# Patient Record
Sex: Female | Born: 1950
Health system: Southern US, Community
[De-identification: ages and names within clinical notes are randomized; demographics above are authoritative.]

## PROBLEM LIST (undated history)

## (undated) DIAGNOSIS — M79672 Pain in left foot: Secondary | ICD-10-CM

## (undated) DIAGNOSIS — F419 Anxiety disorder, unspecified: Secondary | ICD-10-CM

## (undated) DIAGNOSIS — M542 Cervicalgia: Secondary | ICD-10-CM

## (undated) DIAGNOSIS — E785 Hyperlipidemia, unspecified: Secondary | ICD-10-CM

## (undated) DIAGNOSIS — R112 Nausea with vomiting, unspecified: Secondary | ICD-10-CM

## (undated) DIAGNOSIS — Z9889 Other specified postprocedural states: Secondary | ICD-10-CM

## (undated) DIAGNOSIS — Z923 Personal history of irradiation: Secondary | ICD-10-CM

## (undated) DIAGNOSIS — G5602 Carpal tunnel syndrome, left upper limb: Secondary | ICD-10-CM

## (undated) DIAGNOSIS — M1990 Unspecified osteoarthritis, unspecified site: Secondary | ICD-10-CM

## (undated) DIAGNOSIS — M79671 Pain in right foot: Secondary | ICD-10-CM

## (undated) DIAGNOSIS — G8929 Other chronic pain: Secondary | ICD-10-CM

## (undated) DIAGNOSIS — M19011 Primary osteoarthritis, right shoulder: Secondary | ICD-10-CM

## (undated) DIAGNOSIS — M25519 Pain in unspecified shoulder: Secondary | ICD-10-CM

## (undated) DIAGNOSIS — K219 Gastro-esophageal reflux disease without esophagitis: Secondary | ICD-10-CM

## (undated) DIAGNOSIS — J309 Allergic rhinitis, unspecified: Secondary | ICD-10-CM

## (undated) DIAGNOSIS — E119 Type 2 diabetes mellitus without complications: Secondary | ICD-10-CM

## (undated) DIAGNOSIS — C50919 Malignant neoplasm of unspecified site of unspecified female breast: Secondary | ICD-10-CM

## (undated) DIAGNOSIS — R911 Solitary pulmonary nodule: Secondary | ICD-10-CM

## (undated) DIAGNOSIS — F329 Major depressive disorder, single episode, unspecified: Secondary | ICD-10-CM

## (undated) DIAGNOSIS — F319 Bipolar disorder, unspecified: Secondary | ICD-10-CM

## (undated) DIAGNOSIS — R51 Headache: Secondary | ICD-10-CM

## (undated) DIAGNOSIS — F32A Depression, unspecified: Secondary | ICD-10-CM

## (undated) DIAGNOSIS — I10 Essential (primary) hypertension: Secondary | ICD-10-CM

## (undated) DIAGNOSIS — C3490 Malignant neoplasm of unspecified part of unspecified bronchus or lung: Secondary | ICD-10-CM

## (undated) HISTORY — DX: Hyperlipidemia, unspecified: E78.5

## (undated) HISTORY — PX: TONSILLECTOMY: SUR1361

## (undated) HISTORY — DX: Solitary pulmonary nodule: R91.1

## (undated) HISTORY — DX: Malignant neoplasm of unspecified site of unspecified female breast: C50.919

## (undated) HISTORY — PX: EYE SURGERY: SHX253

## (undated) HISTORY — PX: TUBAL LIGATION: SHX77

## (undated) HISTORY — DX: Depression, unspecified: F32.A

## (undated) HISTORY — DX: Gastro-esophageal reflux disease without esophagitis: K21.9

## (undated) HISTORY — DX: Type 2 diabetes mellitus without complications: E11.9

## (undated) HISTORY — PX: OTHER SURGICAL HISTORY: SHX169

## (undated) HISTORY — DX: Allergic rhinitis, unspecified: J30.9

## (undated) HISTORY — DX: Bipolar disorder, unspecified: F31.9

## (undated) HISTORY — DX: Major depressive disorder, single episode, unspecified: F32.9

## (undated) HISTORY — DX: Anxiety disorder, unspecified: F41.9

---

## 2002-08-14 DIAGNOSIS — C50919 Malignant neoplasm of unspecified site of unspecified female breast: Secondary | ICD-10-CM

## 2002-08-14 DIAGNOSIS — Z923 Personal history of irradiation: Secondary | ICD-10-CM

## 2002-08-14 HISTORY — DX: Personal history of irradiation: Z92.3

## 2002-08-14 HISTORY — DX: Malignant neoplasm of unspecified site of unspecified female breast: C50.919

## 2003-08-15 HISTORY — PX: BREAST SURGERY: SHX581

## 2005-03-16 ENCOUNTER — Emergency Department (HOSPITAL_COMMUNITY): Admission: EM | Admit: 2005-03-16 | Discharge: 2005-03-16 | Payer: Self-pay | Admitting: Emergency Medicine

## 2005-06-07 ENCOUNTER — Ambulatory Visit: Payer: Self-pay | Admitting: *Deleted

## 2006-05-14 ENCOUNTER — Ambulatory Visit: Payer: Self-pay | Admitting: Gastroenterology

## 2006-05-29 ENCOUNTER — Ambulatory Visit: Payer: Self-pay | Admitting: Gastroenterology

## 2006-05-29 ENCOUNTER — Ambulatory Visit (HOSPITAL_COMMUNITY): Admission: RE | Admit: 2006-05-29 | Discharge: 2006-05-29 | Payer: Self-pay | Admitting: Gastroenterology

## 2006-09-07 ENCOUNTER — Emergency Department (HOSPITAL_COMMUNITY): Admission: EM | Admit: 2006-09-07 | Discharge: 2006-09-07 | Payer: Self-pay | Admitting: Emergency Medicine

## 2006-09-13 ENCOUNTER — Inpatient Hospital Stay (HOSPITAL_COMMUNITY): Admission: AD | Admit: 2006-09-13 | Discharge: 2006-09-17 | Payer: Self-pay | Admitting: Family Medicine

## 2006-09-20 ENCOUNTER — Ambulatory Visit: Payer: Self-pay | Admitting: Gastroenterology

## 2006-09-21 ENCOUNTER — Ambulatory Visit: Payer: Self-pay | Admitting: Cardiovascular Disease

## 2006-09-28 ENCOUNTER — Encounter (INDEPENDENT_AMBULATORY_CARE_PROVIDER_SITE_OTHER): Payer: Self-pay | Admitting: *Deleted

## 2006-09-28 ENCOUNTER — Ambulatory Visit (HOSPITAL_COMMUNITY): Admission: RE | Admit: 2006-09-28 | Discharge: 2006-09-28 | Payer: Self-pay | Admitting: Gastroenterology

## 2006-09-28 ENCOUNTER — Ambulatory Visit: Payer: Self-pay | Admitting: Gastroenterology

## 2006-10-18 ENCOUNTER — Ambulatory Visit: Payer: Self-pay | Admitting: Gastroenterology

## 2007-03-14 ENCOUNTER — Encounter (HOSPITAL_COMMUNITY): Admission: RE | Admit: 2007-03-14 | Discharge: 2007-04-13 | Payer: Self-pay | Admitting: Oncology

## 2007-03-14 ENCOUNTER — Ambulatory Visit (HOSPITAL_COMMUNITY): Payer: Self-pay | Admitting: Oncology

## 2007-05-02 ENCOUNTER — Ambulatory Visit (HOSPITAL_COMMUNITY): Admission: RE | Admit: 2007-05-02 | Discharge: 2007-05-02 | Payer: Self-pay | Admitting: Orthopaedic Surgery

## 2007-05-29 ENCOUNTER — Ambulatory Visit: Payer: Self-pay | Admitting: Gastroenterology

## 2007-08-15 DIAGNOSIS — R911 Solitary pulmonary nodule: Secondary | ICD-10-CM

## 2007-08-15 HISTORY — DX: Solitary pulmonary nodule: R91.1

## 2007-09-21 ENCOUNTER — Emergency Department (HOSPITAL_COMMUNITY): Admission: EM | Admit: 2007-09-21 | Discharge: 2007-09-21 | Payer: Self-pay | Admitting: Emergency Medicine

## 2007-09-25 ENCOUNTER — Ambulatory Visit (HOSPITAL_COMMUNITY): Admission: RE | Admit: 2007-09-25 | Discharge: 2007-09-25 | Payer: Self-pay | Admitting: Internal Medicine

## 2007-12-03 ENCOUNTER — Ambulatory Visit (HOSPITAL_COMMUNITY): Admission: RE | Admit: 2007-12-03 | Discharge: 2007-12-03 | Payer: Self-pay | Admitting: Family Medicine

## 2007-12-04 ENCOUNTER — Ambulatory Visit: Payer: Self-pay | Admitting: Cardiovascular Disease

## 2007-12-04 ENCOUNTER — Ambulatory Visit (HOSPITAL_COMMUNITY): Admission: RE | Admit: 2007-12-04 | Discharge: 2007-12-04 | Payer: Self-pay | Admitting: Family Medicine

## 2007-12-12 ENCOUNTER — Encounter (HOSPITAL_COMMUNITY): Admission: RE | Admit: 2007-12-12 | Discharge: 2008-01-11 | Payer: Self-pay | Admitting: Cardiovascular Disease

## 2007-12-12 ENCOUNTER — Ambulatory Visit: Payer: Self-pay | Admitting: Cardiology

## 2008-01-09 ENCOUNTER — Ambulatory Visit: Payer: Self-pay | Admitting: Gastroenterology

## 2008-06-18 ENCOUNTER — Ambulatory Visit: Payer: Self-pay | Admitting: Gastroenterology

## 2008-06-23 ENCOUNTER — Ambulatory Visit (HOSPITAL_COMMUNITY): Admission: RE | Admit: 2008-06-23 | Discharge: 2008-06-23 | Payer: Self-pay | Admitting: Family Medicine

## 2008-07-02 ENCOUNTER — Ambulatory Visit (HOSPITAL_COMMUNITY): Admission: RE | Admit: 2008-07-02 | Discharge: 2008-07-02 | Payer: Self-pay | Admitting: Family Medicine

## 2008-07-14 ENCOUNTER — Ambulatory Visit (HOSPITAL_COMMUNITY): Admission: RE | Admit: 2008-07-14 | Discharge: 2008-07-14 | Payer: Self-pay | Admitting: Family Medicine

## 2008-07-24 ENCOUNTER — Ambulatory Visit (HOSPITAL_COMMUNITY): Admission: RE | Admit: 2008-07-24 | Discharge: 2008-07-24 | Payer: Self-pay | Admitting: Family Medicine

## 2008-07-29 ENCOUNTER — Ambulatory Visit: Payer: Self-pay | Admitting: Thoracic Surgery

## 2008-09-30 ENCOUNTER — Emergency Department (HOSPITAL_COMMUNITY): Admission: EM | Admit: 2008-09-30 | Discharge: 2008-09-30 | Payer: Self-pay | Admitting: Emergency Medicine

## 2008-10-28 ENCOUNTER — Ambulatory Visit (HOSPITAL_COMMUNITY): Admission: RE | Admit: 2008-10-28 | Discharge: 2008-10-28 | Payer: Self-pay | Admitting: Thoracic Surgery

## 2008-10-28 ENCOUNTER — Ambulatory Visit: Payer: Self-pay | Admitting: Thoracic Surgery

## 2008-12-15 DIAGNOSIS — K219 Gastro-esophageal reflux disease without esophagitis: Secondary | ICD-10-CM

## 2008-12-15 DIAGNOSIS — C50919 Malignant neoplasm of unspecified site of unspecified female breast: Secondary | ICD-10-CM | POA: Insufficient documentation

## 2008-12-15 DIAGNOSIS — K2990 Gastroduodenitis, unspecified, without bleeding: Secondary | ICD-10-CM

## 2008-12-15 DIAGNOSIS — D259 Leiomyoma of uterus, unspecified: Secondary | ICD-10-CM | POA: Insufficient documentation

## 2008-12-15 DIAGNOSIS — E785 Hyperlipidemia, unspecified: Secondary | ICD-10-CM

## 2008-12-15 DIAGNOSIS — F419 Anxiety disorder, unspecified: Secondary | ICD-10-CM

## 2008-12-15 DIAGNOSIS — R143 Flatulence: Secondary | ICD-10-CM

## 2008-12-15 DIAGNOSIS — E7800 Pure hypercholesterolemia, unspecified: Secondary | ICD-10-CM

## 2008-12-15 DIAGNOSIS — R109 Unspecified abdominal pain: Secondary | ICD-10-CM | POA: Insufficient documentation

## 2008-12-15 DIAGNOSIS — R141 Gas pain: Secondary | ICD-10-CM | POA: Insufficient documentation

## 2008-12-15 DIAGNOSIS — R142 Eructation: Secondary | ICD-10-CM

## 2008-12-15 DIAGNOSIS — K2970 Gastritis, unspecified, without bleeding: Secondary | ICD-10-CM | POA: Insufficient documentation

## 2008-12-15 DIAGNOSIS — K5900 Constipation, unspecified: Secondary | ICD-10-CM | POA: Insufficient documentation

## 2008-12-15 DIAGNOSIS — Z8719 Personal history of other diseases of the digestive system: Secondary | ICD-10-CM | POA: Insufficient documentation

## 2008-12-15 DIAGNOSIS — F329 Major depressive disorder, single episode, unspecified: Secondary | ICD-10-CM | POA: Insufficient documentation

## 2008-12-16 ENCOUNTER — Ambulatory Visit: Payer: Self-pay | Admitting: Gastroenterology

## 2009-02-09 ENCOUNTER — Ambulatory Visit: Payer: Self-pay | Admitting: Internal Medicine

## 2009-03-10 ENCOUNTER — Ambulatory Visit: Payer: Self-pay | Admitting: Gastroenterology

## 2009-03-11 DIAGNOSIS — K649 Unspecified hemorrhoids: Secondary | ICD-10-CM | POA: Insufficient documentation

## 2009-08-16 ENCOUNTER — Encounter (INDEPENDENT_AMBULATORY_CARE_PROVIDER_SITE_OTHER): Payer: Self-pay | Admitting: *Deleted

## 2009-09-07 ENCOUNTER — Ambulatory Visit: Payer: Self-pay | Admitting: Gastroenterology

## 2009-09-08 ENCOUNTER — Encounter (INDEPENDENT_AMBULATORY_CARE_PROVIDER_SITE_OTHER): Payer: Self-pay | Admitting: *Deleted

## 2009-09-20 ENCOUNTER — Encounter (INDEPENDENT_AMBULATORY_CARE_PROVIDER_SITE_OTHER): Payer: Self-pay | Admitting: *Deleted

## 2009-09-27 ENCOUNTER — Telehealth (INDEPENDENT_AMBULATORY_CARE_PROVIDER_SITE_OTHER): Payer: Self-pay | Admitting: *Deleted

## 2009-10-15 ENCOUNTER — Encounter: Payer: Self-pay | Admitting: Gastroenterology

## 2009-10-26 ENCOUNTER — Ambulatory Visit: Payer: Self-pay | Admitting: Gastroenterology

## 2009-10-26 ENCOUNTER — Ambulatory Visit (HOSPITAL_COMMUNITY): Admission: RE | Admit: 2009-10-26 | Discharge: 2009-10-26 | Payer: Self-pay | Admitting: Gastroenterology

## 2009-11-17 ENCOUNTER — Ambulatory Visit: Payer: Self-pay | Admitting: Gastroenterology

## 2010-01-05 ENCOUNTER — Ambulatory Visit (HOSPITAL_COMMUNITY): Admission: RE | Admit: 2010-01-05 | Discharge: 2010-01-05 | Payer: Self-pay | Admitting: Family Medicine

## 2010-01-12 ENCOUNTER — Ambulatory Visit (HOSPITAL_COMMUNITY): Admission: RE | Admit: 2010-01-12 | Discharge: 2010-01-12 | Payer: Self-pay | Admitting: Family Medicine

## 2010-04-07 ENCOUNTER — Emergency Department (HOSPITAL_COMMUNITY): Admission: EM | Admit: 2010-04-07 | Discharge: 2010-04-07 | Payer: Self-pay | Admitting: Emergency Medicine

## 2010-04-27 ENCOUNTER — Ambulatory Visit: Payer: Self-pay | Admitting: Gastroenterology

## 2010-04-27 DIAGNOSIS — K625 Hemorrhage of anus and rectum: Secondary | ICD-10-CM

## 2010-09-04 ENCOUNTER — Encounter: Payer: Self-pay | Admitting: Family Medicine

## 2010-09-04 ENCOUNTER — Encounter: Payer: Self-pay | Admitting: Thoracic Surgery

## 2010-09-04 ENCOUNTER — Encounter (HOSPITAL_COMMUNITY): Payer: Self-pay | Admitting: Oncology

## 2010-09-13 NOTE — Letter (Signed)
Summary: Appointment Reminder  [facility]  [address]   [place], [place] [address]   Phone: [phone]  Fax: [phone]       August 16, 2009   Deborah Mullins [address] [place], [place]  [address] September 15, 1950    Dear [name],  We have been unable to reach you by phone to schedule a follow up   appointment that was recommended for you by Dr. Darrick Penna. It is very   important that we reach you to schedule an appointment. We hope that you  allow [place] to participate in your health care needs. Please contact us at  [phone] at your earliest convenience to schedule your appointment.  Sincerely,    [facility] R. Roetta Sessions, M.D.    Kassie Mends, M.D. Lorenza Burton, FNP-BC    Tana Coast, PA-C Phone: [phone]    Fax: [phone]

## 2010-09-13 NOTE — Assessment & Plan Note (Signed)
Summary: RECTAL BLEEDING   Visit Type:  Follow-up Visit Primary Care Deborah Mullins:  Deborah Mullins, M.D.   Chief Complaint:  hemorrhoids/BRBPR.  History of Present Illness: Has bleeding 1-2x and it was a little. No pain in belly. Gained weight. Having fatigue and a little depression. Significant other passed away in APR 01/09/2010. L knee bothers her. No hard stools. Lifting heavy items out in the yard. JUST RECYCLED 300 LBS AND GOT $30.  Current Medications (verified): 1)  Dulcolax Stool Softener 100 Mg Caps (Docusate Sodium) .... Take 1 Capsule By Mouth At Bedtime 2)  Zantac 150 Mg Tabs (Ranitidine Hcl) .... Take 1 Tablet By Mouth Once A Day 3)  B 6 .... Take 1 Tablet By Mouth Once A Day 4)  Paroxetine Hcl 30 Mg Tabs (Paroxetine Hcl) .... Take 2 Daily 5)  Simvastatin 40 Mg Tabs (Simvastatin) .... Take 1 Tablet By Mouth Once A Day 6)  Hydrochlorothiazide 25 Mg Tabs (Hydrochlorothiazide) .... Once Daily 7)  Vitamin C Cr 500 Mg Cr-Caps (Ascorbic Acid) .... Once Daily 8)  Calcium Plus Vitamin D 600-100 Mg-Unit Caps (Calcium Carbonate-Vitamin D) .... Once Daily 9)  Vicodin 5-500 Mg Tabs (Hydrocodone-Acetaminophen) .... As Needed 10)  Melatonin 3 Mg Tabs (Melatonin) .... At Bedtime As Needed 11)  Dexilant 60 Mg Cpdr (Dexlansoprazole) .... Take 1 Tablet By Mouth Once A Day  Allergies (verified): 1)  ! Pcn  Past History:  Past Medical History: Constipation **TCS 01/09/06 Gastritis **EGD 01/10/07 External hemorrhoids Internal Hemorrhoids **Endoscopic hemorrhoidal banding-MAR 2011 RLL mass: BENIGN    - CT Chest 10/29/07, 07/14/08, 12/04/07 becoming much more dense serially    - neg PET 12/09 GERD Fibroids Depression  Vital Signs:  Patient profile:   60 year old female Height:      64 inches Weight:      148.50 pounds BMI:     25.58 Temp:     98.0 degrees F oral Pulse rate:   80 / minute BP sitting:   120 / 82  (left arm) Cuff size:   regular  Vitals Entered By: Cloria Spring LPN (April 27, 2010 4:09 PM)  Physical Exam  General:  Well developed, well nourished, no acute distress. Head:  Normocephalic and atraumatic. Eyes:  PERRL, no icterus. Lungs:  Clear throughout to auscultation. Heart:  Regular rate and rhythm; no murmurs. Abdomen:  Soft, nontender and nondistended. Normal bowel sounds.  Impression & Recommendations:  Problem # 1:  RECTAL BLEEDING (ICD-569.3) Assessment Deteriorated  Having breakthrough bleeding from hemorrhoids due to heavylifting and also c/o fatigue. Anusol HC 1 pr two times a day for 10 days. Avoid constipation and heavylifting. CBC. OPV in APR or MAY 2011-01-10. If has significant bleeding, would proceed with surgey referral.  CC: PCP  Orders: T-CBC w/Diff ([id number]) Prescriptions: ANUSOL-HC 25 MG SUPP (HYDROCORTISONE ACETATE) 1 pr two times a day for 10 days  #20 x 1   Entered and Authorized by:   West Bali MD   Signed by:   West Bali MD on 04/27/2010   Method used:   Electronically to        [facility] [phone]* (retail)       [address]       [place], [place]  [address]       Ph: [phone] or [phone]       Fax: [phone]   RxID:   [id number]   Appended Document: RECTAL BLEEDING F/U  OPV APRIL 2012 IS IN THE COMPUTER  Appended Document: Orders Update    Clinical Lists Changes  Orders: Added new Service order of Est. Patient Level IV ([address]) - Signed

## 2010-09-13 NOTE — Letter (Signed)
Summary: [facility]  [facility]  [address]   [place], [place] [address]   Phone: [phone]  Fax: [phone]     September 20, 2009   [facility] [address] [place], [place]  [address] 05-Jul-1951  Dear Deborah Mullins,  During your last appointment, your doctor requested you have A Flex Sigmoidoscopy.  Our records indicate you have not had this done.  Remember it is very important to follow your doctor's instructions.  Please have this done as soon as possible.  If you have questions regarding this appointment, please call our office and we can reschedule this for you.  It is important that patients and their doctor work together in the management and treatment of their health care.  If you have already had your test done, please disregard this letter.  Thank you,    Ave Filter  [facility] Ph: [phone]   Fax: [phone]

## 2010-09-13 NOTE — Letter (Signed)
Summary: Scheduled Appointment  [facility]  [address]   [place], [place] [address]   Phone: [phone]  Fax: [phone]    September 08, 2009   Dear: [name]            DOB: 09/10/1950    I have been instructed to schedule you an appointment in our office.  Your appointment is as follows:   Date: FEB 22,2011     Time: 230 PM     Please be here 15 minutes early.   Provider: DR FIELDS    Please contact the office if you need to reschedule this appointment for a more convenient time.   Thank you,    [facility] Ph: [phone]   Fax: [phone]

## 2010-09-13 NOTE — Letter (Signed)
Summary: FLEX SIG ORDER  FLEX SIG ORDER   Imported By: Ave Filter 10/15/2009 12:08:34  _____________________________________________________________________  External Attachment:    Type:   Image     Comment:   External Document

## 2010-09-13 NOTE — Assessment & Plan Note (Signed)
Summary: hemorrhoids/rectal bleeding   Visit Type:  Follow-up Visit Primary Care Provider:  Almedia Balls, NP-C  Chief Complaint:  follow up.  History of Present Illness: Husband is real sick, bad liver and kidneys. Cared [facility] at [facility] Didn't have surgery because they determined she didn't have cancer. No straining with BMs. Bleeding and swollen hemorrhoids and pushes back in but still painful. Seeing BRBPR 1-2x/week. Pain inrectum: 1-2x/week. Dealing with 'rhoids for over 1 year and feels not responding to suppositories.  Rhoids don't fall out and stay out all the time. No ASA, BC, or Goodys, Ibuprofen, Motrin, or Aleve. VICODIN binds her up.  Current Medications (verified): 1)  Dulcolax Stool Softener 100 Mg Caps (Docusate Sodium) .... Take 1 Capsule By Mouth At Bedtime 2)  Zantac 150 Mg Tabs (Ranitidine Hcl) .... Take 1 Tablet By Mouth Once A Day 3)  B 6 .... Take 1 Tablet By Mouth Once A Day 4)  Multivitamins  Tabs (Multiple Vitamin) .... Take 1 Tablet By Mouth Once A Day 5)  Paroxetine Hcl 30 Mg Tabs (Paroxetine Hcl) .... Take 2 Daily 6)  Simvastatin 40 Mg Tabs (Simvastatin) .... Take 1 Tablet By Mouth Once A Day 7)  Kapidex 60 Mg Cpdr (Dexlansoprazole) .... Once Daily 8)  Hydrochlorothiazide 25 Mg Tabs (Hydrochlorothiazide) .... Once Daily 9)  Vitamin C Cr 500 Mg Cr-Caps (Ascorbic Acid) .... Once Daily 10)  Calcium Plus Vitamin D 600-100 Mg-Unit Caps (Calcium Carbonate-Vitamin D) .... Once Daily 11)  Cvs Gas Relief 125 Mg Caps (Simethicone) .... As Needed 12)  Vicodin 5-500 Mg Tabs (Hydrocodone-Acetaminophen) .... As Needed 13)  Melatonin 3 Mg Tabs (Melatonin) .... At Bedtime  Allergies: 1)  ! Pcn  Past History:  Past Medical History: RLL mass: BENIGN    - CT Chest 10/29/07, 07/14/08, 12/04/07 becoming much more dense serially    - neg PET 12/09 GERD Fibroids Constipation Depression Gastritis-EGD 2008 TCS 2007  Vital Signs:  Patient profile:   60 year old  female Height:      64 inches Weight:      158 pounds BMI:     27.22 Temp:     97.5 degrees F oral Pulse rate:   80 / minute BP sitting:   124 / 88  (left arm) Cuff size:   regular  Vitals Entered By: Hendricks Limes LPN (September 07, 2009 10:57 AM)  Physical Exam  General:  Well developed, well nourished, no acute distress. Head:  Normocephalic and atraumatic. Lungs:  Clear throughout to auscultation. Heart:  Regular rate and rhythm; no murmurs Abdomen:  Soft, nontender and nondistended. Normal bowel sounds. Rectal:  Grade I-II hemorrhoids. Multiple external hemorrhoids, no erythema, or edema.  Impression & Recommendations:  Problem # 1:  HEMORRHOIDS (ICD-455.6) Assessment Deteriorated Continued blleding in spite of medical theray. Pt has Grade I-II internal hemorrhoids. Plan FSig/hemorrhoidal banding next week. Explained procedure to pt and need for lighter sedation than a TCS because a pain assessment needs to be done prior to placing each band. Pt also aware it may take more than one session. OPV in  1 month.  CC: PCP  Other Orders: Est. Patient Level IV (53664)  Appended Document: hemorrhoids/rectal bleeding Pt is ready to schedule her procedure and states no change in her medicine or medical history since her office visit 09/07/09.  Appended Document: hemorrhoids/rectal bleeding Need s procedure on 3/18.

## 2010-09-13 NOTE — Assessment & Plan Note (Signed)
Summary: HEMORRHOIDAL BANDING   Visit Type:  Follow-up Visit Primary Care Provider:  Logan Bores, NP-c  Chief Complaint:  follow up from procedure.  History of Present Illness: Right after procedure: a little bleeding after band fell off. No bleeding since. No rectal pain, abd pain. Appetite: good.  BMs: good, 1-2x every AM. Husband in hospital throwing up blood at [facility]. Preceded by epistaxis.   Current Medications (verified): 1)  Dulcolax Stool Softener 100 Mg Caps (Docusate Sodium) .... Take 1 Capsule By Mouth At Bedtime 2)  Zantac 150 Mg Tabs (Ranitidine Hcl) .... Take 1 Tablet By Mouth Once A Day 3)  B 6 .... Take 1 Tablet By Mouth Once A Day 4)  Multivitamins  Tabs (Multiple Vitamin) .... Take 1 Tablet By Mouth Once A Day 5)  Paroxetine Hcl 30 Mg Tabs (Paroxetine Hcl) .... Take 2 Daily 6)  Simvastatin 40 Mg Tabs (Simvastatin) .... Take 1 Tablet By Mouth Once A Day 7)  Kapidex 60 Mg Cpdr (Dexlansoprazole) .... Once Daily 8)  Hydrochlorothiazide 25 Mg Tabs (Hydrochlorothiazide) .... Once Daily 9)  Vitamin C Cr 500 Mg Cr-Caps (Ascorbic Acid) .... Once Daily 10)  Calcium Plus Vitamin D 600-100 Mg-Unit Caps (Calcium Carbonate-Vitamin D) .... Once Daily 11)  Cvs Gas Relief 125 Mg Caps (Simethicone) .... As Needed 12)  Vicodin 5-500 Mg Tabs (Hydrocodone-Acetaminophen) .... As Needed 13)  Melatonin 3 Mg Tabs (Melatonin) .... At Bedtime  Allergies (verified): 1)  ! Pcn  Past History:  Past Medical History: Constipation External hemorrhoids Internal Hemorrhoids **Endoscopic hemorrhoidal banding-MAR 2011 RLL mass: BENIGN    - CT Chest 10/29/07, 07/14/08, 12/04/07 becoming much more dense serially    - neg PET 12/09 GERD Fibroids Depression Gastritis-EGD 2008 TCS 2007  Social History: Former smoker.  Quit in 1980.  Smoked 1 ppd x 15 years. Smokes marijuana daily No ETOH Widowed  Children Retired  Vital Signs:  Patient profile:   60 year old female Height:      64  inches Weight:      156 pounds BMI:     26.87 Temp:     98.2 degrees F oral Pulse rate:   64 / minute BP sitting:   138 / 92  (left arm) Cuff size:   regular  Vitals Entered By: Hendricks Limes LPN (November 18, 1322 8:28 AM)  Physical Exam  General:  Well developed, well nourished, no acute distress. Head:  Normocephalic and atraumatic. Mouth:  No deformity or lesions. Lungs:  Clear throughout to auscultation. Heart:  Regular rate and rhythm; no murmurs. Abdomen:  Soft, nontender and nondistended. Normal bowel sounds.  Impression & Recommendations:  Problem # 1:  HEMORRHOIDS (ICD-455.6) Assessment Improved  Will monitor for recurrent bleeding. If persistent rectal pain will need surgery referral for hemorrhoidectomy. OPV in 6 mos.  CC: PCP  Orders: Est. Patient Level II (40102)

## 2010-09-13 NOTE — Progress Notes (Signed)
  Phone Note Call from Patient   Summary of Call: Pt would like for you to call her back in the morning around 10am. [phone] or [phone]...she is wanting to schedule her procedure on the 18th this month. Initial call taken by: Diana Eves,  September 27, 2009 3:49 PM     Appended Document:  I called pt, no answer, [facility]

## 2010-10-04 ENCOUNTER — Encounter (INDEPENDENT_AMBULATORY_CARE_PROVIDER_SITE_OTHER): Payer: Self-pay | Admitting: *Deleted

## 2010-10-11 NOTE — Letter (Signed)
Summary: Recall Office Visit  [facility]  [address]   [place], [place] [address]   Phone: [phone]  Fax: [phone]      October 04, 2010   [facility] [address] [place], [place]  [address] 09/25/1950   Dear Deborah Mullins,   According to our records, it is time for you to schedule a follow-up office visit with [place].   At your convenience, please call [phone] to schedule an office visit. If you have any questions, concerns, or feel that this letter is in error, we would appreciate your call.   Sincerely,    Diana Eves  [facility] Ph: [phone]   Fax: [phone]

## 2010-10-26 ENCOUNTER — Emergency Department (HOSPITAL_COMMUNITY)
Admission: EM | Admit: 2010-10-26 | Discharge: 2010-10-27 | Disposition: A | Discharge status: Home / Self Care | Payer: No Typology Code available for payment source | Attending: Emergency Medicine | Admitting: Emergency Medicine

## 2010-10-26 DIAGNOSIS — K5289 Other specified noninfective gastroenteritis and colitis: Secondary | ICD-10-CM | POA: Insufficient documentation

## 2010-10-26 DIAGNOSIS — R112 Nausea with vomiting, unspecified: Secondary | ICD-10-CM | POA: Insufficient documentation

## 2010-10-27 LAB — BASIC METABOLIC PANEL
Calcium: 9.8 mg/dL (ref 8.4–10.5)
GFR calc Af Amer: >60 mL/min (ref >60)
GFR calc non Af Amer: 59 mL/min — ABNORMAL LOW (ref >60)
Glucose, Bld: 122 mg/dL — ABNORMAL HIGH (ref 70–99)
Sodium: 139 mEq/L (ref 135–145)

## 2010-10-27 LAB — URINALYSIS, ROUTINE W REFLEX MICROSCOPIC
Leukocytes, UA: NEGATIVE
Nitrite: NEGATIVE
Specific Gravity, Urine: >1.03 — ABNORMAL HIGH (ref 1.005–1.030)
Urobilinogen, UA: 0.2 mg/dL (ref 0.0–1.0)

## 2010-10-27 LAB — CBC
HCT: 42 % (ref 36.0–46.0)
Platelets: 274 10*3/uL (ref 150–400)
RDW: 13.5 % (ref 11.5–15.5)
WBC: 7.3 10*3/uL (ref 4.0–10.5)

## 2010-10-27 LAB — DIFFERENTIAL
Basophils Absolute: 0 10*3/uL (ref 0.0–0.1)
Eosinophils Relative: 0 % (ref 0–5)
Lymphocytes Relative: 13 % (ref 12–46)

## 2010-10-27 LAB — URINE MICROSCOPIC-ADD ON

## 2010-11-08 ENCOUNTER — Telehealth: Payer: Self-pay | Admitting: Gastroenterology

## 2010-11-08 DIAGNOSIS — K625 Hemorrhage of anus and rectum: Secondary | ICD-10-CM

## 2010-11-08 NOTE — Telephone Encounter (Signed)
Pt called wanting to schedule a TCS ASAP- She is having significant rectal bleeding and thinks it is coming from her hemorrhoids. Her sister will be in town until next WED so she would like to have this done on Monday or Tuesday. Is it ok to schedule her without having her come in for an office visit? Please advise

## 2010-11-15 NOTE — Telephone Encounter (Signed)
Called pt's  Home #: not a working number. Called cell phone: no VM set up yet. Pt called back. Pt  Having rectal bleeding for 1-3 mos. Nothing tried. Has been gardening. Sitting digging, planting flowers-lilies, and weeding. Pt needs General Surgery referral (Jenkins/Ziegler)-Dx; hemorrhoids, ASAP. Pt needs CBC. BMs: soft. At first straining to pass stool. Pt will restart stool softener. Pt already has Anusol HC 1 pr 1-2 times for 7 days. Don't sit on toilet for prolonged. If has urge to have a BM, don't wait. OPV in 3 mos, RE: rectal bleeding.

## 2010-11-15 NOTE — Telephone Encounter (Signed)
Pt has an appt with Dr Leticia Penna 11/17/10@1 :45pm..the patient is aware of appt.

## 2010-11-15 NOTE — Telephone Encounter (Signed)
Order faxed to lab.

## 2010-11-16 NOTE — Telephone Encounter (Signed)
Reminder in epic for fu in 3 months with [facility]

## 2010-12-06 ENCOUNTER — Emergency Department (HOSPITAL_COMMUNITY)
Admission: EM | Admit: 2010-12-06 | Discharge: 2010-12-07 | Disposition: A | Discharge status: Home / Self Care | Payer: No Typology Code available for payment source | Attending: Emergency Medicine | Admitting: Emergency Medicine

## 2010-12-06 DIAGNOSIS — R51 Headache: Secondary | ICD-10-CM | POA: Insufficient documentation

## 2010-12-06 DIAGNOSIS — Z853 Personal history of malignant neoplasm of breast: Secondary | ICD-10-CM | POA: Insufficient documentation

## 2010-12-06 DIAGNOSIS — E7800 Pure hypercholesterolemia, unspecified: Secondary | ICD-10-CM | POA: Insufficient documentation

## 2010-12-06 DIAGNOSIS — R070 Pain in throat: Secondary | ICD-10-CM | POA: Insufficient documentation

## 2010-12-06 DIAGNOSIS — I10 Essential (primary) hypertension: Secondary | ICD-10-CM | POA: Insufficient documentation

## 2010-12-06 DIAGNOSIS — J069 Acute upper respiratory infection, unspecified: Secondary | ICD-10-CM | POA: Insufficient documentation

## 2010-12-06 DIAGNOSIS — R05 Cough: Secondary | ICD-10-CM | POA: Insufficient documentation

## 2010-12-06 DIAGNOSIS — R059 Cough, unspecified: Secondary | ICD-10-CM | POA: Insufficient documentation

## 2010-12-06 DIAGNOSIS — J45909 Unspecified asthma, uncomplicated: Secondary | ICD-10-CM | POA: Insufficient documentation

## 2010-12-06 DIAGNOSIS — K219 Gastro-esophageal reflux disease without esophagitis: Secondary | ICD-10-CM | POA: Insufficient documentation

## 2010-12-06 DIAGNOSIS — Z79899 Other long term (current) drug therapy: Secondary | ICD-10-CM | POA: Insufficient documentation

## 2010-12-06 DIAGNOSIS — J3489 Other specified disorders of nose and nasal sinuses: Secondary | ICD-10-CM | POA: Insufficient documentation

## 2010-12-27 NOTE — Assessment & Plan Note (Signed)
NAME:  Deborah Mullins              CHART#:  [id number]   DATE:  06/18/2008                       DOB:  [birth year]   REFERRING PHYSICIAN:  Margarita Mail, PA-C.   PROBLEM LIST:  1. EGD February 2008-Gastritis  2. Gastroesophageal reflux disease.  3. Breast cancer treated with mastectomy, 33 treatments XRT in 2004.  4. Uterine fibroids.  5. Depression.  6. Constipation.  7. TCS 2007-normal   SUBJECTIVE:  The patient is a 60 year old female who has had problems  with constipation and bright red blood per rectum twice.  Her  hemorrhoids were very painful and engorged.  The symptoms were about a  month ago.  She used Anusol-HC and they got better.  She has been trying  to eat more fresh vegetables and fruit because her husband has been ill  and as a result, she has had more bloating.  She does use 1 Dulcolax at  nighttime to help with bowel movements.  She takes MiraLax when I  remember.  She did have a CT scan back in April, which showed some  vague densities in the right lower lobe and some low-density lesion of  the liver, spleen, and adnexal cyst.  She is scheduled to have a CT scan  in November 2009 and ultrasound in December 2009.   MEDICATIONS:  1. Prilosec 20 mg daily.  2. Stool softener.  3. Dulcolax 1 nightly.  4. Lunesta as needed.  5. Zantac 150 mg.  6. Enalapril.  7. B6.  8. Multivitamin.  9. Paroxetine.  10.Simvastatin.   OBJECTIVE/PHYSICAL EXAM:  VITAL SIGNS:  Weight 152 pounds (up 16 pounds  since May 2009), height 5 feet 3 inches, temperature 97.9, blood  pressure 118/86, and pulse 60.GENERAL:  She is in no apparent distress.  Alert and oriented x4.LUNGS:  Clear to auscultation  bilaterally.CARDIOVASCULAR:  Regular rhythm.  No murmur.  Normal S1 and  S2.ABDOMEN:  Bowel sounds are present, soft, nontender, nondistended.   ASSESSMENT:  The patient is a 60 year old female who has had a  hemorrhoidal flare, it is now resolved.  Thank you for allowing me to  see  the patient in consultation.  My recommendations follow.   RECOMMENDATIONS:  1. She is given Anusol-HC suppositories to use every 12 hours for 10      days if she has a flare.  She has no refills.  If she continues to      require Anusol-HC more than 3-4 times a year, then would recommend      hemorrhoidectomy.  2. Follow up appointment in 6 months.       Kassie Mends, M.D.  Electronically Signed     SM/MEDQ  D:  06/18/2008  T:  06/18/2008  Job:  [id number]   cc:   Patrica Duel, M.D.

## 2010-12-27 NOTE — Assessment & Plan Note (Signed)
[facility]                       [facility] OFFICE NOTE   Scherrie Gerlach                    MRN:          [id number]  DATE:12/04/2007                            DOB:          1950/10/30    HISTORY OF PRESENT ILLNESS:  [name] is seen today for yearly follow-  up and also referred back from Jimmy Footman and Dr. Sherwood Gambler.  The patient  has been having atypical chest pain.   She seems a bit frustrated.  She has been ill since pretty much  September 2008.  Since I last saw her, she has had continue shoulder  pain.  She had carpal tunnel surgery on the right arm by Dr. Hilda Lias  with minimal improvement.  She has had subsequent gastritis and Dr.  Cira Servant has done colonoscopy and upper EGD.  She is on Zantac.  She has  had a URI since February.  She has been on antibiotics, Mucinex and  other therapies and still feels that she has a cough and congestion.  There is no marked sputum production.  She apparently has had a chest x-  ray which did not show much.  She used to have a cortisol level and  chest CT.   She denies smoking, but has smoked pot in the past.   She has had some atypical chest pain, it is likely related to her cough  and URA that she has had for over a month.  It is sharp pain.  It is  under left breast.  She has had a previous mastectomy.  It is not  necessarily exertional.  There is no pleuritic component.  There is no  associated diaphoresis, syncope or palpitations.  She has had a  nonischemic Myoview in 2005 which was done for atypical chest pain.   Coronary risk factors include hypertension, recently treated and  hyperlipidemia also recently treated.   MEDICATIONS:  1. Vytorin 10/40.  2. Zantac 75 a day.  3. MiraLax.  4. Vasotec 10 a day.  5. Paxil 30 a day.  6. Vitamin B6.  7. Calcium.   PHYSICAL EXAMINATION:  GENERAL:  Exam is remarkable for bronchitic  appearing middle-aged white female with color care.  VITAL SIGNS:  Weight is 132, blood pressure is 120/80 pulse 76 and  regular, respiratory rate 16, afebrile.  HEENT:  Unremarkable.  NECK:  Carotids are without bruit.  No lymphadenopathy, thyromegaly, JVP  elevation.  LUNGS:  Clear with good diaphragmatic motion.  No wheezing.  HEART:  S1-S2 normal heart sounds.  PMI normal, status post mastectomy.  ABDOMEN:  Benign bowel sounds positive.  No AAA.  No bruit.  No  hepatosplenomegaly or hepatojugular reflux.  EXTREMITIES:  Distal pulses are intact.  No edema.  NEUROLOGICAL:  Nonfocal.  SKIN:  Warm and dry.  No muscle weakness.   STUDIES:  EKG is essentially normal.   IMPRESSION:  1. Atypical chest pain.  Follow-up stress Myoview next week.  The      patient has a bunch of other appointments this week that she needs      to keep.  2. Hypercholesterolemia.  Continue Vytorin 10/40, follow-up lipid and      liver profile in 6 months.  3. Hypertension, recently treated with Vasotec, continue low-salt      diet, blood pressure in good range.  4. History of gastritis.  Continue Zantac.  Follow-up with Dr. Cira Servant.  5. Patient's chest pain is atypical.  She needs to follow up with Dr.      Sherwood Gambler and Jimmy Footman regarding this prolonged QRI.  I did mention      to her that I thought that she had a lot of smoke on her clothes.      She said that it is smoking in the household and that she is      currently not smoking, but at some point it may be worthwhile to      check a nicotine level.     Noralyn Pick. Eden Emms, MD, [facility]  Electronically Signed    PCN/MedQ  DD: 12/04/2007  DT: 12/04/2007  Job #: [id number]

## 2010-12-27 NOTE — H&P (Signed)
NAME:  Deborah Mullins             ACCOUNT NO.:  [id number]   MEDICAL RECORD NO.:  [id number]          PATIENT TYPE:  AMB   LOCATION:  DAY                           FACILITY:  [facility]   PHYSICIAN:  J. Darreld Mclean, M.D. DATE OF BIRTH:  [birth year]   DATE OF ADMISSION:  DATE OF DISCHARGE:  LH                              HISTORY & PHYSICAL   CHIEF COMPLAINT:  Carpal tunnel.   Patient is a 60 year old female with pain and tenderness in both hands,  right greater than left.  She has had pain and tenderness in both hands  for several years.  This got progressively worse.  She saw Dr. Regino Schultze  recently and he had Dr. Benson Setting see the patient.  EMGs were done showing  bilateral carpal tunnel syndrome, moderate to severe on the right and  mild on the left.  She has not improved at all with conservative  treatment over time including anti-inflammatories, rest and splints.  She now desires carpal tunnel release.  I have explained to her the  risks and imponderables of the procedure. She appears to understand and  agrees to procedure as outlined.   PAST MEDICAL HISTORY:  Positive for  1. Hypertension.  2. Breast cancer.  3. Ulcer disease.   ALLERGIES:  She is allergic to PENICILLIN,   MEDICATIONS:  She takes  1. Vytorin 10/40 daily.  2. Clonidine 0.2 nightly.  3. Ambien CR 12.5 nightly.  4. Ranitidine 150 mg daily.  5. Loratadine nightly.  6. Simethicone.  7. ProAir.   She does not smoke and does not drink.   PAST SURGICAL HISTORY:  1. Status post C-section.  2. Tubal ligation.  3. Breast cancer surgery in 2004 with no residual.  4. Colonoscopy.   [place]  She is a widow.   FAMILY HISTORY:  Heart disease runs in the family.   PHYSICAL EXAMINATION:  VITAL SIGNS:  BP is 110/74, pulse 72, respiration  16, afebrile.  Height 5 feet 5 inches. 140 pounds.  GENERAL APPEARANCE:  Patient is alert, cooperative, and oriented.  HEENT:  Negative.  NECK:  Supple.  LUNGS:  Clear to P&A.  HEART:  Regular rhythm without murmur heard.  ABDOMEN:  Soft, nontender without masses.  EXTREMITIES:  Positive Phalen's, positive Tinel's on the right.  Positive Phalen's on the left and weakly positive Tinel's on the left  and positive.  She decreased sensation in medial and lateral  distribution of both hands, right greater than left.  CNS: Otherwise intact.  SKIN:  Intact.   IMPRESSION:  Bilateral carpal tunnel syndrome, right greater than left.   PLAN:  Open release of the right carpal tunnel volar carpal ligament.  Discussed with patient the planned procedure.  Labs are pending.                                            ______________________________  J. Darreld Mclean, M.D.     JWK/MEDQ  D:  05/01/2007  T:  05/01/2007  Job:  [id number]

## 2010-12-27 NOTE — Assessment & Plan Note (Signed)
NAMEMarland Mullins  [name]              CHART#:  [id number]   DATE:  01/09/2008                       DOB:  [birth year]   REFERRING PHYSICIAN:  Kirk Ruths, M.D.   PROBLEM LIST:  1. Gastritis on biopsy and EGD of February 2008.  2. Gastroesophageal reflux disease.  3. Breast cancer treated with mastectomy and 33 treatments of      radiation in 2004.  4. Uterine fibroid.  5. Depression.  6. Constipation.   SUBJECTIVE:  [name] is a 60 year old female who presents as a  return patient visit.  She was last seen in the clinic in October of  2008.  She was complaining of constipation and having painful  hemorrhoids.  In February of 2008 she began to have persistent left  breast and chest pain.  She was seen by cardiology and evaluated and  says that her stress test was fine.  She also saw Madelin Rear. Sherwood Gambler,  M.D. and Jimmy Footman, who ordered a CT scan and MRI and she had two cysts  on her spleen and her liver.  She said they had planned to repeat an  imaging study in 6 months.  She described a discomfort in her chest that  sounded like there was a gas bubble underneath her left breast.  At the  time she was taking 75 mg of Ranitidine daily.  She denies any nausea  and vomiting or problems swallowing.  Sometimes she does have shortness  of breath on exertion.  Her stress test did show that she is physically  deconditioned and has decreased exercise tolerance.  She is no longer  having pain in her chest after adding Ranitidine 150 mg once a day.   MEDICATIONS:  1. Vytorin.  2. Prilosec as needed.  3. MiraLax once everyday or every other day.  4. Stool softener at bedtime according to how hard her stools are.  5. Lunesta.  6. Zantac 150 mg daily.  7. Enalapril.  8. B6.  9. Multivitamin.  10.Paroxetine 30 mg daily.   OBJECTIVE:  VITAL SIGNS:  Weight 136 pounds (down 7 pounds since October  of 2008), height 5 feet 3 inches, BMI 24.1 (healthy), temperature 97.7,  blood  pressure 124/84, pulse 76.  GENERAL:  She is in no apparent  distress.  Alert and oriented x4.  LUNGS:  Clear to auscultation  bilaterally. CARDIOVASCULAR:  Regular rhythm with no murmur. ABDOMEN:  Bowel sounds are present, soft, nontender, and nondistended.  EXTREMITIES:  No cyanosis or edema.  NEUROLOGY:  She has no focal  neurologic deficits.   ASSESSMENT:  [name] is a 60 year old female who had intermittent  left chest discomfort in February which was likely secondary to  uncontrolled gastroesophageal reflux disease.  Thank you for allowing me  to see [name] in consultation.  My recommendations follow.   RECOMMENDATIONS:  1. I recommend that she switch from Ranitidine to omeprazole once      daily 30 minutes before breakfast to have more adequate control of      her GERD symptoms.  2. She has a follow-up appointment to see me in 6 months.       Kassie Mends, M.D.  Electronically Signed     SM/MEDQ  D:  01/09/2008  T:  01/09/2008  Job:  [id number]   cc:  Kirk Ruths, M.D.

## 2010-12-27 NOTE — Assessment & Plan Note (Signed)
NAMEMarland Mullins  [name]              CHART#:  [id number]   DATE:  05/29/2007                       DOB:  02/19/1951   REFERRING PHYSICIAN:  Karleen Hampshire, M.D.   PROBLEM LIST:  1. Chronic gastritis with abnormal CT scan in January 2008. EGD in      February 2008 showed gastritis with biopsies.  2. History of breast cancer treated with mastectomy and 33 treatments      of radiation in 2004.  3. Hyperlipidemia.  4. Gastroesophageal reflux disease.  5. Uterine fibroid.  6. Depression.  7. Chronic constipation.   SUBJECTIVE:  The patient is a 60 year old female who presents for her  return patient visit.  She recently had carpal tunnel surgery and was on  Codeine.  The Codeine bound her up.  She is now on Darvocet.  She  had a stool softener and MiraLax and now the constipation has been  relieved.  She is back to taking ibuprofen.  The constipation made her  hemorrhoids flare.  They are painful, but not necessarily bleeding.   MEDICATIONS:  1. Vytorin daily.  2. Darvocet as needed.  3. Mylanta as needed.  4. MiraLax as needed.  5. Ambien as needed.  6. Acid reducer 1-2 at bedtime.  7. Stool softener.  8. Clonidine 0.2 mg   OBJECTIVE:  Weight 143 pounds (up 5 pounds since March 2008), height 5  feet 3 inches.  Temperature 98.2, blood pressure 98/60, and pulse 80.  GENERAL:  She is in no apparent distress.  Alert and oriented x4.  LUNGS:  Clear to auscultation bilaterally.  CARDIOVASCULAR EXAM:  Regular rhythm.  No murmurs, normal S1, S2.  ABDOMEN:  Bowel sounds are present.  Soft, nontender, nondistended.  No  rebound or guarding.  EXTREMITIES:  She has a splint on her right wrist, hand and forearm.  NEURO:  She has no focal neurologic deficits.   ASSESSMENT:  The patient is a 60 year old female who has a hemorrhoidal  flare secondary to constipation induced by narcotics.  She has a history  of NSAID gastritis and is currently using antiinflammatory drugs.  She  also has  gastroesophageal reflux disease, which is controlled.  Thank  you for allowing me to see this patient in consultation.  My  recommendations follow.   RECOMMENDATIONS:  1. She was given her discharge instructions in writing.  She was asked      to avoid straining.  She was asked to add MiraLax twice daily.  She      was asked to drink at least 6-8 cups of water or juice a day.  2. She was asked to use Anusol-HC twice a day for 14 days.  She should      also add Colace 2-3 times a day to soften her stools.  3. She has a return patient visit to see me in 6 months.       Kassie Mends, M.D.  Electronically Signed     SM/MEDQ  D:  05/29/2007  T:  05/30/2007  Job:  [id number]   cc:   Tilda Burrow, M.D.  Kirk Ruths, M.D.

## 2010-12-27 NOTE — Op Note (Signed)
NAME:  Deborah Mullins             ACCOUNT NO.:  [id number]   MEDICAL RECORD NO.:  [id number]          PATIENT TYPE:  AMB   LOCATION:  DAY                           FACILITY:  [facility]   PHYSICIAN:  J. Darreld Mclean, M.D. DATE OF BIRTH:  [birth year]   DATE OF PROCEDURE:  05/02/2007  DATE OF DISCHARGE:                               OPERATIVE REPORT   PREOPERATIVE DIAGNOSIS:  Carpal tunnel syndrome, right.   POSTOPERATIVE DIAGNOSIS:  Carpal tunnel syndrome, right.   PROCEDURE:  Open release of right volar carpal ligament, saline  neurolysis epineurotomy right median nerve.   ANESTHESIA:  Bier block.   SURGEON:  J. Darreld Mclean, M.D.   TOURNIQUET TIME:  Please refer anesthesia record.   Volar plaster splint applied.  No drains.   INDICATIONS:  The patient 60 year old female with pain and tenderness in  both hands right greater than left.  EMG showed carpal tunnel syndrome,  worse on the right than the left.  Has tried conservative treatment  without success.  She is progressively getting worse.  Surgery has been  advised.  The risks and imponderables discussed with the patient.  She  appears to understand.   DESCRIPTION OF PROCEDURE:  The patient was seen in the holding area,  right hand identified as correct surgical site.  I placed a mark on the  right wrist.  She placed a mark on the right wrist.  She was brought to  the operating room, given Bier block anesthesia while supine.  Tourniquet was inflated in the usual fashion.  Line for incision was  made.  She was prepped and draped in the usual fashion.  Had a time-out  identifying [name] as the patient, right wrist as correct surgical  site.  Careful dissection, the median nerve identified proximally.  Vessel loop placed around the nerve.  A grooved direct was placed within  the carpal tunnel space.  Volar carpal ligament was incised.  Retinaculum cut proximally.  Specimen was volar carpal in the sent to  pathology.   Saline neurolysis epineurotomy carried out.  Wound  inspected, nerve looked good.  The wound reapproximated using 3-0 nylon  interrupted vertical mattress manner.  Sterile dressing applied.  Sheet  cotton applied.  Sheet cotton cut dorsally, volar  plaster splint applied.  Ace bandage applied loosely and the patient  tolerated the procedure well.  Will be given prescription for Vicodin ES  for pain.  Will see in the office approximately 10 days to 2 weeks.  Any  difficulties she is to contact me through the office or hospital beeper  system.           ______________________________  [name]. Darreld Mclean, M.D.     JWK/MEDQ  D:  05/02/2007  T:  05/02/2007  Job:  [id number]   cc:   Kirk Ruths, M.D.  Fax: [phone]

## 2010-12-27 NOTE — Letter (Signed)
October 28, 2008   Kirk Ruths, MD  [address]  [place], [place] [address]   Re:  Deborah Mullins             DOB:  November 16, 1950   Dear Dr. Regino Schultze:   I saw the patient back today.  Her blood pressure was 130/87, pulse 84,  respirations 18, and sats were 96%.  Lungs were clear to auscultation  and percussion.  I was concerned about her right lower lobe ground-glass  nodule when the CT scan done today showed that it is still about 15 mm  in size, but this is slightly increased with the previous findings and I  still feel that she needs to have a right lower lobe superior  segmentectomy.  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  I think she will finally decide to let us do  this.  She will call back when she wants to schedule this as she has got  some other medical problems with her husband.  I appreciate the  opportunity of seeing the patient.   Sincerely,   Ines Bloomer, M.D.  Electronically Signed   DPB/MEDQ  D:  10/28/2008  T:  10/29/2008  Job:  [id number]

## 2010-12-27 NOTE — Letter (Signed)
July 29, 2008   Deborah Ruths, MD  [address]  [place], [place] [address]   Re:  [name]             DOB:  [birth year]   Dear Dr. Regino Schultze:   I saw the patient today.  I appreciate the referral.  This 60 year old  patient is a nonsmoker.  Apparently in April, she was found to have a  right lower lobe lesion.  A followup CT scan was done in July, which  also showed a right lower lobe lesion that was of the same size,  probably a little more dense in size.  She underwent a PET scan, which  showed no uptake, but this is worrisome for a ground-glass opacity,  which would go along with bronchoalveolar cancer.  She has a history of  having breast cancer in the past and has known to have some splenic  lesions and the left adnexa lesion, which a biopsy was done of the left  adnexal mass.  A PET scan was ordered and showed no uptake in either  lesion.  The 3.5 cm adnexal cyst was unchanged.  Her pulmonary function  test showed an FVC of 3.10 and FEV-1 of 2.53.  She has no hemoptysis,  fever, chills, or excessive sputum.   MEDICATIONS:  Vytorin 10/50 one a day, simvastatin 40 mg a day, Paxil 50  mg a day, Vasotec 10 mg a day, Lunesta 3 mg a day, a stool softener,  MiraLax, omeprazole 20 mg a day, and iron.   PAST MEDICAL HISTORY:  She has hypercholesterolemia and hypertension.   FAMILY HISTORY:  Her mother had a benign lesion removed from a lung.   SOCIAL HISTORY:  She is a widow.  She has 1 child.  She quit smoking 30  years ago.  Does not drink alcohol on a regular basis.   REVIEW OF SYSTEMS:  Vital Signs:  She has had some weight gain.  She is  150 pounds.  She is 5 feet 4 inches.  General:  She has some chest pain,  shortness of breath.  No angina or atrial fibrillation.  Pulmonary:  Negative.  GI:  She has had some previous melena, reflux, abdominal  pain, and constipation.  GU:  She has had no kidney disease, but has  frequent urination.  Vascular:  No  claudication, DVT, or TIAs.  Neurological:  She has had headaches.  No seizures or blackouts.  Musculoskeletal:  She has arthritis, joint pain, and muscular pain.  Psychiatric:  No depression or nervous.  Eye/ENT:  No change in eyesight  or hearing.  Hematological:  No problems with bleeding or clotting  disorders.   PHYSICAL EXAMINATION:  GENERAL:  She is a well-developed Caucasian  female, in no acute distress.  VITAL SIGNS:  Her blood pressure is 127/83, pulse 71, respirations 18,  and sats were 98%.  CHEST:  Clear to auscultation and percussion.  HEART:  Regular sinus rhythm.  No murmurs.  ABDOMEN:  Soft.  No hepatosplenomegaly.  EXTREMITIES:  Pulses are 2+.  There is no clubbing or edema.  NEUROLOGIC:  She is oriented x3.  Sensory and motor intact.  Cranial  nerves are intact.   This is a very difficult situation and that the lesion does look like a  bronchoalveolar cancer, but giving no uptake and the stability over the  6 months. We did point toward that this being a possible inflammatory  process, however bronchoalveolar cancer is  notorious revealing very slow  growing and given the ground-glass appearance and the possible  increasing soft tissue component.  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  I have recommended that she have a resection  of this, but she is somewhat hesitant, since her mother had a benign  lesion removed.  I thought the other options are needle biopsy or follow  up.  Right now, she shows the followup and we will get another CT scan  in 3 months.  I did tell her that if she changes her mind, I will see  her again.  I think she would benefit from a right VATS and right lower  lobe superior segmentectomy.  I appreciate the opportunity of seeing the  patient.   Sincerely.   Ines Bloomer, M.D.  Electronically Signed   DPB/MEDQ  D:  07/29/2008  T:  07/29/2008  Job:  [id number]

## 2010-12-30 NOTE — H&P (Signed)
NAMEGenevieve Mullins             ACCOUNT NO.:  [id number]   MEDICAL RECORD NO.:  [id number]          PATIENT TYPE:  INP   LOCATION:  A304                          FACILITY:  [facility]   PHYSICIAN:  Kirk Ruths, M.D.DATE OF BIRTH:  02/23/1951   DATE OF ADMISSION:  09/13/2006  DATE OF DISCHARGE:                              HISTORY & PHYSICAL   CHIEF COMPLAINT:  I am not getting better.   HISTORY OF PRESENT ILLNESS:  This 60 year old female was seen by her  primary care physician on September 11, 2006, with the complaint of a red,  hot, swollen right knee.  She had been seen in the emergency room a  couple of days prior and had been given Vicodin, which was not helping  with the redness and swelling in her knee.  She has no history of gout,  and the right knee had small central lesion approximately 1 mm and  scabbed, not open or with any exudate.  The anterior knee was  erythematous and tender.  She had full range of motion.  She was  prescribed Bactrim double strength 1 twice a day for 10 days and sent  home.  Two days later, on September 13, 2006, the patient had called the  office stating that she was not getting any better, and she was advised  to come in to be seen.  At that time, it was noted that the redness and  swelling had worsened, and the small lesion had opened.  At that time,  it was decided that she needed to be admitted for evaluation due to her  nonresponsiveness to the Bactrim.  She was afebrile at the time and  denied any other complaint.   PAST MEDICAL HISTORY:  Significant for breast cancer and hyperlipidemia.   SURGICAL HISTORY:  Lumpectomy in 2004 as well as radiation.  She has had  a C-section, tonsillectomy, and an abortion.   MEDICINES:  1. Vytorin 10/40 mg.  2. Omeprazole 20 mg.   ALLERGIES:  SHE IS ALLERGIC TO PENICILLIN.   SOCIAL HISTORY:  Married female, retired.  Denies alcohol or tobacco.  Occasional marijuana use.   REVIEW OF SYSTEMS:  Denies  fever, denies rash.  No joint pains or  myalgias, no dyspnea, no chest pain.   PHYSICAL EXAMINATION:  VITAL SIGNS:  Blood pressure 120/80.  Respirations 16.  Temperature on September 11, 2006, was 98.7.  Weight of  141.  GENERAL:  Not in acute distress.  She is alert and oriented.  HEENT AND NECK:  Normocephalic.  Neck supple.  CHEST:  Clear to auscultation bilaterally.  HEART:  Regular rate and rhythm.  No murmurs, rubs, or gallops.  ABDOMEN:  Soft, nontender.  EXTREMITIES:  Trace pedal edema noted on the right extremity, distal to  the proximal tibial tuberosity.  Bilateral knees:  Full range of motion,  no joint effusion.  SKIN:  Right knee with significant erythema on the prepatellar region  with an open lesion approximately 2 mm in circumference with bloody  serosanguineous exudate, very tender with mild prepatellar edema.  No  popliteal fossa tenderness or  joint tenderness laterally or medially.   ASSESSMENT:  1. Right knee pain.  2. Cellulitis.   PLAN:  Admit for IV therapy due to failure to respond to Bactrim.  Consult ortho.     ______________________________  Carmelina Dane, [facility]      Kirk Ruths, M.D.  Electronically Signed    SE/MEDQ  D:  09/13/2006  T:  09/14/2006  Job:  [id number]

## 2010-12-30 NOTE — Discharge Summary (Signed)
NAMEGenevieve Mullins             ACCOUNT NO.:  [id number]   MEDICAL RECORD NO.:  [id number]          PATIENT TYPE:  INP   LOCATION:  A304                          FACILITY:  [facility]   PHYSICIAN:  Kirk Ruths, M.D.DATE OF BIRTH:  Mar 17, 1951   DATE OF ADMISSION:  09/13/2006  DATE OF DISCHARGE:  02/04/2008LH                               DISCHARGE SUMMARY   DISCHARGE DIAGNOSIS:  Prepatellar cellulitis, right knee, secondary to  methicillin-resistant staphylococcus aureus.   HOSPITAL COURSE:  This female had been seen in the office 2 days before  admission with cellulitis of her right knee, was treated empirically  with Bactrim and cultures were obtained. Returned 2 days later with  worsening of her cellulitis and hint of effusion. She was admitted for  IV antibiotics and further evaluation. She was seen by Dr. Hilda Lias, and  soft tissue was aspirated of pustulous material. She was treated with  Cleocin IV with daily improvement in her cellulitis. Cultures were slow  to mature, finally growing methicillin-resistant staph which was  sensitive to levofloxacin, Septra, tetracycline. The day before  discharge, the patient reported she had diarrhea. At this time, her  Cleocin was discontinued. Clostridium difficile was ordered, and it was  not back at the time of discharge. Her antibiotics were changed to  doxycycline. She continued to improve. Diarrhea has improved. She will  be discharged home on doxycycline for 12 more days and be seen in the  office in 2 days; at that time, we will hopefully have the clostridium  difficile results back. She is stable at the time of discharge.      Kirk Ruths, M.D.  Electronically Signed     WMM/MEDQ  D:  09/17/2006  T:  09/17/2006  Job:  [id number]

## 2010-12-30 NOTE — Consult Note (Signed)
NAMEGenevieve Norlander             ACCOUNT NO.:  [id number]   MEDICAL RECORD NO.:  [id number]          PATIENT TYPE:  AMB   LOCATION:  DAY                           FACILITY:  [facility]   PHYSICIAN:  Kassie Mends, M.D.      DATE OF BIRTH:  1950/10/23   DATE OF CONSULTATION:  09/20/2006  DATE OF DISCHARGE:                                 CONSULTATION   PROBLEM LIST:  1. Thickened gastric wall seen on CT scan January, 2008, in the antrum      and pyloric area.  2. History of breast cancer, treated with mastectomy and 33 treatments      of radiation in 2004.  3. Hyperlipidemia.  4. Gastroesophageal reflux disease.  5. Uterine fibroid.  6. Depression.  7. Chronic constipation.   OBJECTIVE:  [name] is a 60 year old female who is seen in follow up  from the emergency department.  She presented there on January 25th with  abdominal pain.  She was also having diarrhea at the time.  She was  using ibuprofen and Aleve for headaches and various pains.  She was  instructed by the emergency department to stop using the ibuprofen and  Aleve, because of a diagnosis of gastritis which was made on CT scan.  She has a significant history of breast cancer and radiation therapy.  She denies any use of aspirin, BCs or Goody Powders.  She stopped taking  the ibuprofen and the Aleve and her symptoms have improved.  She was  also given hydrocodone and acetaminophen which caused her to be  constipated.  She was recently discharged from the hospital and is being  treated for MRSA infection of the knee.  She states that Zantac is  currently making her gastroesophageal reflux disease symptoms better  controlled than the Prilosec.  She denies any difficulty swallowing.   MEDICATIONS:  Vytorin, Mylanta as needed, MiraLAX; frequent doses of  Mylanta give her diarrhea.   MEDICATIONS:  1. Vytorin.  2. MiraLAX.  3. Doxycycline 100 mg b.i.d.  4. Zantac.   OBJECTIVE:  Weight 138 pounds, height 5 feet 3  inches, temperature 97.9,  blood pressure 124/82, pulse 72.  LUNGS:  Clear to auscultation bilaterally.CARDIOVASCULAR:  Regular  rhythm, no murmur, normal S1 and S2.  ABDOMEN:  Bowel sounds present, nondistended, mild tenderness to  palpation in the abdomen without rebound or guarding.   ASSESSMENT:  [name] is a 60 year old female with history of  abdominal pain and a thickened antrum and pylorus seen in CT scan.  The  most likely etiology for her findings on CT scan is nonsteroidal anti-  inflammatory drug gastritis.  However, she does have a history of breast  cancer in 2004 with radiation therapy.  The findings on CT scan need to  be further evaluated endoscopically.   Thank you for allowing me to see [name] in consultation.  My  recommendations follow.   RECOMMENDATIONS:  Her constipation is being successfully managed with  MiraLAX.  Her gastroesophageal reflux disease is currently being  successfully managed with Zantac.  She requested something for sleep.  Thank you for allowing me to see [name] in consultation, my  recommendations follow.   RECOMMENDATIONS:  1. She may use Zantac for now for her gastroesophageal reflux disease.      I explained that most patient's with Zantac develop a tolerance      over a 30-day period of time.  She should switch to Prilosec once      her Zantac supply is depleted.  2. I will schedule an upper endoscopy as soon as possible to evaluate      her antrum and her pylorus.  3. I gave her a prescription for Ambien CR 6.25 mg p.o. q.h.s. with      refills.  4. She has a return patient visit to see me in 6 weeks.      Kassie Mends, M.D.  Electronically Signed     SM/MEDQ  D:  09/21/2006  T:  09/21/2006  Job:  [id number]   cc:   Kirk Ruths, M.D.  Fax: [phone]

## 2010-12-30 NOTE — Op Note (Signed)
NAMEGenevieve Mullins             ACCOUNT NO.:  [id number]   MEDICAL RECORD NO.:  [id number]          PATIENT TYPE:  AMB   LOCATION:  DAY                           FACILITY:  [facility]   PHYSICIAN:  Kassie Mends, M.D.      DATE OF BIRTH:  Dec 28, 1950   DATE OF PROCEDURE:  05/29/2006  DATE OF DISCHARGE:                                 OPERATIVE REPORT   PROCEDURE:  Colonoscopy.   INDICATION FOR EXAMINATION:  [name] is a 60 year old female who  presents for average-risk colon cancer screening.  She has a personal  history of breast cancer.   FINDINGS:  1. Normal colon.  No evidence of polyps, masses, diverticula, inflammatory      change or vascular ectasia seen.  2. Normal retroflexed view of the rectum.   RECOMMENDATIONS:  1. Screening colonoscopy in 10 years.  2. Follow up with primary physician.   MEDICATIONS:  1. Demerol 100 mg IV.  2. Versed 5 mg IV.   PROCEDURE TECHNIQUE:  Physical exam was performed, and informed consent was  obtained from the patient after explaining the benefits, risks and  alternatives to the procedure.  The patient was connected to the monitor and  placed in left lateral position.  Continuous oxygen was provided by nasal  cannula, and IV medicine administered through an indwelling cannula.  After  administration of sedation and rectal exam, the patient's rectum was  intubated.  The scope was passed under direct visualization to the cecum.  The scope was subsequently removed slowly by carefully examining color,  texture, anatomy and integrity of the mucosa on the way out.  The patient  was recovered in the endoscopy suite and discharged to home in satisfactory  condition.      Kassie Mends, M.D.  Electronically Signed     SM/MEDQ  D:  05/29/2006  T:  05/30/2006  Job:  [id number]

## 2010-12-30 NOTE — H&P (Signed)
NAME:  Deborah Mullins             ACCOUNT NO.:  [id number]   MEDICAL RECORD NO.:  [id number]         PATIENT TYPE:  AMB   LOCATION:                                FACILITY:  [facility]   PHYSICIAN:  Kassie Mends, M.D.      DATE OF BIRTH:  1950-09-11   DATE OF ADMISSION:  DATE OF DISCHARGE:  LH                                HISTORY & PHYSICAL   CHIEF COMPLAINT:  Chronic constipation, has never had colonoscopy.   HISTORY OF PRESENT ILLNESS:  Deborah Mullins is a 60 year old Caucasian female  who says over the last several months she developed chronic constipation.  She has gone up to 4 days without a bowel movement.  She is having severe  post prandial low abdominal cramp-like pain.  She has occasional hard  stools.  She has noticed external hemorrhoids.  She denies any rectal  bleeding or melena.  She states her abdominal pain is better post  defecation.  She has tried increasing the fiber in her diet.  She complains  of gas and bloating.  She also has begun to have some heartburn indigestion  maybe once or twice a week for the last couple of months as well.   PAST MEDICAL HISTORY:  1. Uterine fibroids.  2. Breast cancer in 2004, status post radiation treatment and left      mastectomy.  3. Hypercholesterolemia.  4. Depression.  5. Tubal ligation.  6. C-section.  7. Tonsillectomy.   CURRENT MEDICATIONS:  Vytorin 10/40 mg daily.   ALLERGIES:  PENICILLIN.   FAMILY HISTORY:  No known family history of colorectal carcinoma, liver, or  chronic GI problems.  Mother deceased at age 95 secondary to CHF.  She is  unsure of her father's history.  She has a sister with a history of cervical  cancer.  Otherwise five healthy siblings.   SOCIAL HISTORY:  Baxter Kail is a widow.  She has one grown healthy son.  She is  retired.  She has a remote history of tobacco use, occasional marijuana use,  denies any alcohol use.   REVIEW OF SYSTEMS:  CONSTITUTIONAL:  Weight is steadily increasing.  She has  gained about 15 pounds in the last 2 years since she moved from [name].  Denies any fever or chills.  CARDIOPULMONARY:  Denies any chest pain,  palpitations  PULMONARY:  Denies shortness of breath, dyspnea, cough,  hemoptysis.  GI:  See HPI.  GYN:  Last menstrual period April 28, 2006.  She has been having heavy irregular menses.  She has a history of uterine  fibroids.  She was seeing Dr. Lisette Grinder until his practice was closed.   PHYSICAL EXAMINATION:  VITAL SIGNS:  Weight 140 pounds, height 63 inches,  temp 98.6, blood pressure 120/70, pulse 74.  GENERAL:  Deborah Mullins is 60 year old Caucasian female who is alert,  oriented, pleasant, cooperative in no acute distress.  HEENT:  Sclera clear, nonicteric.  Conjunctivae pink.  Oropharynx is pink  and moist without any lesions.  NECK:  Supple.  No masses or thyromegaly.  CHEST:  Heart regular rate and rhythm.  Normal S1 S2 without murmurs,  clicks, rubs or gallops.  LUNGS:  Clear to auscultation bilaterally.  ABDOMEN:  Positive bowel sounds x4.  No bruits auscultated.  Soft,  nontender, nondistended.  __________  .  No tenderness or guarding.  EXTREMITIES:  Without clubbing or edema bilaterally.  SKIN:  Pink, warm, and dry.  She does have various maculopapular lesions to  her upper extremities with some surrounding erythema without any exudate.   IMPRESSION:  1. Deborah Mullins is a 60 year old Caucasian female with chronic constipation      over the last several months.  She has gone up to 4 days without a      bowel movement.  She is having some low abdominal pain as well as some      hemorrhoids with this.  She has never had a colonoscopy.  She is going      to proceed with colonoscopy at this time.  2. She has a personal history of breast carcinoma which is in remission.  3. She has intermittent gastroesophageal reflux disease symptoms and we      will give a trial of omeprazole.   PLAN:  1. Refer to gynecologist regarding her  irregular menses.  2. MiraLax 17 grams daily as needed for constipation.  3. Omeprazole 20 mg daily for GERD, #90, with one refill.  4. We will schedule colonoscopy with Dr. Cira Servant in the near future.  I have      discussed the procedure, including risks and benefits, to include but      not limited to bleeding, infection, perforation, drug reaction.  She      agrees with the plan.  Consent will be obtained.      Nicholas Lose, N.P.      Kassie Mends, M.D.  Electronically Signed    KC/MEDQ  D:  05/14/2006  T:  05/15/2006  Job:  [id number]

## 2010-12-30 NOTE — H&P (Signed)
NAMEGenevieve Mullins             ACCOUNT NO.:  [id number]   MEDICAL RECORD NO.:  [id number]          PATIENT TYPE:  INP   LOCATION:  A304                          FACILITY:  [facility]   PHYSICIAN:  Kirk Ruths, M.D.DATE OF BIRTH:  11-27-1950   DATE OF ADMISSION:  DATE OF DISCHARGE:                              HISTORY & PHYSICAL   CHIEF COMPLAINT:  This female presented to her primary care physician on  September 11, 2006 with the complaint of a painful right knee that was red  and swollen.  The onset was approximately four to five days prior.  She  was treated for cellulitis with Bactrim and given a 10 day course one  pill p.o. b.i.d.  She had Vicodin to treat the pain and she was sent  home.  Two days later on January 31, the patient called the office and  reported that the swelling and pain had not gotten any better and she  was advised to come into the office to be seen.   PAST MEDICAL HISTORY:  The patient had a history of breast cancer and  had a lumpectomy in 2004 and radiation.  She had one C-section, an  abortion and a tonsillectomy.  She is treated for hyperlipidemia and  GERD.  She had total colonoscopy in 2007 which was negative.   CURRENT MEDICATIONS:  1. Vytorin 10/40 mg 1 p.o. q hour of sleep.  2. Omeprazole 20 mg 1 p.o. daily.   ALLERGIES:  PENICILLIN.   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  She  denies alcohol or tobacco, occasional marijuana.   REVIEW OF SYSTEMS:  Denies fever.  No joint pains or myalgias.  No  dyspnea.  No chest pain.  No rashes other than the redness on her knee.   PHYSICAL EXAMINATION:  VITAL SIGNS:  Blood pressure 120/80, respirations  16, temperature 98.7 on September 11, 2006.  GENERAL APPEARANCE:  The patient is in no acute distress.  She is alert  and oriented.  HEENT:  Normocephalic.  NECK:  Supple.  CHEST:  Clear to auscultation bilaterally.  HEART:  Regular rate and rhythm.  No murmurs, rubs or  gallops.  ABDOMEN:  Soft and nontender.  EXTREMITIES:  Full range of motion bilaterally.  Tender right knee with  erythema and one open lesion approximately 2 mm wide with bloody  serosanguineous exudate.  No knee effusion.  No popliteal tenderness.  Normal proximal and distal joint.  Trace pitting edema in the right  extremity to the tibial tuberosity.   ASSESSMENT:  Right knee pain with cellulitis and questionable  Methicillin resistant staphylococcus aureus, not responding to Bactrim.   PLAN:  Admit for IV antibiotics and orthopedic consult.     ______________________________  Jimmy Footman, PA-C      Kirk Ruths, M.D.  Electronically Signed    SE/MEDQ  D:  09/13/2006  T:  09/14/2006  Job:  [id number]

## 2010-12-30 NOTE — Assessment & Plan Note (Signed)
[facility]                       [place] CARDIOLOGY OFFICE NOTE   Scherrie Gerlach                    MRN:          [id number]  DATE:09/21/2006                            DOB:          November 18, 1950    [name] is seen today as a new patient. She has been seen by Dr.  Dorethea Clan. She has had atypical chest pain.   She has had a few medical problems recently. She was just hospitalized  for a question of MRSA. She had an infection in her right knee and was  initially treated with Bactrim as an outpatient and did not improve. She  was hospitalized by Dr. Sherwood Gambler. She was sent home on doxicycline.   Coronary risk factors include hyperlipidemia and family history.   She does not smoke cigarettes but smokes marijuana.   She continues to have some atypical chest pain, it is not exertional, it  can be on either side of her chest, it can occur at rest. These are  unchanged from previous and do not further workup. She also has a  history of a question of gastritis, she is on Zantac and her stomach has  been improved. She is on Vytorin for her hyperlipidemia.   REVIEW OF SYSTEMS:  Otherwise remarkable for a sore right knee due to  her infection.   MEDICATIONS:  1. Zantac 75 a day.  2. Doxicycline 100 b.i.d.  3. Vytorin 10/40.   PHYSICAL EXAMINATION:  VITAL SIGNS:  Blood pressure is 120/70, pulse 70  and regular.  HEENT:  Normal. Carotids normal without bruits.  LUNGS:  Clear.  HEART:  S1, S2, normal heart sounds.  ABDOMEN:  Benign.  EXTREMITIES:  Lower extremities intact pulses, no edema. The right knee  is improved. There is still mild erythema over the knee cap but no  fluctuance and no heat.   IMPRESSION:  Stable atypical chest pain in the past. No need for further  workup at this time. Continue Vytorin for hyperlipidemia. She can have  her lipid and liver profile followed up by Dr. Sherwood Gambler in [facility]  in 6 months.   She will  continue her doxicycline for her MRSA or knee infection. She  will be seen back in our clinic in a year unless she has a new clinical  symptom.     Noralyn Pick. Eden Emms, MD, [facility]  Electronically Signed    PCN/MedQ  DD: 09/21/2006  DT: 09/21/2006  Job #: [id number]

## 2010-12-30 NOTE — Op Note (Signed)
NAMEGenevieve Mullins             ACCOUNT NO.:  [id number]   MEDICAL RECORD NO.:  [id number]          PATIENT TYPE:  AMB   LOCATION:  DAY                           FACILITY:  [facility]   PHYSICIAN:  Kassie Mends, M.D.      DATE OF BIRTH:  1951-07-03   DATE OF PROCEDURE:  09/28/2006  DATE OF DISCHARGE:                               OPERATIVE REPORT   PROCEDURE:  Esophagogastroduodenoscopy with cold forceps biopsy.   INDICATION FOR EXAM:  Deborah Mullins is a 60 year old female who had a  thickened gastric wall seen on CT scan in January2008 in the antrum  and the pyloric area.  She has a history of breast cancer treated with a  mastectomy and radiation in 2004.   FINDINGS:  Antral erosions, biopsied via cold forceps to evaluate for H.  pylori infection.  Otherwise normal esophagus without evidence of  Barrett's.  Normal duodenal bulb and second portion of the duodenum.   RECOMMENDATIONS:  1. I will call Deborah Mullins with the results of her biopsy.  2. She may resume her previous diet.  Avoid gastric irritants.  She is      given a handout on gastritis as well as gastric irritants.  3. She should avoid aspirin and anti-inflammatory drugs for 30 days.      If she is going to use ibuprofen or other anti-inflammatory drugs,      she should use a proton pump inhibitor.  4. She already has a follow-up appointment to see me in 6 weeks.   MEDICATIONS:  1. Demerol 100 mg IV.  2. Versed 5 mg IV.   PROCEDURE TECHNIQUE:  Physical exam was performed and informed consent  was obtained from the patient after explaining the benefits, risks and  alternatives to the procedure.  The patient was connected to the monitor  and placed in the left lateral position.  Continuous oxygen was provided  by nasal cannula and IV medicine administered through an indwelling  cannula.  After administration of sedation, the patient's esophagus was  intubated with a diagnostic gastroscope and the scope was advanced under  direct visualization to the second portion of the  duodenum.  The scope was subsequently removed slowly by carefully  examining the color, texture, anatomy and integrity of the mucosa on the  way out.  The patient was recovered in the endoscopy suite and  discharged home in satisfactory condition.      Kassie Mends, M.D.  Electronically Signed     SM/MEDQ  D:  09/29/2006  T:  09/29/2006  Job:  [id number]   cc:   Patrica Duel, M.D.  Fax: [phone]

## 2010-12-30 NOTE — Consult Note (Signed)
NAMEGenevieve Mullins             ACCOUNT NO.:  [id number]   MEDICAL RECORD NO.:  [id number]          PATIENT TYPE:  INP   LOCATION:  A304                          FACILITY:  [facility]   PHYSICIAN:  J. Darreld Mclean, M.D. DATE OF BIRTH:  Jun 03, 1951   DATE OF CONSULTATION:  DATE OF DISCHARGE:                                 CONSULTATION   REQUESTING PHYSICIAN:  Kirk Ruths, M.D.   The patient is a 60 year old female who has had a week of tenderness on  her right knee.  It has gotten progressively worse.  She put a needle in  the knee area and tried to drain some purulent material.  She got some,  but it got worse, redness got worse.  She was admitted here to the  hospital last night with worsening of cellulitis to the right knee,  prepatellar area.  She had been on Bactrim p.o. for a couple of days.  A  culture was obtained from the knee previously.  She said the redness is  slightly less but it is very painful, very tender.  It is in the  prepatellar area.  She has got a small effusion of the prepatellar area.  The knee itself has no effusion.  X-rays are negative except for the  prepatellar swelling.  Range of motion is limited secondary to pain.  The patient is extremely sensitive to the pain.  The knee is stable.  She has no other puncture wounds.  No other areas of inflammation.  No  joint involvement.  The patient is afebrile.   Appropriate prep and drape, aspirated the area of approximately 1-to-2-  cc of a purulent material.  It was darkish in color.  I sent it for  culture and sensitivity.   IMPRESSION:  Prepatellar bursitis/cellulitis of the right knee.   Continue IV antibiotics.  She is on Cleocin right now.  I have ordered a  warm K-Pad.  Will follow.  Depending on the results of the culture,  antibiotics may need to be changed.  We will follow.  Thank you.           ______________________________  [name]. Darreld Mclean, M.D.     JWK/MEDQ  D:  09/14/2006  T:   09/14/2006  Job:  [id number]

## 2011-03-15 ENCOUNTER — Encounter: Payer: Self-pay | Admitting: Gastroenterology

## 2011-05-05 LAB — I-STAT 8, (EC8 V) (CONVERTED LAB)
Acid-Base Excess: 4 — ABNORMAL HIGH
Bicarbonate: 25.8 — ABNORMAL HIGH
Hemoglobin: 15.6 — ABNORMAL HIGH
Potassium: 4.1
Sodium: 139
TCO2: 27
pH, Ven: 7.523 — ABNORMAL HIGH

## 2011-05-05 LAB — URINALYSIS, ROUTINE W REFLEX MICROSCOPIC
Glucose, UA: NEGATIVE
Ketones, ur: >80 — AB
Protein, ur: NEGATIVE

## 2011-05-05 LAB — URINE MICROSCOPIC-ADD ON

## 2011-05-05 LAB — POCT I-STAT CREATININE
Creatinine, Ser: 0.8
Operator id: 205141

## 2011-05-25 LAB — URINALYSIS, ROUTINE W REFLEX MICROSCOPIC
Bilirubin Urine: NEGATIVE
Glucose, UA: NEGATIVE
Hgb urine dipstick: NEGATIVE
Ketones, ur: NEGATIVE
Protein, ur: NEGATIVE
Urobilinogen, UA: 0.2

## 2011-05-25 LAB — DIFFERENTIAL
Basophils Relative: 0
Eosinophils Absolute: 0.1
Eosinophils Relative: 2
Lymphs Abs: 2.3
Monocytes Absolute: 0.7
Monocytes Relative: 9
Neutrophils Relative %: 60

## 2011-05-25 LAB — CBC
HCT: 37.3
MCHC: 33.5
MCV: 92.1
RBC: 4.05
WBC: 7.7

## 2011-05-25 LAB — BASIC METABOLIC PANEL
CO2: 28
Chloride: 105
Creatinine, Ser: 0.69
GFR calc Af Amer: >60
Potassium: 4.3

## 2011-06-07 ENCOUNTER — Other Ambulatory Visit: Payer: Self-pay | Admitting: Gastroenterology

## 2011-06-07 DIAGNOSIS — Z139 Encounter for screening, unspecified: Secondary | ICD-10-CM

## 2011-06-13 ENCOUNTER — Ambulatory Visit (HOSPITAL_COMMUNITY)
Admission: RE | Admit: 2011-06-13 | Discharge: 2011-06-13 | Disposition: A | Discharge status: Home / Self Care | Payer: No Typology Code available for payment source | Source: Ambulatory Visit | Attending: Gastroenterology | Admitting: Gastroenterology

## 2011-06-13 DIAGNOSIS — Z139 Encounter for screening, unspecified: Secondary | ICD-10-CM

## 2011-06-13 DIAGNOSIS — Z1231 Encounter for screening mammogram for malignant neoplasm of breast: Secondary | ICD-10-CM | POA: Insufficient documentation

## 2011-06-22 ENCOUNTER — Other Ambulatory Visit: Payer: Self-pay | Admitting: Gastroenterology

## 2011-06-22 DIAGNOSIS — R928 Other abnormal and inconclusive findings on diagnostic imaging of breast: Secondary | ICD-10-CM

## 2011-06-28 ENCOUNTER — Other Ambulatory Visit (HOSPITAL_COMMUNITY): Payer: Self-pay | Admitting: Family Medicine

## 2011-06-28 ENCOUNTER — Ambulatory Visit (HOSPITAL_COMMUNITY)
Admission: RE | Admit: 2011-06-28 | Discharge: 2011-06-28 | Disposition: A | Discharge status: Home / Self Care | Payer: No Typology Code available for payment source | Source: Ambulatory Visit | Attending: Gastroenterology | Admitting: Gastroenterology

## 2011-06-28 DIAGNOSIS — R928 Other abnormal and inconclusive findings on diagnostic imaging of breast: Secondary | ICD-10-CM

## 2011-06-28 DIAGNOSIS — Z139 Encounter for screening, unspecified: Secondary | ICD-10-CM

## 2011-07-04 ENCOUNTER — Other Ambulatory Visit: Payer: Self-pay | Admitting: Obstetrics and Gynecology

## 2011-07-04 DIAGNOSIS — Z139 Encounter for screening, unspecified: Secondary | ICD-10-CM

## 2012-03-21 ENCOUNTER — Institutional Professional Consult (permissible substitution): Payer: No Typology Code available for payment source | Admitting: Emergency Medicine

## 2012-03-26 ENCOUNTER — Encounter (HOSPITAL_COMMUNITY): Payer: Self-pay

## 2012-03-26 ENCOUNTER — Emergency Department (HOSPITAL_COMMUNITY)
Admission: EM | Admit: 2012-03-26 | Discharge: 2012-03-26 | Disposition: A | Discharge status: Home / Self Care | Payer: 59 | Attending: Emergency Medicine | Admitting: Emergency Medicine

## 2012-03-26 ENCOUNTER — Emergency Department (HOSPITAL_COMMUNITY): Payer: 59

## 2012-03-26 DIAGNOSIS — I10 Essential (primary) hypertension: Secondary | ICD-10-CM | POA: Insufficient documentation

## 2012-03-26 DIAGNOSIS — Z853 Personal history of malignant neoplasm of breast: Secondary | ICD-10-CM | POA: Insufficient documentation

## 2012-03-26 DIAGNOSIS — IMO0002 Reserved for concepts with insufficient information to code with codable children: Secondary | ICD-10-CM | POA: Insufficient documentation

## 2012-03-26 DIAGNOSIS — S9030XA Contusion of unspecified foot, initial encounter: Secondary | ICD-10-CM | POA: Insufficient documentation

## 2012-03-26 DIAGNOSIS — Y9289 Other specified places as the place of occurrence of the external cause: Secondary | ICD-10-CM | POA: Insufficient documentation

## 2012-03-26 HISTORY — DX: Essential (primary) hypertension: I10

## 2012-03-26 MED ORDER — IBUPROFEN 600 MG PO TABS: 600.0000 mg | ORAL_TABLET | Freq: Four times a day (QID) | ORAL | Status: AC | PRN

## 2012-03-26 MED ORDER — HYDROCODONE-ACETAMINOPHEN 5-325 MG PO TABS
1.0000 | ORAL_TABLET | Freq: Once | ORAL | Status: AC
  Administered 2012-03-26: 1 via ORAL
  Filled 2012-03-26: qty 1

## 2012-03-26 NOTE — ED Notes (Signed)
Pt ready for discharge, asked RN to take her to her car. Pt asked if she was driving, she replied yes but said to "tell them someone else is driving me." EDPa informed of pt's intent.

## 2012-03-26 NOTE — ED Notes (Signed)
Pt states a door hit her L lat foot this am. No pain at the time. Pt states her foot hurts on the ant surface at ankle and on the medial side just distal to the ankle.

## 2012-03-26 NOTE — ED Notes (Signed)
Pedal pulse palpable, left ankle swollen, can wiggle toes.

## 2012-03-26 NOTE — ED Notes (Signed)
Pt reports accidentally hit left foot on bathroom door.  C/O pain to left foot and ankle radiating up her leg.  Ankle swollen.

## 2012-03-30 NOTE — ED Provider Notes (Signed)
History     CSN: [id number]  Arrival date & time 03/26/12  1645   First MD Initiated Contact with Patient 03/26/12 1727      Chief Complaint  Patient presents with  . Foot Pain    (Consider location/radiation/quality/duration/timing/severity/associated sxs/prior treatment) HPI Comments: Deborah Mullins presents with left foot pain which radiates into her ankle and lower leg after hitting her foot against the latched bathroom door this morning.  She has used hydrocodone and elevation of her injured foot without relief of her symptoms.  Her pain is constant,  But worse with weight bearing, radiates into left lower leg.  The history is provided by the patient.    Past Medical History  Diagnosis Date  . Hypertension   . Cancer     breast     Past Surgical History  Procedure Date  . Breast surgery   . Tubal ligation   . Tonsillectomy   . Cesarean section     No family history on file.  History  Substance Use Topics  . Smoking status: Never Smoker   . Smokeless tobacco: Not on file  . Alcohol Use: No    OB History    Grav Para Term Preterm Abortions TAB SAB Ect Mult Living                  Review of Systems  Musculoskeletal: Positive for joint swelling, arthralgias and gait problem.  Skin: Negative for wound.  Neurological: Negative for weakness and numbness.    Allergies  Penicillins  Home Medications   Current Outpatient Rx  Name Route Sig Dispense Refill  . IBUPROFEN 600 MG PO TABS Oral Take 1 tablet (600 mg total) by mouth every 6 (six) hours as needed for pain. 20 tablet 0    BP 149/106  Pulse 96  Temp 97.4 F (36.3 C) (Oral)  Resp 20  Ht 5\' 4"  (1.626 m)  Wt 144 lb (65.318 kg)  BMI 24.72 kg/m2  SpO2 99%  Physical Exam  Constitutional: She appears well-developed and well-nourished.  HENT:  Head: Atraumatic.  Neck: Normal range of motion.  Cardiovascular:       Pulses equal bilaterally  Musculoskeletal:       Left ankle: She exhibits  swelling. She exhibits no ecchymosis, no deformity and normal pulse. tenderness. Medial malleolus and AITFL tenderness found. No proximal fibula tenderness found. Achilles tendon normal.  Neurological: She is alert. She has normal strength. She displays normal reflexes. No sensory deficit.       Equal strength  Skin: Skin is warm and dry.  Psychiatric: She has a normal mood and affect.    ED Course  Procedures (including critical care time)  Labs Reviewed - No data to display No results found.   1. Contusion of left ankle or foot    Discussed ace wrap vs aso - pt states has aso at home,  Will apply her own.  She also has hydrocodone at home from previous back pain injury.   MDM  Encouraged RICE.  xrays reviewed and discussed with pt.  Referral to pcp for a recheck in 1 week if sx persist.  Ibuprofen prescribed.  Encouraged she can continue taking her home hydrocodone prn.          Burgess Amor, PA 03/30/12 1726

## 2012-03-30 NOTE — ED Provider Notes (Signed)
Medical screening examination/treatment/procedure(s) were performed by non-physician practitioner and as supervising physician I was immediately available for consultation/collaboration.   Laray Anger, DO 03/30/12 1851

## 2012-04-19 ENCOUNTER — Encounter: Payer: Self-pay | Admitting: Emergency Medicine

## 2012-04-22 ENCOUNTER — Ambulatory Visit (INDEPENDENT_AMBULATORY_CARE_PROVIDER_SITE_OTHER): Payer: Managed Care, Other (non HMO) | Admitting: Emergency Medicine

## 2012-04-22 ENCOUNTER — Encounter: Payer: Self-pay | Admitting: Emergency Medicine

## 2012-04-22 VITALS — BP 138/70 | HR 101 | Temp 97.8°F | Ht 63.0 in | Wt 149.2 lb

## 2012-04-22 DIAGNOSIS — J984 Other disorders of lung: Secondary | ICD-10-CM

## 2012-04-22 NOTE — Progress Notes (Signed)
Subjective:    Patient ID: Deborah Mullins, female    DOB: [birth year], 61 y.o.   MRN: [id number]  HPI 61 yo woman, former smoker (10 pk-yrs) , hx of breast CA s/p surgery 2005 + XRT, HTN, allergic rhinitis. She is referred by Dr Wilford Grist to discuss her hx of pulmonary nodule. This was first discovered by CT scan in . Last CT/PET was done 01/2010 >> shows 0.8 x 1.5cm RLL perivascular nodule with ground glass and more solid components.  She has been seen by both Dr Sherene Sires and Dr Edwyna Shell regarding the lesion, with concern that it needed to be resected due to suspicion for BAC. She had 2nd opinion in [name], PET done 01/2010 was not hypermetabolic and so surgery was cancelled.  She now needs to be reevaluated for whether she needs further imaging.    Review of Systems  Constitutional: Positive for unexpected weight change. Negative for fever, chills, diaphoresis, activity change, appetite change and fatigue.  HENT: Positive for ear pain and sneezing. Negative for hearing loss, nosebleeds, congestion, sore throat, facial swelling, rhinorrhea, mouth sores, trouble swallowing, neck pain, neck stiffness, dental problem, voice change, postnasal drip, sinus pressure, tinnitus and ear discharge.   Eyes: Negative for photophobia, discharge, itching and visual disturbance.  Respiratory: Positive for shortness of breath. Negative for apnea, cough, choking, chest tightness, wheezing and stridor.   Cardiovascular: Positive for chest pain. Negative for palpitations and leg swelling.  Gastrointestinal: Negative for nausea, vomiting, abdominal pain, constipation, blood in stool and abdominal distention.  Genitourinary: Negative for dysuria, urgency, frequency, hematuria, flank pain, decreased urine volume and difficulty urinating.  Musculoskeletal: Positive for arthralgias. Negative for myalgias, back pain, joint swelling and gait problem.  Skin: Negative for color change, pallor and rash.  Neurological: Positive for  headaches. Negative for dizziness, tremors, seizures, syncope, speech difficulty, weakness, light-headedness and numbness.  Hematological: Negative for adenopathy. Does not bruise/bleed easily.  Psychiatric/Behavioral: Negative for confusion, disturbed wake/sleep cycle and agitation. The patient is nervous/anxious.    Past Medical History  Diagnosis Date  . Hypertension   . Cancer     breast   . Hyperlipidemia   . GERD (gastroesophageal reflux disease)   . Anxiety   . Allergic rhinitis   . Lung nodule 2009  . Asthma      Family History  Problem Relation Age of Onset  . Heart disease Mother     CHF  . Cancer Sister      History   Social History  . Marital Status: Widowed    Spouse Name: N/A    Number of Children: 1  . Years of Education: N/A   Occupational History  . retired     Science writer   Social History Main Topics  . Smoking status: Former Smoker -- 1.0 packs/day for 10 years    Types: Cigarettes    Quit date: 08/14/1974  . Smokeless tobacco: Never Used  . Alcohol Use: No  . Drug Use: No  . Sexually Active: Not on file   Other Topics Concern  . Not on file   Social History Narrative  . No narrative on file     Allergies  Allergen Reactions  . Penicillins     REACTION: Unknown reaction  . Potassium-Containing Compounds      Outpatient Prescriptions Prior to Visit  Medication Sig Dispense Refill  . dexlansoprazole (DEXILANT) 60 MG capsule Take 60 mg by mouth daily.      . hydrochlorothiazide (HYDRODIURIL) 25  MG tablet Take 25 mg by mouth daily.      . simvastatin (ZOCOR) 80 MG tablet Take 80 mg by mouth at bedtime.      . fexofenadine (ALLEGRA) 180 MG tablet Take 180 mg by mouth daily.      Marland Kitchen PARoxetine (PAXIL) 30 MG tablet Take 30 mg by mouth every morning. 2 tablets each morning      . ALPRAZolam (XANAX) 0.5 MG tablet Take 0.5 mg by mouth daily as needed.      . clindamycin (CLEOCIN) 300 MG capsule Take 300 mg by mouth 4 (four) times  daily.      Marland Kitchen HYDROcodone-acetaminophen (VICODIN) 5-500 MG per tablet Take 1 tablet by mouth every 4 (four) hours as needed.           Objective:   Physical Exam Filed Vitals:   04/22/12 1422  BP: 138/70  Pulse: 101  Temp: 97.8 F (36.6 C)   Gen: Pleasant, well-nourished, in no distress,  normal affect  ENT: No lesions,  mouth clear,  oropharynx clear, no postnasal drip  Neck: No JVD, no TMG, no carotid bruits  Lungs: No use of accessory muscles, no dullness to percussion, clear without rales or rhonchi  Cardiovascular: RRR, heart sounds normal, no murmur or gallops, no peripheral edema  Musculoskeletal: No deformities, no cyanosis or clubbing  Neuro: alert, non focal  Skin: Warm, no lesions or rashes      Assessment & Plan:  PULMONARY NODULE RLL perivascular nodule with GG component. I reviewed her CT scans and PET scans from 2009 to 2011, including the PET from [name]. Even though it had been stable in size and was PET negative in 2011, I would still worry about a possible BAC. She needs a repeat scan now - if stable in appearance then we can sort out whether she needs any surveillance. If it has changed in size or density, then I think we should refer to thoracic surgery. She was referred in similar fashion before, was then seen in CT. They decided to defer sgy at that time.

## 2012-04-22 NOTE — Patient Instructions (Addendum)
We will set up a CT scan of the chest to compare with your prior studies.  Follow with Dr Delton Coombes next available to review your scan

## 2012-04-22 NOTE — Assessment & Plan Note (Addendum)
RLL perivascular nodule with GG component. I reviewed her CT scans and PET scans from 2009 to 2011, including the PET from [name]. Even though it had been stable in size and was PET negative in 2011, I would still worry about a possible BAC. She needs a repeat scan now - if stable in appearance then we can sort out whether she needs any surveillance. If it has changed in size or density, then I think we should refer to thoracic surgery. She was referred in similar fashion before, was then seen in CT. They decided to defer sgy at that time.

## 2012-04-25 ENCOUNTER — Ambulatory Visit
Admission: RE | Admit: 2012-04-25 | Discharge: 2012-04-25 | Disposition: A | Discharge status: Home / Self Care | Payer: Managed Care, Other (non HMO) | Source: Ambulatory Visit | Attending: Emergency Medicine | Admitting: Emergency Medicine

## 2012-04-25 ENCOUNTER — Ambulatory Visit (HOSPITAL_COMMUNITY): Payer: 59

## 2012-04-25 DIAGNOSIS — J984 Other disorders of lung: Secondary | ICD-10-CM

## 2012-04-25 MED ORDER — IOHEXOL 300 MG/ML  SOLN
75.0000 mL | Freq: Once | INTRAMUSCULAR | Status: AC | PRN
  Administered 2012-04-25: 75 mL via INTRAVENOUS

## 2012-04-26 ENCOUNTER — Other Ambulatory Visit (HOSPITAL_COMMUNITY): Payer: Self-pay

## 2012-05-02 ENCOUNTER — Telehealth: Payer: Self-pay | Admitting: Emergency Medicine

## 2012-05-02 DIAGNOSIS — R911 Solitary pulmonary nodule: Secondary | ICD-10-CM

## 2012-05-02 NOTE — Telephone Encounter (Signed)
I spoke with pt and she is requesting her CT results. She is aware RB is out of the office today. She would like a return call tomorrow when he returns. Please advise RB thanks

## 2012-05-03 NOTE — Telephone Encounter (Signed)
Discussed results with the patient. The nodule is growing slowly, looks more solid to me. I believe she needs to be seen by thoracic surgery for probable resection. She has many concerns about her current social situation, her ability to have a procedure. She is willing to be referred to [facility], will arrange for this today

## 2012-05-06 ENCOUNTER — Telehealth: Payer: Self-pay | Admitting: Emergency Medicine

## 2012-05-06 DIAGNOSIS — R911 Solitary pulmonary nodule: Secondary | ICD-10-CM

## 2012-05-06 NOTE — Telephone Encounter (Signed)
Spoke with RB and he states okay to go ahead and order PET Order was sent to [facility]

## 2012-05-07 ENCOUNTER — Telehealth: Payer: Self-pay | Admitting: Emergency Medicine

## 2012-05-07 NOTE — Telephone Encounter (Signed)
Pt returned my call and is aware of her appointment with Dr. Dorris Fetch on Tues 05/14/12 at 1:15. Deborah Mullins

## 2012-05-07 NOTE — Telephone Encounter (Signed)
Patient returning call about referral to [facility], unsure if there is any more information patient may need.  Will forward to [facility] to follow up on, thank you.

## 2012-05-07 NOTE — Telephone Encounter (Signed)
Returned patient's phone call at the below number. No answer. Left message on answering machine for pt to call me back at my direct phone number, [phone]. Rhonda J Cobb

## 2012-05-14 ENCOUNTER — Encounter: Payer: Self-pay | Admitting: Thoracic Surgery (Cardiothoracic Vascular Surgery)

## 2012-05-14 ENCOUNTER — Other Ambulatory Visit: Payer: Self-pay

## 2012-05-14 ENCOUNTER — Other Ambulatory Visit: Payer: Self-pay | Admitting: Thoracic Surgery (Cardiothoracic Vascular Surgery)

## 2012-05-14 ENCOUNTER — Institutional Professional Consult (permissible substitution) (INDEPENDENT_AMBULATORY_CARE_PROVIDER_SITE_OTHER): Payer: Managed Care, Other (non HMO) | Admitting: Thoracic Surgery (Cardiothoracic Vascular Surgery)

## 2012-05-14 VITALS — BP 132/88 | HR 90 | Resp 18 | Ht 63.0 in | Wt 140.0 lb

## 2012-05-14 DIAGNOSIS — R911 Solitary pulmonary nodule: Secondary | ICD-10-CM

## 2012-05-14 DIAGNOSIS — D381 Neoplasm of uncertain behavior of trachea, bronchus and lung: Secondary | ICD-10-CM

## 2012-05-14 NOTE — Progress Notes (Signed)
PCP is MACKENZIE,BRIAN, MD Referring Provider is Byrum, Robert S, MD  Chief Complaint  Patient presents with  . Lung Lesion    Referral from Dr Byrum for surgical eval on Rt lower lobe nodule, Chest CT 04/25/12, PET Scan pending    HPI: 61-year-old female presents for evaluation of her right lung nodule.  Deborah Mullins is a 61-year-old woman with a remote history of light tobacco abuse, who was found and 2009 to have a right lower lobe lung nodule. This was a groundglass opacity. She had a PET scan at one point which showed no hypermetabolic uptake. She had been evaluated for surgery in 2011, but ultimately did not have it done. She recently had a repeat CT which showed an increase in size and density of the nodule. She was referred to Dr. Byrum. He recommended she undergo excisional biopsy.  Says her breathing is been stable. She does short of breath if she engages in very heavy exertion. She's not had any chest pain pressure or tightness. She has an occasional cough, usually nonproductive. She's not had any hemoptysis. She's not had any weight loss.   Past Medical History  Diagnosis Date  . Hypertension   . Cancer     breast   . Hyperlipidemia   . GERD (gastroesophageal reflux disease)   . Anxiety   . Allergic rhinitis   . Lung nodule 2009  . Asthma     Past Surgical History  Procedure Date  . Breast surgery 2005    lumpectomy  . Tubal ligation   . Tonsillectomy   . Cesarean section     Family History  Problem Relation Age of Onset  . Heart disease Mother     CHF  . Cancer Sister     Social History History  Substance Use Topics  . Smoking status: Former Smoker -- 1.0 packs/day for 10 years    Types: Cigarettes    Quit date: 08/14/1974  . Smokeless tobacco: Never Used  . Alcohol Use: No    Current Outpatient Prescriptions  Medication Sig Dispense Refill  . amitriptyline (ELAVIL) 25 MG tablet Take 25 mg by mouth at bedtime.      . dexlansoprazole (DEXILANT) 60 MG  capsule Take 60 mg by mouth daily.      . hydrochlorothiazide (HYDRODIURIL) 25 MG tablet Take 25 mg by mouth daily.      . HYDROcodone-acetaminophen (NORCO/VICODIN) 5-325 MG per tablet Take 1 tablet by mouth daily.      . simvastatin (ZOCOR) 80 MG tablet Take 80 mg by mouth at bedtime.        Allergies  Allergen Reactions  . Potassium-Containing Compounds   . Penicillins Rash    Review of Systems  Constitutional: Positive for unexpected weight change (weight gain). Negative for activity change, appetite change and fatigue.  Eyes: Positive for visual disturbance (blurred vision).  Respiratory: Positive for shortness of breath (with heavy exertion). Negative for chest tightness.   Cardiovascular: Negative for chest pain and leg swelling.  Gastrointestinal:       Constipation, blood in stools, reflux   Genitourinary: Positive for frequency.  Musculoskeletal: Positive for joint swelling and arthralgias.       Myalgias  Neurological: Positive for numbness and headaches.       Memory problems  Psychiatric/Behavioral: Positive for dysphoric mood.  All other systems reviewed and are negative.    BP 132/88  Pulse 90  Resp 18  Ht 5' 3" (1.6 m)  Wt 140 lb (  63.504 kg)  BMI 24.80 kg/m2  SpO2 96% Physical Exam  Vitals reviewed. Constitutional: She is oriented to person, place, and time. She appears well-developed and well-nourished.  HENT:  Head: Normocephalic and atraumatic.  Eyes: EOM are normal. Pupils are equal, round, and reactive to light.  Neck: Neck supple. No thyromegaly present.  Cardiovascular: Normal rate, regular rhythm and intact distal pulses.  Exam reveals no gallop and no friction rub.   No murmur heard. Pulmonary/Chest: Effort normal and breath sounds normal. She has no wheezes. She has no rales.  Abdominal: Soft. There is no tenderness.  Musculoskeletal: Normal range of motion. She exhibits no edema.  Lymphadenopathy:    She has no cervical adenopathy.    Neurological: She is alert and oriented to person, place, and time.  Skin:       Multiple healing scars  Psychiatric:       tearful     Diagnostic Tests: CT chest 04/25/2012 *RADIOLOGY REPORT*  Clinical Data: Follow up of right lower lobe lung nodule, has a  history of left breast carcinoma in 2005  CT CHEST WITH CONTRAST  Technique: Multidetector CT imaging of the chest was performed  following the standard protocol during bolus administration of  intravenous contrast.  Contrast: 75mL OMNIPAQUE IOHEXOL 300 MG/ML SOLN  Comparison: CT chest of 01/05/2010 and 10/28/2008  Findings: The ground-glass opacity within the superior segment of  the right lower lobe has minimally increased in size, now measuring  16 x 13 x 22 mm compared to 15 x 13 x 18 mm on the CT from 2010.  Also, when compared to the initial CT from 12/04/2007, there is  more soft tissue nodularity associated with this lesion. This  lesion remains worrisome for a very slow growing low grade  adenocarcinoma.  No other lung lesion is seen on lung window images. No infiltrate  is noted. There is no evidence of pleural effusion.  On soft tissue window images, the thyroid gland is unremarkable.  The thoracic aorta and pulmonary arteries opacify with no  significant abnormality noted. Coronary artery calcifications are  present within the left anterior descending artery. Surgical clips  are noted in the lateral left breast. No mediastinal or hilar  adenopathy is seen. The upper abdomen is unremarkable.  IMPRESSION:  Slow increase in size and soft tissue component of the ground-  glass opacity within the superior segment of the right lower lobe,  suspicious for slow-growing low grade adenocarcinoma. Thoracic  surgery consult is recommended.  Impression: Deborah Mullins is a 61-year-old woman with a long-standing right lower lobe nodule. A CT done recently shows that this is slightly increased in size, and has definitely  increased in density since her last study. This lesion is highly suspicious for an adenocarcinoma in situ (BAC). The only way to definitively rule that out as with an excisional biopsy. I spent a great deal of time with the patient reviewing the CT scans going back to 2009 showing her the changes. I explained to her that her bronchoscopic or CT-guided biopsy could be falsely negative, therefore a negative result would not change recommendation to proceed with surgical excision.  A recommended to her that we proceed with a right VATS and right lower lobe superior segmentectomy. Given the size and location of the lesion a wedge resection would encompass the majority of the superior segment any way. Therefore I think proceeding with a superior segmentectomy from the start would be the best way to approach this lesion. I   think it would be adequate definitive therapy given the extremely slow growth of this lesion.  I discussed in detail with her the nature of the procedure, incisions be used, the need for general anesthesia, the general conduct of the operation, expected hospital stay and overall recovery. He also discussed in general terms expected outcomes. We discussed in detail the indications, risks, benefits, and alternatives. She understands the risk include, but are not limited to, death, stroke, MI, DVT, PE, bleeding, possible need for transfusion, infection, prolonged air leak, cardiac arrhythmias, as well as the possibility of unforeseeable complications. She accepts these risks and wishes to proceed.  Plan: Right VATS, right lower lobe superior segmentectomy on Thursday, October 10.  She needs pulmonary function tests preoperatively although I don't expect to find anything that would prohibit her from having the procedure done. 

## 2012-05-21 ENCOUNTER — Encounter (HOSPITAL_COMMUNITY)
Admission: RE | Admit: 2012-05-21 | Discharge: 2012-05-21 | Disposition: A | Discharge status: Home / Self Care | Payer: 59 | Source: Ambulatory Visit | Attending: Thoracic Surgery (Cardiothoracic Vascular Surgery) | Admitting: Thoracic Surgery (Cardiothoracic Vascular Surgery)

## 2012-05-21 ENCOUNTER — Inpatient Hospital Stay (HOSPITAL_COMMUNITY)
Admission: RE | Admit: 2012-05-21 | Discharge: 2012-05-21 | Disposition: A | Discharge status: Home / Self Care | Payer: Managed Care, Other (non HMO) | Source: Ambulatory Visit

## 2012-05-21 ENCOUNTER — Encounter (HOSPITAL_COMMUNITY): Payer: Self-pay

## 2012-05-21 VITALS — BP 148/97 | HR 98 | Temp 97.8°F | Resp 18

## 2012-05-21 DIAGNOSIS — D381 Neoplasm of uncertain behavior of trachea, bronchus and lung: Secondary | ICD-10-CM

## 2012-05-21 HISTORY — DX: Headache: R51

## 2012-05-21 HISTORY — DX: Other specified postprocedural states: Z98.890

## 2012-05-21 HISTORY — DX: Nausea with vomiting, unspecified: R11.2

## 2012-05-21 HISTORY — DX: Unspecified osteoarthritis, unspecified site: M19.90

## 2012-05-21 LAB — URINALYSIS, ROUTINE W REFLEX MICROSCOPIC
Glucose, UA: NEGATIVE mg/dL
Ketones, ur: NEGATIVE mg/dL
Leukocytes, UA: NEGATIVE
pH: 5 (ref 5.0–8.0)

## 2012-05-21 LAB — COMPREHENSIVE METABOLIC PANEL
AST: 27 U/L (ref 0–37)
BUN: 18 mg/dL (ref 6–23)
CO2: 30 mEq/L (ref 19–32)
Chloride: 100 mEq/L (ref 96–112)
Creatinine, Ser: 0.8 mg/dL (ref 0.50–1.10)
GFR calc Af Amer: >90 mL/min (ref >90)
GFR calc non Af Amer: 79 mL/min — ABNORMAL LOW (ref >90)
Glucose, Bld: 89 mg/dL (ref 70–99)
Total Bilirubin: 0.6 mg/dL (ref 0.3–1.2)

## 2012-05-21 LAB — CBC
MCV: 89.7 fL (ref 78.0–100.0)
Platelets: 273 10*3/uL (ref 150–400)
RBC: 4.48 MIL/uL (ref 3.87–5.11)
RDW: 12.6 % (ref 11.5–15.5)
WBC: 6.4 10*3/uL (ref 4.0–10.5)

## 2012-05-21 LAB — BLOOD GAS, ARTERIAL
Acid-Base Excess: 2.9 mmol/L — ABNORMAL HIGH (ref 0.0–2.0)
Drawn by: 222511
pCO2 arterial: 41.4 mmHg (ref 35.0–45.0)
pH, Arterial: 7.429 (ref 7.350–7.450)
pO2, Arterial: 89.1 mmHg (ref 80.0–100.0)

## 2012-05-21 LAB — APTT: aPTT: 35 seconds (ref 24–37)

## 2012-05-21 LAB — PULMONARY FUNCTION TEST

## 2012-05-21 LAB — PROTIME-INR: INR: 1.01 (ref 0.00–1.49)

## 2012-05-21 MED ORDER — ALBUTEROL SULFATE (5 MG/ML) 0.5% IN NEBU
2.5000 mg | INHALATION_SOLUTION | Freq: Once | RESPIRATORY_TRACT | Status: AC
  Administered 2012-05-21: 2.5 mg via RESPIRATORY_TRACT

## 2012-05-21 NOTE — Pre-Procedure Instructions (Signed)
20 NALISHA FULFER  05/21/2012   Your procedure is scheduled on:  Thursday, October 10th  Report to [facility] at 0530 AM.  Call this number if you have problems the morning of surgery: [phone]   Remember:   Do not eat food or drink:After Midnight.  Take these medicines the morning of surgery with A SIP OF WATER: dexilant, vicodin if needed   Do not wear jewelry, make-up or nail polish.  Do not wear lotions, powders, or perfumes.   Do not shave 48 hours prior to surgery. Men may shave face and neck.  Do not bring valuables to the hospital.  Contacts, dentures or bridgework may not be worn into surgery.  Leave suitcase in the car. After surgery it may be brought to your room.  For patients admitted to the hospital, checkout time is 11:00 AM the day of discharge.   Patients discharged the day of surgery will not be allowed to drive home.  Special Instructions: Shower using CHG 2 nights before surgery and the night before surgery.  If you shower the day of surgery use CHG.  Use special wash - you have one bottle of CHG for all showers.  You should use approximately 1/3 of the bottle for each shower.   Please read over the following fact sheets that you were given: Pain Booklet, Coughing and Deep Breathing, Blood Transfusion Information, MRSA Information and Surgical Site Infection Prevention

## 2012-05-21 NOTE — Progress Notes (Signed)
Primary Physician - Dr[facility]  Cardiologist in [place] - has been a few years since seeing Has had a stress test before unsure of how long ago or where she had it done.

## 2012-05-22 MED ORDER — VANCOMYCIN HCL IN DEXTROSE 1-5 GM/200ML-% IV SOLN
1000.0000 mg | INTRAVENOUS | Status: AC
  Administered 2012-05-23: 1000 mg via INTRAVENOUS
  Filled 2012-05-22: qty 200

## 2012-05-22 NOTE — Consult Note (Signed)
Anesthesia chart review: Patient is a 61 year old female scheduled for right VATS, right superior segmentectomy for definitive diagnosis of a RLL nodule on 05/23/2012 by Dr. Dorris Fetch. History includes former smoker, postoperative nausea vomiting, hypertension, hyperlipidemia, anxiety, headaches, GERD, chronic DOE, arthritis, breast cancer s/p left lumpectomy '05.  PCP is Thayer Headings.  Pulmonologist is Dr. Delton Coombes.  She was seen by Cardiologist Dr. Eden Emms in 2009 for atypical chest pain and had a stress test that was negative for ischemia, EF 65%.  CXR on 05/21/12 showed the nodular lesion seen in the right lower lobe on recent CT is quite subtle on chest radiograph. Elsewhere, lungs are clear. No adenopathy. PFTs on 05/21/12 showed an FEV1 2.02 (79% predicted).  EKG on 05/21/12 showed NSR, cannot rule out anterior infarct (age undetermined).  Overall, it appears stable when compared to her EKG from 05/01/07 (see Muse).  Nuclear stress test on 12/12/07 showed: Negative stress nuclear myocardial examination revealing impaired exercise capacity, a rapid increase in heart rate at low level exercise suggesting physical deconditioning, significant stress - induced EKG abnormalities that appear to represent a false positive response, normal left ventricular size and normal left ventricular systolic function, 65%. By scintigraphic imaging, there appeared to be prominent physiologic apical thinning without evidence for ischemia  or infarction.  Labs noted.  Anticipate she can proceed as planned.  Shonna Chock, PA-C

## 2012-05-23 ENCOUNTER — Inpatient Hospital Stay (HOSPITAL_COMMUNITY): Payer: 59

## 2012-05-23 ENCOUNTER — Inpatient Hospital Stay (HOSPITAL_COMMUNITY)
Admission: RE | Admit: 2012-05-23 | Discharge: 2012-05-26 | DRG: 165 | Disposition: A | Discharge status: Home / Self Care | Payer: 59 | Source: Ambulatory Visit | Attending: Thoracic Surgery (Cardiothoracic Vascular Surgery) | Admitting: Thoracic Surgery (Cardiothoracic Vascular Surgery)

## 2012-05-23 ENCOUNTER — Ambulatory Visit (HOSPITAL_COMMUNITY): Payer: 59 | Admitting: Vascular Surgery

## 2012-05-23 ENCOUNTER — Encounter (HOSPITAL_COMMUNITY): Payer: Self-pay | Admitting: Vascular Surgery

## 2012-05-23 ENCOUNTER — Encounter (HOSPITAL_COMMUNITY): Payer: Self-pay | Admitting: *Deleted

## 2012-05-23 ENCOUNTER — Encounter (HOSPITAL_COMMUNITY)
Admission: RE | Disposition: A | Discharge status: Home / Self Care | Payer: Self-pay | Source: Ambulatory Visit | Attending: Thoracic Surgery (Cardiothoracic Vascular Surgery)

## 2012-05-23 DIAGNOSIS — E876 Hypokalemia: Secondary | ICD-10-CM | POA: Diagnosis not present

## 2012-05-23 DIAGNOSIS — Z853 Personal history of malignant neoplasm of breast: Secondary | ICD-10-CM

## 2012-05-23 DIAGNOSIS — J984 Other disorders of lung: Secondary | ICD-10-CM

## 2012-05-23 DIAGNOSIS — C3430 Malignant neoplasm of lower lobe, unspecified bronchus or lung: Principal | ICD-10-CM | POA: Diagnosis present

## 2012-05-23 DIAGNOSIS — J45909 Unspecified asthma, uncomplicated: Secondary | ICD-10-CM | POA: Diagnosis present

## 2012-05-23 DIAGNOSIS — Z01812 Encounter for preprocedural laboratory examination: Secondary | ICD-10-CM

## 2012-05-23 DIAGNOSIS — F411 Generalized anxiety disorder: Secondary | ICD-10-CM | POA: Diagnosis present

## 2012-05-23 DIAGNOSIS — C3490 Malignant neoplasm of unspecified part of unspecified bronchus or lung: Secondary | ICD-10-CM | POA: Insufficient documentation

## 2012-05-23 DIAGNOSIS — D381 Neoplasm of uncertain behavior of trachea, bronchus and lung: Secondary | ICD-10-CM

## 2012-05-23 DIAGNOSIS — J309 Allergic rhinitis, unspecified: Secondary | ICD-10-CM | POA: Diagnosis present

## 2012-05-23 DIAGNOSIS — R51 Headache: Secondary | ICD-10-CM | POA: Diagnosis present

## 2012-05-23 DIAGNOSIS — I10 Essential (primary) hypertension: Secondary | ICD-10-CM | POA: Diagnosis present

## 2012-05-23 DIAGNOSIS — Z79899 Other long term (current) drug therapy: Secondary | ICD-10-CM

## 2012-05-23 DIAGNOSIS — Z87891 Personal history of nicotine dependence: Secondary | ICD-10-CM

## 2012-05-23 DIAGNOSIS — K219 Gastro-esophageal reflux disease without esophagitis: Secondary | ICD-10-CM | POA: Diagnosis present

## 2012-05-23 DIAGNOSIS — C50919 Malignant neoplasm of unspecified site of unspecified female breast: Secondary | ICD-10-CM | POA: Diagnosis present

## 2012-05-23 DIAGNOSIS — E7800 Pure hypercholesterolemia, unspecified: Secondary | ICD-10-CM | POA: Diagnosis present

## 2012-05-23 DIAGNOSIS — K5900 Constipation, unspecified: Secondary | ICD-10-CM | POA: Diagnosis not present

## 2012-05-23 DIAGNOSIS — E785 Hyperlipidemia, unspecified: Secondary | ICD-10-CM | POA: Diagnosis present

## 2012-05-23 DIAGNOSIS — Z01810 Encounter for preprocedural cardiovascular examination: Secondary | ICD-10-CM

## 2012-05-23 HISTORY — PX: LUNG CANCER SURGERY: SHX702

## 2012-05-23 SURGERY — VIDEO ASSISTED THORACOSCOPY (VATS)/ LOBECTOMY
Anesthesia: General | Site: Chest | Laterality: Right | Wound class: Clean Contaminated

## 2012-05-23 MED ORDER — VANCOMYCIN HCL IN DEXTROSE 1-5 GM/200ML-% IV SOLN
1000.0000 mg | Freq: Two times a day (BID) | INTRAVENOUS | Status: AC
  Administered 2012-05-23: 1000 mg via INTRAVENOUS
  Filled 2012-05-23: qty 200

## 2012-05-23 MED ORDER — HYDROMORPHONE HCL PF 1 MG/ML IJ SOLN
0.2500 mg | INTRAMUSCULAR | Status: DC | PRN
  Administered 2012-05-23 (×4): 0.5 mg via INTRAVENOUS

## 2012-05-23 MED ORDER — ROCURONIUM BROMIDE 100 MG/10ML IV SOLN
INTRAVENOUS | Status: DC | PRN
  Administered 2012-05-23: 10 mg via INTRAVENOUS
  Administered 2012-05-23: 50 mg via INTRAVENOUS
  Administered 2012-05-23 (×3): 10 mg via INTRAVENOUS

## 2012-05-23 MED ORDER — NALOXONE HCL 0.4 MG/ML IJ SOLN: 0.4000 mg | INTRAMUSCULAR | Status: DC | PRN

## 2012-05-23 MED ORDER — FENTANYL 10 MCG/ML IV SOLN
INTRAVENOUS | Status: DC
  Administered 2012-05-23: 14:00:00 via INTRAVENOUS
  Administered 2012-05-23: 105 ug via INTRAVENOUS
  Administered 2012-05-23 – 2012-05-24 (×2): 75 ug via INTRAVENOUS
  Administered 2012-05-24: 15 ug via INTRAVENOUS
  Administered 2012-05-24: 01:00:00 via INTRAVENOUS
  Administered 2012-05-24: 285 ug via INTRAVENOUS
  Administered 2012-05-24: 75 ug via INTRAVENOUS
  Administered 2012-05-24: 221 ug via INTRAVENOUS
  Administered 2012-05-24: 120 ug via INTRAVENOUS
  Administered 2012-05-25: 90 ug via INTRAVENOUS
  Administered 2012-05-25: 30 ug via INTRAVENOUS
  Administered 2012-05-25: 45 ug via INTRAVENOUS
  Filled 2012-05-23 (×4): qty 50

## 2012-05-23 MED ORDER — AMITRIPTYLINE HCL 25 MG PO TABS
25.0000 mg | ORAL_TABLET | Freq: Every day | ORAL | Status: DC
  Administered 2012-05-24 – 2012-05-25 (×2): 25 mg via ORAL
  Filled 2012-05-23 (×3): qty 1

## 2012-05-23 MED ORDER — ONDANSETRON HCL 4 MG/2ML IJ SOLN
INTRAMUSCULAR | Status: DC | PRN
  Administered 2012-05-23: 4 mg via INTRAVENOUS

## 2012-05-23 MED ORDER — HYDROCHLOROTHIAZIDE 25 MG PO TABS
25.0000 mg | ORAL_TABLET | Freq: Every day | ORAL | Status: DC
Start: 2012-05-24 — End: 2012-05-26
  Administered 2012-05-24 – 2012-05-26 (×3): 25 mg via ORAL
  Filled 2012-05-23 (×3): qty 1

## 2012-05-23 MED ORDER — ARTIFICIAL TEARS OP OINT
TOPICAL_OINTMENT | OPHTHALMIC | Status: DC | PRN
  Administered 2012-05-23: 1 via OPHTHALMIC

## 2012-05-23 MED ORDER — POTASSIUM CHLORIDE 10 MEQ/50ML IV SOLN
10.0000 meq | Freq: Every day | INTRAVENOUS | Status: DC | PRN
  Filled 2012-05-23: qty 150

## 2012-05-23 MED ORDER — LIDOCAINE HCL 4 % MT SOLN
OROMUCOSAL | Status: DC | PRN
  Administered 2012-05-23: 4 mL via TOPICAL

## 2012-05-23 MED ORDER — LIDOCAINE HCL (CARDIAC) 20 MG/ML IV SOLN
INTRAVENOUS | Status: DC | PRN
  Administered 2012-05-23: 90 mg via INTRAVENOUS

## 2012-05-23 MED ORDER — FENTANYL CITRATE 0.05 MG/ML IJ SOLN
INTRAMUSCULAR | Status: DC | PRN
  Administered 2012-05-23 (×6): 50 ug via INTRAVENOUS
  Administered 2012-05-23: 100 ug via INTRAVENOUS
  Administered 2012-05-23 (×5): 50 ug via INTRAVENOUS

## 2012-05-23 MED ORDER — PROPOFOL 10 MG/ML IV BOLUS
INTRAVENOUS | Status: DC | PRN
  Administered 2012-05-23: 150 mg via INTRAVENOUS
  Administered 2012-05-23: 50 mg via INTRAVENOUS

## 2012-05-23 MED ORDER — ATORVASTATIN CALCIUM 40 MG PO TABS
40.0000 mg | ORAL_TABLET | Freq: Every day | ORAL | Status: DC
  Administered 2012-05-24 – 2012-05-25 (×2): 40 mg via ORAL
  Filled 2012-05-23 (×3): qty 1

## 2012-05-23 MED ORDER — SODIUM CHLORIDE 0.9 % IJ SOLN: 9.0000 mL | INTRAMUSCULAR | Status: DC | PRN

## 2012-05-23 MED ORDER — OXYCODONE-ACETAMINOPHEN 5-325 MG PO TABS
1.0000 | ORAL_TABLET | ORAL | Status: DC | PRN
  Administered 2012-05-26: 1 via ORAL
  Filled 2012-05-23: qty 2
  Filled 2012-05-23: qty 1
  Filled 2012-05-23: qty 2

## 2012-05-23 MED ORDER — KETOROLAC TROMETHAMINE 30 MG/ML IJ SOLN
30.0000 mg | Freq: Four times a day (QID) | INTRAMUSCULAR | Status: AC
  Administered 2012-05-23 – 2012-05-25 (×7): 30 mg via INTRAVENOUS
  Filled 2012-05-23 (×6): qty 1

## 2012-05-23 MED ORDER — HYDROMORPHONE HCL PF 1 MG/ML IJ SOLN
INTRAMUSCULAR | Status: AC
  Filled 2012-05-23: qty 1

## 2012-05-23 MED ORDER — ONDANSETRON HCL 4 MG/2ML IJ SOLN: 4.0000 mg | Freq: Four times a day (QID) | INTRAMUSCULAR | Status: DC | PRN

## 2012-05-23 MED ORDER — OXYCODONE HCL 5 MG/5ML PO SOLN: 5.0000 mg | Freq: Once | ORAL | Status: DC | PRN

## 2012-05-23 MED ORDER — BISACODYL 5 MG PO TBEC
10.0000 mg | DELAYED_RELEASE_TABLET | Freq: Every day | ORAL | Status: DC
  Administered 2012-05-24 – 2012-05-26 (×3): 10 mg via ORAL
  Filled 2012-05-23 (×3): qty 2

## 2012-05-23 MED ORDER — MIDAZOLAM HCL 5 MG/5ML IJ SOLN
INTRAMUSCULAR | Status: DC | PRN
  Administered 2012-05-23: 2 mg via INTRAVENOUS

## 2012-05-23 MED ORDER — NEOSTIGMINE METHYLSULFATE 1 MG/ML IJ SOLN
INTRAMUSCULAR | Status: DC | PRN
  Administered 2012-05-23: 4 mg via INTRAVENOUS

## 2012-05-23 MED ORDER — DEXTROSE-NACL 5-0.9 % IV SOLN
INTRAVENOUS | Status: DC
  Administered 2012-05-23 – 2012-05-25 (×2): via INTRAVENOUS

## 2012-05-23 MED ORDER — LACTATED RINGERS IV SOLN
INTRAVENOUS | Status: DC | PRN
  Administered 2012-05-23 (×3): via INTRAVENOUS

## 2012-05-23 MED ORDER — DIPHENHYDRAMINE HCL 12.5 MG/5ML PO ELIX
12.5000 mg | ORAL_SOLUTION | Freq: Four times a day (QID) | ORAL | Status: DC | PRN
  Administered 2012-05-25: 12.5 mg via ORAL
  Filled 2012-05-23: qty 5

## 2012-05-23 MED ORDER — ACETAMINOPHEN 10 MG/ML IV SOLN
1000.0000 mg | Freq: Four times a day (QID) | INTRAVENOUS | Status: AC
  Administered 2012-05-23 – 2012-05-24 (×3): 1000 mg via INTRAVENOUS
  Filled 2012-05-23 (×4): qty 100

## 2012-05-23 MED ORDER — OXYCODONE-ACETAMINOPHEN 5-325 MG PO TABS
1.0000 | ORAL_TABLET | ORAL | Status: DC | PRN
  Administered 2012-05-24 – 2012-05-25 (×2): 2 via ORAL
  Administered 2012-05-25: 1 via ORAL
  Filled 2012-05-23: qty 1

## 2012-05-23 MED ORDER — OXYCODONE HCL 5 MG PO TABS: 5.0000 mg | ORAL_TABLET | Freq: Once | ORAL | Status: DC | PRN

## 2012-05-23 MED ORDER — HEMOSTATIC AGENTS (NO CHARGE) OPTIME
TOPICAL | Status: DC | PRN
  Administered 2012-05-23: 1 via TOPICAL

## 2012-05-23 MED ORDER — DIPHENHYDRAMINE HCL 50 MG/ML IJ SOLN: 12.5000 mg | Freq: Four times a day (QID) | INTRAMUSCULAR | Status: DC | PRN

## 2012-05-23 MED ORDER — OXYCODONE HCL 5 MG PO TABS
5.0000 mg | ORAL_TABLET | ORAL | Status: AC | PRN
  Administered 2012-05-23 – 2012-05-24 (×2): 5 mg via ORAL
  Filled 2012-05-23 (×2): qty 1

## 2012-05-23 MED ORDER — TRAMADOL HCL 50 MG PO TABS
50.0000 mg | ORAL_TABLET | Freq: Four times a day (QID) | ORAL | Status: DC | PRN
  Administered 2012-05-24 – 2012-05-25 (×2): 100 mg via ORAL
  Filled 2012-05-23 (×2): qty 2

## 2012-05-23 MED ORDER — SENNOSIDES-DOCUSATE SODIUM 8.6-50 MG PO TABS
1.0000 | ORAL_TABLET | Freq: Every evening | ORAL | Status: DC | PRN
  Filled 2012-05-23 (×2): qty 1

## 2012-05-23 MED ORDER — PANTOPRAZOLE SODIUM 40 MG PO TBEC
40.0000 mg | DELAYED_RELEASE_TABLET | Freq: Every day | ORAL | Status: DC
  Administered 2012-05-24 – 2012-05-25 (×2): 40 mg via ORAL
  Filled 2012-05-23 (×2): qty 1

## 2012-05-23 MED ORDER — ALBUTEROL SULFATE (5 MG/ML) 0.5% IN NEBU: 2.5000 mg | INHALATION_SOLUTION | RESPIRATORY_TRACT | Status: DC | PRN

## 2012-05-23 MED ORDER — FENTANYL 10 MCG/ML IV SOLN: INTRAVENOUS | Status: DC

## 2012-05-23 MED ORDER — GLYCOPYRROLATE 0.2 MG/ML IJ SOLN
INTRAMUSCULAR | Status: DC | PRN
  Administered 2012-05-23: 0.4 mg via INTRAVENOUS

## 2012-05-23 MED ORDER — MEPERIDINE HCL 25 MG/ML IJ SOLN: 6.2500 mg | INTRAMUSCULAR | Status: DC | PRN

## 2012-05-23 MED ORDER — PROMETHAZINE HCL 25 MG/ML IJ SOLN: 6.2500 mg | INTRAMUSCULAR | Status: DC | PRN

## 2012-05-23 SURGICAL SUPPLY — 78 items
ADH SKN CLS APL DERMABOND .7 (GAUZE/BANDAGES/DRESSINGS) ×2
APPLIER CLIP ROT 10 11.4 M/L (STAPLE) ×2
APR CLP MED LRG 11.4X10 (STAPLE) ×1
BAG SPEC RTRVL LRG 6X4 10 (ENDOMECHANICALS) ×2
BLADE SURG 11 STRL SS (BLADE) ×2 IMPLANT
CANISTER SUCTION 2500CC (MISCELLANEOUS) ×4 IMPLANT
CATH KIT ON Q 5IN SLV (PAIN MANAGEMENT) IMPLANT
CATH MALECOT BARD  28FR (CATHETERS) ×2 IMPLANT
CATH THORACIC 28FR (CATHETERS) ×2 IMPLANT
CATH THORACIC 28FR RT ANG (CATHETERS) ×2 IMPLANT
CATH THORACIC 36FR (CATHETERS) IMPLANT
CATH THORACIC 36FR RT ANG (CATHETERS) IMPLANT
CLIP APPLIE ROT 10 11.4 M/L (STAPLE) ×1 IMPLANT
CLIP TI MEDIUM 6 (CLIP) ×2 IMPLANT
CLOTH BEACON ORANGE TIMEOUT ST (SAFETY) ×2 IMPLANT
CONN Y 3/8X3/8X3/8  BEN (MISCELLANEOUS) ×3
CONN Y 3/8X3/8X3/8 BEN (MISCELLANEOUS) ×1 IMPLANT
CONT SPEC 4OZ CLIKSEAL STRL BL (MISCELLANEOUS) ×12 IMPLANT
DERMABOND ADVANCED (GAUZE/BANDAGES/DRESSINGS) ×2
DERMABOND ADVANCED .7 DNX12 (GAUZE/BANDAGES/DRESSINGS) ×2 IMPLANT
DRAPE LAPAROSCOPIC ABDOMINAL (DRAPES) ×2 IMPLANT
DRAPE WARM FLUID 44X44 (DRAPE) ×4 IMPLANT
ELECT REM PT RETURN 9FT ADLT (ELECTROSURGICAL) ×2
ELECTRODE REM PT RTRN 9FT ADLT (ELECTROSURGICAL) ×1 IMPLANT
GLOVE BIO SURGEON STRL SZ7.5 (GLOVE) ×2 IMPLANT
GLOVE BIOGEL PI IND STRL 6 (GLOVE) ×1 IMPLANT
GLOVE BIOGEL PI INDICATOR 6 (GLOVE) ×1
GLOVE EUDERMIC 7 POWDERFREE (GLOVE) ×4 IMPLANT
GOWN PREVENTION PLUS XLARGE (GOWN DISPOSABLE) ×4 IMPLANT
GOWN STRL NON-REIN LRG LVL3 (GOWN DISPOSABLE) ×4 IMPLANT
HANDLE STAPLE ENDO GIA SHORT (STAPLE) ×1
HEMOSTAT SURGICEL 2X14 (HEMOSTASIS) ×2 IMPLANT
KIT BASIN OR (CUSTOM PROCEDURE TRAY) ×2 IMPLANT
KIT ROOM TURNOVER OR (KITS) ×2 IMPLANT
KIT SUCTION CATH 14FR (SUCTIONS) ×2 IMPLANT
NS IRRIG 1000ML POUR BTL (IV SOLUTION) ×6 IMPLANT
PACK CHEST (CUSTOM PROCEDURE TRAY) ×2 IMPLANT
PAD ARMBOARD 7.5X6 YLW CONV (MISCELLANEOUS) ×4 IMPLANT
POUCH ENDO CATCH II 15MM (MISCELLANEOUS) IMPLANT
POUCH SPECIMEN RETRIEVAL 10MM (ENDOMECHANICALS) ×2 IMPLANT
RELOAD EGIA 45 MED/THCK PURPLE (STAPLE) ×10 IMPLANT
RELOAD EGIA 45 TAN VASC (STAPLE) ×12 IMPLANT
RELOAD EGIA 60 MED/THCK PURPLE (STAPLE) ×4 IMPLANT
RELOAD EGIA TRIS TAN 45 CVD (STAPLE) ×4 IMPLANT
RELOAD STAPLE 60 MED/THCK ART (STAPLE) IMPLANT
SEALANT PROGEL (MISCELLANEOUS) IMPLANT
SEALANT SURG COSEAL 4ML (VASCULAR PRODUCTS) IMPLANT
SEALANT SURG COSEAL 8ML (VASCULAR PRODUCTS) IMPLANT
SOLUTION ANTI FOG 6CC (MISCELLANEOUS) ×4 IMPLANT
SPECIMEN JAR MEDIUM (MISCELLANEOUS) ×2 IMPLANT
SPONGE GAUZE 4X4 12PLY (GAUZE/BANDAGES/DRESSINGS) ×2 IMPLANT
SPONGE INTESTINAL PEANUT (DISPOSABLE) IMPLANT
STAPLER ENDO GIA 12 SHRT THIN (STAPLE) IMPLANT
STAPLER ENDO GIA 12MM SHORT (STAPLE) ×1 IMPLANT
SUT PROLENE 4 0 RB 1 (SUTURE)
SUT PROLENE 4-0 RB1 .5 CRCL 36 (SUTURE) IMPLANT
SUT SILK  1 MH (SUTURE) ×3
SUT SILK 1 MH (SUTURE) ×3 IMPLANT
SUT SILK 2 0SH CR/8 30 (SUTURE) ×2 IMPLANT
SUT SILK 3 0SH CR/8 30 (SUTURE) IMPLANT
SUT VIC AB 1 CTX 36 (SUTURE) ×2
SUT VIC AB 1 CTX36XBRD ANBCTR (SUTURE) IMPLANT
SUT VIC AB 2-0 CTX 36 (SUTURE) ×2 IMPLANT
SUT VIC AB 2-0 UR6 27 (SUTURE) ×1 IMPLANT
SUT VIC AB 3-0 MH 27 (SUTURE) IMPLANT
SUT VIC AB 3-0 X1 27 (SUTURE) ×4 IMPLANT
SUT VICRYL 2 TP 1 (SUTURE) IMPLANT
SWAB COLLECTION DEVICE MRSA (MISCELLANEOUS) IMPLANT
SYSTEM SAHARA CHEST DRAIN ATS (WOUND CARE) ×2 IMPLANT
TAPE CLOTH 4X10 WHT NS (GAUZE/BANDAGES/DRESSINGS) ×2 IMPLANT
TAPE CLOTH SURG 4X10 WHT LF (GAUZE/BANDAGES/DRESSINGS) ×2 IMPLANT
TIP APPLICATOR SPRAY EXTEND 16 (VASCULAR PRODUCTS) IMPLANT
TOWEL OR 17X24 6PK STRL BLUE (TOWEL DISPOSABLE) ×2 IMPLANT
TOWEL OR 17X26 10 PK STRL BLUE (TOWEL DISPOSABLE) ×2 IMPLANT
TRAP SPECIMEN MUCOUS 40CC (MISCELLANEOUS) IMPLANT
TRAY FOLEY CATH 14FRSI W/METER (CATHETERS) ×2 IMPLANT
TUBE ANAEROBIC SPECIMEN COL (MISCELLANEOUS) IMPLANT
WATER STERILE IRR 1000ML POUR (IV SOLUTION) ×4 IMPLANT

## 2012-05-23 NOTE — Transfer of Care (Signed)
Immediate Anesthesia Transfer of Care Note  Patient: Deborah Mullins  Procedure(s) Performed: Procedure(s) (LRB) with comments: VIDEO ASSISTED THORACOSCOPY (VATS)/ LOBECTOMY (Right)  Patient Location: PACU  Anesthesia Type: General  Level of Consciousness: awake, alert  and oriented  Airway & Oxygen Therapy: Patient Spontanous Breathing and Patient connected to face mask oxygen  Post-op Assessment: Report given to PACU RN, Post -op Vital signs reviewed and stable and Patient moving all extremities X 4  Post vital signs: Reviewed and stable  Complications: No apparent anesthesia complications

## 2012-05-23 NOTE — Anesthesia Postprocedure Evaluation (Signed)
  Anesthesia Post-op Note  Patient: Deborah Mullins  Procedure(s) Performed: Procedure(s) (LRB) with comments: VIDEO ASSISTED THORACOSCOPY (VATS)/ LOBECTOMY (Right)  Patient Location: PACU  Anesthesia Type: General  Level of Consciousness: awake  Airway and Oxygen Therapy: Patient Spontanous Breathing  Post-op Pain: moderate  Post-op Assessment: Post-op Vital signs reviewed  Post-op Vital Signs: stable  Complications: No apparent anesthesia complications

## 2012-05-23 NOTE — H&P (View-Only) (Signed)
PCP is Thayer Headings, MD Referring Provider is Leslye Peer, MD  Chief Complaint  Patient presents with  . Lung Lesion    Referral from Dr Delton Coombes for surgical eval on Rt lower lobe nodule, Chest CT 04/25/12, PET Scan pending    HPI: 61 year old female presents for evaluation of her right lung nodule.  [name] is a 61 year old woman with a remote history of light tobacco abuse, who was found and 2009 to have a right lower lobe lung nodule. This was a groundglass opacity. She had a PET scan at one point which showed no hypermetabolic uptake. She had been evaluated for surgery in 2011, but ultimately did not have it done. She recently had a repeat CT which showed an increase in size and density of the nodule. She was referred to Dr. Delton Coombes. He recommended she undergo excisional biopsy.  Says her breathing is been stable. She does short of breath if she engages in very heavy exertion. She's not had any chest pain pressure or tightness. She has an occasional cough, usually nonproductive. She's not had any hemoptysis. She's not had any weight loss.   Past Medical History  Diagnosis Date  . Hypertension   . Cancer     breast   . Hyperlipidemia   . GERD (gastroesophageal reflux disease)   . Anxiety   . Allergic rhinitis   . Lung nodule 2009  . Asthma     Past Surgical History  Procedure Date  . Breast surgery 2005    lumpectomy  . Tubal ligation   . Tonsillectomy   . Cesarean section     Family History  Problem Relation Age of Onset  . Heart disease Mother     CHF  . Cancer Sister     Social History History  Substance Use Topics  . Smoking status: Former Smoker -- 1.0 packs/day for 10 years    Types: Cigarettes    Quit date: 08/14/1974  . Smokeless tobacco: Never Used  . Alcohol Use: No    Current Outpatient Prescriptions  Medication Sig Dispense Refill  . amitriptyline (ELAVIL) 25 MG tablet Take 25 mg by mouth at bedtime.      Marland Kitchen dexlansoprazole (DEXILANT) 60 MG  capsule Take 60 mg by mouth daily.      . hydrochlorothiazide (HYDRODIURIL) 25 MG tablet Take 25 mg by mouth daily.      Marland Kitchen HYDROcodone-acetaminophen (NORCO/VICODIN) 5-325 MG per tablet Take 1 tablet by mouth daily.      . simvastatin (ZOCOR) 80 MG tablet Take 80 mg by mouth at bedtime.        Allergies  Allergen Reactions  . Potassium-Containing Compounds   . Penicillins Rash    Review of Systems  Constitutional: Positive for unexpected weight change (weight gain). Negative for activity change, appetite change and fatigue.  Eyes: Positive for visual disturbance (blurred vision).  Respiratory: Positive for shortness of breath (with heavy exertion). Negative for chest tightness.   Cardiovascular: Negative for chest pain and leg swelling.  Gastrointestinal:       Constipation, blood in stools, reflux   Genitourinary: Positive for frequency.  Musculoskeletal: Positive for joint swelling and arthralgias.       Myalgias  Neurological: Positive for numbness and headaches.       Memory problems  Psychiatric/Behavioral: Positive for dysphoric mood.  All other systems reviewed and are negative.    BP 132/88  Pulse 90  Resp 18  Ht 5\' 3"  (1.6 m)  Wt 140 lb (  63.504 kg)  BMI 24.80 kg/m2  SpO2 96% Physical Exam  Vitals reviewed. Constitutional: She is oriented to person, place, and time. She appears well-developed and well-nourished.  HENT:  Head: Normocephalic and atraumatic.  Eyes: EOM are normal. Pupils are equal, round, and reactive to light.  Neck: Neck supple. No thyromegaly present.  Cardiovascular: Normal rate, regular rhythm and intact distal pulses.  Exam reveals no gallop and no friction rub.   No murmur heard. Pulmonary/Chest: Effort normal and breath sounds normal. She has no wheezes. She has no rales.  Abdominal: Soft. There is no tenderness.  Musculoskeletal: Normal range of motion. She exhibits no edema.  Lymphadenopathy:    She has no cervical adenopathy.    Neurological: She is alert and oriented to person, place, and time.  Skin:       Multiple healing scars  Psychiatric:       tearful     Diagnostic Tests: CT chest 04/25/2012 *RADIOLOGY REPORT*  Clinical Data: Follow up of right lower lobe lung nodule, has a  history of left breast carcinoma in 2005  CT CHEST WITH CONTRAST  Technique: Multidetector CT imaging of the chest was performed  following the standard protocol during bolus administration of  intravenous contrast.  Contrast: 75mL OMNIPAQUE IOHEXOL 300 MG/ML SOLN  Comparison: CT chest of 01/05/2010 and 10/28/2008  Findings: The ground-glass opacity within the superior segment of  the right lower lobe has minimally increased in size, now measuring  16 x 13 x 22 mm compared to 15 x 13 x 18 mm on the CT from 2010.  Also, when compared to the initial CT from 12/04/2007, there is  more soft tissue nodularity associated with this lesion. This  lesion remains worrisome for a very slow growing low grade  adenocarcinoma.  No other lung lesion is seen on lung window images. No infiltrate  is noted. There is no evidence of pleural effusion.  On soft tissue window images, the thyroid gland is unremarkable.  The thoracic aorta and pulmonary arteries opacify with no  significant abnormality noted. Coronary artery calcifications are  present within the left anterior descending artery. Surgical clips  are noted in the lateral left breast. No mediastinal or hilar  adenopathy is seen. The upper abdomen is unremarkable.  IMPRESSION:  Slow increase in size and soft tissue component of the ground-  glass opacity within the superior segment of the right lower lobe,  suspicious for slow-growing low grade adenocarcinoma. Thoracic  surgery consult is recommended.  Impression: Deborah Mullins is a 61 year old woman with a long-standing right lower lobe nodule. A CT done recently shows that this is slightly increased in size, and has definitely  increased in density since her last study. This lesion is highly suspicious for an adenocarcinoma in situ (BAC). The only way to definitively rule that out as with an excisional biopsy. I spent a great deal of time with the patient reviewing the CT scans going back to 2009 showing her the changes. I explained to her that her bronchoscopic or CT-guided biopsy could be falsely negative, therefore a negative result would not change recommendation to proceed with surgical excision.  A recommended to her that we proceed with a right VATS and right lower lobe superior segmentectomy. Given the size and location of the lesion a wedge resection would encompass the majority of the superior segment any way. Therefore I think proceeding with a superior segmentectomy from the start would be the best way to approach this lesion. I  think it would be adequate definitive therapy given the extremely slow growth of this lesion.  I discussed in detail with her the nature of the procedure, incisions be used, the need for general anesthesia, the general conduct of the operation, expected hospital stay and overall recovery. He also discussed in general terms expected outcomes. We discussed in detail the indications, risks, benefits, and alternatives. She understands the risk include, but are not limited to, death, stroke, MI, DVT, PE, bleeding, possible need for transfusion, infection, prolonged air leak, cardiac arrhythmias, as well as the possibility of unforeseeable complications. She accepts these risks and wishes to proceed.  Plan: Right VATS, right lower lobe superior segmentectomy on Thursday, October 10.  She needs pulmonary function tests preoperatively although I don't expect to find anything that would prohibit her from having the procedure done.

## 2012-05-23 NOTE — Anesthesia Preprocedure Evaluation (Addendum)
Anesthesia Evaluation  Patient identified by MRN, date of birth, ID band Patient awake    Reviewed: Allergy & Precautions, H&P , NPO status , Patient's Chart, lab work & pertinent test results  History of Anesthesia Complications (+) PONV  Airway Mallampati: II TM Distance: >3 FB Neck ROM: Full    Dental  (+) Teeth Intact and Dental Advisory Given   Pulmonary shortness of breath,  breath sounds clear to auscultation        Cardiovascular hypertension, Rhythm:Regular Rate:Normal     Neuro/Psych  Headaches,    GI/Hepatic Neg liver ROS, GERD-  ,  Endo/Other  negative endocrine ROS  Renal/GU negative Renal ROS     Musculoskeletal   Abdominal   Peds  Hematology negative hematology ROS (+)   Anesthesia Other Findings   Reproductive/Obstetrics                          Anesthesia Physical Anesthesia Plan  ASA: III  Anesthesia Plan: General   Post-op Pain Management:    Induction: Intravenous  Airway Management Planned: Double Lumen EBT  Additional Equipment: Arterial line and CVP  Intra-op Plan:   Post-operative Plan: Extubation in OR  Informed Consent: I have reviewed the patients History and Physical, chart, labs and discussed the procedure including the risks, benefits and alternatives for the proposed anesthesia with the patient or authorized representative who has indicated his/her understanding and acceptance.     Plan Discussed with: CRNA and Surgeon  Anesthesia Plan Comments:         Anesthesia Quick Evaluation

## 2012-05-23 NOTE — Interval H&P Note (Signed)
History and Physical Interval Note:  05/23/2012 8:23 AM  Deborah Mullins  has presented today for surgery, with the diagnosis of RIGHT LOWER LOBE MASS  The various methods of treatment have been discussed with the patient and family. After consideration of risks, benefits and other options for treatment, the patient has consented to  Procedure(s) (LRB) with comments: VIDEO ASSISTED THORACOSCOPY (Right) - RIGHT VATS, RIGHT SUPER SEGMENTECTOMY as a surgical intervention .  The patient's history has been reviewed, patient examined, no change in status, stable for surgery.  I have reviewed the patient's chart and labs.  Questions were answered to the patient's satisfaction.     [name]

## 2012-05-23 NOTE — Consult Note (Signed)
Name: LUKE RIGSBEE MRN: [id number] DOB: [birth year]    LOS: 0  Referring Provider:  Dr Dorris Fetch Reason for Referral:  New dx adenoCA, s/p RLL lobectomy  PULMONARY / CRITICAL CARE MEDICINE  HPI:  61 yo woman, former smoker (10 pk-yrs) , hx of breast CA s/p surgery 2005 + XRT, HTN, allergic rhinitis. Referred to me by Dr Wilford Grist to address RLL nodule. This was first discovered by CT scan in 2009. Last CT/PET was done 01/2010 >> shows 0.8 x 1.5cm RLL perivascular nodule with ground glass and more solid components. She was been seen by both Dr Sherene Sires and Dr Edwyna Shell regarding the lesion, with concern that it needed to be resected due to suspicion for BAC. She had 2nd opinion in [name], PET done 01/2010 was not hypermetabolic and so surgery was cancelled. Repeat CT scan in [place] on 04/25/12 showed slight enlargement and more solid appearance. She was referred to Dr Dorris Fetch for possible excisional bx. Admitted now to SICU s/p RLL lobectomy, extubated, on PCA. Initial path consistent with adenoCA, probably lung primary. She is not on any BD's as an outpt.    Past Medical History  Diagnosis Date  . Hypertension   . Cancer     breast   . Hyperlipidemia   . GERD (gastroesophageal reflux disease)   . Anxiety   . Allergic rhinitis   . Lung nodule 2009  . PONV (postoperative nausea and vomiting)   . Shortness of breath     exertion  . Headache   . Arthritis    Past Surgical History  Procedure Date  . Tubal ligation   . Tonsillectomy   . Cesarean section   . Breast surgery 2005    lumpectomy - left   Prior to Admission medications   Medication Sig Start Date End Date Taking? Authorizing Provider  amitriptyline (ELAVIL) 25 MG tablet Take 25 mg by mouth at bedtime.   Yes Historical Provider, MD  dexlansoprazole (DEXILANT) 60 MG capsule Take 60 mg by mouth daily.   Yes Historical Provider, MD  hydrochlorothiazide (HYDRODIURIL) 25 MG tablet Take 25 mg by mouth daily.   Yes Historical  Provider, MD  HYDROcodone-acetaminophen (NORCO/VICODIN) 5-325 MG per tablet Take 1 tablet by mouth daily.   Yes Historical Provider, MD  simvastatin (ZOCOR) 80 MG tablet Take 80 mg by mouth at bedtime.   Yes Historical Provider, MD   Allergies Allergies  Allergen Reactions  . Bee Venom Anaphylaxis  . Penicillins Rash    Family History Family History  Problem Relation Age of Onset  . Heart disease Mother     CHF  . Cancer Sister    Social History  reports that she quit smoking about 37 years ago. Her smoking use included Cigarettes. She has a 10 pack-year smoking history. She has never used smokeless tobacco. She reports that she does not drink alcohol or use illicit drugs.  Review Of Systems:  Positive for cough, pleuritic pain w cough  Brief patient description:  61 yo woman, remote tobacco, s/p VATS RLL lobectomy for slow-growing GG nodule. Path consistent w adenoCA.   Events Since Admission: 10/10 s/p VATS RL lobectomy  Current Status Stable  Vital Signs: Temp:  [97.2 F (36.2 C)-97.6 F (36.4 C)] 97.4 F (36.3 C) (10/10 1330) Pulse Rate:  [86-110] 110  (10/10 1500) Resp:  [11-25] 11  (10/10 1500) BP: (129-176)/(81-89) 138/85 mmHg (10/10 1500) SpO2:  [99 %-100 %] 100 % (10/10 1500) Arterial Line BP: (177-193)/(85-94) 185/91 mmHg (10/10  1500) Weight:  [63.5 kg (139 lb 15.9 oz)] 63.5 kg (139 lb 15.9 oz) (10/10 1400)  Physical Examination: General:  Comfortable, sleepy Neuro:  Wakes to voice, a bit lethargic on PCA, moves all ext HEENT:  Op clear, PERRL, hoarse voice Neck:   No stridor Cardiovascular:  RRR no M, no edema Lungs:  Coarse, R CT to suction with no airleak Abdomen:  Soft, NT, hypoactive BS Musculoskeletal:  No deformities Skin:  No rash   Lab 05/21/12 1414  NA 139  K 3.5  CL 100  CO2 30  GLUCOSE 89  BUN 18  CREATININE 0.80  CALCIUM 10.2  MG --  PHOS --    Lab 05/21/12 1414  HGB 13.5  HCT 40.2  WBC 6.4  PLT 273    Lab 05/21/12 1414   WBC 6.4    Lab 05/21/12 1414  AST 27  ALT 23  ALKPHOS 67  BILITOT 0.6  PROT 7.6  ALBUMIN 4.5  INR 1.01    Lab 05/21/12 1448  PHART 7.429  PCO2ART 41.4  PO2ART 89.1  HCO3 26.9*  TCO2 28.2  O2SAT 97.2   Dg Chest Portable 1 View  05/23/2012  *RADIOLOGY REPORT*  Clinical Data: Postop VATS, right IJ line placement  PORTABLE CHEST - 1 VIEW  Comparison: 05/21/2012  Findings: Volume loss in the right lung.  Two right apical chest tubes.  No pneumothorax is seen.  Left lung is clear.  Interval placement of a right IJ venous catheter with its tip at the cavoatrial junction.  The heart is top normal in size.  IMPRESSION: Interval placement of a right IJ venous catheter with its tip at the cavoatrial junction.  Two right apical chest tubes.  No pneumothorax is seen.   Original Report Authenticated By: Charline Bills, M.D.      Active Problems:  ADENOCARCINOMA, BREAST  HYPERCHOLESTEROLEMIA  PULMONARY NODULE  GASTROESOPHAGEAL REFLUX DISEASE   ASSESSMENT AND PLAN 1. RLL nodule, presumed primary AdenoCA - await final path - if adeno, would send for EGFR mutation and Alk-1 rearrangement to guide potential future therapy (may never be needed if this was 1A disease)  2. S/p RLL lobectomy - IS, pulmonary hygiene - would probably benefit from BD's in peri-op period given remote tobacco hx. Not clear that she needs these for home. Can address as an out patient - appreciate Dr Hendrickson's care  3. Hyperlipidemia - atorvastatin   4. GERD  - pantoprazole   BEST PRACTICE / DISPOSITION Level of Care:  ICU Primary Service:  TCTS Consultants:  PCCM Code Status:  Full Diet:  NPO DVT Px:  SCD GI Px:  PPI Skin Integrity:  good Social / Family:    Leslye Peer., M.D. Pulmonary and Critical Care Medicine [facility] Pager: [phone]  05/23/2012, 3:35 PM

## 2012-05-23 NOTE — Progress Notes (Signed)
Patient ID: Deborah Mullins, female   DOB: Feb 26, 1951, 61 y.o.   MRN: [id number]                   [address]            [place] [address]          [phone]     Day of Surgery Procedure(s) (LRB): VIDEO ASSISTED THORACOSCOPY (VATS)/ LOBECTOMY (Right)  Total Length of Stay:  LOS: 0 days  BP 135/82  Pulse 116  Temp 97.3 F (36.3 C) (Oral)  Resp 14  Ht 5\' 4"  (1.626 m)  Wt 139 lb 15.9 oz (63.5 kg)  BMI 24.03 kg/m2  SpO2 100%  .Intake/Output      10/09 0701 - 10/10 0700 10/10 0701 - 10/11 0700   I.V. (mL/kg)  2326.3 (36.6)   IV Piggyback  100   Total Intake(mL/kg)  2426.3 (38.2)   Urine (mL/kg/hr)  1025 (1.4)   Blood  50   Chest Tube  250   Total Output  1325   Net  +1101.3             . dextrose 5 % and 0.9% NaCl 125 mL/hr at 05/23/12 1527     Lab Results  Component Value Date   WBC 6.4 05/21/2012   HGB 13.5 05/21/2012   HCT 40.2 05/21/2012   PLT 273 05/21/2012   GLUCOSE 89 05/21/2012   ALT 23 05/21/2012   AST 27 05/21/2012   NA 139 05/21/2012   K 3.5 05/21/2012   CL 100 05/21/2012   CREATININE 0.80 05/21/2012   BUN 18 05/21/2012   CO2 30 05/21/2012   INR 1.01 05/21/2012   Stable post op 250 from ct  no air leak Good resp effort Pain control adquate   Delight Ovens MD  Beeper [phone] Office [phone] 05/23/2012 6:41 PM

## 2012-05-23 NOTE — Brief Op Note (Addendum)
                   [address]            [place] [address]          [phone]    05/23/2012  11:30 AM  PATIENT:  Job Founds  61 y.o. female  PRE-OPERATIVE DIAGNOSIS:  RIGHT LOWER LOBE MASS  POST-OPERATIVE DIAGNOSIS:  RIGHT LOWER LOBE MASS, Adenocarcinoma  PROCEDURE:  Procedure(s): VIDEO ASSISTED THORACOSCOPY (VATS) RIGHT LOWER LOBE SUPERIOR SEGMENTECTOMY/COMPLETION LOBECTOMY LN SAMPLING  SURGEON:  Surgeon(s): Loreli Slot, MD  PHYSICIAN ASSISTANT: WAYNE GOLD PA-C  ANESTHESIA:   general  SPECIMEN:  Source of Specimen:  RLL, MULTIPLE LN'S  DISPOSITION OF SPECIMEN:  Pathology  DRAINS: 1 Chest Tube(s) in the RIGHT HEMITHORAX and (1 ) Blake drain(s) in the RIGHT HEMITHORAX   PATIENT CONDITION:  PACU - hemodynamically stable.  PRE-OPERATIVE WEIGHT: 63.5kg  FROZEN : ADENOCARCINOMA, + tumor at margin- therefore proceeded with lobectomy  COMPLICATIONS: NO KNOWN

## 2012-05-24 ENCOUNTER — Inpatient Hospital Stay (HOSPITAL_COMMUNITY): Payer: 59

## 2012-05-24 DIAGNOSIS — J984 Other disorders of lung: Secondary | ICD-10-CM

## 2012-05-24 LAB — BASIC METABOLIC PANEL
Calcium: 8.7 mg/dL (ref 8.4–10.5)
Chloride: 108 mEq/L (ref 96–112)
Creatinine, Ser: 0.71 mg/dL (ref 0.50–1.10)
GFR calc Af Amer: >90 mL/min (ref >90)
GFR calc non Af Amer: >90 mL/min (ref >90)

## 2012-05-24 LAB — CBC
HCT: 34.3 % — ABNORMAL LOW (ref 36.0–46.0)
Hemoglobin: 11.1 g/dL — ABNORMAL LOW (ref 12.0–15.0)
MCHC: 32.4 g/dL (ref 30.0–36.0)
RBC: 3.75 MIL/uL — ABNORMAL LOW (ref 3.87–5.11)

## 2012-05-24 MED ORDER — POTASSIUM CHLORIDE 10 MEQ/50ML IV SOLN: 10.0000 meq | INTRAVENOUS | Status: DC

## 2012-05-24 MED ORDER — ALBUTEROL SULFATE (5 MG/ML) 0.5% IN NEBU
2.5000 mg | INHALATION_SOLUTION | Freq: Four times a day (QID) | RESPIRATORY_TRACT | Status: DC
  Administered 2012-05-24: 2.5 mg via RESPIRATORY_TRACT
  Filled 2012-05-24 (×2): qty 0.5

## 2012-05-24 MED ORDER — ENOXAPARIN SODIUM 40 MG/0.4ML ~~LOC~~ SOLN
40.0000 mg | SUBCUTANEOUS | Status: DC
  Administered 2012-05-24 – 2012-05-26 (×3): 40 mg via SUBCUTANEOUS
  Filled 2012-05-24 (×4): qty 0.4

## 2012-05-24 MED ORDER — ALBUTEROL SULFATE (5 MG/ML) 0.5% IN NEBU
2.5000 mg | INHALATION_SOLUTION | RESPIRATORY_TRACT | Status: DC | PRN
  Administered 2012-05-24: 2.5 mg via RESPIRATORY_TRACT

## 2012-05-24 MED ORDER — POTASSIUM CHLORIDE 10 MEQ/50ML IV SOLN
10.0000 meq | INTRAVENOUS | Status: AC
  Administered 2012-05-24 (×3): 10 meq via INTRAVENOUS

## 2012-05-24 MED ORDER — POTASSIUM CHLORIDE 10 MEQ/50ML IV SOLN
10.0000 meq | INTRAVENOUS | Status: AC
  Administered 2012-05-24 (×2): 10 meq via INTRAVENOUS
  Filled 2012-05-24: qty 50

## 2012-05-24 MED ORDER — IPRATROPIUM BROMIDE 0.02 % IN SOLN
0.5000 mg | Freq: Four times a day (QID) | RESPIRATORY_TRACT | Status: DC
  Administered 2012-05-24: 0.5 mg via RESPIRATORY_TRACT
  Filled 2012-05-24 (×2): qty 2.5

## 2012-05-24 MED ORDER — IPRATROPIUM BROMIDE 0.02 % IN SOLN
0.5000 mg | Freq: Four times a day (QID) | RESPIRATORY_TRACT | Status: DC
  Administered 2012-05-24 – 2012-05-26 (×4): 0.5 mg via RESPIRATORY_TRACT
  Filled 2012-05-24 (×5): qty 2.5

## 2012-05-24 MED ORDER — ALBUTEROL SULFATE (5 MG/ML) 0.5% IN NEBU
2.5000 mg | INHALATION_SOLUTION | Freq: Four times a day (QID) | RESPIRATORY_TRACT | Status: DC
  Administered 2012-05-24 – 2012-05-26 (×4): 2.5 mg via RESPIRATORY_TRACT
  Filled 2012-05-24 (×6): qty 0.5

## 2012-05-24 MED ORDER — IPRATROPIUM BROMIDE 0.02 % IN SOLN
0.5000 mg | RESPIRATORY_TRACT | Status: DC | PRN
  Administered 2012-05-24: 0.5 mg via RESPIRATORY_TRACT
  Filled 2012-05-24: qty 2.5

## 2012-05-24 NOTE — Progress Notes (Signed)
Name: Deborah Mullins MRN: [id number] DOB: 1951/06/30    LOS: 1  Referring Provider:  Dr Dorris Fetch Reason for Referral:  New dx adenoCA, s/p RLL lobectomy Primary Pulmonary: Dr Sandrea Hughs PCP is Thayer Headings, MD   PULMONARY / CRITICAL CARE MEDICINE  HPI:  61 yo woman, former smoker (10 pk-yrs) , hx of breast CA s/p surgery 2005 + XRT, HTN, allergic rhinitis. Referred to me by Dr Wilford Grist to address RLL nodule. This was first discovered by CT scan in 2009. Last CT/PET was done 01/2010 >> shows 0.8 x 1.5cm RLL perivascular nodule with ground glass and more solid components. She was been seen by both Dr Sherene Sires and Dr Edwyna Shell regarding the lesion, with concern that it needed to be resected due to suspicion for BAC. She had 2nd opinion in [name], PET done 01/2010 was not hypermetabolic and so surgery was cancelled. Repeat CT scan in [place] on 04/25/12 showed slight enlargement and more solid appearance. She was referred to Dr Dorris Fetch for possible excisional bx. Admitted now to SICU s/p RLL lobectomy, extubated, on PCA. Initial path consistent with adenoCA, probably lung primary. She is not on any BD's as an outpt.    Events Since Admission: 10/10 s/p VATS RL lobectomy - surgical stage 1A   SUBJECTIVE/OVERNIGHT/INTERVAL HX  Sitting in chair post op. Feeling well other than pain at incisiion site and inability to complete cough. Still has foley  Vital Signs: Temp:  [97.2 F (36.2 C)-98.3 F (36.8 C)] 97.6 F (36.4 C) (10/11 0415) Pulse Rate:  [87-121] 100  (10/11 0925) Resp:  [11-29] 29  (10/11 0925) BP: (109-176)/(67-89) 123/77 mmHg (10/11 0900) SpO2:  [92 %-100 %] 92 % (10/11 0925) Arterial Line BP: (129-195)/(63-96) 135/79 mmHg (10/11 0900) Weight:  [63.5 kg (139 lb 15.9 oz)-64.864 kg (143 lb)] 64.864 kg (143 lb) (10/11 4098)  Physical Examination: General:  Sitting in chair. Looks well Neuro:  GCS 15. CAM-ICU neg for delirum. RASS +1. Speech normal.  Moves all 4s HEENT:  Op  clear, PERRL, hoarse voice Neck:   No stridor Cardiovascular:  RRR no M, no edema Lungs:  Coarse, R CT to suction with no airleak Abdomen:  Soft, NT, hypoactive BS Musculoskeletal:  No deformities Skin:  No rash GU - foley +   Lab 05/24/12 0450 05/21/12 1414  NA 142 139  K 3.1* 3.5  CL 108 100  CO2 29 30  GLUCOSE 148* 89  BUN 6 18  CREATININE 0.71 0.80  CALCIUM 8.7 10.2  MG -- --  PHOS -- --    Lab 05/24/12 0450 05/21/12 1414  HGB 11.1* 13.5  HCT 34.3* 40.2  WBC 8.5 6.4  PLT 187 273    Lab 05/24/12 0450 05/21/12 1414  WBC 8.5 6.4    Lab 05/21/12 1414  AST 27  ALT 23  ALKPHOS 67  BILITOT 0.6  PROT 7.6  ALBUMIN 4.5  INR 1.01    Lab 05/21/12 1448  PHART 7.429  PCO2ART 41.4  PO2ART 89.1  HCO3 26.9*  TCO2 28.2  O2SAT 97.2   Dg Chest Port 1 View  05/24/2012  *RADIOLOGY REPORT*  Clinical Data: Thoracotomy postoperative day #1  PORTABLE CHEST - 1 VIEW  Comparison: Prior chest x-ray yesterday, 05/23/2012  Findings: Right-sided thoracostomy tubes remain in unchanged position.  Right IJ central venous catheter is also in unchanged position.  The tip projects over the lower SVC.  No pneumothorax. Inspiratory volumes are low.  Streaky bibasilar opacities most consistent with atelectasis.  IMPRESSION:  1.  Similar appearance of the lungs with low inspiratory volumes and bibasilar atelectasis. 2.  Unchanged support apparatus.  No evidence of pneumothorax.   Original Report Authenticated By: Sterling Big, M.D.    Dg Chest Portable 1 View  05/23/2012  *RADIOLOGY REPORT*  Clinical Data: Postop VATS, right IJ line placement  PORTABLE CHEST - 1 VIEW  Comparison: 05/21/2012  Findings: Volume loss in the right lung.  Two right apical chest tubes.  No pneumothorax is seen.  Left lung is clear.  Interval placement of a right IJ venous catheter with its tip at the cavoatrial junction.  The heart is top normal in size.  IMPRESSION: Interval placement of a right IJ venous  catheter with its tip at the cavoatrial junction.  Two right apical chest tubes.  No pneumothorax is seen.   Original Report Authenticated By: Charline Bills, M.D.      Active Problems:  ADENOCARCINOMA, BREAST  HYPERCHOLESTEROLEMIA  PULMONARY NODULE  GASTROESOPHAGEAL REFLUX DISEASE   ASSESSMENT AND PLAN 1. RLL nodule, prelim AdenoCA, Surgical stage 1A - await final path - if adeno, would send for EGFR mutation and Alk-1 rearrangement to guide potential future therapy (may never be needed if this was 1A disease)  2. S/p RLL lobectomy - improved but having significant cough PLAN  - start incentive spiro  - start scheduled nebs post op to help with cough, will need to consider  change to mdi at dc depending on course - appreciate Dr Hendrickson's care  3. Hyperlipidemia - atorvastatin   4. GERD  - pantoprazole   BEST PRACTICE / DISPOSITION Level of Care:  ICU -> move to sDU  05/24/12 Primary Service:  TCTS Consultants:  PCCM Code Status:  Full Diet:  NPO DVT Px:  SCD GI Px:  PPI Skin Integrity:  good Social / Family:     PCCM will be on standby for weekend of 05/25/12 and 05/26/12. Will see again 05/27/12   Dr. Kalman Shan, M.D., [facility].C.P Pulmonary and Critical Care Medicine Staff Physician [facility] [place] [facility] Pulmonary and Critical Care Pager: [phone], If no answer or between  15:00h - 7:00h: call [phone]  05/24/2012 10:14 AM

## 2012-05-24 NOTE — Progress Notes (Signed)
Pt arrived from 2300, VSS, oriented to unit and routine, family at bedside.

## 2012-05-24 NOTE — Progress Notes (Signed)
Attempted to call report to [phone].  Dahlia Client, RN will call back.

## 2012-05-24 NOTE — Progress Notes (Signed)
Attempted to call report 2nd time .  

## 2012-05-24 NOTE — Op Note (Signed)
Deborah Mullins, Deborah Mullins               ACCOUNT NO.:  [id number]  MEDICAL RECORD NO.:  [id number]  LOCATION:  2314                         FACILITY:  [facility]  PHYSICIAN:  Salvatore Decent. Dorris Fetch, M.D.DATE OF BIRTH:  1950-10-05  DATE OF PROCEDURE:  05/23/2012 DATE OF DISCHARGE:                              OPERATIVE REPORT   PREOPERATIVE DIAGNOSIS:  Right lower lobe mass.  POSTOPERATIVE DIAGNOSIS:  Adenocarcinoma, clinical stage IA.  PROCEDURE:  Right video-assisted thoracoscopy, superior segmentectomy right lower lobe, right lower lobectomy, mediastinal lymph node sampling.  SURGEON:  Salvatore Decent. Dorris Fetch, M.D.  ASSISTANT:  Rowe Clack, P.A.-C.  ANESTHESIA:  General.  FINDINGS:  Approximately 2-cm mass in the medial aspect of the superior segment of the right lower lobe adenocarcinoma by frozen section with segmentectomy margin positive, therefore lobectomy performed to ensure complete resection. Relatively benign-appearing lymph nodes.  CLINICAL NOTE:  Deborah Mullins is a 61 year old woman with a remote history of tobacco abuse, who has been followed for right lower lobe lung nodule since 2009.  She was advised to have surgery, but refused to have it done in 2011.  She now has a repeat CT, which shows an increase in the size and density of the nodule.  She was once again advised to have surgical resection.  The indications, risks, benefits, and alternatives were discussed in detail with the patient.  She understood and accepted the risks and agreed to proceed.  We did plan to proceed with a superior segmentectomy.  We felt that this would be adequate resection if margins were clear and a lobectomy would only be necessary if we were unable to get an adequate margin.  OPERATIVE NOTE:  Deborah Mullins was brought to the preoperative holding area on May 23, 2012, there Anesthesia placed a central line and arterial blood pressure monitoring line.  She was taken to the operating  room, anesthetized, and intubated with a double-lumen endotracheal tube. Intravenous antibiotics were administered.  Foley catheter was placed. PAS hose were placed for DVT prophylaxis.  She was placed in a left lateral decubitus position and the right chest was prepped and draped in usual sterile fashion.  Single lung ventilation of the left lung was carried out and was tolerated well throughout the procedure.  An incision was made in the midaxillary line in the 7th intercostal space.  It was carried through the skin and subcutaneous tissue.  The chest was entered bluntly using a hemostat.  A port was inserted, the thoracoscope was placed in the chest.  There was no abnormality of the visceral or parietal pleura.  There was no pleural effusion.  A small port incision was made posteriorly below the tip of the scapula for instrumentation and then a small utility incision was made anterolaterally, this was 5 cm in length.  The serratus muscle fibers were separated, the intercostal muscles were divided.  No rib spreading was performed.  Initial inspection revealed that fissures were nearly complete. The mass was palpable in the medial aspect of the superior segment of the lower lobe.  The dissection was initiated in the major fissure and the lower lobe pulmonary arterial branches were dissected out.  There were level 11  lymph nodes that were dissected out, which aided exposure of the branches.  The superior segmental branch was identified, isolated, and divided with an endoscopic vascular stapler.  Next, the superior segmental bronchus was dissected out.  Again, nodes were taken as separate specimens.  Once the bronchus was encircled, the stapler was placed across the bronchus and closed.  Prior to firing the stapler, a test inflation was performed, which showed good aeration of the upper and middle lobes as well as the remainder of the lower lobe aside from the superior segment.  The  stapler then was fired dividing the superior segmental bronchus.  The segmentectomy was completed with sequential firings of an Endo-GIA stapler.  The mass was relatively close to the staple line, which was a concern, but any additional parenchymal resection would require stapling that might compromise the basilar segmental pulmonary arteries.  After completing segmentectomy, the specimen was placed into an endoscopic retrieval bag, removed and sent for frozen section.  While awaiting the frozen section, the inferior pulmonary ligament was divided and a level 9 node was sent for permanent section.  The pleura overlying the subcarinal nodes was incised and the level 7 subcarinal nodes were sent as a separate specimen.  Finally the pleural reflection at the hilum anteriorly was divided. Level 10 nodes and then multiple level 4R nodes were taken, the 4R nodes were sent as a single specimen.  By this point, the frozen section returned adenocarcinoma and there was tumor at or near the margin.  Because of this inability to effectively get a better margin without compromise of the basilar segmental arteries, the decision was made to proceed with a lobectomy.  The lower lobe artery was encircled and divided with an endoscopic vascular stapler.  Next, the inferior pulmonary vein was dissected out, encircled, and again divided with an endoscopic vascular stapler.  There was a small amount of major fissure between the middle and lower lobes, which was completed with an endoscopic stapler and finally the lower lobe bronchus was encircled and stapler was placed closed across the bronchus.  Test inflation showed good aeration of the upper and middle lobe without inflation of the lower lobe.  The stapler then was fired.  The specimen was placed into an endoscopic retrieval bag and the lobe was removed and sent for permanent pathology.  Inspection was made for hemostasis.  The chest was irrigated with  warm saline.  Test inflation showed no bronchial stump leak.  A 28-French Blake drain was placed through the original port incision.  A 2nd anterior port incision was made for placement of a 28- French straight chest tube.  The upper and middle lobes were reinflated. The chest tubes were secured with #1 silk sutures.  The posterior port incision was closed with #1 Vicryl suture and a 3-0 Vicryl subcuticular suture.  The anterior incision was closed with #1 Vicryl running suture followed by 2-0 Vicryl subcutaneous suture and 3-0 Vicryl subcuticular suture.  All sponge, needle, and instrument counts were correct at the end of the procedure.  The patient was taken from the operating room to the postanesthetic care unit in good condition.     Salvatore Decent Dorris Fetch, M.D.     SCH/MEDQ  D:  05/23/2012  T:  05/24/2012  Job:  [id number]

## 2012-05-24 NOTE — Progress Notes (Signed)
1 Day Post-Op Procedure(s) (LRB): VIDEO ASSISTED THORACOSCOPY (VATS)/ LOBECTOMY (Right) Subjective: Some incisional discomfort but overall feels well  Objective: Vital signs in last 24 hours: Temp:  [97.2 F (36.2 C)-98.3 F (36.8 C)] 97.6 F (36.4 C) (10/11 0415) Pulse Rate:  [88-121] 98  (10/11 0700) Cardiac Rhythm:  [-] Normal sinus rhythm (10/11 0600) Resp:  [11-25] 20  (10/11 0700) BP: (109-176)/(67-89) 115/80 mmHg (10/11 0700) SpO2:  [98 %-100 %] 100 % (10/11 0700) Arterial Line BP: (136-195)/(63-96) 163/85 mmHg (10/11 0700) Weight:  [139 lb 15.9 oz (63.5 kg)-143 lb (64.864 kg)] 143 lb (64.864 kg) (10/11 0634)  Hemodynamic parameters for last 24 hours:    Intake/Output from previous day: 10/10 0701 - 10/11 0700 In: 4512.4 [I.V.:4012.4; IV Piggyback:500] Out: 3220 [Urine:2660; Blood:50; Chest Tube:510] Intake/Output this shift:    General appearance: alert and no distress Neurologic: intact Heart: regular rate and rhythm Lungs: diminished breath sounds bibasilar Abdomen: normal findings: soft, non-tender no air leak  Lab Results:  Basename 05/24/12 0450 05/21/12 1414  WBC 8.5 6.4  HGB 11.1* 13.5  HCT 34.3* 40.2  PLT 187 273   BMET:  Basename 05/24/12 0450 05/21/12 1414  NA 142 139  K 3.1* 3.5  CL 108 100  CO2 29 30  GLUCOSE 148* 89  BUN 6 18  CREATININE 0.71 0.80  CALCIUM 8.7 10.2    PT/INR:  Basename 05/21/12 1414  LABPROT 13.2  INR 1.01   ABG    Component Value Date/Time   PHART 7.429 05/21/2012 1448   HCO3 26.9* 05/21/2012 1448   TCO2 28.2 05/21/2012 1448   O2SAT 97.2 05/21/2012 1448   CBG (last 3)  No results found for this basename: GLUCAP:3 in the last 72 hours  Assessment/Plan: S/P Procedure(s) (LRB): VIDEO ASSISTED THORACOSCOPY (VATS)/ LOBECTOMY (Right) Plan for transfer to step-down: see transfer orders POD # 1 Right VATS, right lower lobectomy No air leak- will d/c anterior CT, keep posterior tube until drainage down Pulmonary  hygiene Ambulate SCD + lovenox for DVT prophylaxis PCA, toradol(for 48 hours) for pain control To 3300 if bed available Hypokalemia- supplement    LOS: 1 day    HENDRICKSON,STEVEN C 05/24/2012

## 2012-05-24 NOTE — Progress Notes (Signed)
Utilization review completed.  

## 2012-05-25 ENCOUNTER — Inpatient Hospital Stay (HOSPITAL_COMMUNITY): Payer: 59

## 2012-05-25 LAB — COMPREHENSIVE METABOLIC PANEL
ALT: 16 U/L (ref 0–35)
AST: 27 U/L (ref 0–37)
CO2: 30 mEq/L (ref 19–32)
Chloride: 106 mEq/L (ref 96–112)
GFR calc non Af Amer: 71 mL/min — ABNORMAL LOW (ref >90)
Glucose, Bld: 106 mg/dL — ABNORMAL HIGH (ref 70–99)
Sodium: 144 mEq/L (ref 135–145)
Total Bilirubin: 0.3 mg/dL (ref 0.3–1.2)

## 2012-05-25 LAB — CBC
Hemoglobin: 11.2 g/dL — ABNORMAL LOW (ref 12.0–15.0)
MCV: 92.8 fL (ref 78.0–100.0)
Platelets: 196 10*3/uL (ref 150–400)
RBC: 3.76 MIL/uL — ABNORMAL LOW (ref 3.87–5.11)
WBC: 7.6 10*3/uL (ref 4.0–10.5)

## 2012-05-25 MED ORDER — DIPHENHYDRAMINE HCL 25 MG PO CAPS
25.0000 mg | ORAL_CAPSULE | Freq: Two times a day (BID) | ORAL | Status: DC | PRN
  Filled 2012-05-25: qty 1

## 2012-05-25 MED ORDER — FLEET ENEMA 7-19 GM/118ML RE ENEM: 1.0000 | ENEMA | Freq: Once | RECTAL | Status: DC

## 2012-05-25 MED ORDER — ALUM & MAG HYDROXIDE-SIMETH 200-200-20 MG/5ML PO SUSP
30.0000 mL | ORAL | Status: DC | PRN
  Administered 2012-05-25: 30 mL via ORAL
  Filled 2012-05-25: qty 30

## 2012-05-25 MED ORDER — POLYETHYLENE GLYCOL 3350 17 G PO PACK
17.0000 g | PACK | Freq: Every day | ORAL | Status: DC
  Administered 2012-05-25 – 2012-05-26 (×2): 17 g via ORAL
  Filled 2012-05-25 (×2): qty 1

## 2012-05-25 NOTE — Progress Notes (Signed)
Right sided CT d/c'd per MD order per protocol. Patient tolerated well. Will contiune to monitor.

## 2012-05-25 NOTE — Progress Notes (Addendum)
2 Days Post-Op Procedure(s) (LRB): VIDEO ASSISTED THORACOSCOPY (VATS)/ LOBECTOMY (Right) Subjective:  Ms. Kirchner complains of not getting enough to eat.  She also complains of no bowel movement and that her central ine be removed.   Objective: Vital signs in last 24 hours: Temp:  [97.8 F (36.6 C)-98.6 F (37 C)] 98.1 F (36.7 C) (10/12 0736) Pulse Rate:  [74-113] 102  (10/12 0725) Cardiac Rhythm:  [-] Normal sinus rhythm (10/11 2030) Resp:  [13-27] 20  (10/12 0725) BP: (109-141)/(64-87) 141/87 mmHg (10/12 0725) SpO2:  [59 %-100 %] 96 % (10/12 0725)  Intake/Output from previous day: 10/11 0701 - 10/12 0700 In: 529 [P.O.:60; I.V.:219; IV Piggyback:250] Out: 975 [Urine:825; Chest Tube:150] Intake/Output this shift: Total I/O In: 380 [P.O.:360; I.V.:20] Out: 90 [Chest Tube:90]  General appearance: alert, cooperative and no distress Heart: regular rate and rhythm Lungs: clear to auscultation bilaterally Abdomen: soft, non-tender; bowel sounds normal; no masses,  no organomegaly Wound: clean and dry  Lab Results:  Basename 05/25/12 0500 05/24/12 0450  WBC 7.6 8.5  HGB 11.2* 11.1*  HCT 34.9* 34.3*  PLT 196 187   BMET:  Basename 05/25/12 0500 05/24/12 0450  NA 144 142  K 3.6 3.1*  CL 106 108  CO2 30 29  GLUCOSE 106* 148*  BUN 10 6  CREATININE 0.87 0.71  CALCIUM 9.4 8.7    PT/INR: No results found for this basename: LABPROT,INR in the last 72 hours ABG    Component Value Date/Time   PHART 7.429 05/21/2012 1448   HCO3 26.9* 05/21/2012 1448   TCO2 28.2 05/21/2012 1448   O2SAT 97.2 05/21/2012 1448   CBG (last 3)  No results found for this basename: GLUCAP:3 in the last 72 hours  Assessment/Plan: S/P Procedure(s) (LRB): VIDEO ASSISTED THORACOSCOPY (VATS)/ LOBECTOMY (Right)  1. Chest tube in place- no air leak present, 150cc output yesterday will plan to d/c chest tube 2. D/C Central Line 3. LOC Constipation- will order Miralax per patient request 4. Diet-  patient on regular diet, will place order for no restrictions on amount 5. Dispo- patient doing well, will d/c chest tube repeat CXR in AM, likely d/c home tomorrow   LOS: 2 days    BARRETT, ERIN 05/25/2012   I have seen and examined the patient and agree with the assessment and plan as outlined.  Jarrette Dehner H 05/25/2012 12:34 PM

## 2012-05-25 NOTE — Progress Notes (Signed)
Patient's oxygen sats 87-89% on room air. Patient dyspneic with exertion while getting up to bedside commode. No obvious signs of acute distress noted. Oxygen placed at 2L/[place]. Sats increased 95-99%.

## 2012-05-25 NOTE — Progress Notes (Signed)
Fentanyl PCA 24 ml wasted in sink. Witnessed by Raymon Mutton RN. Renette Butters, Viona Gilmore

## 2012-05-26 ENCOUNTER — Inpatient Hospital Stay (HOSPITAL_COMMUNITY): Payer: 59

## 2012-05-26 MED ORDER — OXYCODONE-ACETAMINOPHEN 5-325 MG PO TABS: 1.0000 | ORAL_TABLET | ORAL | Status: DC | PRN

## 2012-05-26 MED ORDER — POLYETHYLENE GLYCOL 3350 17 GM/SCOOP PO POWD: 17.0000 g | Freq: Every day | ORAL | Status: DC

## 2012-05-26 MED ORDER — METOPROLOL TARTRATE 25 MG PO TABS: 12.5000 mg | ORAL_TABLET | Freq: Two times a day (BID) | ORAL | Status: DC

## 2012-05-26 NOTE — Discharge Summary (Signed)
Physician Discharge Summary  Patient ID: Deborah Mullins MRN: [id number] DOB/AGE: 04-06-1951 61 y.o.  Admit date: 05/23/2012 Discharge date: 05/26/2012  Admission Diagnoses:  Patient Active Problem List  Diagnosis  . ADENOCARCINOMA, BREAST  . FIBROIDS, UTERUS  . HYPERCHOLESTEROLEMIA  . HYPERLIPIDEMIA  . DEPRESSION  . HEMORRHOIDS  . PULMONARY NODULE  . GASTROESOPHAGEAL REFLUX DISEASE  . GASTRITIS  . CONSTIPATION  . RECTAL BLEEDING  . FLATULENCE ERUCTATION AND GAS PAIN  . ABDOMINAL PAIN  . RECTAL BLEEDING, HX OF   Discharge Diagnoses:   Patient Active Problem List  Diagnosis  . ADENOCARCINOMA, BREAST  . FIBROIDS, UTERUS  . HYPERCHOLESTEROLEMIA  . HYPERLIPIDEMIA  . DEPRESSION  . HEMORRHOIDS  . PULMONARY NODULE  . GASTROESOPHAGEAL REFLUX DISEASE  . GASTRITIS  . CONSTIPATION  . RECTAL BLEEDING  . FLATULENCE ERUCTATION AND GAS PAIN  . ABDOMINAL PAIN  . RECTAL BLEEDING, HX OF   Discharged Condition: good  History of Present Illness:   Deborah Mullins is a 61 yo female with history of tobacco abuse and breast cancer who was found to have a pulmonary nodule in her right lower lobe by CT Scan in 2009.  She underwent a PET CT scan which did not show evidence of hypermetabolic uptake at that time.  She was originally evaluated for surgery in 2011 at which time she did not want to have it done.  She has been routinely followed by Dr. Delton Coombes and her latest CT scan showed an increase in size and density of the nodule.  It was felt she should undergo excisional biopsy and she was referred to Dr. Dorris Fetch for evaluation.  She was evaluated on 05/14/2012 at which time the patient the patient states she has dyspnea with only heavy exertion.  She denied hemoptysis and weight loss but did complain of an occasional non-productive cough.  At that time Dr. Dorris Fetch felt that the patient would best be served with surgical resection.  The risks and benefits of the procedure were explained to the  patient and she was agreeable to proceed.  Surgery was scheduled for May 23, 2012.  Hospital Course:   On May 23, 2012 the patient presented to [facility].  She was taken to the operating room and underwent a Right VATS with superior segmentectomy of the right lower lobe, right lower lobectomy and mediastinal lymph node sampling.  The frozen pathology obtained confirmed the presence of Adenocarcinoma with evidence of tumor at the margins.  Therefore, lobectomy was completed.  The patient tolerated the procedure well, was extubated and taken to the PACU in stable condition.  POD #1 the patient was doing well.  There was no evidence of air leak with her chest tubes and her anterior chest tube was removed without difficulty.  The remaining tubes were placed to water seal.  She was medically stable and transferred to the step down unit.  POD #2 the patient's central line was removed without difficulty.  Her chest tubes again did now have an air leak present.  There was minimal drainage present and her remaining chest tube was removed.  POD #3 the patients chest xray shows a minimal right pneumothorax.  The patient is stable without complaints.  The patients blood pressure and heart rate are elevated.  She has a history of HTN for which she takes HCTZ for and she will be started on low dose Lopressor.  Final pathology is pending.  She will be discharged home today.  She will need to  follow up in our office in one weeks time for suture removal.  She will also need to follow up with Dr. Dorris Fetch in 2 weeks with a chest xray.  Our office will contact the patient with the necessary appointments.  She will also need to schedule a follow up appointment with Dr. Delton Coombes and her PCP.   Treatments: surgery:   Right video-assisted thoracoscopy, superior segmentectomy  right lower lobe, right lower lobectomy, mediastinal lymph node  sampling.   Disposition: 01-Home or Self Care     Medication List       As of 05/26/2012 11:01 AM    STOP taking these medications         HYDROcodone-acetaminophen 5-325 MG per tablet   Commonly known as: NORCO/VICODIN      TAKE these medications         amitriptyline 25 MG tablet   Commonly known as: ELAVIL   Take 25 mg by mouth at bedtime.      dexlansoprazole 60 MG capsule   Commonly known as: DEXILANT   Take 60 mg by mouth daily.      hydrochlorothiazide 25 MG tablet   Commonly known as: HYDRODIURIL   Take 25 mg by mouth daily.      metoprolol tartrate 25 MG tablet   Commonly known as: LOPRESSOR   Take 0.5 tablets (12.5 mg total) by mouth 2 (two) times daily.      oxyCODONE-acetaminophen 5-325 MG per tablet   Commonly known as: PERCOCET/ROXICET   Take 1-2 tablets by mouth every 4 (four) hours as needed.      polyethylene glycol powder powder   Commonly known as: GLYCOLAX/MIRALAX   Take 17 g by mouth daily.      simvastatin 80 MG tablet   Commonly known as: ZOCOR   Take 80 mg by mouth at bedtime.           Follow-up Information    Follow up with HENDRICKSON,STEVEN C, MD. In 2 weeks. (Office will contact you with appointment)    Contact information:   [address] [employer] [address] [place] [address] [phone]       Follow up with [facility]. (Please get chest xray one hour prior to your appointment with Dr. Dorris Fetch)    Contact information:   [address] [place] [address]       Follow up with Leslye Peer., MD. In 2 weeks. (Please contact office to set up appointment)    Contact information:   [address] [facility] [place] [address] [phone]       Follow up with [facility]. In 1 week. (Office will contact you to set up suture removal)       Follow up with Thayer Headings, MD. In 2 weeks. (Please make follow up appointment for HR and blood pressure check)    Contact information:   [address] [place]  [address] [phone]          Signed: Lowella Dandy 05/26/2012, 11:01 AM

## 2012-05-26 NOTE — Progress Notes (Addendum)
3 Days Post-Op Procedure(s) (LRB): VIDEO ASSISTED THORACOSCOPY (VATS)/ LOBECTOMY (Right) Subjective:  Deborah Mullins has no complaints this morning.  She states she feels good and would like to be discharged home today  Objective: Vital signs in last 24 hours: Temp:  [97.6 F (36.4 C)-98.7 F (37.1 C)] 98.1 F (36.7 C) (10/13 0358) Pulse Rate:  [94-118] 115  (10/13 0815) Cardiac Rhythm:  [-] Sinus tachycardia (10/13 0815) Resp:  [12-29] 29  (10/13 0815) BP: (108-132)/(62-90) 132/90 mmHg (10/13 0815) SpO2:  [92 %-99 %] 98 % (10/13 0915)  Intake/Output from previous day: 10/12 0701 - 10/13 0700 In: 680 [P.O.:600; I.V.:80] Out: 140 [Chest Tube:140] Intake/Output this shift: Total I/O In: 240 [P.O.:240] Out: -   General appearance: alert, cooperative and no distress Neurologic: intact Heart: regular rate and rhythm Lungs: clear to auscultation bilaterally Abdomen: soft, non-tender; bowel sounds normal; no masses,  no organomegaly Wound: clean and dry  Lab Results:  Basename 05/25/12 0500 05/24/12 0450  WBC 7.6 8.5  HGB 11.2* 11.1*  HCT 34.9* 34.3*  PLT 196 187   BMET:  Basename 05/25/12 0500 05/24/12 0450  NA 144 142  K 3.6 3.1*  CL 106 108  CO2 30 29  GLUCOSE 106* 148*  BUN 10 6  CREATININE 0.87 0.71  CALCIUM 9.4 8.7    PT/INR: No results found for this basename: LABPROT,INR in the last 72 hours ABG    Component Value Date/Time   PHART 7.429 05/21/2012 1448   HCO3 26.9* 05/21/2012 1448   TCO2 28.2 05/21/2012 1448   O2SAT 97.2 05/21/2012 1448   CBG (last 3)  No results found for this basename: GLUCAP:3 in the last 72 hours  Assessment/Plan: S/P Procedure(s) (LRB): VIDEO ASSISTED THORACOSCOPY (VATS)/ LOBECTOMY (Right)  1. CV- NSR Tachy, HTN- patient on HCTZ as outpatient will add low dose Lopressor 12.5mg  BID and have patient follow up with PCP 2. Pulm- no acute issues, continue IS at discharge, CXR shows minimal pneumothorax and no significant pleural  effusion 3. LOC Constipation- resolved 4. Dispo- patient doing well, final pathology results pending, will plan for d/c home today    LOS: 3 days    BARRETT, ERIN 05/26/2012   I have seen and examined the patient and agree with the assessment and plan as outlined.  OWEN,CLARENCE H 05/26/2012 11:30 AM

## 2012-05-26 NOTE — Progress Notes (Signed)
05/25/2012 2000, notified Dr Cornelius Moras Patients hr in 120s, no orders received, will continue to monitor. Dorie Rank

## 2012-05-26 NOTE — Progress Notes (Signed)
Discharge instructions given to pt--pt verbalized understanding of follow-up appts, activity, wound care, home meds, s/s of complications and when to call MD/EMS. Renette Butters, Viona Gilmore

## 2012-05-30 ENCOUNTER — Other Ambulatory Visit: Payer: Self-pay | Admitting: *Deleted

## 2012-05-30 DIAGNOSIS — G8918 Other acute postprocedural pain: Secondary | ICD-10-CM

## 2012-05-30 MED ORDER — HYDROCODONE-ACETAMINOPHEN 7.5-500 MG PO TABS: 1.0000 | ORAL_TABLET | Freq: Four times a day (QID) | ORAL | Status: DC | PRN

## 2012-06-06 ENCOUNTER — Other Ambulatory Visit: Payer: Self-pay | Admitting: Thoracic Surgery (Cardiothoracic Vascular Surgery)

## 2012-06-06 ENCOUNTER — Telehealth: Payer: Self-pay | Admitting: Emergency Medicine

## 2012-06-06 DIAGNOSIS — R911 Solitary pulmonary nodule: Secondary | ICD-10-CM

## 2012-06-06 NOTE — Telephone Encounter (Signed)
Spoke with pt. She is c/o increased SOB with or without exertion for the past few days. She also has noticed some wheezing and "lung pain". OV scheduled with PW for tomorrow at 3:45 pm and I advised her to seek emergent care sooner should her symptoms persist or worsen. Pt verbalized understanding.

## 2012-06-07 ENCOUNTER — Ambulatory Visit (INDEPENDENT_AMBULATORY_CARE_PROVIDER_SITE_OTHER): Payer: Managed Care, Other (non HMO) | Admitting: Critical Care Medicine

## 2012-06-07 ENCOUNTER — Ambulatory Visit (INDEPENDENT_AMBULATORY_CARE_PROVIDER_SITE_OTHER)
Admission: RE | Admit: 2012-06-07 | Discharge: 2012-06-07 | Disposition: A | Discharge status: Home / Self Care | Payer: Managed Care, Other (non HMO) | Source: Ambulatory Visit | Attending: Critical Care Medicine | Admitting: Critical Care Medicine

## 2012-06-07 ENCOUNTER — Encounter: Payer: Self-pay | Admitting: Critical Care Medicine

## 2012-06-07 VITALS — BP 122/84 | HR 111 | Temp 97.8°F | Ht 63.0 in | Wt 139.8 lb

## 2012-06-07 DIAGNOSIS — R05 Cough: Secondary | ICD-10-CM

## 2012-06-07 DIAGNOSIS — C3490 Malignant neoplasm of unspecified part of unspecified bronchus or lung: Secondary | ICD-10-CM

## 2012-06-07 DIAGNOSIS — R059 Cough, unspecified: Secondary | ICD-10-CM

## 2012-06-07 DIAGNOSIS — J939 Pneumothorax, unspecified: Secondary | ICD-10-CM

## 2012-06-07 DIAGNOSIS — J209 Acute bronchitis, unspecified: Secondary | ICD-10-CM

## 2012-06-07 DIAGNOSIS — J9383 Other pneumothorax: Secondary | ICD-10-CM

## 2012-06-07 MED ORDER — MUCINEX DM 30-600 MG PO TB12: ORAL_TABLET | ORAL | Status: DC

## 2012-06-07 MED ORDER — HYDROCODONE-HOMATROPINE 5-1.5 MG/5ML PO SYRP: 5.0000 mL | ORAL_SOLUTION | Freq: Four times a day (QID) | ORAL | Status: DC | PRN

## 2012-06-07 MED ORDER — ALBUTEROL SULFATE HFA 108 (90 BASE) MCG/ACT IN AERS: 2.0000 | INHALATION_SPRAY | Freq: Four times a day (QID) | RESPIRATORY_TRACT | Status: DC | PRN

## 2012-06-07 MED ORDER — PREDNISONE 10 MG PO TABS: ORAL_TABLET | ORAL | Status: DC

## 2012-06-07 MED ORDER — AZITHROMYCIN 250 MG PO TABS: 250.0000 mg | ORAL_TABLET | Freq: Every day | ORAL | Status: DC

## 2012-06-07 NOTE — Patient Instructions (Addendum)
Azithromycin 250mg  Take two once then one daily until gone Prednisone 10mg  Take 4 for two days three for two days two for two days one for two days Mucinex DM for cough 1-2 twice daily as needed Albuterol 1-2 puff every 4 hours as needed for wheezing/cough Return to see Dr Delton Coombes for recheck in 3 weeks

## 2012-06-07 NOTE — Progress Notes (Signed)
Subjective:    Patient ID: Deborah Mullins, female    DOB: 01-21-1951, 61 y.o.   MRN: [id number]  HPI  61 yo woman, former smoker (10 pk-yrs) , hx of breast CA s/p surgery 2005 + XRT, HTN, allergic rhinitis. She is referred by Dr Wilford Grist to discuss her hx of pulmonary nodule. This was first discovered by CT scan in . Last CT/PET was done 01/2010 >> shows 0.8 x 1.5cm RLL perivascular nodule with ground glass and more solid components.  She has been seen by both Dr Sherene Sires and Dr Edwyna Shell regarding the lesion, with concern that it needed to be resected due to suspicion for BAC. She had 2nd opinion in [name], PET done 01/2010 was not hypermetabolic and so surgery was cancelled.  She now needs to be reevaluated for whether she needs further imaging.   10/25 Pt followed for lung nodule by RB At last ov 9/13: RLL perivascular nodule with GG component. I reviewed her CT scans and PET scans from 2009 to 2011, including the PET from [name]. Even though it had been stable in size and was PET negative in 2011, I would still worry about a possible BAC. She needs a repeat scan now - if stable in appearance then we can sort out whether she needs any surveillance. If it has changed in size or density, then I think we should refer to thoracic surgery. She was referred in similar fashion before, was then seen in CT. They decided to defer sgy at that time.  Pt had surgery RLL resected.  05/23/12 Pos for CA.  Pt now has more cough , difficulty breathing, wheezing for 5 days.  IS only 1/2 up. Cough is dry.  Pt did cough up some clear phlegm  Notes more wheeze.   No inhalers.  No pndrip.   Pain is noted on Rside at incision site.   Review of Systems  Constitutional: Positive for unexpected weight change. Negative for fever, chills, diaphoresis, activity change, appetite change and fatigue.  HENT: Positive for ear pain and sneezing. Negative for hearing loss, nosebleeds, congestion, sore throat, facial swelling,  rhinorrhea, mouth sores, trouble swallowing, neck pain, neck stiffness, dental problem, voice change, postnasal drip, sinus pressure, tinnitus and ear discharge.   Eyes: Negative for photophobia, discharge, itching and visual disturbance.  Respiratory: Positive for shortness of breath. Negative for apnea, cough, choking, chest tightness, wheezing and stridor.   Cardiovascular: Positive for chest pain. Negative for palpitations and leg swelling.  Gastrointestinal: Negative for nausea, vomiting, abdominal pain, constipation, blood in stool and abdominal distention.  Genitourinary: Negative for dysuria, urgency, frequency, hematuria, flank pain, decreased urine volume and difficulty urinating.  Musculoskeletal: Positive for arthralgias. Negative for myalgias, back pain, joint swelling and gait problem.  Skin: Negative for color change, pallor and rash.  Neurological: Positive for headaches. Negative for dizziness, tremors, seizures, syncope, speech difficulty, weakness, light-headedness and numbness.  Hematological: Negative for adenopathy. Does not bruise/bleed easily.  Psychiatric/Behavioral: Negative for confusion, disturbed wake/sleep cycle and agitation. The patient is nervous/anxious.    Past Medical History  Diagnosis Date  . Hypertension   . Cancer     breast   . Hyperlipidemia   . GERD (gastroesophageal reflux disease)   . Anxiety   . Allergic rhinitis   . Lung nodule 2009  . PONV (postoperative nausea and vomiting)   . Shortness of breath     exertion  . Headache   . Arthritis      Family History  Problem Relation Age of Onset  . Heart disease Mother     CHF  . Cancer Sister      History   Social History  . Marital Status: Legally Separated    Spouse Name: N/A    Number of Children: 1  . Years of Education: N/A   Occupational History  . retired     Science writer   Social History Main Topics  . Smoking status: Former Smoker -- 1.0 packs/day for 10 years      Types: Cigarettes    Quit date: 08/14/1974  . Smokeless tobacco: Never Used  . Alcohol Use: No  . Drug Use: No  . Sexually Active: Not on file   Other Topics Concern  . Not on file   Social History Narrative  . No narrative on file     Allergies  Allergen Reactions  . Bee Venom Anaphylaxis  . Penicillins Rash     Outpatient Prescriptions Prior to Visit  Medication Sig Dispense Refill  . amitriptyline (ELAVIL) 25 MG tablet Take 25 mg by mouth at bedtime.      Marland Kitchen dexlansoprazole (DEXILANT) 60 MG capsule Take 60 mg by mouth daily.      . hydrochlorothiazide (HYDRODIURIL) 25 MG tablet Take 25 mg by mouth daily.      Marland Kitchen HYDROcodone-acetaminophen (LORTAB 7.5) 7.5-500 MG per tablet Take 1 tablet by mouth every 6 (six) hours as needed for pain (may take one tab every 4-6 hrs prn pain).  40 tablet  0  . metoprolol tartrate (LOPRESSOR) 25 MG tablet Take 0.5 tablets (12.5 mg total) by mouth 2 (two) times daily.  30 tablet  1  . simvastatin (ZOCOR) 80 MG tablet Take 80 mg by mouth at bedtime.      . polyethylene glycol powder (DULCOLAX BALANCE) powder Take 17 g by mouth daily.  255 g  0  . oxyCODONE-acetaminophen (PERCOCET/ROXICET) 5-325 MG per tablet Take 1-2 tablets by mouth every 4 (four) hours as needed.  30 tablet  0       Objective:   Physical Exam  Filed Vitals:   06/07/12 1619  BP: 122/84  Pulse: 111  Temp: 97.8 F (36.6 C)   Gen: Pleasant, well-nourished, in no distress,  normal affect  ENT: No lesions,  mouth clear,  oropharynx clear, no postnasal drip  Neck: No JVD, no TMG, no carotid bruits  Lungs: No use of accessory muscles, no dullness to percussion, expired wheezes.  Sutures in incisions on R side of chest   Cardiovascular: RRR, heart sounds normal, no murmur or gallops, no peripheral edema  Musculoskeletal: No deformities, no cyanosis or clubbing  Neuro: alert, non focal  Skin: Warm, no lesions or rashes  Dg Chest 2 View  06/07/2012  *RADIOLOGY  REPORT*  Clinical Data: Cough.  Status post VATs  CHEST - 2 VIEW  Comparison: 05/26/2012  Findings: Normal heart size.  No pleural effusion or edema.  Postop change in volume loss is noted involving the right lung.  The resolution of the previously noted right apical pneumothorax.  No airspace consolidation identified.  IMPRESSION:  1.  No evidence for pneumonia. 2.  Resolution of right apical pneumothorax.   Original Report Authenticated By: Rosealee Albee, M.D.        Assessment & Plan:  Acute bronchitis Acute tracheobronchitis with flare and bronchospasm. No pneumothorax on CXR seen or PNA Plan Azithromycin 250mg  Take two once then one daily until gone Prednisone 10mg  Take 4 for  two days three for two days two for two days one for two days Mucinex DM for cough 1-2 twice daily as needed Albuterol 1-2 puff every 4 hours as needed for wheezing/cough Return to see Dr Delton Coombes for recheck in 3 weeks   Adenocarcinoma of lung, stage 1 Adenocarcinoma of lung.  RLL   Stage I S/P VATS RLL resection 05/2012 [place], negative margins. No XRT or ChemoRx advised  Excellent postop outcomes Pt given copy of her path report and I reviewed this with pt and husband. Note CXR taken today for this visit so no CXR needed in 4 days for Dr Dorris Fetch visit

## 2012-06-08 DIAGNOSIS — J209 Acute bronchitis, unspecified: Secondary | ICD-10-CM | POA: Insufficient documentation

## 2012-06-08 NOTE — Assessment & Plan Note (Signed)
Acute tracheobronchitis with flare and bronchospasm. No pneumothorax on CXR seen or PNA Plan Azithromycin 250mg  Take two once then one daily until gone Prednisone 10mg  Take 4 for two days three for two days two for two days one for two days Mucinex DM for cough 1-2 twice daily as needed Albuterol 1-2 puff every 4 hours as needed for wheezing/cough Return to see Dr Delton Coombes for recheck in 3 weeks

## 2012-06-08 NOTE — Assessment & Plan Note (Signed)
Adenocarcinoma of lung.  RLL   Stage I S/P VATS RLL resection 05/2012 [place], negative margins. No XRT or ChemoRx advised  Excellent postop outcomes Pt given copy of her path report and I reviewed this with pt and husband. Note CXR taken today for this visit so no CXR needed in 4 days for Dr Dorris Fetch visit

## 2012-06-11 ENCOUNTER — Ambulatory Visit (INDEPENDENT_AMBULATORY_CARE_PROVIDER_SITE_OTHER): Payer: Self-pay | Admitting: Thoracic Surgery (Cardiothoracic Vascular Surgery)

## 2012-06-11 ENCOUNTER — Other Ambulatory Visit: Payer: Self-pay | Admitting: *Deleted

## 2012-06-11 VITALS — BP 140/91 | HR 102 | Resp 16 | Ht 63.0 in | Wt 136.0 lb

## 2012-06-11 DIAGNOSIS — G8918 Other acute postprocedural pain: Secondary | ICD-10-CM

## 2012-06-11 DIAGNOSIS — Z09 Encounter for follow-up examination after completed treatment for conditions other than malignant neoplasm: Secondary | ICD-10-CM

## 2012-06-11 DIAGNOSIS — C3430 Malignant neoplasm of lower lobe, unspecified bronchus or lung: Secondary | ICD-10-CM

## 2012-06-11 MED ORDER — HYDROCODONE-ACETAMINOPHEN 7.5-500 MG PO TABS: 1.0000 | ORAL_TABLET | Freq: Four times a day (QID) | ORAL | Status: DC | PRN

## 2012-06-11 NOTE — Progress Notes (Signed)
HPI:  [name] returns today for a scheduled followup visit. She had a thoracoscopic right lower lobectomy for stage IA adenocarcinoma on 05/23/2012. Postoperative course was uncomplicated. She developed a cough and saw Dr. Delford Field earlier this week. He diagnosed with bronchitis and treated her with a Z-Pak and prednisone. She has seen some improvement but still has a frequent cough congestion. She complains of incisional discomfort. This is primarily anterior to the chest tube sites. It is worse with coughing deep breathing. She's been taken hydrocodone one tablet 2-3 times a day for that. She is frustrated that she's not back to full activities.  Past Medical History  Diagnosis Date  . Hypertension   . Cancer     breast   . Hyperlipidemia   . GERD (gastroesophageal reflux disease)   . Anxiety   . Allergic rhinitis   . Lung nodule 2009  . PONV (postoperative nausea and vomiting)   . Shortness of breath     exertion  . Headache   . Arthritis    stage IA adenocarcinoma (BAC) status post right lower lobectomy   Current Outpatient Prescriptions  Medication Sig Dispense Refill  . albuterol (PROVENTIL HFA) 108 (90 BASE) MCG/ACT inhaler Inhale 2 puffs into the lungs every 6 (six) hours as needed for wheezing.  1 Inhaler  1  . amitriptyline (ELAVIL) 25 MG tablet Take 25 mg by mouth at bedtime.      Marland Kitchen azithromycin (ZITHROMAX) 250 MG tablet Take 1 tablet (250 mg total) by mouth daily.  6 tablet  0  . dexlansoprazole (DEXILANT) 60 MG capsule Take 60 mg by mouth daily.      Marland Kitchen Dextromethorphan-Guaifenesin (MUCINEX DM) 30-600 MG TB12 1-2 twice daily for cough  28 each  0  . hydrochlorothiazide (HYDRODIURIL) 25 MG tablet Take 25 mg by mouth daily.      Marland Kitchen HYDROcodone-acetaminophen (LORTAB 7.5) 7.5-500 MG per tablet Take 1 tablet by mouth every 6 (six) hours as needed for pain (may take one tab every 4-6 hrs prn pain).  40 tablet  0  . metoprolol tartrate (LOPRESSOR) 25 MG tablet Take 0.5 tablets  (12.5 mg total) by mouth 2 (two) times daily.  30 tablet  1  . polyethylene glycol powder (GLYCOLAX/MIRALAX) powder Take 17 g by mouth daily as needed.      . predniSONE (DELTASONE) 10 MG tablet Take 4 for two days three for two days two for two days one for two days  20 tablet  0  . simvastatin (ZOCOR) 80 MG tablet Take 80 mg by mouth at bedtime.      Marland Kitchen oxyCODONE-acetaminophen (PERCOCET/ROXICET) 5-325 MG per tablet         Physical Exam BP 140/91  Pulse 102  Resp 16  Ht 5\' 3"  (1.6 m)  Wt 136 lb (61.689 kg)  BMI 24.09 kg/m2  SpO34 27% 61 year old woman in no acute distress Lungs with bronchial breath sounds and faint wheezes right base Incisions are clean dry and intact  Diagnostic Tests: Chest x-ray from 06/07/2012 shows postoperative changes with improving aeration right lung  Impression: 61 year old woman now about 2-1/2 weeks out from a thoracoscopic right lower lobectomy. She has had bronchitis and is being treated appropriately for that by Dr. Delford Field. Outside of that I think she's doing extremely well. I think she may be a little unrealistic in terms of her return to full activities. At this point she really is unrestricted her activities but should avoid those activities which exacerbate her pain.  I did discuss with her that she had a potentially curative resection we do need to continue to follow her. I will see her back in 3 months (roughly 4 months from surgery) with a CT of the chest. We'll plan to see her back on an every 4 month basis for 2 years and then every 6 months out to 5 years.  Plan: Return in 3 months with CT chest

## 2012-06-20 ENCOUNTER — Other Ambulatory Visit: Payer: Self-pay | Admitting: Thoracic Surgery (Cardiothoracic Vascular Surgery)

## 2012-06-20 DIAGNOSIS — G8918 Other acute postprocedural pain: Secondary | ICD-10-CM

## 2012-07-01 ENCOUNTER — Encounter: Payer: Self-pay | Admitting: Emergency Medicine

## 2012-07-01 ENCOUNTER — Ambulatory Visit (INDEPENDENT_AMBULATORY_CARE_PROVIDER_SITE_OTHER): Payer: Managed Care, Other (non HMO) | Admitting: Emergency Medicine

## 2012-07-01 VITALS — BP 118/70 | HR 114 | Temp 97.7°F | Ht 64.0 in | Wt 138.0 lb

## 2012-07-01 DIAGNOSIS — F3289 Other specified depressive episodes: Secondary | ICD-10-CM

## 2012-07-01 DIAGNOSIS — C3490 Malignant neoplasm of unspecified part of unspecified bronchus or lung: Secondary | ICD-10-CM

## 2012-07-01 DIAGNOSIS — J4489 Other specified chronic obstructive pulmonary disease: Secondary | ICD-10-CM

## 2012-07-01 DIAGNOSIS — F329 Major depressive disorder, single episode, unspecified: Secondary | ICD-10-CM

## 2012-07-01 DIAGNOSIS — J449 Chronic obstructive pulmonary disease, unspecified: Secondary | ICD-10-CM

## 2012-07-01 NOTE — Assessment & Plan Note (Signed)
Stable post-op. No chemo or XRT needed.  - follow up CXR/CT scans with Dr Dorris Fetch.

## 2012-07-01 NOTE — Assessment & Plan Note (Signed)
Tearful today. She is clearly depressed, but states that she wants help. Has already started process to get into behavioral health, but no call from them yet with an appointment.  - will attempt to confirm the [facility] appointment

## 2012-07-01 NOTE — Patient Instructions (Addendum)
Please continue to use your albuterol as needed for shortness of breath We will try to confirm your appointment with Behavioral Health Follow with Dr Delton Coombes in 6 months or sooner if you have any problems

## 2012-07-01 NOTE — Progress Notes (Signed)
  Subjective:    Patient ID: Deborah Mullins, female    DOB: 08/13/1951, 61 y.o.   MRN: [id number] HPI 61 yo woman, former smoker (10 pk-yrs) , hx of breast CA s/p surgery 2005 + XRT, HTN, allergic rhinitis. She is referred by Dr Wilford Grist to discuss her hx of pulmonary nodule. This was first discovered by CT scan in . Last CT/PET was done 01/2010 >> shows 0.8 x 1.5cm RLL perivascular nodule with ground glass and more solid components.  She has been seen by both Dr Sherene Sires and Dr Edwyna Shell regarding the lesion, with concern that it needed to be resected due to suspicion for BAC. She had 2nd opinion in [name], PET done 01/2010 was not hypermetabolic and so surgery was cancelled.  She now needs to be reevaluated for whether she needs further imaging.   10/25 Pt followed for lung nodule by RB At last ov 9/13: RLL perivascular nodule with GG component. I reviewed her CT scans and PET scans from 2009 to 2011, including the PET from [name]. Even though it had been stable in size and was PET negative in 2011, I would still worry about a possible BAC. She needs a repeat scan now - if stable in appearance then we can sort out whether she needs any surveillance. If it has changed in size or density, then I think we should refer to thoracic surgery. She was referred in similar fashion before, was then seen in CT. They decided to defer sgy at that time.  06/07/12 OV w Dr Delford Field: Pt had surgery RLL resected.  05/23/12 Pos for CA. Pt now has more cough , difficulty breathing, wheezing for 5 days.  IS only 1/2 up. Cough is dry.  Pt did cough up some clear phlegm  Notes more wheeze.   No inhalers.  No pndrip.   Pain is noted on Rside at incision site.  ROV 07/01/12 -- Former tobacco, hx RLL GG nodule, s/p resection by Dr Dorris Fetch >> adeno CA/BAC. Seen last month by Dr Delford Field for AE-COPD w bronchitis. Returns today for f/u. She is depressed, is planning to start seeing a psychiatrist. Was started on amitriptyline  by Dr Thea Silversmith. This is her biggest problem right now. Has not started smoking again. She uses albuterol prn - almost every day.      Objective:   Physical Exam  Filed Vitals:   07/01/12 1429  BP: 118/70  Pulse: 114  Temp: 97.7 F (36.5 C)   Gen: Pleasant, well-nourished, in no distress,  normal affect  ENT: No lesions,  mouth clear,  oropharynx clear, no postnasal drip  Neck: No JVD, no TMG, no carotid bruits  Lungs: No use of accessory muscles, no dullness to percussion, expired wheezes.  Sutures in incisions on R side of chest   Cardiovascular: RRR, heart sounds normal, no murmur or gallops, no peripheral edema  Musculoskeletal: No deformities, no cyanosis or clubbing  Neuro: alert, non focal  Skin: Warm, no lesions or rashes      Assessment & Plan:  Adenocarcinoma of lung, stage 1 Stable post-op. No chemo or XRT needed.  - follow up CXR/CT scans with Dr Dorris Fetch.   DEPRESSION Tearful today. She is clearly depressed, but states that she wants help. Has already started process to get into behavioral health, but no call from them yet with an appointment.  - will attempt to confirm the [facility] appointment

## 2012-07-11 ENCOUNTER — Other Ambulatory Visit: Payer: Self-pay | Admitting: Thoracic Surgery (Cardiothoracic Vascular Surgery)

## 2012-07-12 ENCOUNTER — Other Ambulatory Visit: Payer: Self-pay | Admitting: Thoracic Surgery (Cardiothoracic Vascular Surgery)

## 2012-07-15 ENCOUNTER — Other Ambulatory Visit: Payer: Self-pay | Admitting: *Deleted

## 2012-07-15 ENCOUNTER — Other Ambulatory Visit: Payer: Self-pay | Admitting: Thoracic Surgery (Cardiothoracic Vascular Surgery)

## 2012-07-15 DIAGNOSIS — G8918 Other acute postprocedural pain: Secondary | ICD-10-CM

## 2012-07-15 MED ORDER — HYDROCODONE-ACETAMINOPHEN 7.5-500 MG PO TABS: 1.0000 | ORAL_TABLET | ORAL | Status: DC | PRN

## 2012-07-15 NOTE — Telephone Encounter (Signed)
Ordered generic Lortab 7.5/325mg  for pain.  Unable to enter in medication list d/t not being in [facility] formulary.

## 2012-07-16 ENCOUNTER — Other Ambulatory Visit: Payer: Self-pay | Admitting: Physician Assistant

## 2012-07-26 ENCOUNTER — Ambulatory Visit (HOSPITAL_COMMUNITY): Payer: Self-pay | Admitting: Psychiatry

## 2012-07-30 ENCOUNTER — Other Ambulatory Visit: Payer: Self-pay | Admitting: Thoracic Surgery (Cardiothoracic Vascular Surgery)

## 2012-07-30 ENCOUNTER — Other Ambulatory Visit: Payer: Self-pay

## 2012-08-12 ENCOUNTER — Ambulatory Visit (HOSPITAL_COMMUNITY): Payer: Self-pay | Admitting: Psychiatry

## 2012-08-15 ENCOUNTER — Other Ambulatory Visit: Payer: Self-pay | Admitting: Thoracic Surgery (Cardiothoracic Vascular Surgery)

## 2012-08-15 DIAGNOSIS — C3490 Malignant neoplasm of unspecified part of unspecified bronchus or lung: Secondary | ICD-10-CM

## 2012-08-16 ENCOUNTER — Other Ambulatory Visit: Payer: Self-pay | Admitting: Thoracic Surgery (Cardiothoracic Vascular Surgery)

## 2012-08-16 DIAGNOSIS — G8918 Other acute postprocedural pain: Secondary | ICD-10-CM

## 2012-08-16 MED ORDER — HYDROCODONE-ACETAMINOPHEN 7.5-325 MG PO TABS: 1.0000 | ORAL_TABLET | Freq: Four times a day (QID) | ORAL | Status: DC | PRN

## 2012-08-18 ENCOUNTER — Other Ambulatory Visit: Payer: Self-pay | Admitting: Thoracic Surgery (Cardiothoracic Vascular Surgery)

## 2012-08-19 ENCOUNTER — Other Ambulatory Visit: Payer: Self-pay | Admitting: *Deleted

## 2012-09-02 ENCOUNTER — Other Ambulatory Visit: Payer: Self-pay | Admitting: Thoracic Surgery (Cardiothoracic Vascular Surgery)

## 2012-09-03 ENCOUNTER — Other Ambulatory Visit: Payer: Self-pay | Admitting: Thoracic Surgery (Cardiothoracic Vascular Surgery)

## 2012-09-03 ENCOUNTER — Other Ambulatory Visit: Payer: Self-pay | Admitting: *Deleted

## 2012-09-10 ENCOUNTER — Other Ambulatory Visit: Payer: Self-pay | Admitting: Thoracic Surgery (Cardiothoracic Vascular Surgery)

## 2012-09-10 DIAGNOSIS — G8918 Other acute postprocedural pain: Secondary | ICD-10-CM

## 2012-09-16 ENCOUNTER — Ambulatory Visit (HOSPITAL_COMMUNITY): Payer: Self-pay | Admitting: Psychiatry

## 2012-09-17 ENCOUNTER — Encounter: Payer: Self-pay | Admitting: Thoracic Surgery (Cardiothoracic Vascular Surgery)

## 2012-09-17 ENCOUNTER — Ambulatory Visit (INDEPENDENT_AMBULATORY_CARE_PROVIDER_SITE_OTHER): Payer: Managed Care, Other (non HMO) | Admitting: Thoracic Surgery (Cardiothoracic Vascular Surgery)

## 2012-09-17 ENCOUNTER — Ambulatory Visit
Admission: RE | Admit: 2012-09-17 | Discharge: 2012-09-17 | Disposition: A | Discharge status: Home / Self Care | Payer: Managed Care, Other (non HMO) | Source: Ambulatory Visit | Attending: Thoracic Surgery (Cardiothoracic Vascular Surgery) | Admitting: Thoracic Surgery (Cardiothoracic Vascular Surgery)

## 2012-09-17 VITALS — BP 137/90 | HR 96 | Resp 20 | Ht 64.0 in | Wt 138.0 lb

## 2012-09-17 DIAGNOSIS — Z902 Acquired absence of lung [part of]: Secondary | ICD-10-CM

## 2012-09-17 DIAGNOSIS — C3490 Malignant neoplasm of unspecified part of unspecified bronchus or lung: Secondary | ICD-10-CM

## 2012-09-17 DIAGNOSIS — Z9889 Other specified postprocedural states: Secondary | ICD-10-CM

## 2012-09-17 DIAGNOSIS — Z09 Encounter for follow-up examination after completed treatment for conditions other than malignant neoplasm: Secondary | ICD-10-CM

## 2012-09-17 DIAGNOSIS — C3430 Malignant neoplasm of lower lobe, unspecified bronchus or lung: Secondary | ICD-10-CM

## 2012-09-17 NOTE — Progress Notes (Signed)
HPI:  Deborah Mullins. Deborah Mullins returns for a scheduled followup visit. She had a right lower lobectomy for stage IA non-small cell carcinoma on 05/23/2012. She was last seen in the office on 06/11/2012 at which time she was recovering well. At that time she was taking one hydrocodone tablet 2 or 3 times a day and is anxious to resume full length. Today she says that she's having extreme pain and is requiring 2 hydrocodone tablets 4 times daily. She feels that she can go back to work. She says that she is having difficulty sleeping and is not getting her proper rest. She complains of anxiety and depression and severe mood swings. She does have an appointment with the psychiatrist next week.  Past Medical History  Diagnosis Date  . Hypertension   . Cancer     breast   . Hyperlipidemia   . GERD (gastroesophageal reflux disease)   . Anxiety   . Allergic rhinitis   . Lung nodule 2009  . PONV (postoperative nausea and vomiting)   . Shortness of breath     exertion  . Headache   . Arthritis       Current Outpatient Prescriptions  Medication Sig Dispense Refill  . albuterol (PROVENTIL HFA) 108 (90 BASE) MCG/ACT inhaler Inhale 2 puffs into the lungs every 6 (six) hours as needed for wheezing.  1 Inhaler  1  . amitriptyline (ELAVIL) 25 MG tablet Take 25 mg by mouth at bedtime.      Marland Kitchen dexlansoprazole (DEXILANT) 60 MG capsule Take 60 mg by mouth daily.      Marland Kitchen Dextromethorphan-Guaifenesin (MUCINEX DM) 30-600 MG TB12 1-2 twice daily for cough  28 each  0  . hydrochlorothiazide (HYDRODIURIL) 25 MG tablet Take 25 mg by mouth daily.      . metoprolol tartrate (LOPRESSOR) 25 MG tablet Take 0.5 tablets (12.5 mg total) by mouth 2 (two) times daily.  30 tablet  1  . polyethylene glycol powder (GLYCOLAX/MIRALAX) powder Take 17 g by mouth daily as needed.      . simvastatin (ZOCOR) 80 MG tablet Take 80 mg by mouth at bedtime.        Physical Exam BP 137/90  Pulse 96  Resp 20  Ht 5\' 4"  (1.626 m)  Wt 138 lb  (62.596 kg)  BMI 23.69 kg/m2  SpO13 51% 62 year old woman is very anxious No cervical or supraclavicular adenopathy Incisions well healed Lungs clear with equal breath sounds bilaterally  Diagnostic Tests: CT of chest 09/17/12 *RADIOLOGY REPORT*  Clinical Data: History lung cancer. Rule out recurrence. Breast  cancer 2005. Right-sided lobectomy. Chest pain. Shortness of  breath. Weight gain. Remote smoking history.  CT CHEST WITHOUT CONTRAST  Technique: Multidetector CT imaging of the chest was performed  following the standard protocol without IV contrast.  Comparison: Plain film 06/07/2012. CT 04/25/2012.  Findings: Lungs/pleura: Surgical changes about the superior segment  right lower lobe.  Resultant volume loss at the right lung base with elevation of the  right hemidiaphragm. 1 mm subpleural right upper lobe nodules,  including images 29 and 39/series 3 are similar.  Mild radiation fibrosis within the anterior left upper lobe.  No pleural fluid.  Heart/Mediastinum: No axillary adenopathy. Normal heart size,  without pericardial effusion. Multivessel coronary artery  atherosclerosis. No mediastinal or definite hilar adenopathy,  given limitations of unenhanced CT.  Upper abdomen: Too small to characterize left hepatic lobe lesion  which is likely a cyst. Unchanged. Normal adrenal glands.  Bones/Musculoskeletal: No acute osseous  abnormality. Moderate  thoracic spondylosis.  IMPRESSION:  1. Interval surgical changes about the superior segment right  lower lobe. No evidence of recurrent/residual or metastatic  disease.  2. Age advanced coronary artery atherosclerosis. Recommend  assessment of coronary risk factors and consideration of medical  therapy.  Impression: Deborah Mullins [place] 62 year old woman who is now 4 months out from resection for stage IA non-small cell carcinoma. She has noticed recurrent disease. In our discussion she said that I told her that this cancer could never  come back. I made it clear to her that very reason for followup as because of the possibility to come back and there is no guarantee otherwise. I did explain to her that her prognosis is excellent given that this was a stage IA lesion.   She is having quite a difficult time financially and says that she cannot afford to pay for a CT scan at her next visit in 4 months. I explained the reasons the CT is superior to the plain chest x-ray, but will abide by her wishes and only do a plain chest x-ray at her next visit.  She became very upset and emotional during the visit and complained of mood swings and alternating depression and anxiety. She was evasive about answering questions about frequency and quantity of pain medications that she's taking. She was insistent that she needed to continue with hydrocodone for the pain and that Tylenol or Advil would not be adequate. I gave her a prescription for hydrocodone 5/325 one to 2 tablets 3 times daily as needed for pain, 60 tablets, no refills. Advised to try to use Tylenol and Advil for when the pain is not severe and to save the hydrocodone for when it was more severe. I stressed to her the importance of gradually wean herself off the narcotics to avoid dependence and loss of effectiveness.  I explained to her that is not uncommon to continue to have some discomfort after this type of surgery for many months.   Plan: Return in 4 months  We will do a PA and lateral chest x-ray at that time per the patient's request not to have a CT scan

## 2012-09-17 NOTE — Patient Instructions (Signed)
You were given a prescription for Hydrocodone for pain. You may take 1 to 2 pills up to 3 times per day.

## 2012-09-23 ENCOUNTER — Ambulatory Visit (INDEPENDENT_AMBULATORY_CARE_PROVIDER_SITE_OTHER): Payer: 59 | Admitting: Psychiatry

## 2012-09-23 ENCOUNTER — Encounter (HOSPITAL_COMMUNITY): Payer: Self-pay | Admitting: Psychiatry

## 2012-09-23 VITALS — Wt 141.6 lb

## 2012-09-23 DIAGNOSIS — E559 Vitamin D deficiency, unspecified: Secondary | ICD-10-CM | POA: Insufficient documentation

## 2012-09-23 DIAGNOSIS — F332 Major depressive disorder, recurrent severe without psychotic features: Secondary | ICD-10-CM

## 2012-09-23 DIAGNOSIS — F5105 Insomnia due to other mental disorder: Secondary | ICD-10-CM | POA: Insufficient documentation

## 2012-09-23 DIAGNOSIS — F329 Major depressive disorder, single episode, unspecified: Secondary | ICD-10-CM

## 2012-09-23 DIAGNOSIS — F419 Anxiety disorder, unspecified: Secondary | ICD-10-CM

## 2012-09-23 DIAGNOSIS — F411 Generalized anxiety disorder: Secondary | ICD-10-CM

## 2012-09-23 DIAGNOSIS — F418 Other specified anxiety disorders: Secondary | ICD-10-CM

## 2012-09-23 MED ORDER — CARBAMAZEPINE ER 100 MG PO TB12: 100.0000 mg | ORAL_TABLET | Freq: Two times a day (BID) | ORAL | Status: DC

## 2012-09-23 MED ORDER — PROPRANOLOL HCL 10 MG PO TABS: 10.0000 mg | ORAL_TABLET | Freq: Three times a day (TID) | ORAL | Status: DC

## 2012-09-23 MED ORDER — GABAPENTIN 100 MG PO CAPS: 100.0000 mg | ORAL_CAPSULE | Freq: Four times a day (QID) | ORAL | Status: DC

## 2012-09-23 MED ORDER — METHOCARBAMOL 500 MG PO TABS: 500.0000 mg | ORAL_TABLET | Freq: Three times a day (TID) | ORAL | Status: DC

## 2012-09-23 NOTE — Patient Instructions (Addendum)
Strongly consider attending at least 6 Alanon Meetings to help you learn about how your helping others to the exclusion of helping yourself is actually hurting yourself and is actually an addiction to fixing others and that you need to work the 12 Step to Happiness through the [employer]. Al-Anon Family Groups could be helpful with how to deal with substance abusing family and friends. Or your own issues of being in victim role.  There are only 40 Alanon Family Group meetings a week here in [place].  Online are current listing of those meetings @ [email or web address]  There are [employer].  Search on line and there you can learn the format and can access the schedule for yourself.  Their number is [phone]  The one in [place] is at the [facility] on [address]  The meeting is at 7 PM on Tuesdays.   Look in to [facility] Adult Children of Alcoholics on line, particularly the [employer] and The Recovery list  Native Wisdom for [employer] by Wyn Quaker Schafe may be an interesting resource for you.  Yoga is a very helpful exercise method.  On TV or on line [employer] is a source of high quality information about yoga and videos on yoga.  Renee Ramus is the world's number one video yoga instructor according to some experts.  There are exceptional health benefits that can be achieved through yoga.  The main principles of yoga is acceptance, no competition, no comparison, and no judgement.  It is exceptional in helping people meditate and get to a very relaxed state.   Relaxation is the ultimate solution for you.  You can seek it through tub baths, bubble baths, essential oils or incense, walking or chatting with friends, listening to soft music, watching a candle burn and just letting all thoughts go and appreciating the true essence of the Creator.   Could use "Move Free" or "Osteo bi Flex" for arthritic pain.   The important ingredients are  Chondrotin Sulfate and Glucosamine.  Tumeric is also helpful for arthritis.   Krill oil and cod liver oil may be helpful for arthritis.   [employer] is a great source for all of these.  [phone]  Ask doctor about Mobic or some other antiinflammatory to augment you pain management.  Call if problems or concerns.
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
## 2012-10-07 ENCOUNTER — Ambulatory Visit (HOSPITAL_COMMUNITY): Payer: Self-pay | Admitting: Psychiatry

## 2012-10-17 ENCOUNTER — Ambulatory Visit (HOSPITAL_COMMUNITY): Payer: Self-pay | Admitting: Psychiatry

## 2012-11-07 ENCOUNTER — Ambulatory Visit (INDEPENDENT_AMBULATORY_CARE_PROVIDER_SITE_OTHER): Payer: 59 | Admitting: Psychiatry

## 2012-11-07 ENCOUNTER — Encounter (HOSPITAL_COMMUNITY): Payer: Self-pay | Admitting: Psychiatry

## 2012-11-07 VITALS — Wt 146.2 lb

## 2012-11-07 DIAGNOSIS — F411 Generalized anxiety disorder: Secondary | ICD-10-CM

## 2012-11-07 DIAGNOSIS — G894 Chronic pain syndrome: Secondary | ICD-10-CM

## 2012-11-07 DIAGNOSIS — F5105 Insomnia due to other mental disorder: Secondary | ICD-10-CM

## 2012-11-07 DIAGNOSIS — F419 Anxiety disorder, unspecified: Secondary | ICD-10-CM

## 2012-11-07 DIAGNOSIS — M549 Dorsalgia, unspecified: Secondary | ICD-10-CM | POA: Insufficient documentation

## 2012-11-07 DIAGNOSIS — F332 Major depressive disorder, recurrent severe without psychotic features: Secondary | ICD-10-CM

## 2012-11-07 MED ORDER — METHOCARBAMOL 750 MG PO TABS: 750.0000 mg | ORAL_TABLET | Freq: Three times a day (TID) | ORAL | Status: DC

## 2012-11-07 MED ORDER — MELOXICAM 7.5 MG PO TABS: 7.5000 mg | ORAL_TABLET | Freq: Every day | ORAL | Status: DC

## 2012-11-07 MED ORDER — PROPRANOLOL HCL 10 MG PO TABS: 10.0000 mg | ORAL_TABLET | Freq: Three times a day (TID) | ORAL | Status: DC

## 2012-11-07 MED ORDER — DULOXETINE HCL 20 MG PO CPEP: 20.0000 mg | ORAL_CAPSULE | Freq: Two times a day (BID) | ORAL | Status: DC

## 2012-11-07 MED ORDER — GABAPENTIN 300 MG PO CAPS: 300.0000 mg | ORAL_CAPSULE | Freq: Four times a day (QID) | ORAL | Status: DC

## 2012-11-07 MED ORDER — CARBAMAZEPINE ER 200 MG PO TB12: 200.0000 mg | ORAL_TABLET | Freq: Two times a day (BID) | ORAL | Status: DC
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
## 2012-11-07 NOTE — Patient Instructions (Addendum)
CUT BACK/CUT OUT on sugar and carbohydrates, that means very limited fruits and starchy vegetables and very limited grains, breads  The goal is low GLYCEMIC INDEX.  CUT OUT all wheat, rye, or barley for the GLUTEN in them.  HIGH fat and LOW carbohydrate diet is the KEY.  Eat avocados, eggs, lean meat like grass fed beef and chicken  Nuts and seeds would be good foods as well.   Stevia is an excellent sweetener.  Safe for the brain.   Lowella Grip is also a good safe sweetener, not the baking blend form of Truvia  Almond butter is awesome.  Check out all this on the Internet.  Dr Heber Byron is on the Internet with some good info about this.   http://www.drperlmutter.com is where that is.  An excellent site for info on this diet is http://paloeleap.com  Lily's Chocolate makes dark chocolate that is sweetened with Stevia that is safe.  Could use "Move Free" or "Osteo bi Flex" for arthritic pain.   The important ingredients are Chondrotin Sulfate and Glucosamine.  Tumeric is also helpful for arthritis.   Krill oil and cod liver oil may be helpful for arthritis.   [employer] is a great source for all of these.  [phone]  Glori Luis is a mushroom that has the strongest antiinflammatory properties of any substance known to mankind.  Among other sources, it can be ordered from [facility].com  Call if problems or concerns.

## 2012-11-18 ENCOUNTER — Other Ambulatory Visit: Payer: Self-pay | Admitting: Thoracic Surgery (Cardiothoracic Vascular Surgery)

## 2012-12-05 ENCOUNTER — Telehealth (HOSPITAL_COMMUNITY): Payer: Self-pay | Admitting: Psychiatry

## 2012-12-05 DIAGNOSIS — F419 Anxiety disorder, unspecified: Secondary | ICD-10-CM

## 2012-12-05 DIAGNOSIS — F329 Major depressive disorder, single episode, unspecified: Secondary | ICD-10-CM

## 2012-12-05 MED ORDER — PROPRANOLOL HCL 10 MG PO TABS: 10.0000 mg | ORAL_TABLET | Freq: Three times a day (TID) | ORAL | Status: DC

## 2012-12-05 NOTE — Telephone Encounter (Signed)
90 days supply ordered as required by insurance without waiver or logic.

## 2012-12-06 ENCOUNTER — Encounter (HOSPITAL_COMMUNITY): Payer: Self-pay | Admitting: Psychiatry

## 2012-12-06 ENCOUNTER — Ambulatory Visit (INDEPENDENT_AMBULATORY_CARE_PROVIDER_SITE_OTHER): Payer: 59 | Admitting: Psychiatry

## 2012-12-06 VITALS — BP 145/90 | Ht 63.5 in | Wt 151.2 lb

## 2012-12-06 DIAGNOSIS — G8929 Other chronic pain: Secondary | ICD-10-CM

## 2012-12-06 DIAGNOSIS — F411 Generalized anxiety disorder: Secondary | ICD-10-CM

## 2012-12-06 DIAGNOSIS — F332 Major depressive disorder, recurrent severe without psychotic features: Secondary | ICD-10-CM

## 2012-12-06 DIAGNOSIS — F419 Anxiety disorder, unspecified: Secondary | ICD-10-CM

## 2012-12-06 DIAGNOSIS — F418 Other specified anxiety disorders: Secondary | ICD-10-CM

## 2012-12-06 MED ORDER — CARBAMAZEPINE ER 200 MG PO TB12: 200.0000 mg | ORAL_TABLET | Freq: Two times a day (BID) | ORAL | Status: DC

## 2012-12-06 MED ORDER — DESVENLAFAXINE SUCCINATE ER 50 MG PO TB24: 50.0000 mg | ORAL_TABLET | Freq: Every day | ORAL | Status: DC

## 2012-12-06 MED ORDER — HYDROCHLOROTHIAZIDE 25 MG PO TABS: 25.0000 mg | ORAL_TABLET | Freq: Every day | ORAL | Status: DC

## 2012-12-06 MED ORDER — AMITRIPTYLINE HCL 10 MG PO TABS: 20.0000 mg | ORAL_TABLET | Freq: Every day | ORAL | Status: DC

## 2012-12-06 MED ORDER — GABAPENTIN 300 MG PO CAPS: 300.0000 mg | ORAL_CAPSULE | Freq: Four times a day (QID) | ORAL | Status: DC

## 2012-12-06 MED ORDER — PROPRANOLOL HCL 10 MG PO TABS: 10.0000 mg | ORAL_TABLET | Freq: Three times a day (TID) | ORAL | Status: DC
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
## 2012-12-06 NOTE — Patient Instructions (Addendum)
CUT BACK/CUT OUT on sugar and carbohydrates, that means very limited fruits and starchy vegetables and very limited grains, breads  The goal is low GLYCEMIC INDEX.  CUT OUT all wheat, rye, or barley for the GLUTEN in them.  HIGH fat and LOW carbohydrate diet is the KEY.  Eat avocados, eggs, lean meat like grass fed beef and chicken  Nuts and seeds would be good foods as well.   Stevia is an excellent sweetener.  Safe for the brain.   Lowella Grip is also a good safe sweetener, not the baking blend form of Truvia  Almond butter is awesome.  Check out all this on the Internet.  Dr Heber Lumberton is on the Internet with some good info about this.   http://www.drperlmutter.com is where that is.  An excellent site for info on this diet is http://paleoleap.com  [employer]ily's [employer] makes dark chocolate that is sweetened with Stevia that is safe.  Strongly consider attending at least 6 Alanon Meetings to help you learn about how your helping others to the exclusion of helping yourself is actually hurting yourself and is actually an addiction to fixing others and that you need to work the 12 Step to Happiness through the [employer]. Al-Anon Family Groups could be helpful with how to deal with substance abusing family and friends. Or your own issues of being in victim role.  There are only 40 Alanon Family Group meetings a week here in [place].  Online are current listing of those meetings @ [place]alanon.org/html/meetings.html  There are [employer].  Search on line and there you can learn the format and can access the schedule for yourself.  Their number is [phone]  "Native Wisdom for White Minds" by Camelia Phenes is a book that could be very helpful.  Some have gotten it on [employer].com for very low cost.  Meditation means getting in a quiet place physically, mentally, and emotionally so that your spiritual being can be ope and willing to hear your creator's directions  and love.  Check out mindfulness at this web site.  VegetarianPizzas.hu  Get started with some counseling.  Take care of yourself.  No one else is standing up to do the job and only you know what you need.   GET SERIOUS about taking care of yourself.  Do the next right thing and that often means doing something to care for yourself along the lines of are you hungry, are you angry, are you lonely, are you tired, are you scared?  HAL[employer] is what that stands for.  Call if problems or concerns.

## 2012-12-16 ENCOUNTER — Other Ambulatory Visit: Payer: Self-pay | Admitting: Thoracic Surgery (Cardiothoracic Vascular Surgery)

## 2012-12-17 ENCOUNTER — Telehealth (HOSPITAL_COMMUNITY): Payer: Self-pay | Admitting: Psychiatry

## 2012-12-17 DIAGNOSIS — G894 Chronic pain syndrome: Secondary | ICD-10-CM

## 2012-12-17 MED ORDER — METHOCARBAMOL 750 MG PO TABS: 750.0000 mg | ORAL_TABLET | Freq: Three times a day (TID) | ORAL | Status: DC

## 2012-12-17 NOTE — Telephone Encounter (Signed)
renewed Robaxin via eScripts for 90 days

## 2013-01-03 ENCOUNTER — Ambulatory Visit (INDEPENDENT_AMBULATORY_CARE_PROVIDER_SITE_OTHER): Payer: 59 | Admitting: Psychiatry

## 2013-01-03 ENCOUNTER — Encounter (HOSPITAL_COMMUNITY): Payer: Self-pay | Admitting: Psychiatry

## 2013-01-03 VITALS — BP 136/86 | Ht 63.5 in | Wt 149.0 lb

## 2013-01-03 DIAGNOSIS — M549 Dorsalgia, unspecified: Secondary | ICD-10-CM

## 2013-01-03 DIAGNOSIS — F419 Anxiety disorder, unspecified: Secondary | ICD-10-CM

## 2013-01-03 DIAGNOSIS — F5105 Insomnia due to other mental disorder: Secondary | ICD-10-CM

## 2013-01-03 DIAGNOSIS — F332 Major depressive disorder, recurrent severe without psychotic features: Secondary | ICD-10-CM

## 2013-01-03 DIAGNOSIS — F411 Generalized anxiety disorder: Secondary | ICD-10-CM

## 2013-01-03 MED ORDER — PROPRANOLOL HCL 10 MG PO TABS: 10.0000 mg | ORAL_TABLET | Freq: Three times a day (TID) | ORAL | Status: DC

## 2013-01-03 MED ORDER — DESVENLAFAXINE SUCCINATE ER 100 MG PO TB24: 100.0000 mg | ORAL_TABLET | Freq: Every day | ORAL | Status: DC
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
## 2013-01-03 NOTE — Patient Instructions (Signed)
Keep searching out for a family doctor.  Set a timer for 8 or a certain number minutes and walk for that amount of time in the house or in the yard.  Mark the number of minutes on a calendar for that day.  Do that every day this week.  Then next week increase the time by 1 minutes and then mark the calendar with the number of minutes for that day.  Each week increase your exercise by one minute.  Keep a record of this so you can see the progress you are making.  Do this every day, just like eating and sleeping.  It is good for pain control, depression, and for your soul/spirit.  Bring the record in for your next visit so we can talk about your effort and how you feel with the new exercise program going and working for you.  Relaxation is the ultimate solution for you.  You can seek it through tub baths, bubble baths, essential oils or incense, walking or chatting with friends, listening to soft music, watching a candle burn and just letting all thoughts go and appreciating the true essence of the Creator.  Pets or animals may be very helpful.  You might spend some time with them and then go do more directed meditation.  "I am Wishes Fulfilled Meditation" by Marylene Buerger and Lyndal Pulley may be helpful MUSIC for getting to sleep or for meditating You can order it from on line.  You might find the Chill channel on Pandora and explore the artists that you like better.   Take care of yourself.  No one else is standing up to do the job and only you know what you need.   GET SERIOUS about taking care of yourself.  Do the next right thing and that often means doing something to care for yourself along the lines of are you hungry, are you angry, are you lonely, are you tired, are you scared?  HALTS is what that stands for.  Call if problems or concerns.

## 2013-01-28 ENCOUNTER — Ambulatory Visit: Payer: Managed Care, Other (non HMO) | Admitting: Thoracic Surgery (Cardiothoracic Vascular Surgery)

## 2013-01-31 ENCOUNTER — Encounter (HOSPITAL_COMMUNITY): Payer: Self-pay | Admitting: Psychiatry

## 2013-02-03 ENCOUNTER — Other Ambulatory Visit (HOSPITAL_COMMUNITY): Payer: Self-pay | Admitting: Psychiatry

## 2013-02-03 ENCOUNTER — Telehealth (HOSPITAL_COMMUNITY): Payer: Self-pay | Admitting: Psychiatry

## 2013-02-03 NOTE — Progress Notes (Signed)
This encounter was created in error - please disregard.

## 2013-02-04 NOTE — Telephone Encounter (Signed)
Pt calls and requests samples of Pristiq.  There are no samples, but online there is a way to order drug company supply. The home number used and on file is not a working number.

## 2013-02-05 ENCOUNTER — Encounter (HOSPITAL_COMMUNITY): Payer: Self-pay | Admitting: Psychiatry

## 2013-02-05 ENCOUNTER — Ambulatory Visit (INDEPENDENT_AMBULATORY_CARE_PROVIDER_SITE_OTHER): Payer: 59 | Admitting: Psychiatry

## 2013-02-05 VITALS — BP 126/72 | Ht 63.25 in | Wt 153.2 lb

## 2013-02-05 DIAGNOSIS — F332 Major depressive disorder, recurrent severe without psychotic features: Secondary | ICD-10-CM

## 2013-02-05 DIAGNOSIS — F411 Generalized anxiety disorder: Secondary | ICD-10-CM

## 2013-02-05 DIAGNOSIS — G8929 Other chronic pain: Secondary | ICD-10-CM

## 2013-02-05 DIAGNOSIS — F418 Other specified anxiety disorders: Secondary | ICD-10-CM

## 2013-02-05 DIAGNOSIS — M549 Dorsalgia, unspecified: Secondary | ICD-10-CM

## 2013-02-05 DIAGNOSIS — F329 Major depressive disorder, single episode, unspecified: Secondary | ICD-10-CM

## 2013-02-05 DIAGNOSIS — G894 Chronic pain syndrome: Secondary | ICD-10-CM

## 2013-02-05 MED ORDER — METHOCARBAMOL 750 MG PO TABS: 750.0000 mg | ORAL_TABLET | Freq: Three times a day (TID) | ORAL | Status: DC

## 2013-02-05 MED ORDER — DESVENLAFAXINE SUCCINATE ER 100 MG PO TB24: 100.0000 mg | ORAL_TABLET | Freq: Every day | ORAL | Status: DC

## 2013-02-05 MED ORDER — AMITRIPTYLINE HCL 10 MG PO TABS: 20.0000 mg | ORAL_TABLET | Freq: Every day | ORAL | Status: DC

## 2013-02-05 MED ORDER — POLYETHYLENE GLYCOL 3350 17 GM/SCOOP PO POWD: 17.0000 g | Freq: Every day | ORAL | Status: DC | PRN

## 2013-02-05 MED ORDER — CARBAMAZEPINE ER 200 MG PO TB12: 200.0000 mg | ORAL_TABLET | Freq: Two times a day (BID) | ORAL | Status: DC

## 2013-02-05 MED ORDER — GABAPENTIN 100 MG PO CAPS: 200.0000 mg | ORAL_CAPSULE | Freq: Four times a day (QID) | ORAL | Status: DC

## 2013-02-05 NOTE — Addendum Note (Signed)
Addended by: Mike Craze on: 02/05/2013 12:25 PM   Modules accepted: Orders

## 2013-02-05 NOTE — Patient Instructions (Signed)
Keep it up.  Find some creative outlet.  Call if problems or concerns.
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
## 2013-02-19 ENCOUNTER — Encounter (HOSPITAL_COMMUNITY): Payer: Self-pay

## 2013-02-19 ENCOUNTER — Emergency Department (HOSPITAL_COMMUNITY)
Admission: EM | Admit: 2013-02-19 | Discharge: 2013-02-19 | Disposition: A | Discharge status: Home / Self Care | Payer: Managed Care, Other (non HMO) | Attending: Emergency Medicine | Admitting: Emergency Medicine

## 2013-02-19 DIAGNOSIS — I10 Essential (primary) hypertension: Secondary | ICD-10-CM | POA: Insufficient documentation

## 2013-02-19 DIAGNOSIS — T63461A Toxic effect of venom of wasps, accidental (unintentional), initial encounter: Secondary | ICD-10-CM | POA: Insufficient documentation

## 2013-02-19 DIAGNOSIS — Z87891 Personal history of nicotine dependence: Secondary | ICD-10-CM | POA: Insufficient documentation

## 2013-02-19 DIAGNOSIS — Z853 Personal history of malignant neoplasm of breast: Secondary | ICD-10-CM | POA: Insufficient documentation

## 2013-02-19 DIAGNOSIS — Z8739 Personal history of other diseases of the musculoskeletal system and connective tissue: Secondary | ICD-10-CM | POA: Insufficient documentation

## 2013-02-19 DIAGNOSIS — Y9389 Activity, other specified: Secondary | ICD-10-CM | POA: Insufficient documentation

## 2013-02-19 DIAGNOSIS — Z880 Allergy status to penicillin: Secondary | ICD-10-CM | POA: Insufficient documentation

## 2013-02-19 DIAGNOSIS — Z8709 Personal history of other diseases of the respiratory system: Secondary | ICD-10-CM | POA: Insufficient documentation

## 2013-02-19 DIAGNOSIS — T6391XA Toxic effect of contact with unspecified venomous animal, accidental (unintentional), initial encounter: Secondary | ICD-10-CM | POA: Insufficient documentation

## 2013-02-19 DIAGNOSIS — Z79899 Other long term (current) drug therapy: Secondary | ICD-10-CM | POA: Insufficient documentation

## 2013-02-19 DIAGNOSIS — K219 Gastro-esophageal reflux disease without esophagitis: Secondary | ICD-10-CM | POA: Insufficient documentation

## 2013-02-19 DIAGNOSIS — T7840XA Allergy, unspecified, initial encounter: Secondary | ICD-10-CM

## 2013-02-19 DIAGNOSIS — F411 Generalized anxiety disorder: Secondary | ICD-10-CM | POA: Insufficient documentation

## 2013-02-19 DIAGNOSIS — Y92009 Unspecified place in unspecified non-institutional (private) residence as the place of occurrence of the external cause: Secondary | ICD-10-CM | POA: Insufficient documentation

## 2013-02-19 DIAGNOSIS — E785 Hyperlipidemia, unspecified: Secondary | ICD-10-CM | POA: Insufficient documentation

## 2013-02-19 MED ORDER — DIPHENHYDRAMINE HCL 25 MG PO CAPS
50.0000 mg | ORAL_CAPSULE | Freq: Once | ORAL | Status: AC
  Administered 2013-02-19: 50 mg via ORAL
  Filled 2013-02-19: qty 2

## 2013-02-19 MED ORDER — PREDNISONE 50 MG PO TABS
60.0000 mg | ORAL_TABLET | Freq: Once | ORAL | Status: AC
  Administered 2013-02-19: 60 mg via ORAL
  Filled 2013-02-19: qty 1

## 2013-02-19 MED ORDER — EPINEPHRINE 0.3 MG/0.3ML IJ SOAJ: 0.3000 mg | Freq: Once | INTRAMUSCULAR | Status: DC

## 2013-02-19 NOTE — ED Provider Notes (Signed)
History  This chart was scribed for Donnetta Hutching, MD by Ardelia Mems, ED Scribe. This patient was seen in room APAH8/[facility]8 and the patient's care was started at 5:35 PM.  CSN: [id number]  Arrival date & time 02/19/13  1706   Chief Complaint  Patient presents with  . Insect Bite    The history is provided by the patient. No language interpreter was used.   HPI Comments: Deborah Mullins is a 62 y.o. female with a hx of HTN who presents to the Emergency Department complaining of a bee sting on her left lateral foot that occurred about 45 minutes ago. There is a small, 2 cm area of redness at the site of the sting. Pt reports a hx of severe bee sting allergies. Pt states that her husband administered her epi-pen at home, soon after the sting, and pt denies taking any other medications. Pt states that she is feeling well, with the exception of muscle soreness in her thighs. Pt denies any throat swelling, SOB, rash, fever or any other symptoms. Pt denies alcohol use and is a former smoker.  PCP- Dr. Thayer Headings  Past Medical History  Diagnosis Date  . Hypertension   . Cancer     breast   . Hyperlipidemia   . GERD (gastroesophageal reflux disease)   . Anxiety   . Allergic rhinitis   . Lung nodule 2009  . PONV (postoperative nausea and vomiting)   . Shortness of breath     exertion  . Headache(784.0)   . Arthritis    Past Surgical History  Procedure Laterality Date  . Tubal ligation    . Tonsillectomy    . Cesarean section    . Breast surgery  2005    lumpectomy - left  . Lung cancer surgery Right 05/23/2012    reportedly clear   Family History  Problem Relation Age of Onset  . Heart disease Mother     CHF  . Physical abuse Mother   . Depression Mother   . Cancer Sister   . ADD / ADHD Sister   . Alcohol abuse Father   . Alcohol abuse Brother   . Mental illness Brother   . Anxiety disorder Neg Hx   . Bipolar disorder Neg Hx   . Dementia Neg Hx   . Drug abuse Neg Hx    . Paranoid behavior Neg Hx   . Schizophrenia Neg Hx   . Seizures Neg Hx   . Sexual abuse Neg Hx   . Cancer Sister   . Heart disease Sister   . Physical abuse Sister   . OCD Sister   . Mental illness Sister    History  Substance Use Topics  . Smoking status: Former Smoker -- 1.00 packs/day for 10 years    Types: Cigarettes    Quit date: 08/14/1974  . Smokeless tobacco: Never Used  . Alcohol Use: No   OB History   Grav Para Term Preterm Abortions TAB SAB Ect Mult Living                 Review of Systems  Constitutional: Negative for fever and chills.  HENT: Negative for congestion, sore throat, rhinorrhea and neck pain.   Respiratory: Negative for cough, shortness of breath and wheezing.   Cardiovascular: Negative for chest pain.  Gastrointestinal: Negative for nausea, vomiting, abdominal pain and diarrhea.  Genitourinary: Negative for dysuria and hematuria.  Musculoskeletal: Negative for back pain.  Skin: Negative for rash.  Neurological: Negative for headaches.  Psychiatric/Behavioral: Negative for confusion.   A complete 10 system review of systems was obtained and all systems are negative except as noted in the HPI and PMH.   Allergies  Bee venom and Penicillins  Home Medications   Current Outpatient Rx  Name  Route  Sig  Dispense  Refill  . amitriptyline (ELAVIL) 25 MG tablet   Oral   Take 25 mg by mouth at bedtime.         . carbamazepine (TEGRETOL XR) 200 MG 12 hr tablet   Oral   Take 1 tablet (200 mg total) by mouth 2 (two) times daily.   180 tablet   0     90 DAY SUPPLY REQUIRED BY INSURANCE WITHOUT WAIVER ...   . desvenlafaxine (PRISTIQ) 100 MG 24 hr tablet   Oral   Take 1 tablet (100 mg total) by mouth daily.   90 tablet   0     90 day supply required by pharmacy coverage   . dexlansoprazole (DEXILANT) 60 MG capsule   Oral   Take 60 mg by mouth daily.         Marland Kitchen EPINEPHrine (EPIPEN 2-PAK) 0.3 mg/0.3 mL SOAJ   Intramuscular   Inject  0.3 mg into the muscle once.         . gabapentin (NEURONTIN) 400 MG capsule   Oral   Take 400 mg by mouth 2 (two) times daily.         . hydrochlorothiazide (HYDRODIURIL) 25 MG tablet   Oral   Take 25 mg by mouth daily.         . methocarbamol (ROBAXIN) 750 MG tablet   Oral   Take 1 tablet (750 mg total) by mouth 3 (three) times daily.   270 tablet   0   . polyethylene glycol powder (GLYCOLAX/MIRALAX) powder   Oral   Take 17 g by mouth daily as needed (constipation).   850 g   0   . simvastatin (ZOCOR) 80 MG tablet   Oral   Take 80 mg by mouth at bedtime.          Triage Vitals: BP 125/78  Pulse 111  Temp(Src) 98 F (36.7 C) (Oral)  Resp 18  Ht 5\' 3"  (1.6 m)  Wt 150 lb (68.04 kg)  BMI 26.58 kg/m2  SpO2 98%  Physical Exam  Nursing note and vitals reviewed. Constitutional: She is oriented to person, place, and time. She appears well-developed and well-nourished.  HENT:  Head: Normocephalic and atraumatic.  Eyes: Conjunctivae and EOM are normal. Pupils are equal, round, and reactive to light.  Neck: Normal range of motion. Neck supple.  Cardiovascular: Normal rate, regular rhythm and normal heart sounds.   Pulmonary/Chest: Effort normal and breath sounds normal.  Abdominal: Soft. Bowel sounds are normal.  Musculoskeletal: Normal range of motion.  Neurological: She is alert and oriented to person, place, and time.  Skin: Skin is warm and dry.  Small, 2 cm area of swelling to left lateral foot.  Psychiatric: She has a normal mood and affect.    ED Course  Procedures (including critical care time)  DIAGNOSTIC STUDIES: Oxygen Saturation is 98% on RA, normal by my interpretation.    COORDINATION OF CARE: 5:40 PM- Pt advised of plan for treatment, including prednisone and benadryl and pt agrees.  Medications  predniSONE (DELTASONE) tablet 60 mg (60 mg Oral Given 02/19/13 1828)  diphenhydrAMINE (BENADRYL) capsule 50 mg (50 mg Oral Given  02/19/13 1828)     Labs Reviewed - No data to display  No results found.  1. Allergic reaction, initial encounter     MDM  Status post bee sting on left foot.   Patient used EpiPen immediately. She is feeling better now. No dyspnea, trouble swallowing, pruritus.   Patient was observed for approximately one hour and discharged.        I personally performed the services described in this documentation, which was scribed in my presence. The recorded information has been reviewed and is accurate.    Donnetta Hutching, MD 02/22/13 [phone]

## 2013-02-19 NOTE — ED Notes (Signed)
Pt reports was stung by a bee on left foot.  Reports used a epi pen at home.  Pt says feels ok.  Denies any throat swelling or difficulty breathing.

## 2013-02-26 ENCOUNTER — Ambulatory Visit (INDEPENDENT_AMBULATORY_CARE_PROVIDER_SITE_OTHER): Payer: 59 | Admitting: Psychiatry

## 2013-02-26 ENCOUNTER — Encounter (HOSPITAL_COMMUNITY): Payer: Self-pay | Admitting: Psychiatry

## 2013-02-26 DIAGNOSIS — F329 Major depressive disorder, single episode, unspecified: Secondary | ICD-10-CM

## 2013-03-05 NOTE — Patient Instructions (Signed)
Discussed orally 
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
## 2013-03-06 ENCOUNTER — Ambulatory Visit (INDEPENDENT_AMBULATORY_CARE_PROVIDER_SITE_OTHER): Payer: 59 | Admitting: Psychiatry

## 2013-03-06 DIAGNOSIS — F329 Major depressive disorder, single episode, unspecified: Secondary | ICD-10-CM
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
## 2013-03-11 NOTE — Patient Instructions (Signed)
Discussed orally 

## 2013-03-13 ENCOUNTER — Ambulatory Visit (HOSPITAL_COMMUNITY): Payer: Self-pay | Admitting: Psychiatry

## 2013-03-20 ENCOUNTER — Ambulatory Visit (HOSPITAL_COMMUNITY): Payer: Self-pay | Admitting: Psychiatry

## 2013-04-23 ENCOUNTER — Encounter: Payer: Self-pay | Admitting: Physician Assistant

## 2013-04-23 ENCOUNTER — Ambulatory Visit (INDEPENDENT_AMBULATORY_CARE_PROVIDER_SITE_OTHER): Payer: Managed Care, Other (non HMO) | Admitting: Physician Assistant

## 2013-04-23 VITALS — BP 124/80 | HR 60 | Temp 97.9°F | Resp 20 | Ht 63.0 in | Wt 152.0 lb

## 2013-04-23 DIAGNOSIS — F341 Dysthymic disorder: Secondary | ICD-10-CM

## 2013-04-23 DIAGNOSIS — E785 Hyperlipidemia, unspecified: Secondary | ICD-10-CM

## 2013-04-23 DIAGNOSIS — I10 Essential (primary) hypertension: Secondary | ICD-10-CM

## 2013-04-23 DIAGNOSIS — C3492 Malignant neoplasm of unspecified part of left bronchus or lung: Secondary | ICD-10-CM

## 2013-04-23 DIAGNOSIS — C3490 Malignant neoplasm of unspecified part of unspecified bronchus or lung: Secondary | ICD-10-CM

## 2013-04-23 DIAGNOSIS — F329 Major depressive disorder, single episode, unspecified: Secondary | ICD-10-CM | POA: Insufficient documentation

## 2013-04-23 DIAGNOSIS — K219 Gastro-esophageal reflux disease without esophagitis: Secondary | ICD-10-CM

## 2013-04-23 DIAGNOSIS — F319 Bipolar disorder, unspecified: Secondary | ICD-10-CM | POA: Insufficient documentation

## 2013-04-23 DIAGNOSIS — F419 Anxiety disorder, unspecified: Secondary | ICD-10-CM | POA: Insufficient documentation

## 2013-04-23 DIAGNOSIS — C50919 Malignant neoplasm of unspecified site of unspecified female breast: Secondary | ICD-10-CM

## 2013-04-23 MED ORDER — HYDROCHLOROTHIAZIDE 25 MG PO TABS: 25.0000 mg | ORAL_TABLET | Freq: Every day | ORAL | Status: DC

## 2013-04-23 NOTE — Progress Notes (Signed)
Patient ID: Deborah Mullins MRN: [id number], DOB: Jan 27, 1951, 62 y.o. Date of Encounter: @DATE @  Chief Complaint:  Chief Complaint  Patient presents with  . new pt est care    med refills    HPI: 62 y.o. year old white female  presents as a new patient to our office to establish care here.  She reports that her prior PCP was Dr. Ronne Binning at [facility]. She says she last saw him in September. She says at that time Dr. Ronne Binning had her followup with pulmonologist Dr. Solon Augusta. She subsequently under went right lower lobectomy for stage IA non-small cell carcinoma on 05/23/2012 by Dr. Dorris Fetch. I reviewed his last office visit note which is dated 09/17/12 .  She had a followup CT scan of the chest 09/17/12. There was no evidence of recurrent/residual or metastatic disease.  At that visit she reported to him that she was having a difficult time financially and says she cannot afford to pay for future CT scan at her next visit in 4 months. He explained to her the reasons for the CT is superior to plain chest x-ray. Any and he did state that he would abide by her wishes and only do a plain chest x-ray at her followup next visit.  He noted that she became very upset and emotional during the visit and complained of mood swings and alternating depression and anxiety. As well she is very evasive about answering questions about the frequency and quantity of pain medications that she was taking. At that visit she was insistent that she needed to continue hydrocodone for the pain and the Tylenol and Advil were not adequate. He gave her a prescription for hydrocodone 5/325 #60 with 0 refills. He stressed the importance of gradually weaning off of the narcotics to avoid dependence and loss of effectiveness.  She was to follow up with him in 4 months and he would do a PA lateral chest x-ray at that time given her request not to have a CT scan for financial purposes.  It is noted that today at the end of  today's visit I was getting right to exit the random she mentioned some pain in her arms and states that she needs some pain medicine. She later got distracted with needing a refill on her hydrochlorothiazide and the subject the pain medication was dropped.  Other than that one mention of some pain she had no other active complaints today. He spent the time reviewing the above history and her records.  She is taking her blood pressure medication without adverse effects. No lightheadedness.  She is taking her cholesterol medicine as directed. No myalgias or other adverse effects.  She reports that the only physicians that she sees on a routine basis at this time are: Dr. Andrey Spearman the [facility] at [facility] in [place]    Past Medical History  Diagnosis Date  . Hypertension   . Cancer     breast   . Hyperlipidemia   . GERD (gastroesophageal reflux disease)   . Allergic rhinitis   . Lung nodule 2009  . PONV (postoperative nausea and vomiting)   . Shortness of breath     exertion  . Headache(784.0)   . Arthritis   . Anxiety   . Bipolar 1 disorder   . Depression      Home Meds: See attached medication section for current medication list. Any medications entered into computer today will not appear on this note's list.  The medications listed below were entered prior to today. Current Outpatient Prescriptions on File Prior to Visit  Medication Sig Dispense Refill  . amitriptyline (ELAVIL) 25 MG tablet Take 25 mg by mouth at bedtime.      . carbamazepine (TEGRETOL XR) 200 MG 12 hr tablet Take 1 tablet (200 mg total) by mouth 2 (two) times daily.  180 tablet  0  . dexlansoprazole (DEXILANT) 60 MG capsule Take 60 mg by mouth daily.      Marland Kitchen EPINEPHrine (EPI-PEN) 0.3 mg/0.3 mL SOAJ Inject 0.3 mLs (0.3 mg total) into the muscle once.  1 Device  3  . gabapentin (NEURONTIN) 400 MG capsule Take 400 mg by mouth 2 (two) times daily.      . methocarbamol (ROBAXIN)  750 MG tablet Take 1 tablet (750 mg total) by mouth 3 (three) times daily.  270 tablet  0  . polyethylene glycol powder (GLYCOLAX/MIRALAX) powder Take 17 g by mouth daily as needed (constipation).  850 g  0  . simvastatin (ZOCOR) 80 MG tablet Take 80 mg by mouth at bedtime.       No current facility-administered medications on file prior to visit.    Allergies:  Allergies  Allergen Reactions  . Bee Venom Anaphylaxis  . Penicillins Rash    History   Social History  . Marital Status: Legally Separated    Spouse Name: N/A    Number of Children: 1  . Years of Education: N/A   Occupational History  . retired     Science writer   Social History Main Topics  . Smoking status: Former Smoker -- 1.00 packs/day for 10 years    Types: Cigarettes    Quit date: 08/14/1974  . Smokeless tobacco: Never Used  . Alcohol Use: No  . Drug Use: No  . Sexual Activity: No   Other Topics Concern  . Not on file   Social History Narrative   Married.    Both pt and her husband have bipolar. She says he "Gets moody and leaves for a while" then comes back.    "Not very supportive."    Family History  Problem Relation Age of Onset  . Heart disease Mother     CHF  . Physical abuse Mother   . Depression Mother   . Cancer Sister   . ADD / ADHD Sister   . Alcohol abuse Father   . Alcohol abuse Brother   . Mental illness Brother   . Anxiety disorder Neg Hx   . Bipolar disorder Neg Hx   . Dementia Neg Hx   . Drug abuse Neg Hx   . Paranoid behavior Neg Hx   . Schizophrenia Neg Hx   . Seizures Neg Hx   . Sexual abuse Neg Hx   . Cancer Sister   . Heart disease Sister   . Physical abuse Sister   . OCD Sister   . Mental illness Sister      Review of Systems:  See HPI for pertinent ROS. All other ROS negative.    Physical Exam: Blood pressure 124/80, pulse 60, temperature 97.9 F (36.6 C), temperature source Oral, resp. rate 20, height 5\' 3"  (1.6 m), weight 152 lb (68.947 kg).,  Body mass index is 26.93 kg/(m^2). General: Well-nourished well-developed white female . Appears in no acute distress. Neck: Supple. No thyromegaly. No lymphadenopathy. No carotid bruits. Lungs: Clear bilaterally to auscultation without wheezes, rales, or rhonchi. Breathing is unlabored. Heart: RRR with S1 S2.  No murmurs, rubs, or gallops. Abdomen: Soft, non-tender, non-distended with normoactive bowel sounds. No hepatomegaly. No rebound/guarding. No obvious abdominal masses. Musculoskeletal:  Strength and tone normal for age. Extremities/Skin: Warm and dry.  No edema.  Neuro: Alert and oriented X 3. Moves all extremities spontaneously. Gait is normal. CNII-XII grossly in tact. Psych:  Responds to questions appropriately with a normal affect--except when I was documenting social history and asked if she was married or lived alone, she became tearful talking about her husband (see social history )     ASSESSMENT AND PLAN:  62 y.o. year old female with  1. HYPERLIPIDEMIA Need to check an FLP/LFT. However she is not fasting at this time. As well she is concerned of what lab her insurance covers. Therefore I wrote out in order for her labs on a piece of paper that she can take to any lab. Results will be faxed to [place] and I will then followup with her   2. HTN (hypertension) At goal. Continue current medication. Check labs as above - hydrochlorothiazide (HYDRODIURIL) 25 MG tablet; Take 1 tablet (25 mg total) by mouth daily.  Dispense: 90 tablet; Refill: 0  3. GASTROESOPHAGEAL REFLUX DISEASE Total current medication  4.H/O ADENOCARCINOMA, BREAST   5. H/O Adenocarcinoma of lung, stage 1, left Follow up with Dr. Dorris Fetch as above  6. Anxiety and depression Follow up with psychiatry. Continue current psych  medicines in the interim  7. Narcotic seeking behavior: See history of present illness. We'll avoid giving her narcotics in the future.   NO NARCOTICS NO NARCOTIC S  We'll discuss  preventative care at her next followup office visit.   [address] [place], [place], [facility] 04/23/2013 3:54 PM

## 2013-05-01 ENCOUNTER — Other Ambulatory Visit: Payer: Self-pay | Admitting: Physician Assistant

## 2013-05-01 ENCOUNTER — Other Ambulatory Visit: Payer: 59

## 2013-05-01 DIAGNOSIS — E785 Hyperlipidemia, unspecified: Secondary | ICD-10-CM

## 2013-05-01 DIAGNOSIS — I10 Essential (primary) hypertension: Secondary | ICD-10-CM

## 2013-05-01 LAB — LIPID PANEL
HDL: 58 mg/dL (ref >39)
LDL Cholesterol: 143 mg/dL — ABNORMAL HIGH (ref 0–99)
Total CHOL/HDL Ratio: 4.1 Ratio
VLDL: 34 mg/dL (ref 0–40)

## 2013-05-01 LAB — COMPREHENSIVE METABOLIC PANEL
ALT: 17 U/L (ref 0–35)
Alkaline Phosphatase: 69 U/L (ref 39–117)
Sodium: 138 mEq/L (ref 135–145)
Total Bilirubin: 0.4 mg/dL (ref 0.3–1.2)
Total Protein: 7 g/dL (ref 6.0–8.3)

## 2013-05-08 ENCOUNTER — Telehealth: Payer: Self-pay | Admitting: Family Medicine

## 2013-05-08 DIAGNOSIS — E785 Hyperlipidemia, unspecified: Secondary | ICD-10-CM

## 2013-05-08 MED ORDER — SIMVASTATIN 80 MG PO TABS: 80.0000 mg | ORAL_TABLET | Freq: Every day | ORAL | Status: DC

## 2013-05-08 NOTE — Telephone Encounter (Signed)
Message copied by Donne Anon on Thu May 08, 2013 11:04 AM ------      Message from: Allayne Butcher      Created: Fri May 02, 2013  7:44 AM       Cont. Simvastatin 80mg .       Make sure she is taking EVERY day.      Recheck fasting lab and OV-come fasting--in 6 months. ------

## 2013-05-08 NOTE — Telephone Encounter (Signed)
Patient aware of lab results and refill Simvastatin to pharmacy.  Pt told next OV in 6 mths.

## 2013-05-16 ENCOUNTER — Ambulatory Visit (HOSPITAL_COMMUNITY): Payer: Self-pay | Admitting: Psychiatry

## 2013-05-26 ENCOUNTER — Emergency Department (HOSPITAL_COMMUNITY)
Admission: EM | Admit: 2013-05-26 | Discharge: 2013-05-26 | Disposition: A | Discharge status: Home / Self Care | Payer: Managed Care, Other (non HMO) | Attending: Emergency Medicine | Admitting: Emergency Medicine

## 2013-05-26 ENCOUNTER — Encounter (HOSPITAL_COMMUNITY): Payer: Self-pay | Admitting: Emergency Medicine

## 2013-05-26 DIAGNOSIS — I10 Essential (primary) hypertension: Secondary | ICD-10-CM | POA: Insufficient documentation

## 2013-05-26 DIAGNOSIS — Y929 Unspecified place or not applicable: Secondary | ICD-10-CM | POA: Insufficient documentation

## 2013-05-26 DIAGNOSIS — F3289 Other specified depressive episodes: Secondary | ICD-10-CM | POA: Insufficient documentation

## 2013-05-26 DIAGNOSIS — T6391XA Toxic effect of contact with unspecified venomous animal, accidental (unintentional), initial encounter: Secondary | ICD-10-CM | POA: Insufficient documentation

## 2013-05-26 DIAGNOSIS — Z87891 Personal history of nicotine dependence: Secondary | ICD-10-CM | POA: Insufficient documentation

## 2013-05-26 DIAGNOSIS — F411 Generalized anxiety disorder: Secondary | ICD-10-CM | POA: Insufficient documentation

## 2013-05-26 DIAGNOSIS — Z8739 Personal history of other diseases of the musculoskeletal system and connective tissue: Secondary | ICD-10-CM | POA: Insufficient documentation

## 2013-05-26 DIAGNOSIS — K219 Gastro-esophageal reflux disease without esophagitis: Secondary | ICD-10-CM | POA: Insufficient documentation

## 2013-05-26 DIAGNOSIS — Z880 Allergy status to penicillin: Secondary | ICD-10-CM | POA: Insufficient documentation

## 2013-05-26 DIAGNOSIS — Y939 Activity, unspecified: Secondary | ICD-10-CM | POA: Insufficient documentation

## 2013-05-26 DIAGNOSIS — Z79899 Other long term (current) drug therapy: Secondary | ICD-10-CM | POA: Insufficient documentation

## 2013-05-26 DIAGNOSIS — T63461A Toxic effect of venom of wasps, accidental (unintentional), initial encounter: Secondary | ICD-10-CM | POA: Insufficient documentation

## 2013-05-26 DIAGNOSIS — F319 Bipolar disorder, unspecified: Secondary | ICD-10-CM | POA: Insufficient documentation

## 2013-05-26 DIAGNOSIS — Z853 Personal history of malignant neoplasm of breast: Secondary | ICD-10-CM | POA: Insufficient documentation

## 2013-05-26 DIAGNOSIS — E785 Hyperlipidemia, unspecified: Secondary | ICD-10-CM | POA: Insufficient documentation

## 2013-05-26 DIAGNOSIS — F329 Major depressive disorder, single episode, unspecified: Secondary | ICD-10-CM | POA: Insufficient documentation

## 2013-05-26 MED ORDER — EPINEPHRINE 0.3 MG/0.3ML IJ SOAJ: 0.3000 mg | Freq: Once | INTRAMUSCULAR | Status: DC

## 2013-05-26 MED ORDER — PREDNISONE 10 MG PO TABS: ORAL_TABLET | ORAL | Status: DC

## 2013-05-26 MED ORDER — FAMOTIDINE 20 MG PO TABS
ORAL_TABLET | ORAL | Status: AC
  Administered 2013-05-26: 20:00:00
  Filled 2013-05-26: qty 1

## 2013-05-26 MED ORDER — METHYLPREDNISOLONE SODIUM SUCC 125 MG IJ SOLR
INTRAMUSCULAR | Status: AC
  Administered 2013-05-26: 20:00:00 via INTRAMUSCULAR
  Filled 2013-05-26: qty 2

## 2013-05-26 NOTE — ED Notes (Signed)
Pt reports bee sting to her right hand ring finger. Has taken 2 benadryl.  Non resp distress.

## 2013-05-26 NOTE — ED Provider Notes (Signed)
CSN: [id number]     Arrival date & time 05/26/13  1814 History   First MD Initiated Contact with Patient 05/26/13 2018     This chart was scribed for non-physician practitioner, Ivery Quale PA-C, working with Joya Gaskins, MD by Arlan Organ, ED Scribe. This patient was seen in room [facility] and the patient's care was started at 7:21 PM.  Chief Complaint  Patient presents with  . Insect Bite   The history is provided by the patient. No language interpreter was used.   HPI Comments: Deborah Mullins is a 62 y.o. female who presents to the Emergency Department complaining of a bee sting on her right 4th finger that occurred today around 5 PM. Pt states she is currently allergic to bees. At the time of the sting, she took 2 benadryl prior to arrival.  She states she feels the stinger is still inside of her skin. She says the last time she was stung was on 02/19/2013, and she was treated here at [facility]. She was given an epi pen, however, pt did not have the money for her refill. Pt denies numbness and weakness.  Past Medical History  Diagnosis Date  . Hypertension   . Cancer     breast   . Hyperlipidemia   . GERD (gastroesophageal reflux disease)   . Allergic rhinitis   . Lung nodule 2009  . PONV (postoperative nausea and vomiting)   . Shortness of breath     exertion  . Headache(784.0)   . Arthritis   . Anxiety   . Bipolar 1 disorder   . Depression    Past Surgical History  Procedure Laterality Date  . Tubal ligation    . Tonsillectomy    . Cesarean section    . Breast surgery  2005    lumpectomy - left  . Lung cancer surgery Right 05/23/2012    reportedly clear   Family History  Problem Relation Age of Onset  . Heart disease Mother     CHF  . Physical abuse Mother   . Depression Mother   . Cancer Sister   . ADD / ADHD Sister   . Alcohol abuse Father   . Alcohol abuse Brother   . Mental illness Brother   . Anxiety disorder Neg Hx   . Bipolar disorder Neg Hx   .  Dementia Neg Hx   . Drug abuse Neg Hx   . Paranoid behavior Neg Hx   . Schizophrenia Neg Hx   . Seizures Neg Hx   . Sexual abuse Neg Hx   . Cancer Sister   . Heart disease Sister   . Physical abuse Sister   . OCD Sister   . Mental illness Sister    History  Substance Use Topics  . Smoking status: Former Smoker -- 1.00 packs/day for 10 years    Types: Cigarettes    Quit date: 08/14/1974  . Smokeless tobacco: Never Used  . Alcohol Use: No   OB History   Grav Para Term Preterm Abortions TAB SAB Ect Mult Living                 Review of Systems  Skin: Positive for wound (bee sting).  Neurological: Negative for weakness and numbness.    Allergies  Bee venom and Penicillins  Home Medications   Current Outpatient Rx  Name  Route  Sig  Dispense  Refill  . amitriptyline (ELAVIL) 25 MG tablet   Oral  Take 25 mg by mouth at bedtime.         . carbamazepine (TEGRETOL XR) 200 MG 12 hr tablet   Oral   Take 1 tablet (200 mg total) by mouth 2 (two) times daily.   180 tablet   0     90 DAY SUPPLY REQUIRED BY INSURANCE WITHOUT WAIVER ...   . dexlansoprazole (DEXILANT) 60 MG capsule   Oral   Take 60 mg by mouth daily.         Marland Kitchen EPINEPHrine (EPI-PEN) 0.3 mg/0.3 mL SOAJ   Intramuscular   Inject 0.3 mLs (0.3 mg total) into the muscle once.   1 Device   3   . gabapentin (NEURONTIN) 400 MG capsule   Oral   Take 400 mg by mouth 2 (two) times daily.         . hydrochlorothiazide (HYDRODIURIL) 25 MG tablet   Oral   Take 1 tablet (25 mg total) by mouth daily.   90 tablet   0   . methocarbamol (ROBAXIN) 750 MG tablet   Oral   Take 1 tablet (750 mg total) by mouth 3 (three) times daily.   270 tablet   0   . polyethylene glycol powder (GLYCOLAX/MIRALAX) powder   Oral   Take 17 g by mouth daily as needed (constipation).   850 g   0   . simvastatin (ZOCOR) 80 MG tablet   Oral   Take 1 tablet (80 mg total) by mouth at bedtime.   90 tablet   1    BP 141/90   Pulse 99  Temp(Src) 97.8 F (36.6 C) (Oral)  Resp 18  Ht 5\' 3"  (1.6 m)  Wt 150 lb (68.04 kg)  BMI 26.58 kg/m2  SpO2 99%  Physical Exam  Constitutional: She is oriented to person, place, and time. She appears well-developed and well-nourished. No distress.  No swelling of lips, tongue, or soft palate   HENT:  Head: Normocephalic.  Eyes: Conjunctivae are normal. Pupils are equal, round, and reactive to light. No scleral icterus.  Neck: Normal range of motion. Neck supple. No thyromegaly present.  No strider over trachea  Cardiovascular: Normal rate and regular rhythm.  Exam reveals no gallop and no friction rub.   No murmur heard. Capillary refill less than 2 seconds  Pulmonary/Chest: Effort normal and breath sounds normal. No respiratory distress. She has no wheezes. She has no rales.  Airway patent  Lungs clear  Abdominal: Soft. Bowel sounds are normal. She exhibits no distension. There is no tenderness. There is no rebound.  Musculoskeletal: Normal range of motion.  Tendon function good   Neurological: She is alert and oriented to person, place, and time.  Skin: Skin is warm and dry. No rash noted.  Redness, swelling, tenderness over base of 4th finger palmer surface  Psychiatric: She has a normal mood and affect. Her behavior is normal.    ED Course  Procedures (including critical care time)  DIAGNOSTIC STUDIES: Oxygen Saturation is 99% on RA, normal by my interpretation.    COORDINATION OF CARE: 7:21 PM-Discussed treatment plan with pt at bedside and pt agreed to plan.  8:25 PM- Repeat evaluation.   Speaking in complete sentences  Playing a game on her cell phone  Lungs clear  Trachea midline  Redness of ring finger improved  No distress     Labs Review Labs Reviewed - No data to display Imaging Review No results found.  EKG Interpretation   None  MDM  No diagnosis found. **I have reviewed nursing notes, vital signs, and all appropriate lab and  imaging results for this patient.* Pt sustained an insect sting to the right ring finger. She was treated with benadryl prior to admission. She was treated with solumedrol and pepcid in ED. Pt was observed in ED with no deterioration of condition. She discharge improved with Stable pulse ox, speaking in complete sentences. Rx for epi-pen, and prednisone taper given to the patient. She will continue allegra and benadryl. She will return immediately if any changes or problem.. **I have reviewed nursing notes, vital signs, and all appropriate lab and imaging results for this patient.   Kathie Dike, PA-C 05/27/13 1215

## 2013-05-26 NOTE — ED Notes (Signed)
Wasp sting to rt ring finger  , redness present.  Minimal swelling.  Ice pack applied .  Pt took benadryl pta

## 2013-05-29 NOTE — ED Provider Notes (Signed)
Medical screening examination/treatment/procedure(s) were performed by non-physician practitioner and as supervising physician I was immediately available for consultation/collaboration.   Joya Gaskins, MD 05/29/13 [phone]

## 2013-05-30 ENCOUNTER — Encounter (HOSPITAL_COMMUNITY): Payer: Self-pay | Admitting: Psychiatry

## 2013-05-30 NOTE — Progress Notes (Signed)
Outpatient Therapist Discharge Summary  Deborah Mullins    January 21, 1951   Admission Date: 02/26/2013   Discharge Date:  05/30/2013  Reason for Discharge: Patient decided to discontinue pursuing treatment at this time Diagnosis:  Axis I:  Major Depressive Disorder   [name],MSW, LCSW

## 2013-06-03 ENCOUNTER — Encounter (HOSPITAL_COMMUNITY): Payer: Self-pay | Admitting: Psychiatry

## 2013-06-17 ENCOUNTER — Encounter (HOSPITAL_COMMUNITY): Payer: Self-pay | Admitting: Psychiatry

## 2013-06-17 ENCOUNTER — Ambulatory Visit (INDEPENDENT_AMBULATORY_CARE_PROVIDER_SITE_OTHER): Payer: 59 | Admitting: Psychiatry

## 2013-06-17 VITALS — BP 130/85 | Ht 63.0 in | Wt 159.0 lb

## 2013-06-17 DIAGNOSIS — F411 Generalized anxiety disorder: Secondary | ICD-10-CM

## 2013-06-17 DIAGNOSIS — F329 Major depressive disorder, single episode, unspecified: Secondary | ICD-10-CM

## 2013-06-17 DIAGNOSIS — F418 Other specified anxiety disorders: Secondary | ICD-10-CM

## 2013-06-17 DIAGNOSIS — F332 Major depressive disorder, recurrent severe without psychotic features: Secondary | ICD-10-CM

## 2013-06-17 MED ORDER — AMITRIPTYLINE HCL 25 MG PO TABS: 25.0000 mg | ORAL_TABLET | Freq: Every day | ORAL | Status: DC

## 2013-06-17 MED ORDER — VENLAFAXINE HCL ER 75 MG PO CP24: 75.0000 mg | ORAL_CAPSULE | Freq: Every day | ORAL | Status: DC

## 2013-06-17 MED ORDER — CARBAMAZEPINE ER 200 MG PO TB12: 200.0000 mg | ORAL_TABLET | Freq: Two times a day (BID) | ORAL | Status: DC
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
## 2013-06-19 ENCOUNTER — Other Ambulatory Visit: Payer: Self-pay

## 2013-06-20 ENCOUNTER — Telehealth (HOSPITAL_COMMUNITY): Payer: Self-pay | Admitting: *Deleted

## 2013-07-18 ENCOUNTER — Ambulatory Visit (INDEPENDENT_AMBULATORY_CARE_PROVIDER_SITE_OTHER): Payer: 59 | Admitting: Psychiatry

## 2013-07-18 ENCOUNTER — Other Ambulatory Visit: Payer: Self-pay | Admitting: Physician Assistant

## 2013-07-18 ENCOUNTER — Encounter (HOSPITAL_COMMUNITY): Payer: Self-pay | Admitting: Psychiatry

## 2013-07-18 VITALS — BP 140/80 | Ht 63.0 in | Wt 158.0 lb

## 2013-07-18 DIAGNOSIS — F411 Generalized anxiety disorder: Secondary | ICD-10-CM

## 2013-07-18 DIAGNOSIS — F418 Other specified anxiety disorders: Secondary | ICD-10-CM

## 2013-07-18 DIAGNOSIS — F329 Major depressive disorder, single episode, unspecified: Secondary | ICD-10-CM

## 2013-07-18 DIAGNOSIS — N6320 Unspecified lump in the left breast, unspecified quadrant: Secondary | ICD-10-CM

## 2013-07-18 DIAGNOSIS — F332 Major depressive disorder, recurrent severe without psychotic features: Secondary | ICD-10-CM

## 2013-07-18 MED ORDER — AMITRIPTYLINE HCL 25 MG PO TABS: 25.0000 mg | ORAL_TABLET | Freq: Every day | ORAL | Status: DC

## 2013-07-18 MED ORDER — VENLAFAXINE HCL ER 75 MG PO CP24: 75.0000 mg | ORAL_CAPSULE | Freq: Every day | ORAL | Status: DC

## 2013-07-18 MED ORDER — CARBAMAZEPINE ER 200 MG PO TB12: 200.0000 mg | ORAL_TABLET | Freq: Two times a day (BID) | ORAL | Status: DC
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
## 2013-08-20 ENCOUNTER — Other Ambulatory Visit: Payer: Self-pay | Admitting: Physician Assistant

## 2013-08-20 ENCOUNTER — Ambulatory Visit (HOSPITAL_COMMUNITY)
Admission: RE | Admit: 2013-08-20 | Discharge: 2013-08-20 | Disposition: A | Discharge status: Home / Self Care | Payer: Managed Care, Other (non HMO) | Source: Ambulatory Visit | Attending: Physician Assistant | Admitting: Physician Assistant

## 2013-08-20 DIAGNOSIS — N6320 Unspecified lump in the left breast, unspecified quadrant: Secondary | ICD-10-CM

## 2013-08-20 DIAGNOSIS — Z853 Personal history of malignant neoplasm of breast: Secondary | ICD-10-CM | POA: Insufficient documentation

## 2013-08-20 DIAGNOSIS — N63 Unspecified lump in unspecified breast: Secondary | ICD-10-CM | POA: Insufficient documentation

## 2013-09-07 ENCOUNTER — Encounter (HOSPITAL_COMMUNITY): Payer: Self-pay | Admitting: Emergency Medicine

## 2013-09-07 ENCOUNTER — Emergency Department (HOSPITAL_COMMUNITY)
Admission: EM | Admit: 2013-09-07 | Discharge: 2013-09-07 | Disposition: A | Discharge status: Home / Self Care | Payer: Managed Care, Other (non HMO) | Attending: Emergency Medicine | Admitting: Emergency Medicine

## 2013-09-07 DIAGNOSIS — Z9109 Other allergy status, other than to drugs and biological substances: Secondary | ICD-10-CM | POA: Insufficient documentation

## 2013-09-07 DIAGNOSIS — Z853 Personal history of malignant neoplasm of breast: Secondary | ICD-10-CM | POA: Insufficient documentation

## 2013-09-07 DIAGNOSIS — Z8739 Personal history of other diseases of the musculoskeletal system and connective tissue: Secondary | ICD-10-CM | POA: Insufficient documentation

## 2013-09-07 DIAGNOSIS — J029 Acute pharyngitis, unspecified: Secondary | ICD-10-CM | POA: Insufficient documentation

## 2013-09-07 DIAGNOSIS — F411 Generalized anxiety disorder: Secondary | ICD-10-CM | POA: Insufficient documentation

## 2013-09-07 DIAGNOSIS — E785 Hyperlipidemia, unspecified: Secondary | ICD-10-CM | POA: Insufficient documentation

## 2013-09-07 DIAGNOSIS — Z87891 Personal history of nicotine dependence: Secondary | ICD-10-CM | POA: Insufficient documentation

## 2013-09-07 DIAGNOSIS — Z880 Allergy status to penicillin: Secondary | ICD-10-CM | POA: Insufficient documentation

## 2013-09-07 DIAGNOSIS — Z79899 Other long term (current) drug therapy: Secondary | ICD-10-CM | POA: Insufficient documentation

## 2013-09-07 DIAGNOSIS — R059 Cough, unspecified: Secondary | ICD-10-CM | POA: Insufficient documentation

## 2013-09-07 DIAGNOSIS — Z9181 History of falling: Secondary | ICD-10-CM | POA: Insufficient documentation

## 2013-09-07 DIAGNOSIS — I10 Essential (primary) hypertension: Secondary | ICD-10-CM | POA: Insufficient documentation

## 2013-09-07 DIAGNOSIS — K219 Gastro-esophageal reflux disease without esophagitis: Secondary | ICD-10-CM | POA: Insufficient documentation

## 2013-09-07 DIAGNOSIS — F319 Bipolar disorder, unspecified: Secondary | ICD-10-CM | POA: Insufficient documentation

## 2013-09-07 DIAGNOSIS — R05 Cough: Secondary | ICD-10-CM | POA: Insufficient documentation

## 2013-09-07 LAB — RAPID STREP SCREEN (MED CTR MEBANE ONLY): STREPTOCOCCUS, GROUP A SCREEN (DIRECT): NEGATIVE

## 2013-09-07 NOTE — ED Notes (Signed)
Pt reports a sore throat that started 4 days ago. Pt states has felt hot but denies fever.

## 2013-09-07 NOTE — Discharge Instructions (Signed)
Pharyngitis °Pharyngitis is redness, pain, and swelling (inflammation) of your pharynx.  °CAUSES  °Pharyngitis is usually caused by infection. Most of the time, these infections are from viruses (viral) and are part of a cold. However, sometimes pharyngitis is caused by bacteria (bacterial). Pharyngitis can also be caused by allergies. Viral pharyngitis may be spread from person to person by coughing, sneezing, and personal items or utensils (cups, forks, spoons, toothbrushes). Bacterial pharyngitis may be spread from person to person by more intimate contact, such as kissing.  °SIGNS AND SYMPTOMS  °Symptoms of pharyngitis include:   °· Sore throat.   °· Tiredness (fatigue).   °· Low-grade fever.   °· Headache. °· Joint pain and muscle aches. °· Skin rashes. °· Swollen lymph nodes. °· Plaque-like film on throat or tonsils (often seen with bacterial pharyngitis). °DIAGNOSIS  °Your health care provider will ask you questions about your illness and your symptoms. Your medical history, along with a physical exam, is often all that is needed to diagnose pharyngitis. Sometimes, a rapid strep test is done. Other lab tests may also be done, depending on the suspected cause.  °TREATMENT  °Viral pharyngitis will usually get better in 3 4 days without the use of medicine. Bacterial pharyngitis is treated with medicines that kill germs (antibiotics).  °HOME CARE INSTRUCTIONS  °· Drink enough water and fluids to keep your urine clear or pale yellow.   °· Only take over-the-counter or prescription medicines as directed by your health care provider:   °· If you are prescribed antibiotics, make sure you finish them even if you start to feel better.   °· Do not take aspirin.   °· Get lots of rest.   °· Gargle with 8 oz of salt water (½ tsp of salt per 1 qt of water) as often as every 1 2 hours to soothe your throat.   °· Throat lozenges (if you are not at risk for choking) or sprays may be used to soothe your throat. °SEEK MEDICAL  CARE IF:  °· You have large, tender lumps in your neck. °· You have a rash. °· You cough up green, yellow-brown, or bloody spit. °SEEK IMMEDIATE MEDICAL CARE IF:  °· Your neck becomes stiff. °· You drool or are unable to swallow liquids. °· You vomit or are unable to keep medicines or liquids down. °· You have severe pain that does not go away with the use of recommended medicines. °· You have trouble breathing (not caused by a stuffy nose). °MAKE SURE YOU:  °· Understand these instructions. °· Will watch your condition. °· Will get help right away if you are not doing well or get worse. °Document Released: 07/31/2005 Document Revised: 05/21/2013 Document Reviewed: 04/07/2013 °[employer]® Patient Information ©2014 [employer]. ° °

## 2013-09-07 NOTE — ED Provider Notes (Signed)
CSN: [id number]     Arrival date & time 09/07/13  5176 History  This chart was scribed for Ezequiel Essex, MD by Roxan Diesel, ED scribe.  This patient was seen in room APA07/APA07 and the patient's care was started at 7:15 AM.   Chief Complaint  Patient presents with  . Sore Throat    The history is provided by the patient. No language interpreter was used.    HPI Comments: Deborah Mullins is a 63 y.o. female who presents to the Emergency Department complaining of a sore throat that has been ongoing for the past 4 days.  Pt reports pain with swallowing and states it feels as if her lymph nodes are swollen.  She also reports associated ear fullness, dry cough, and subjective fever.  She has not checked her temperature.  She denies chest pain, SOB, abdominal pain, nausea or vomiting.  Pt notes that she had a fall last week and hit her head but denies LOC.  She developed a headache after the fall which has since resolved but she still feels a bump on her head.   Past Medical History  Diagnosis Date  . Hypertension   . Cancer     breast   . Hyperlipidemia   . GERD (gastroesophageal reflux disease)   . Allergic rhinitis   . Lung nodule 2009  . PONV (postoperative nausea and vomiting)   . Shortness of breath     exertion  . Headache(784.0)   . Arthritis   . Anxiety   . Bipolar 1 disorder   . Depression     Past Surgical History  Procedure Laterality Date  . Tubal ligation    . Tonsillectomy    . Cesarean section    . Breast surgery  2005    lumpectomy - left  . Lung cancer surgery Right 05/23/2012    reportedly clear    Family History  Problem Relation Age of Onset  . Heart disease Mother     CHF  . Physical abuse Mother   . Depression Mother   . Cancer Sister   . ADD / ADHD Sister   . Alcohol abuse Father   . Alcohol abuse Brother   . Mental illness Brother   . Anxiety disorder Neg Hx   . Bipolar disorder Neg Hx   . Dementia Neg Hx   . Drug abuse Neg Hx    . Paranoid behavior Neg Hx   . Schizophrenia Neg Hx   . Seizures Neg Hx   . Sexual abuse Neg Hx   . Cancer Sister   . Heart disease Sister   . Physical abuse Sister   . OCD Sister   . Mental illness Sister     History  Substance Use Topics  . Smoking status: Former Smoker -- 1.00 packs/day for 10 years    Types: Cigarettes    Quit date: 08/14/1974  . Smokeless tobacco: Never Used  . Alcohol Use: No    OB History   Grav Para Term Preterm Abortions TAB SAB Ect Mult Living                  Review of Systems A complete 10 system review of systems was obtained and all systems are negative except as noted in the HPI and PMH.    Allergies  Bee venom and Penicillins  Home Medications   Current Outpatient Rx  Name  Route  Sig  Dispense  Refill  . amitriptyline (ELAVIL) 25  MG tablet   Oral   Take 1 tablet (25 mg total) by mouth at bedtime.   90 tablet   2   . dexlansoprazole (DEXILANT) 60 MG capsule   Oral   Take 60 mg by mouth daily.         Marland Kitchen EPINEPHrine (EPI-PEN) 0.3 mg/0.3 mL SOAJ   Intramuscular   Inject 0.3 mLs (0.3 mg total) into the muscle once.   1 Device   3   . hydrochlorothiazide (HYDRODIURIL) 25 MG tablet   Oral   Take 1 tablet (25 mg total) by mouth daily.   90 tablet   0   . polyethylene glycol powder (GLYCOLAX/MIRALAX) powder   Oral   Take 17 g by mouth daily as needed (constipation).   850 g   0   . simvastatin (ZOCOR) 80 MG tablet   Oral   Take 1 tablet (80 mg total) by mouth at bedtime.   90 tablet   1   . venlafaxine XR (EFFEXOR XR) 75 MG 24 hr capsule   Oral   Take 1 capsule (75 mg total) by mouth daily.   90 capsule   2   . carbamazepine (TEGRETOL XR) 200 MG 12 hr tablet   Oral   Take 1 tablet (200 mg total) by mouth 2 (two) times daily.   180 tablet   2   . methocarbamol (ROBAXIN) 750 MG tablet   Oral   Take 1 tablet (750 mg total) by mouth 3 (three) times daily.   270 tablet   0    BP 146/97  Pulse 109   Temp(Src) 97.7 F (36.5 C) (Oral)  Resp 20  Ht 5\' 3"  (1.6 m)  Wt 150 lb (68.04 kg)  BMI 26.58 kg/m2  SpO2 100%  Physical Exam  Nursing note and vitals reviewed. Constitutional: She is oriented to person, place, and time. She appears well-developed and well-nourished. No distress.  HENT:  Head: Normocephalic and atraumatic.  Mouth/Throat: Posterior oropharyngeal erythema present.  Erythema in oropharynx, no asymmetry  Eyes: EOM are normal.  Neck: Normal range of motion. Neck supple. No tracheal deviation present.  Anterior lymphadenopathy No meningismus  Cardiovascular: Normal rate and regular rhythm.   No murmur heard. Pulmonary/Chest: Effort normal. No respiratory distress.  Abdominal: Soft. There is no tenderness. There is no rebound and no guarding.  Musculoskeletal: Normal range of motion. She exhibits no edema and no tenderness.  Neurological: She is alert and oriented to person, place, and time. No cranial nerve deficit. She exhibits normal muscle tone. Coordination normal.  Skin: Skin is warm and dry.  Psychiatric: She has a normal mood and affect. Her behavior is normal.    ED Course  Procedures (including critical care time)  DIAGNOSTIC STUDIES: Oxygen Saturation is 100% on room air, normal by my interpretation.    COORDINATION OF CARE: 7:21 AM-Discussed treatment plan which includes strep screen with pt at bedside and pt agreed to plan.     Labs Review Labs Reviewed  RAPID STREP SCREEN  CULTURE, GROUP A STREP   Imaging Review No results found.  EKG Interpretation   None       MDM   1. Pharyngitis    4 day history of sore throat and pain with swallowing. No fever, chest pain, SOB.   Erythema on exam, no asymmetry.  Rapid strep is negative. Patient is tolerating by mouth in the ED. No evidence of peritonsillar abscess.  Supportive care for likely viral pharyngitis. Return  precautions discussed.  BP 121/75  Pulse 81  Temp(Src) 97.9 F (36.6  C) (Oral)  Resp 22  Ht 5\' 3"  (1.6 m)  Wt 150 lb (68.04 kg)  BMI 26.58 kg/m2  SpO2 100%   I personally performed the services described in this documentation, which was scribed in my presence. The recorded information has been reviewed and is accurate.   Ezequiel Essex, MD 09/07/13 [phone]

## 2013-09-09 LAB — CULTURE, GROUP A STREP

## 2013-09-10 ENCOUNTER — Encounter: Payer: Self-pay | Admitting: Physician Assistant

## 2013-09-10 ENCOUNTER — Ambulatory Visit (INDEPENDENT_AMBULATORY_CARE_PROVIDER_SITE_OTHER): Payer: 59 | Admitting: Physician Assistant

## 2013-09-10 ENCOUNTER — Other Ambulatory Visit: Payer: Self-pay | Admitting: Family Medicine

## 2013-09-10 VITALS — BP 124/90 | HR 100 | Temp 97.4°F | Resp 18 | Ht 64.0 in | Wt 154.0 lb

## 2013-09-10 DIAGNOSIS — A499 Bacterial infection, unspecified: Secondary | ICD-10-CM

## 2013-09-10 DIAGNOSIS — E785 Hyperlipidemia, unspecified: Secondary | ICD-10-CM

## 2013-09-10 DIAGNOSIS — B9689 Other specified bacterial agents as the cause of diseases classified elsewhere: Principal | ICD-10-CM

## 2013-09-10 DIAGNOSIS — J988 Other specified respiratory disorders: Secondary | ICD-10-CM

## 2013-09-10 DIAGNOSIS — I10 Essential (primary) hypertension: Secondary | ICD-10-CM

## 2013-09-10 MED ORDER — AZITHROMYCIN 250 MG PO TABS: ORAL_TABLET | ORAL | Status: DC

## 2013-09-10 MED ORDER — HYDROCOD POLST-CHLORPHEN POLST 10-8 MG/5ML PO LQCR: 5.0000 mL | Freq: Two times a day (BID) | ORAL | Status: DC

## 2013-09-10 MED ORDER — HYDROCHLOROTHIAZIDE 25 MG PO TABS: 25.0000 mg | ORAL_TABLET | Freq: Every day | ORAL | Status: DC

## 2013-09-10 MED ORDER — SIMVASTATIN 80 MG PO TABS: 80.0000 mg | ORAL_TABLET | Freq: Every day | ORAL | Status: DC

## 2013-09-10 NOTE — Progress Notes (Signed)
Patient ID: Deborah Mullins MRN: [id number], DOB: [birth year], 63 y.o. Date of Encounter: 09/10/2013, 9:29 AM    Chief Complaint:  Chief Complaint  Patient presents with  . ED follow up     HPI: 63 y.o. year old female went to [facility] ER 09/07/13. She had complaints of sore throat that had been ongoing for the past 4 days. Rapid strep test was negative. They felt that her pharyngitis was viral. Patient states "they didn't give me any medicine."  Today patient reports that she still has a sore throat, which has now been present for 7 days. Has some cough but is unable to get out any phlegm at all. She feels  this is just secondary to drainage in her throat and does not think she has chest congestion. She is getting no sleep secondary to the cough and needs some cough suppressant. Says that her lymph nodes in her neck up and swollen and sore. Yesterday had lots of pressure behind her ears and felt like her head was pounding.     Home Meds: See attached medication section for any medications that were entered at today's visit. The computer does not put those onto this list.The following list is a list of meds entered prior to today's visit.   Current Outpatient Prescriptions on File Prior to Visit  Medication Sig Dispense Refill  . amitriptyline (ELAVIL) 25 MG tablet Take 1 tablet (25 mg total) by mouth at bedtime.  90 tablet  2  . dexlansoprazole (DEXILANT) 60 MG capsule Take 60 mg by mouth daily.      . hydrochlorothiazide (HYDRODIURIL) 25 MG tablet Take 1 tablet (25 mg total) by mouth daily.  90 tablet  0  . methocarbamol (ROBAXIN) 750 MG tablet Take 1 tablet (750 mg total) by mouth 3 (three) times daily.  270 tablet  0  . polyethylene glycol powder (GLYCOLAX/MIRALAX) powder Take 17 g by mouth daily as needed (constipation).  850 g  0  . simvastatin (ZOCOR) 80 MG tablet Take 1 tablet (80 mg total) by mouth at bedtime.  90 tablet  1  . venlafaxine XR (EFFEXOR XR) 75 MG 24 hr capsule  Take 1 capsule (75 mg total) by mouth daily.  90 capsule  2  . carbamazepine (TEGRETOL XR) 200 MG 12 hr tablet Take 1 tablet (200 mg total) by mouth 2 (two) times daily.  180 tablet  2  . EPINEPHrine (EPI-PEN) 0.3 mg/0.3 mL SOAJ Inject 0.3 mLs (0.3 mg total) into the muscle once.  1 Device  3   No current facility-administered medications on file prior to visit.    Allergies:  Allergies  Allergen Reactions  . Bee Venom Anaphylaxis  . Penicillins Rash      Review of Systems: See HPI for pertinent ROS. All other ROS negative.    Physical Exam: Blood pressure 124/90, pulse 100, temperature 97.4 F (36.3 C), temperature source Oral, resp. rate 18, height 5\' 4"  (1.626 m), weight 154 lb (69.854 kg)., Body mass index is 26.42 kg/(m^2). General: WNWD WF. Appears in no acute distress. HEENT: Normocephalic, atraumatic, eyes without discharge, sclera non-icteric, nares are without discharge. Bilateral auditory canals clear, TM's are without perforation, pearly grey and translucent with reflective cone of light bilaterally. Right TM does have an area of bright red inflammation the remainder of the TM is clear. Oral cavity moist, posterior pharynx without exudate,, peritonsillar abscess. There is minimal erythema of the pharynx. No tenderness with percussion of sinuses. Neck:  Supple. No thyromegaly. Tonsillar lymph nodes tender but not enlarged. Lungs: Clear bilaterally to auscultation without wheezes, rales, or rhonchi. Breathing is unlabored. Heart: Regular rhythm. No murmurs, rubs, or gallops. Msk:  Strength and tone normal for age. Extremities/Skin: Warm and dry.  Neuro: Alert and oriented X 3. Moves all extremities spontaneously. Gait is normal. CNII-XII grossly in tact. Psych:  Responds to questions appropriately with a normal affect.     ASSESSMENT AND PLAN:  63 y.o. year old female with  1. Bacterial respiratory infection Pcn Allergy. - azithromycin (ZITHROMAX) 250 MG tablet; Day1:  Take 2 daily. Days 2-5: take 1 daily.  Dispense: 6 tablet; Refill: 0 - chlorpheniramine-HYDROcodone (TUSSIONEX) 10-8 MG/5ML LQCR; Take 5 mLs by mouth 2 (two) times daily.  Dispense: 115 mL; Refill: 0 If symptoms do not resolve within one week after completion of antibiotics and followup   Signed, [address] [place], [place], [facility] 09/10/2013 9:29 AM

## 2013-10-17 ENCOUNTER — Ambulatory Visit (HOSPITAL_COMMUNITY): Payer: Self-pay | Admitting: Psychiatry

## 2013-10-21 ENCOUNTER — Encounter: Payer: Self-pay | Admitting: Family Medicine

## 2013-11-12 ENCOUNTER — Encounter (HOSPITAL_COMMUNITY): Payer: Self-pay | Admitting: Psychiatry

## 2013-11-12 ENCOUNTER — Ambulatory Visit (INDEPENDENT_AMBULATORY_CARE_PROVIDER_SITE_OTHER): Payer: 59 | Admitting: Psychiatry

## 2013-11-12 VITALS — BP 120/90 | Ht 64.0 in | Wt 158.0 lb

## 2013-11-12 DIAGNOSIS — F332 Major depressive disorder, recurrent severe without psychotic features: Secondary | ICD-10-CM

## 2013-11-12 DIAGNOSIS — F329 Major depressive disorder, single episode, unspecified: Secondary | ICD-10-CM

## 2013-11-12 DIAGNOSIS — F32A Depression, unspecified: Secondary | ICD-10-CM

## 2013-11-12 DIAGNOSIS — F419 Anxiety disorder, unspecified: Principal | ICD-10-CM

## 2013-11-12 DIAGNOSIS — F411 Generalized anxiety disorder: Secondary | ICD-10-CM

## 2013-11-12 MED ORDER — AMITRIPTYLINE HCL 25 MG PO TABS: 25.0000 mg | ORAL_TABLET | Freq: Every day | ORAL | Status: DC

## 2013-11-12 MED ORDER — VENLAFAXINE HCL ER 75 MG PO CP24: 75.0000 mg | ORAL_CAPSULE | Freq: Every day | ORAL | Status: DC
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
## 2013-12-25 ENCOUNTER — Telehealth: Payer: Self-pay | Admitting: Physician Assistant

## 2013-12-25 MED ORDER — OMEPRAZOLE 20 MG PO CPDR: 20.0000 mg | DELAYED_RELEASE_CAPSULE | Freq: Every day | ORAL | Status: DC

## 2013-12-25 NOTE — Telephone Encounter (Signed)
Called patient left message, OK for med change per provider.  Rx to pharmacy

## 2013-12-25 NOTE — Telephone Encounter (Signed)
[phone] Pt is wanting to discuss changing her medication dexlansoprazole (DEXILANT) 60 MG capsule

## 2013-12-25 NOTE — Telephone Encounter (Signed)
Okay.Tell  her that we will send prescription for the omeprazole. Tell her to  followup if symptoms not controlled with this. Send prescription for omeprazole 20 mg 1 by mouth daily #30 with 5 refills.

## 2013-12-25 NOTE — Telephone Encounter (Signed)
Says Dexilant is not working.  Wants to change to Omeprazole, said borrowed one from husband and it worked better.

## 2014-01-26 ENCOUNTER — Encounter: Payer: Self-pay | Admitting: Family Medicine

## 2014-01-26 ENCOUNTER — Telehealth: Payer: Self-pay | Admitting: Physician Assistant

## 2014-01-26 DIAGNOSIS — I10 Essential (primary) hypertension: Secondary | ICD-10-CM

## 2014-01-26 MED ORDER — OMEPRAZOLE 20 MG PO CPDR: 20.0000 mg | DELAYED_RELEASE_CAPSULE | Freq: Every day | ORAL | Status: DC

## 2014-01-26 MED ORDER — HYDROCHLOROTHIAZIDE 25 MG PO TABS: 25.0000 mg | ORAL_TABLET | Freq: Every day | ORAL | Status: DC

## 2014-01-26 NOTE — Telephone Encounter (Signed)
Tried to call pt back and was disconnected.  Then could not reach.  I sent her letter, she is due for 6 month follow up.  Needs to be seen if wants medication adjustment

## 2014-01-26 NOTE — Telephone Encounter (Signed)
Patient is calling to increase dosage of her omeprazole to 40 mg if possible, and also hydrochlorothiazide she needs refills on but mg is fine.   [phone]

## 2014-02-10 ENCOUNTER — Encounter: Payer: Self-pay | Admitting: Family Medicine

## 2014-02-10 ENCOUNTER — Ambulatory Visit (INDEPENDENT_AMBULATORY_CARE_PROVIDER_SITE_OTHER): Payer: 59 | Admitting: Family Medicine

## 2014-02-10 VITALS — BP 130/80 | HR 84 | Temp 97.3°F | Resp 18 | Ht 63.0 in | Wt 155.0 lb

## 2014-02-10 DIAGNOSIS — K219 Gastro-esophageal reflux disease without esophagitis: Secondary | ICD-10-CM

## 2014-02-10 DIAGNOSIS — Z85118 Personal history of other malignant neoplasm of bronchus and lung: Secondary | ICD-10-CM

## 2014-02-10 MED ORDER — PANTOPRAZOLE SODIUM 40 MG PO TBEC: 40.0000 mg | DELAYED_RELEASE_TABLET | Freq: Every day | ORAL | Status: DC

## 2014-02-10 NOTE — Progress Notes (Signed)
Subjective:    Patient ID: Deborah Mullins, female    DOB: 1950-11-17, 63 y.o.   MRN: [id number]  HPI Patient is a 63 year old white female who is here today to followup some of her medical problems. She did with chronic acid reflux for months. 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 However due to insurance she had to switch to omeprazole 20 mg by mouth daily.  The omeprazole does not work at all. She's tried elevating the head of her bed without benefit. She denies being on or hematochezia. She denies any weight loss. She denies any fevers or chills. Has reflux has been worse after she eats spicy foods. She denies any signs or symptoms of blockage or stricture.  She also has a history of lung cancer. She underwent a right lower lobe resection for adenocarcinoma of the lung stage I approximately 2 or 3 years ago. She has not had a chest x-ray in in 12 months. She denies any chest pain shortness of breath or dyspnea on exertion. She denies any hemoptysis. Past Medical History  Diagnosis Date  . Hypertension   . Cancer     breast   . Hyperlipidemia   . GERD (gastroesophageal reflux disease)   . Allergic rhinitis   . Lung nodule 2009  . PONV (postoperative nausea and vomiting)   . Shortness of breath     exertion  . Headache(784.0)   . Arthritis   . Anxiety   . Bipolar 1 disorder   . Depression    Current Outpatient Prescriptions on File Prior to Visit  Medication Sig Dispense Refill  . amitriptyline (ELAVIL) 25 MG tablet Take 1 tablet (25 mg total) by mouth at bedtime.  90 tablet  2  . EPINEPHrine (EPI-PEN) 0.3 mg/0.3 mL SOAJ Inject 0.3 mLs (0.3 mg total) into the muscle once.  1 Device  3  . hydrochlorothiazide (HYDRODIURIL) 25 MG tablet Take 1 tablet (25 mg total) by mouth daily.  30 tablet  0  . omeprazole (PRILOSEC) 20 MG capsule Take 1 capsule (20 mg total) by mouth daily.  30 capsule  0  . polyethylene glycol powder (GLYCOLAX/MIRALAX) powder Take 17 g by mouth  daily as needed (constipation).  850 g  0  . simvastatin (ZOCOR) 80 MG tablet Take 1 tablet (80 mg total) by mouth at bedtime.  90 tablet  1  . venlafaxine XR (EFFEXOR XR) 75 MG 24 hr capsule Take 1 capsule (75 mg total) by mouth daily.  90 capsule  2   No current facility-administered medications on file prior to visit.   Allergies  Allergen Reactions  . Bee Venom Anaphylaxis  . Penicillins Rash   History   Social History  . Marital Status: Married    Spouse Name: N/A    Number of Children: 1  . Years of Education: N/A   Occupational History  . retired     Location manager   Social History Main Topics  . Smoking status: Former Smoker -- 1.00 packs/day for 10 years    Types: Cigarettes    Quit date: 08/14/1974  . Smokeless tobacco: Never Used  . Alcohol Use: No  . Drug Use: No  . Sexual Activity: No   Other Topics Concern  . Not on file   Social History Narrative   Married.    Both pt and her husband have bipolar. She says he "Gets moody and leaves for a while" then comes back.    "  Not very supportive."      Review of Systems  All other systems reviewed and are negative.      Objective:   Physical Exam  Vitals reviewed. Constitutional: She is oriented to person, place, and time. She appears well-developed and well-nourished.  Neck: Neck supple. No JVD present. No thyromegaly present.  Cardiovascular: Normal rate, regular rhythm and normal heart sounds.   No murmur heard. Pulmonary/Chest: Effort normal and breath sounds normal. No respiratory distress. She has no wheezes. She has no rales. She exhibits no tenderness.  Abdominal: Soft. Bowel sounds are normal. She exhibits no distension and no mass. There is no tenderness. There is no rebound and no guarding.  Musculoskeletal: She exhibits no edema.  Lymphadenopathy:    She has no cervical adenopathy.  Neurological: She is alert and oriented to person, place, and time. She has normal reflexes. She  displays normal reflexes. No cranial nerve deficit. She exhibits normal muscle tone. Coordination normal.          Assessment & Plan:  1. Gastroesophageal reflux disease without esophagitis Discontinue omeprazole and switch the patient to protonix 40 mg poqday.  Also recommended elevating the head of her bed at night. I recommended avoiding caffeine and alcohol. I recommended avoiding spicy foods. Recommended weight loss. - pantoprazole (PROTONIX) 40 MG tablet; Take 1 tablet (40 mg total) by mouth daily.  Dispense: 90 tablet; Refill: 3  2. History of lung cancer Order surveillance chest x-ray. Pulmonary exam is normal. Her physical exam is normal.  She is asymptomatic. - DG Chest 2 View; Future

## 2014-02-11 ENCOUNTER — Encounter (HOSPITAL_COMMUNITY): Payer: Self-pay | Admitting: Psychiatry

## 2014-02-11 ENCOUNTER — Ambulatory Visit (HOSPITAL_COMMUNITY)
Admission: RE | Admit: 2014-02-11 | Discharge: 2014-02-11 | Disposition: A | Discharge status: Home / Self Care | Payer: 59 | Source: Ambulatory Visit | Attending: Family Medicine | Admitting: Family Medicine

## 2014-02-11 ENCOUNTER — Ambulatory Visit (INDEPENDENT_AMBULATORY_CARE_PROVIDER_SITE_OTHER): Payer: 59 | Admitting: Psychiatry

## 2014-02-11 VITALS — BP 130/84 | Ht 63.0 in | Wt 158.0 lb

## 2014-02-11 DIAGNOSIS — F329 Major depressive disorder, single episode, unspecified: Secondary | ICD-10-CM

## 2014-02-11 DIAGNOSIS — Z85118 Personal history of other malignant neoplasm of bronchus and lung: Secondary | ICD-10-CM

## 2014-02-11 DIAGNOSIS — Z9010 Acquired absence of unspecified breast and nipple: Secondary | ICD-10-CM | POA: Insufficient documentation

## 2014-02-11 DIAGNOSIS — F411 Generalized anxiety disorder: Secondary | ICD-10-CM

## 2014-02-11 DIAGNOSIS — F32A Depression, unspecified: Secondary | ICD-10-CM

## 2014-02-11 DIAGNOSIS — F419 Anxiety disorder, unspecified: Principal | ICD-10-CM

## 2014-02-11 DIAGNOSIS — Z853 Personal history of malignant neoplasm of breast: Secondary | ICD-10-CM | POA: Insufficient documentation

## 2014-02-11 DIAGNOSIS — F332 Major depressive disorder, recurrent severe without psychotic features: Secondary | ICD-10-CM

## 2014-02-11 MED ORDER — VENLAFAXINE HCL ER 75 MG PO CP24: 75.0000 mg | ORAL_CAPSULE | Freq: Every day | ORAL | Status: DC

## 2014-02-11 MED ORDER — AMITRIPTYLINE HCL 25 MG PO TABS: 25.0000 mg | ORAL_TABLET | Freq: Every day | ORAL | Status: DC
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
## 2014-02-25 ENCOUNTER — Other Ambulatory Visit: Payer: Self-pay | Admitting: *Deleted

## 2014-03-21 ENCOUNTER — Other Ambulatory Visit: Payer: Self-pay | Admitting: Physician Assistant

## 2014-04-18 ENCOUNTER — Emergency Department (HOSPITAL_COMMUNITY): Payer: 59

## 2014-04-18 ENCOUNTER — Emergency Department (HOSPITAL_COMMUNITY)
Admission: EM | Admit: 2014-04-18 | Discharge: 2014-04-18 | Disposition: A | Discharge status: Home / Self Care | Payer: 59 | Attending: Emergency Medicine | Admitting: Emergency Medicine

## 2014-04-18 ENCOUNTER — Encounter (HOSPITAL_COMMUNITY): Payer: Self-pay | Admitting: Emergency Medicine

## 2014-04-18 DIAGNOSIS — K219 Gastro-esophageal reflux disease without esophagitis: Secondary | ICD-10-CM | POA: Diagnosis not present

## 2014-04-18 DIAGNOSIS — Z8709 Personal history of other diseases of the respiratory system: Secondary | ICD-10-CM | POA: Diagnosis not present

## 2014-04-18 DIAGNOSIS — Z8739 Personal history of other diseases of the musculoskeletal system and connective tissue: Secondary | ICD-10-CM | POA: Insufficient documentation

## 2014-04-18 DIAGNOSIS — S99929A Unspecified injury of unspecified foot, initial encounter: Secondary | ICD-10-CM

## 2014-04-18 DIAGNOSIS — Z853 Personal history of malignant neoplasm of breast: Secondary | ICD-10-CM | POA: Diagnosis not present

## 2014-04-18 DIAGNOSIS — I10 Essential (primary) hypertension: Secondary | ICD-10-CM | POA: Insufficient documentation

## 2014-04-18 DIAGNOSIS — Y929 Unspecified place or not applicable: Secondary | ICD-10-CM | POA: Insufficient documentation

## 2014-04-18 DIAGNOSIS — F319 Bipolar disorder, unspecified: Secondary | ICD-10-CM | POA: Diagnosis not present

## 2014-04-18 DIAGNOSIS — Z880 Allergy status to penicillin: Secondary | ICD-10-CM | POA: Diagnosis not present

## 2014-04-18 DIAGNOSIS — Y939 Activity, unspecified: Secondary | ICD-10-CM | POA: Insufficient documentation

## 2014-04-18 DIAGNOSIS — R296 Repeated falls: Secondary | ICD-10-CM | POA: Diagnosis not present

## 2014-04-18 DIAGNOSIS — S8990XA Unspecified injury of unspecified lower leg, initial encounter: Secondary | ICD-10-CM | POA: Insufficient documentation

## 2014-04-18 DIAGNOSIS — F411 Generalized anxiety disorder: Secondary | ICD-10-CM | POA: Insufficient documentation

## 2014-04-18 DIAGNOSIS — S92502A Displaced unspecified fracture of left lesser toe(s), initial encounter for closed fracture: Secondary | ICD-10-CM

## 2014-04-18 DIAGNOSIS — S99919A Unspecified injury of unspecified ankle, initial encounter: Secondary | ICD-10-CM | POA: Diagnosis present

## 2014-04-18 DIAGNOSIS — E785 Hyperlipidemia, unspecified: Secondary | ICD-10-CM | POA: Insufficient documentation

## 2014-04-18 DIAGNOSIS — S92919A Unspecified fracture of unspecified toe(s), initial encounter for closed fracture: Secondary | ICD-10-CM | POA: Insufficient documentation

## 2014-04-18 MED ORDER — OXYCODONE-ACETAMINOPHEN 5-325 MG PO TABS
1.0000 | ORAL_TABLET | Freq: Once | ORAL | Status: AC
  Administered 2014-04-18: 1 via ORAL
  Filled 2014-04-18: qty 1

## 2014-04-18 MED ORDER — HYDROCODONE-ACETAMINOPHEN 5-325 MG PO TABS: ORAL_TABLET | ORAL | Status: DC

## 2014-04-18 NOTE — ED Provider Notes (Signed)
Medical screening examination/treatment/procedure(s) were performed by non-physician practitioner and as supervising physician I was immediately available for consultation/collaboration.    [name], MD 04/18/14 [phone]

## 2014-04-18 NOTE — ED Provider Notes (Signed)
CSN: [id number]     Arrival date & time 04/18/14  0913 History   First MD Initiated Contact with Patient 04/18/14 0920     Chief Complaint  Patient presents with  . Foot Injury   Deborah Mullins is a 63 y.o. female who presents to the Emergency Department complaining of left foot pain after a mechanical fall yesterday.    (Consider location/radiation/quality/duration/timing/severity/associated sxs/prior Treatment) Patient is a 63 y.o. female presenting with foot injury.  Foot Injury Location:  Foot and ankle Time since incident:  12 hours Injury: yes   Mechanism of injury: fall   Fall:    Fall occurred:  Tripped   Impact surface:  Hard floor   Point of impact:  Feet   Entrapped after fall: no   Ankle location:  L ankle Foot location:  L foot (lateral foot and base of fourth and fifth toes) Pain details:    Quality:  Aching and throbbing   Radiates to:  Does not radiate   Severity:  Moderate   Onset quality:  Sudden   Timing:  Constant   Progression:  Unchanged Chronicity:  New Dislocation: no   Foreign body present:  No foreign bodies Prior injury to area:  No Relieved by:  Nothing Worsened by:  Bearing weight Ineffective treatments:  Ice and elevation Associated symptoms: decreased ROM and swelling   Associated symptoms: no back pain, no fever, no neck pain, no numbness, no stiffness and no tingling     Past Medical History  Diagnosis Date  . Hypertension   . Cancer     breast   . Hyperlipidemia   . GERD (gastroesophageal reflux disease)   . Allergic rhinitis   . Lung nodule 2009  . PONV (postoperative nausea and vomiting)   . Shortness of breath     exertion  . Headache(784.0)   . Arthritis   . Anxiety   . Bipolar 1 disorder   . Depression    Past Surgical History  Procedure Laterality Date  . Tubal ligation    . Tonsillectomy    . Cesarean section    . Breast surgery  2005    lumpectomy - left  . Lung cancer surgery Right 05/23/2012    reportedly  clear   Family History  Problem Relation Age of Onset  . Heart disease Mother     CHF  . Physical abuse Mother   . Depression Mother   . Cancer Sister   . ADD / ADHD Sister   . Alcohol abuse Father   . Alcohol abuse Brother   . Mental illness Brother   . Anxiety disorder Neg Hx   . Bipolar disorder Neg Hx   . Dementia Neg Hx   . Drug abuse Neg Hx   . Paranoid behavior Neg Hx   . Schizophrenia Neg Hx   . Seizures Neg Hx   . Sexual abuse Neg Hx   . Cancer Sister   . Heart disease Sister   . Physical abuse Sister   . OCD Sister   . Mental illness Sister    History  Substance Use Topics  . Smoking status: Former Smoker -- 1.00 packs/day for 10 years    Types: Cigarettes    Quit date: 08/14/1974  . Smokeless tobacco: Never Used  . Alcohol Use: No   OB History   Grav Para Term Preterm Abortions TAB SAB Ect Mult Living  Review of Systems  Constitutional: Negative for fever and chills.  Genitourinary: Negative for dysuria and difficulty urinating.  Musculoskeletal: Positive for arthralgias and joint swelling. Negative for back pain, neck pain and stiffness.       Left foot pain and swelling  Skin: Negative for color change and wound.  All other systems reviewed and are negative.     Allergies  Bee venom and Penicillins  Home Medications   Prior to Admission medications   Medication Sig Start Date End Date Taking? Authorizing Provider  amitriptyline (ELAVIL) 25 MG tablet Take 1 tablet (25 mg total) by mouth at bedtime. 02/11/14   Levonne Spiller, MD  EPINEPHrine (EPI-PEN) 0.3 mg/0.3 mL SOAJ Inject 0.3 mLs (0.3 mg total) into the muscle once. 02/19/13   Nat Christen, MD  hydrochlorothiazide (HYDRODIURIL) 25 MG tablet Take 1 tablet (25 mg total) by mouth daily. 01/26/14   Lonie Peak Dixon, PA-C  pantoprazole (PROTONIX) 40 MG tablet Take 1 tablet (40 mg total) by mouth daily. 02/10/14   Susy Frizzle, MD  polyethylene glycol powder (GLYCOLAX/MIRALAX) powder Take  17 g by mouth daily as needed (constipation). 02/05/13   Darrol Jump, MD  simvastatin (ZOCOR) 80 MG tablet TAKE 1 TABLET (80 MG TOTAL) BY MOUTH AT BEDTIME. 03/21/14   Orlena Sheldon, PA-C  venlafaxine XR (EFFEXOR XR) 75 MG 24 hr capsule Take 1 capsule (75 mg total) by mouth daily. 02/11/14 02/11/15  Levonne Spiller, MD   There were no vitals taken for this visit. Physical Exam  Nursing note and vitals reviewed. Constitutional: She is oriented to person, place, and time. She appears well-developed and well-nourished. No distress.  HENT:  Head: Normocephalic and atraumatic.  Cardiovascular: Normal rate, regular rhythm, normal heart sounds and intact distal pulses.   Pulmonary/Chest: Effort normal and breath sounds normal.  Musculoskeletal: She exhibits edema and tenderness.       Left foot: She exhibits tenderness, bony tenderness and swelling. She exhibits no crepitus, no deformity and no laceration.       Feet:  ttp of the proximal fourth and fifth toes.  Mild STS and ecchymosis present.  DP pulse is brisk,distal sensation intact.  No bony deformity.  No proximal tenderness or edema.  Compartments of the LLE are soft  Neurological: She is alert and oriented to person, place, and time. She exhibits normal muscle tone. Coordination normal.  Skin: Skin is warm and dry.    ED Course  Procedures (including critical care time) Labs Review Labs Reviewed - No data to display  Imaging Review Dg Ankle Complete Left  04/18/2014   CLINICAL DATA:  Fall, ankle pain  EXAM: LEFT ANKLE COMPLETE - 3+ VIEW  COMPARISON:  Please see report of foot radiographs dictated same date. No prior ankle radiographs.  FINDINGS: There is no evidence of fracture, dislocation, or joint effusion. There is no evidence of arthropathy or other focal bone abnormality. Soft tissues are unremarkable. Mortise view is suboptimally positioned. Minimal plantar calcaneal spurring.  IMPRESSION: Negative.   Electronically Signed   By: Conchita Paris M.D.   On: 04/18/2014 10:12   Dg Foot Complete Left  04/18/2014   CLINICAL DATA:  Fall, foot trauma  EXAM: LEFT FOOT - COMPLETE 3+ VIEW  COMPARISON:  03/26/2012  FINDINGS: There is an oblique fracture with intra-articular extension at the base of the left fifth toe proximal phalanx. No radiopaque foreign body. No soft tissue abnormality. Minimal plantar calcaneal spurring.  IMPRESSION: Oblique fracture at the  base of the proximal phalanx of the left fifth toe.   Electronically Signed   By: Conchita Paris M.D.   On: 04/18/2014 10:11     EKG Interpretation None      MDM   Final diagnoses:  None    Toes buddy taped and post op shoe applied, pain improved.  Remains NV intact.  Pt has own cane.  She agrees to elevate, ice and orthopedic follow-up.  She appears stable for d/c     Hutch Rhett L. Alonnah Lampkins, PA-C 04/18/14 1037

## 2014-04-18 NOTE — Discharge Instructions (Signed)
Buddy Taping of Toes We have taped your toes together to keep them from moving. This is called "buddy taping" since we used a part of your own body to keep the injured part still. We placed soft padding between your toes to keep them from rubbing against each other. Buddy taping will help with healing and to reduce pain. Keep your toes buddy taped together for as long as directed by your caregiver. HOME CARE INSTRUCTIONS   Raise your injured area above the level of your heart while sitting or lying down. Prop it up with pillows.  An ice pack used every twenty minutes, while awake, for the first one to two days may be helpful. Put ice in a plastic bag and put a towel between the bag and your skin.  Watch for signs that the taping is too tight. These signs may be:  Numbness of your taped toes.  Coolness of your taped toes.  Color change in the area beyond the tape.  Increased pain.  If you have any of these signs, loosen or rewrap the tape. If you need to loosen or rewrap the buddy tape, make sure you use the padding again. SEEK IMMEDIATE MEDICAL CARE IF:   You have worse pain, swelling, inflammation (soreness), drainage or bleeding after you rewrap the tape.  Any new problems occur. MAKE SURE YOU:   Understand these instructions.  Will watch your condition.  Will get help right away if you are not doing well or get worse. Document Released: 05/04/2004 Document Revised: 10/23/2011 Document Reviewed: 07/28/2008 [facility] Patient Information 2015 [place], [place]. This information is not intended to replace advice given to you by your health care provider. Make sure you discuss any questions you have with your health care provider.  Toe Fracture A toe fracture is a break in the bone of a toe. It may take 6 to 8 weeks for the toe injury to heal. HOME CARE  "Buddy taping" is taping the broken toe to the toe next to it. Leave the toes taped together for at least 1 week or as told by your  doctor. Change the tape after bathing. Always use a small piece of gauze or cotton between the toes when taping them together.  Put ice on the injured area.  Put ice in a plastic bag.  Place a towel between your skin and the bag.  Leave the ice on for 15-20 minutes, 03-04 times a day.  After the first 2 days, put heat on the injured area. Use heat for the next 2 to 3 days. Put a heating pad on the foot or soak the foot in warm water as told by your doctor.  Keep the foot raised (elevated) above the level of your heart.  Wear sturdy, supportive shoes. The shoes should not pinch the toes or fit tightly against the toes.  Use a cast shoe (if prescribed) if the foot is very puffy (swollen).  Use crutches if you have pain or it hurts too much to walk.  Only take medicine as told by your doctor.  Follow up with your doctor as told. GET HELP RIGHT AWAY IF:   There is pain or puffiness that is not helped by medicine.  The pain does not get better after 1 week.  The toe is cold when the others are warm.  The toe loses feeling (numb) or turns white.  The toe becomes hot and red (inflamed). MAKE SURE YOU:   Understand these instructions.  Will  watch this condition.  Will get help right away if you are not doing well or get worse. Document Released: 01/17/2008 Document Revised: 10/23/2011 Document Reviewed: 12/24/2009 [facility] Patient Information 2015 [place], [place]. This information is not intended to replace advice given to you by your health care provider. Make sure you discuss any questions you have with your health care provider.

## 2014-04-18 NOTE — ED Notes (Signed)
Fell yesterday.  C/o pain to left foot

## 2014-04-24 ENCOUNTER — Other Ambulatory Visit: Payer: Self-pay | Admitting: Physician Assistant

## 2014-04-24 NOTE — Telephone Encounter (Signed)
Refill appropriate and filled per protocol. 

## 2014-05-13 ENCOUNTER — Telehealth: Payer: Self-pay | Admitting: Family Medicine

## 2014-05-13 ENCOUNTER — Encounter: Payer: Self-pay | Admitting: Family Medicine

## 2014-05-13 MED ORDER — SIMVASTATIN 80 MG PO TABS: 80.0000 mg | ORAL_TABLET | Freq: Every day | ORAL | Status: DC

## 2014-05-13 NOTE — Telephone Encounter (Signed)
Medication refill for one time only.  Patient needs to be seen.  Letter sent for patient to call and schedule 

## 2014-05-14 ENCOUNTER — Encounter (HOSPITAL_COMMUNITY): Payer: Self-pay | Admitting: Psychiatry

## 2014-05-14 ENCOUNTER — Ambulatory Visit (HOSPITAL_COMMUNITY): Payer: Self-pay | Admitting: Psychiatry

## 2014-05-15 ENCOUNTER — Telehealth: Payer: Self-pay | Admitting: Physician Assistant

## 2014-05-15 NOTE — Telephone Encounter (Signed)
Patient is calling to ask why we denied her rx that was faxed yesterday for 90 days? Please call her back at [phone]

## 2014-05-18 NOTE — Telephone Encounter (Signed)
NTBS  Letter was sent to her.  Hasn't hasn't routine visit with lab work in over one year.

## 2014-05-20 ENCOUNTER — Encounter (HOSPITAL_COMMUNITY): Payer: Self-pay | Admitting: Psychiatry

## 2014-05-20 ENCOUNTER — Ambulatory Visit (INDEPENDENT_AMBULATORY_CARE_PROVIDER_SITE_OTHER): Payer: 59 | Admitting: Psychiatry

## 2014-05-20 VITALS — BP 136/91 | HR 88 | Ht 64.0 in | Wt 158.6 lb

## 2014-05-20 DIAGNOSIS — F419 Anxiety disorder, unspecified: Principal | ICD-10-CM

## 2014-05-20 DIAGNOSIS — F411 Generalized anxiety disorder: Secondary | ICD-10-CM

## 2014-05-20 DIAGNOSIS — F329 Major depressive disorder, single episode, unspecified: Secondary | ICD-10-CM

## 2014-05-20 DIAGNOSIS — F332 Major depressive disorder, recurrent severe without psychotic features: Secondary | ICD-10-CM

## 2014-05-20 MED ORDER — AMITRIPTYLINE HCL 25 MG PO TABS: 25.0000 mg | ORAL_TABLET | Freq: Every day | ORAL | Status: DC

## 2014-05-20 MED ORDER — FLUOXETINE HCL 20 MG PO CAPS: 20.0000 mg | ORAL_CAPSULE | Freq: Every day | ORAL | Status: DC
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
## 2014-06-10 ENCOUNTER — Other Ambulatory Visit: Payer: Self-pay | Admitting: Physician Assistant

## 2014-06-10 NOTE — Telephone Encounter (Signed)
Refill denied.   Requires office visit before any further refills can be given.   Multiple letters sent with no appointment made for labs.

## 2014-06-11 ENCOUNTER — Other Ambulatory Visit: Payer: 59

## 2014-06-11 DIAGNOSIS — Z0000 Encounter for general adult medical examination without abnormal findings: Secondary | ICD-10-CM

## 2014-06-11 DIAGNOSIS — E559 Vitamin D deficiency, unspecified: Secondary | ICD-10-CM

## 2014-06-11 DIAGNOSIS — E7800 Pure hypercholesterolemia, unspecified: Secondary | ICD-10-CM

## 2014-06-11 LAB — COMPLETE METABOLIC PANEL WITH GFR
ALT: 25 U/L (ref 0–35)
AST: 24 U/L (ref 0–37)
Albumin: 4.5 g/dL (ref 3.5–5.2)
Alkaline Phosphatase: 65 U/L (ref 39–117)
BUN: 15 mg/dL (ref 6–23)
CHLORIDE: 100 meq/L (ref 96–112)
CO2: 29 meq/L (ref 19–32)
Calcium: 9.6 mg/dL (ref 8.4–10.5)
Creat: 0.99 mg/dL (ref 0.50–1.10)
GFR, Est African American: 71 mL/min
GFR, Est Non African American: 61 mL/min
GLUCOSE: 103 mg/dL — AB (ref 70–99)
POTASSIUM: 4 meq/L (ref 3.5–5.3)
SODIUM: 140 meq/L (ref 135–145)
Total Bilirubin: 0.5 mg/dL (ref 0.2–1.2)
Total Protein: 7.1 g/dL (ref 6.0–8.3)

## 2014-06-11 LAB — LIPID PANEL
Cholesterol: 233 mg/dL — ABNORMAL HIGH (ref 0–200)
HDL: 52 mg/dL (ref >39)
LDL Cholesterol: 129 mg/dL — ABNORMAL HIGH (ref 0–99)
Total CHOL/HDL Ratio: 4.5 Ratio
Triglycerides: 260 mg/dL — ABNORMAL HIGH (ref <150)
VLDL: 52 mg/dL — ABNORMAL HIGH (ref 0–40)

## 2014-06-11 LAB — CBC WITH DIFFERENTIAL/PLATELET
Basophils Absolute: 0 10*3/uL (ref 0.0–0.1)
Basophils Relative: 0 % (ref 0–1)
Eosinophils Absolute: 0.1 10*3/uL (ref 0.0–0.7)
Eosinophils Relative: 1 % (ref 0–5)
HEMATOCRIT: 39.8 % (ref 36.0–46.0)
HEMOGLOBIN: 13.6 g/dL (ref 12.0–15.0)
LYMPHS ABS: 2.4 10*3/uL (ref 0.7–4.0)
Lymphocytes Relative: 42 % (ref 12–46)
MCH: 30.4 pg (ref 26.0–34.0)
MCHC: 34.2 g/dL (ref 30.0–36.0)
MCV: 88.8 fL (ref 78.0–100.0)
MONO ABS: 0.6 10*3/uL (ref 0.1–1.0)
Monocytes Relative: 10 % (ref 3–12)
Neutro Abs: 2.6 10*3/uL (ref 1.7–7.7)
Neutrophils Relative %: 47 % (ref 43–77)
PLATELETS: 271 10*3/uL (ref 150–400)
RBC: 4.48 MIL/uL (ref 3.87–5.11)
RDW: 13.3 % (ref 11.5–15.5)
WBC: 5.6 10*3/uL (ref 4.0–10.5)

## 2014-06-12 LAB — VITAMIN D 25 HYDROXY (VIT D DEFICIENCY, FRACTURES): VIT D 25 HYDROXY: 39 ng/mL (ref 30–89)

## 2014-06-16 ENCOUNTER — Encounter: Payer: Self-pay | Admitting: Family Medicine

## 2014-06-16 ENCOUNTER — Ambulatory Visit (INDEPENDENT_AMBULATORY_CARE_PROVIDER_SITE_OTHER): Payer: 59 | Admitting: Family Medicine

## 2014-06-16 VITALS — BP 120/84 | HR 72 | Temp 98.1°F | Resp 18 | Ht 63.0 in | Wt 161.0 lb

## 2014-06-16 DIAGNOSIS — E785 Hyperlipidemia, unspecified: Secondary | ICD-10-CM

## 2014-06-16 DIAGNOSIS — M791 Myalgia, unspecified site: Secondary | ICD-10-CM

## 2014-06-16 DIAGNOSIS — Z23 Encounter for immunization: Secondary | ICD-10-CM

## 2014-06-16 DIAGNOSIS — G2581 Restless legs syndrome: Secondary | ICD-10-CM

## 2014-06-16 DIAGNOSIS — I10 Essential (primary) hypertension: Secondary | ICD-10-CM

## 2014-06-16 DIAGNOSIS — R0609 Other forms of dyspnea: Secondary | ICD-10-CM

## 2014-06-16 MED ORDER — ALBUTEROL SULFATE HFA 108 (90 BASE) MCG/ACT IN AERS: 2.0000 | INHALATION_SPRAY | Freq: Four times a day (QID) | RESPIRATORY_TRACT | Status: DC | PRN

## 2014-06-16 NOTE — Progress Notes (Signed)
Subjective:    Patient ID: Deborah Mullins, female    DOB: Nov 03, 1950, 63 y.o.   MRN: [id number]  HPI Patient is a 63 year old white female who is currently taking simvastatin 80 mg by mouth daily. Her most recent lab work as listed below and is significant for an elevated  LDL cholesterol as well as elevated triglycerides. She is also complaining of diffuse myalgias. She is also having symptoms of restless leg syndrome. At night when she tries to lie down, she describes an uncomfortable feeling in her legs that improves with movement. Her legs also twitching and jumping throughout the evening. The symptoms have been going on for months. She is also having stress incontinence when she laughs. She also reports increasing dyspnea on exertion. She denies any chest pain. She does have a history of lung cancer status post resection. She also has a history of 25-pack-year smoking. She also has a history of asthma. She is not currently on any type of inhaler. Past Medical History  Diagnosis Date  . Hypertension   . Cancer     breast   . Hyperlipidemia   . GERD (gastroesophageal reflux disease)   . Allergic rhinitis   . Lung nodule 2009  . PONV (postoperative nausea and vomiting)   . Shortness of breath     exertion  . Headache(784.0)   . Arthritis   . Anxiety   . Bipolar 1 disorder   . Depression    Past Surgical History  Procedure Laterality Date  . Tubal ligation    . Tonsillectomy    . Cesarean section    . Breast surgery  2005    lumpectomy - left  . Lung cancer surgery Right 05/23/2012    reportedly clear   Current Outpatient Prescriptions on File Prior to Visit  Medication Sig Dispense Refill  . amitriptyline (ELAVIL) 25 MG tablet Take 1 tablet (25 mg total) by mouth at bedtime. 90 tablet 2  . FLUoxetine (PROZAC) 20 MG capsule Take 1 capsule (20 mg total) by mouth daily. 90 capsule 2  . hydrochlorothiazide (HYDRODIURIL) 25 MG tablet TAKE 1 TABLET (25 MG TOTAL) BY MOUTH DAILY. 90  tablet 1  . pantoprazole (PROTONIX) 40 MG tablet Take 1 tablet (40 mg total) by mouth daily. 90 tablet 3  . polyethylene glycol powder (GLYCOLAX/MIRALAX) powder Take 17 g by mouth daily as needed (constipation). 850 g 0  . simvastatin (ZOCOR) 80 MG tablet Take 1 tablet (80 mg total) by mouth at bedtime. 30 tablet 0   No current facility-administered medications on file prior to visit.   Allergies  Allergen Reactions  . Bee Venom Anaphylaxis  . Penicillins Rash   History   Social History  . Marital Status: Married    Spouse Name: N/A    Number of Children: 1  . Years of Education: N/A   Occupational History  . retired     Location manager   Social History Main Topics  . Smoking status: Former Smoker -- 1.00 packs/day for 10 years    Types: Cigarettes    Quit date: 08/14/1974  . Smokeless tobacco: Never Used  . Alcohol Use: No  . Drug Use: No  . Sexual Activity: No   Other Topics Concern  . Not on file   Social History Narrative   Married.    Both pt and her husband have bipolar. She says he "Gets moody and leaves for a while" then comes back.    "Not very  supportive."   Lab on 06/11/2014  Component Date Value Ref Range Status  . Vit D, 25-Hydroxy 06/11/2014 39  30 - 89 ng/mL Final   Comment: This assay accurately quantifies Vitamin D, which is the sum of the                          25-Hydroxy forms of Vitamin D2 and D3.  Studies have shown that the                          optimum concentration of 25-Hydroxy Vitamin D is 30 ng/mL or higher.                           Concentrations of Vitamin D between 20 and 29 ng/mL are considered to                          be insufficient and concentrations less than 20 ng/mL are considered                          to be deficient for Vitamin D.  . Sodium 06/11/2014 140  135 - 145 mEq/L Final  . Potassium 06/11/2014 4.0  3.5 - 5.3 mEq/L Final  . Chloride 06/11/2014 100  96 - 112 mEq/L Final  . CO2 06/11/2014 29  19 - 32  mEq/L Final  . Glucose, Bld 06/11/2014 103* 70 - 99 mg/dL Final  . BUN 06/11/2014 15  6 - 23 mg/dL Final  . Creat 06/11/2014 0.99  0.50 - 1.10 mg/dL Final  . Total Bilirubin 06/11/2014 0.5  0.2 - 1.2 mg/dL Final  . Alkaline Phosphatase 06/11/2014 65  39 - 117 U/L Final  . AST 06/11/2014 24  0 - 37 U/L Final  . ALT 06/11/2014 25  0 - 35 U/L Final  . Total Protein 06/11/2014 7.1  6.0 - 8.3 g/dL Final  . Albumin 06/11/2014 4.5  3.5 - 5.2 g/dL Final  . Calcium 06/11/2014 9.6  8.4 - 10.5 mg/dL Final  . GFR, Est African American 06/11/2014 71   Final  . GFR, Est Non African American 06/11/2014 61   Final   Comment:                            The estimated GFR is a calculation valid for adults (>=67 years old)                          that uses the CKD-EPI algorithm to adjust for age and sex. It is                            not to be used for children, pregnant women, hospitalized patients,                             patients on dialysis, or with rapidly changing kidney function.                          According to the NKDEP, eGFR >89 is normal, 60-89 shows mild  impairment, 30-59 shows moderate impairment, 15-29 shows severe                          impairment and <15 is ESRD.                             . WBC 06/11/2014 5.6  4.0 - 10.5 K/uL Final  . RBC 06/11/2014 4.48  3.87 - 5.11 MIL/uL Final  . Hemoglobin 06/11/2014 13.6  12.0 - 15.0 g/dL Final  . HCT 06/11/2014 39.8  36.0 - 46.0 % Final  . MCV 06/11/2014 88.8  78.0 - 100.0 fL Final  . MCH 06/11/2014 30.4  26.0 - 34.0 pg Final  . MCHC 06/11/2014 34.2  30.0 - 36.0 g/dL Final  . RDW 06/11/2014 13.3  11.5 - 15.5 % Final  . Platelets 06/11/2014 271  150 - 400 K/uL Final  . Neutrophils Relative % 06/11/2014 47  43 - 77 % Final  . Neutro Abs 06/11/2014 2.6  1.7 - 7.7 K/uL Final  . Lymphocytes Relative 06/11/2014 42  12 - 46 % Final  . Lymphs Abs 06/11/2014 2.4  0.7 - 4.0 K/uL Final  . Monocytes Relative  06/11/2014 10  3 - 12 % Final  . Monocytes Absolute 06/11/2014 0.6  0.1 - 1.0 K/uL Final  . Eosinophils Relative 06/11/2014 1  0 - 5 % Final  . Eosinophils Absolute 06/11/2014 0.1  0.0 - 0.7 K/uL Final  . Basophils Relative 06/11/2014 0  0 - 1 % Final  . Basophils Absolute 06/11/2014 0.0  0.0 - 0.1 K/uL Final  . Smear Review 06/11/2014 Criteria for review not met   Final  . Cholesterol 06/11/2014 233* 0 - 200 mg/dL Final   Comment: ATP III Classification:                                < 200        mg/dL        Desirable                               200 - 239     mg/dL        Borderline High                               >= 240        mg/dL        High                             . Triglycerides 06/11/2014 260* <150 mg/dL Final  . HDL 06/11/2014 52  >39 mg/dL Final  . Total CHOL/HDL Ratio 06/11/2014 4.5   Final  . VLDL 06/11/2014 52* 0 - 40 mg/dL Final  . LDL Cholesterol 06/11/2014 129* 0 - 99 mg/dL Final   Comment:                            Total Cholesterol/HDL Ratio:CHD Risk  Coronary Heart Disease Risk Table                                                                 Men       Women                                   1/2 Average Risk              3.4        3.3                                       Average Risk              5.0        4.4                                    2X Average Risk              9.6        7.1                                    3X Average Risk             23.4       11.0                          Use the calculated Patient Ratio above and the CHD Risk table                           to determine the patient's CHD Risk.                          ATP III Classification (LDL):                                < 100        mg/dL         Optimal                               100 - 129     mg/dL         Near or Above Optimal                               130 - 159     mg/dL         Borderline High                                160 - 189     mg/dL  High                                > 190        mg/dL         Very High                                 Review of Systems  All other systems reviewed and are negative.      Objective:   Physical Exam  Constitutional: She appears well-developed and well-nourished. No distress.  Cardiovascular: Normal rate, regular rhythm and normal heart sounds.   No murmur heard. Pulmonary/Chest: Effort normal and breath sounds normal.  Abdominal: Soft. Bowel sounds are normal. She exhibits no distension. There is no tenderness. There is no rebound and no guarding.  Musculoskeletal: She exhibits no edema or tenderness.  Skin: She is not diaphoretic.  Vitals reviewed.         Assessment & Plan:  Essential hypertension  HLD (hyperlipidemia)  Dyspnea on exertion  Myalgia  RLS (restless legs syndrome)  Patient's blood pressure is well controlled. Her cholesterol is not ideal. Furthermore I believe the high-dose simvastatin 80 mg a day is causing her myalgias. Therefore I have asked her to discontinue simvastatin and replaced with Crestor 20 mg. I would like to check her fasting lipid panel back in weeks on the new medication and see if her myalgias improve. If the myalgias improved but the symptoms of restless leg syndrome persist, another treatment option would be requip.  She received a flu shot today. I recommended [place] exercises for stress incontinence.  Patient's spirometry shows an FEV1 of 1.8 L and an FVC of 2.47 L. This gives her an FEV1 percent of 73. While abnormal, her FEV1 is 84% of predicted which at best would be mild stage I COPD. I believe her symptoms are more likely asthma related. Therefore I recommended albuterol 2 puffs inhaled every 6 hours as needed for wheezing or shortness of breath.

## 2014-07-17 ENCOUNTER — Ambulatory Visit (INDEPENDENT_AMBULATORY_CARE_PROVIDER_SITE_OTHER): Payer: 59 | Admitting: Psychiatry

## 2014-07-17 ENCOUNTER — Encounter (HOSPITAL_COMMUNITY): Payer: Self-pay | Admitting: Psychiatry

## 2014-07-17 VITALS — BP 110/80 | HR 85 | Ht 63.0 in | Wt 159.0 lb

## 2014-07-17 DIAGNOSIS — F411 Generalized anxiety disorder: Secondary | ICD-10-CM

## 2014-07-17 DIAGNOSIS — F332 Major depressive disorder, recurrent severe without psychotic features: Secondary | ICD-10-CM

## 2014-07-17 MED ORDER — FLUOXETINE HCL 40 MG PO CAPS: 40.0000 mg | ORAL_CAPSULE | Freq: Every day | ORAL | Status: DC

## 2014-07-17 MED ORDER — AMITRIPTYLINE HCL 25 MG PO TABS: 25.0000 mg | ORAL_TABLET | Freq: Every day | ORAL | Status: DC
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
## 2014-07-21 ENCOUNTER — Telehealth: Payer: Self-pay | Admitting: Family Medicine

## 2014-07-21 NOTE — Telephone Encounter (Signed)
PT is needing a refill on rosuvastatin (CRESTOR) 20 MG tablet [phone] [id number] - [place], [place] [address]

## 2014-07-22 NOTE — Telephone Encounter (Signed)
Pt recently put on Crestor due to problems with Atrovastatin.  Provider wanted to recheck blood work in 6 weeks.  Pt called (LMTCB)  Needs to come have this done before we refill long term.

## 2014-07-25 ENCOUNTER — Other Ambulatory Visit: Payer: Self-pay | Admitting: Family Medicine

## 2014-07-27 NOTE — Telephone Encounter (Signed)
Medication refilled per protocol. 

## 2014-08-03 ENCOUNTER — Encounter: Payer: Self-pay | Admitting: Family Medicine

## 2014-08-03 NOTE — Telephone Encounter (Signed)
Pt not responding to phone messages.  Sent letter, need to repeat liver/lipids to refill Crestor.

## 2014-08-10 ENCOUNTER — Other Ambulatory Visit: Payer: 59

## 2014-08-10 DIAGNOSIS — E785 Hyperlipidemia, unspecified: Secondary | ICD-10-CM

## 2014-08-10 DIAGNOSIS — Z79899 Other long term (current) drug therapy: Secondary | ICD-10-CM

## 2014-08-10 LAB — LIPID PANEL
CHOL/HDL RATIO: 7.2 ratio
Cholesterol: 322 mg/dL — ABNORMAL HIGH (ref 0–200)
HDL: 45 mg/dL (ref >39)
LDL Cholesterol: 200 mg/dL — ABNORMAL HIGH (ref 0–99)
TRIGLYCERIDES: 386 mg/dL — AB (ref <150)
VLDL: 77 mg/dL — ABNORMAL HIGH (ref 0–40)

## 2014-08-10 LAB — HEPATIC FUNCTION PANEL
ALT: 14 U/L (ref 0–35)
AST: 18 U/L (ref 0–37)
Albumin: 4.6 g/dL (ref 3.5–5.2)
Alkaline Phosphatase: 79 U/L (ref 39–117)
Bilirubin, Direct: 0.1 mg/dL (ref 0.0–0.3)
Indirect Bilirubin: 0.3 mg/dL (ref 0.2–1.2)
Total Bilirubin: 0.4 mg/dL (ref 0.2–1.2)
Total Protein: 7.3 g/dL (ref 6.0–8.3)

## 2014-08-14 ENCOUNTER — Emergency Department (HOSPITAL_COMMUNITY)
Admission: EM | Admit: 2014-08-14 | Discharge: 2014-08-14 | Disposition: A | Discharge status: Home / Self Care | Payer: 59 | Attending: Emergency Medicine | Admitting: Emergency Medicine

## 2014-08-14 ENCOUNTER — Emergency Department (HOSPITAL_COMMUNITY): Payer: 59

## 2014-08-14 ENCOUNTER — Encounter (HOSPITAL_COMMUNITY): Payer: Self-pay | Admitting: Emergency Medicine

## 2014-08-14 ENCOUNTER — Other Ambulatory Visit: Payer: Self-pay | Admitting: Physician Assistant

## 2014-08-14 DIAGNOSIS — I10 Essential (primary) hypertension: Secondary | ICD-10-CM | POA: Insufficient documentation

## 2014-08-14 DIAGNOSIS — Z79899 Other long term (current) drug therapy: Secondary | ICD-10-CM | POA: Insufficient documentation

## 2014-08-14 DIAGNOSIS — M1990 Unspecified osteoarthritis, unspecified site: Secondary | ICD-10-CM | POA: Insufficient documentation

## 2014-08-14 DIAGNOSIS — R51 Headache: Secondary | ICD-10-CM | POA: Diagnosis not present

## 2014-08-14 DIAGNOSIS — I951 Orthostatic hypotension: Secondary | ICD-10-CM

## 2014-08-14 DIAGNOSIS — Z87891 Personal history of nicotine dependence: Secondary | ICD-10-CM | POA: Diagnosis not present

## 2014-08-14 DIAGNOSIS — Z853 Personal history of malignant neoplasm of breast: Secondary | ICD-10-CM | POA: Insufficient documentation

## 2014-08-14 DIAGNOSIS — R42 Dizziness and giddiness: Secondary | ICD-10-CM

## 2014-08-14 DIAGNOSIS — K219 Gastro-esophageal reflux disease without esophagitis: Secondary | ICD-10-CM | POA: Diagnosis not present

## 2014-08-14 DIAGNOSIS — Z880 Allergy status to penicillin: Secondary | ICD-10-CM | POA: Insufficient documentation

## 2014-08-14 DIAGNOSIS — R079 Chest pain, unspecified: Secondary | ICD-10-CM

## 2014-08-14 DIAGNOSIS — Z8739 Personal history of other diseases of the musculoskeletal system and connective tissue: Secondary | ICD-10-CM | POA: Diagnosis not present

## 2014-08-14 DIAGNOSIS — E785 Hyperlipidemia, unspecified: Secondary | ICD-10-CM | POA: Diagnosis not present

## 2014-08-14 LAB — RAPID URINE DRUG SCREEN, HOSP PERFORMED
AMPHETAMINES: NOT DETECTED
Barbiturates: NOT DETECTED
Benzodiazepines: NOT DETECTED
COCAINE: NOT DETECTED
OPIATES: NOT DETECTED
Tetrahydrocannabinol: POSITIVE — AB

## 2014-08-14 LAB — CBC WITH DIFFERENTIAL/PLATELET
BASOS ABS: 0 10*3/uL (ref 0.0–0.1)
BASOS PCT: 0 % (ref 0–1)
EOS PCT: 1 % (ref 0–5)
Eosinophils Absolute: 0 10*3/uL (ref 0.0–0.7)
HEMATOCRIT: 42 % (ref 36.0–46.0)
Hemoglobin: 13.4 g/dL (ref 12.0–15.0)
Lymphocytes Relative: 32 % (ref 12–46)
Lymphs Abs: 1.7 10*3/uL (ref 0.7–4.0)
MCH: 29.5 pg (ref 26.0–34.0)
MCHC: 31.9 g/dL (ref 30.0–36.0)
MCV: 92.3 fL (ref 78.0–100.0)
MONO ABS: 0.4 10*3/uL (ref 0.1–1.0)
Monocytes Relative: 9 % (ref 3–12)
NEUTROS ABS: 3 10*3/uL (ref 1.7–7.7)
Neutrophils Relative %: 58 % (ref 43–77)
Platelets: 256 10*3/uL (ref 150–400)
RBC: 4.55 MIL/uL (ref 3.87–5.11)
RDW: 13 % (ref 11.5–15.5)
WBC: 5.1 10*3/uL (ref 4.0–10.5)

## 2014-08-14 LAB — TROPONIN I

## 2014-08-14 LAB — URINALYSIS, ROUTINE W REFLEX MICROSCOPIC
Bilirubin Urine: NEGATIVE
Glucose, UA: NEGATIVE mg/dL
HGB URINE DIPSTICK: NEGATIVE
Ketones, ur: NEGATIVE mg/dL
Leukocytes, UA: NEGATIVE
Nitrite: NEGATIVE
PROTEIN: NEGATIVE mg/dL
Specific Gravity, Urine: 1.01 (ref 1.005–1.030)
Urobilinogen, UA: 0.2 mg/dL (ref 0.0–1.0)
pH: 6.5 (ref 5.0–8.0)

## 2014-08-14 LAB — COMPREHENSIVE METABOLIC PANEL
ALBUMIN: 4.5 g/dL (ref 3.5–5.2)
ALT: 20 U/L (ref 0–35)
AST: 22 U/L (ref 0–37)
Alkaline Phosphatase: 69 U/L (ref 39–117)
Anion gap: 7 (ref 5–15)
BUN: 15 mg/dL (ref 6–23)
CALCIUM: 9.4 mg/dL (ref 8.4–10.5)
CO2: 30 mmol/L (ref 19–32)
CREATININE: 0.86 mg/dL (ref 0.50–1.10)
Chloride: 101 mEq/L (ref 96–112)
GFR calc Af Amer: 82 mL/min — ABNORMAL LOW (ref >90)
GFR calc non Af Amer: 71 mL/min — ABNORMAL LOW (ref >90)
Glucose, Bld: 126 mg/dL — ABNORMAL HIGH (ref 70–99)
Potassium: 3.6 mmol/L (ref 3.5–5.1)
Sodium: 138 mmol/L (ref 135–145)
TOTAL PROTEIN: 7.7 g/dL (ref 6.0–8.3)
Total Bilirubin: 0.7 mg/dL (ref 0.3–1.2)

## 2014-08-14 MED ORDER — SODIUM CHLORIDE 0.9 % IV BOLUS (SEPSIS)
1000.0000 mL | Freq: Once | INTRAVENOUS | Status: AC
  Administered 2014-08-14: 1000 mL via INTRAVENOUS

## 2014-08-14 MED ORDER — NITROGLYCERIN 0.4 MG SL SUBL
0.4000 mg | SUBLINGUAL_TABLET | Freq: Once | SUBLINGUAL | Status: AC
  Administered 2014-08-14: 0.4 mg via SUBLINGUAL
  Filled 2014-08-14: qty 1

## 2014-08-14 MED ORDER — ASPIRIN 81 MG PO CHEW
324.0000 mg | CHEWABLE_TABLET | Freq: Once | ORAL | Status: AC
  Administered 2014-08-14: 324 mg via ORAL
  Filled 2014-08-14: qty 4

## 2014-08-14 MED ORDER — ONDANSETRON HCL 4 MG/2ML IJ SOLN
4.0000 mg | Freq: Once | INTRAMUSCULAR | Status: AC
  Administered 2014-08-14: 4 mg via INTRAVENOUS
  Filled 2014-08-14: qty 2

## 2014-08-14 NOTE — Discharge Instructions (Signed)
Orthostatic Hypotension Keep yourself hydrated. There is no evidence of a heart attack. Follow-up with her doctor. Return to ED if you  develop new or worsening symptoms. Orthostatic hypotension is a sudden drop in blood pressure. It happens when you quickly stand up from a seated or lying position. You may feel dizzy or light-headed. This can last for just a few seconds or for up to a few minutes. It is usually not a serious problem. However, if this happens frequently or gets worse, it can be a sign of something more serious. CAUSES  Different things can cause orthostatic hypotension, including:   Loss of body fluids (dehydration).  Medicines that lower blood pressure.  Sudden changes in posture, such as standing up quickly after you have been sitting or lying down.  Taking too much of your medicine. SIGNS AND SYMPTOMS   Light-headedness or dizziness.   Fainting or near-fainting.   A fast heart rate.   Weakness.   Feeling tired (fatigue).  DIAGNOSIS  Your health care provider may do several things to help diagnose your condition and identify the cause. These may include:   Taking a medical history and doing a physical exam.  Checking your blood pressure. Your health care provider will check your blood pressure when you are:  Lying down.  Sitting.  Standing.  Using tilt table testing. In this test, you lie down on a table that moves from a lying position to a standing position. You will be strapped onto the table. This test monitors your blood pressure and heart rate when you are in different positions. TREATMENT  Treatment will vary depending on the cause. Possible treatments include:   Changing the dosage of your medicines.  Wearing compression stockings on your lower legs.  Standing up slowly after sitting or lying down.  Eating more salt.  Eating frequent, small meals.  In some cases, getting IV fluids.  Taking medicine to enhance fluid retention. HOME  CARE INSTRUCTIONS  Only take over-the-counter or prescription medicines as directed by your health care provider.  Follow your health care provider's instructions for changing the dosage of your current medicines.  Do not stop or adjust your medicine on your own.  Stand up slowly after sitting or lying down. This allows your body to adjust to the different position.  Wear compression stockings as directed.  Eat extra salt as directed.  Do not add extra salt to your diet unless directed to by your health care provider.  Eat frequent, small meals.  Avoid standing suddenly after eating.  Avoid hot showers or excessive heat as directed by your health care provider.  Keep all follow-up appointments. SEEK MEDICAL CARE IF:  You continue to feel dizzy or light-headed after standing.  You feel groggy or confused.  You feel cold, clammy, or sick to your stomach (nauseous).  You have blurred vision.  You feel short of breath. SEEK IMMEDIATE MEDICAL CARE IF:   You faint after standing.  You have chest pain.  You have difficulty breathing.   You lose feeling or movement in your arms or legs.   You have slurred speech or difficulty talking, or you are unable to talk.  MAKE SURE YOU:   Understand these instructions.  Will watch your condition.  Will get help right away if you are not doing well or get worse. Document Released: 07/21/2002 Document Revised: 08/05/2013 Document Reviewed: 05/23/2013 [facility] Patient Information 2015 [place], [place]. This information is not intended to replace advice given to you  by your health care provider. Make sure you discuss any questions you have with your health care provider. ° °

## 2014-08-14 NOTE — ED Notes (Signed)
Pt c/o intermittent tightness to chest with radiaton down arms x 1 month. Had an episode 2 days ago. States came in this am due to sudden dizziness. States started having left sided chest tightness radiating down left arm and had sweating. Pt arrived in NAD, nondiaphoretic and color wnl. No resp distress or sob noted.

## 2014-08-14 NOTE — ED Notes (Signed)
Despite ambulating WNL, patient has requested bedpan x3. RN declined, encouraged continued ambulation.

## 2014-08-14 NOTE — ED Notes (Signed)
Patient ambulatory to bathroom without assistance.

## 2014-08-14 NOTE — ED Provider Notes (Signed)
No all grossly CSN: [id number]     Arrival date & time 08/14/14  1127 History   This chart was scribed for Ezequiel Essex, MD by Stephania Fragmin, ED Scribe. This patient was seen in room APA04/AP[facility]04 and the patient's care was started at 11:49 AM.   Chief Complaint  Patient presents with  . Chest Pain  . Dizziness   The history is provided by the patient. No language interpreter was used.     HPI Comments: Deborah Mullins is a 64 y.o. female with a history of hypertension, hyperlipidemia, lung cancer, breast cancer, and depression who presents to the Emergency Department complaining of sudden onset episodes of vertigo and chest tightness that occurred this morning shortly after she woke up, "horsed around" with her husband on the bed (denies intercourse), and suddenly felt dizzy for several minutes. After it stopped, she drank coffee and sat down, and felt dizzy again. She states she feels 75% better now, with her vertigo and chest tightness mostly resolved, although she is still slightly lightheaded, nauseous, and has a slight headache. This is a new problem, although several days ago, she woke up from her sleep with a sharp chest pain that lasted a short time. Laying down exacerbates her vertigo. Her PCP Dr. Dennard Schaumann recently changed her hyperlipidemia medication to Crestor, which she took for a month. She stopped the Crestor for 1 month because she couldn't afford it. Her last stress test 3-4 years ago returned negative. Patient also has a history of a partial lobectomy of lower right lobe, and breast cancer that has gone into remission. She denies back pain, visual disturbances, and abdominal pain. Patient states she smoked marijuana last night; she went to bed feeling well.    Past Medical History  Diagnosis Date  . Hypertension   . Cancer     breast   . Hyperlipidemia   . GERD (gastroesophageal reflux disease)   . Allergic rhinitis   . Lung nodule 2009  . PONV (postoperative nausea and vomiting)    . Shortness of breath     exertion  . Headache(784.0)   . Arthritis   . Anxiety   . Bipolar 1 disorder   . Depression    Past Surgical History  Procedure Laterality Date  . Tubal ligation    . Tonsillectomy    . Cesarean section    . Breast surgery  2005    lumpectomy - left  . Lung cancer surgery Right 05/23/2012    reportedly clear   Family History  Problem Relation Age of Onset  . Heart disease Mother     CHF  . Physical abuse Mother   . Depression Mother   . Cancer Sister   . ADD / ADHD Sister   . Alcohol abuse Father   . Alcohol abuse Brother   . Mental illness Brother   . Anxiety disorder Neg Hx   . Bipolar disorder Neg Hx   . Dementia Neg Hx   . Drug abuse Neg Hx   . Paranoid behavior Neg Hx   . Schizophrenia Neg Hx   . Seizures Neg Hx   . Sexual abuse Neg Hx   . Cancer Sister   . Heart disease Sister   . Physical abuse Sister   . OCD Sister   . Mental illness Sister    History  Substance Use Topics  . Smoking status: Former Smoker -- 1.00 packs/day for 10 years    Types: Cigarettes    Quit  date: 08/14/1974  . Smokeless tobacco: Never Used  . Alcohol Use: No   OB History    No data available     Review of Systems  A complete 10 system review of systems was obtained and all systems are negative except as noted in the HPI and PMH.    Allergies  Bee venom and Penicillins  Home Medications   Prior to Admission medications   Medication Sig Start Date End Date Taking? Authorizing Provider  amitriptyline (ELAVIL) 25 MG tablet Take 1 tablet (25 mg total) by mouth at bedtime. 07/17/14  Yes Levonne Spiller, MD  FLUoxetine (PROZAC) 40 MG capsule Take 1 capsule (40 mg total) by mouth daily. 07/17/14 07/17/15 Yes Levonne Spiller, MD  hydrochlorothiazide (HYDRODIURIL) 25 MG tablet TAKE 1 TABLET (25 MG TOTAL) BY MOUTH DAILY. 07/27/14  Yes Susy Frizzle, MD  pantoprazole (PROTONIX) 40 MG tablet Take 1 tablet (40 mg total) by mouth daily. 02/10/14  Yes Susy Frizzle, MD  rosuvastatin (CRESTOR) 20 MG tablet Take 20 mg by mouth daily.   Yes Historical Provider, MD  albuterol (PROVENTIL HFA;VENTOLIN HFA) 108 (90 BASE) MCG/ACT inhaler Inhale 2 puffs into the lungs every 6 (six) hours as needed for wheezing or shortness of breath. 06/16/14   Susy Frizzle, MD  polyethylene glycol powder (GLYCOLAX/MIRALAX) powder Take 17 g by mouth daily as needed (constipation). 02/05/13   Darrol Jump, MD  simvastatin (ZOCOR) 80 MG tablet Take 1 tablet (80 mg total) by mouth at bedtime. Patient not taking: Reported on 08/14/2014 05/13/14   Orlena Sheldon, PA-C   BP 140/98 mmHg  Pulse 68  Temp(Src) 97.8 F (36.6 C) (Oral)  Resp 20  Ht 5\' 3"  (1.6 m)  Wt 159 lb (72.122 kg)  BMI 28.17 kg/m2  SpO2 98% Physical Exam  Constitutional: She is oriented to person, place, and time. She appears well-developed and well-nourished. No distress.  HENT:  Head: Normocephalic and atraumatic.  Mouth/Throat: Oropharynx is clear and moist. No oropharyngeal exudate.  Eyes: Conjunctivae and EOM are normal. Pupils are equal, round, and reactive to light.  Neck: Normal range of motion. Neck supple.  No meningismus.  Cardiovascular: Normal rate, regular rhythm, normal heart sounds and intact distal pulses.   No murmur heard. Pulmonary/Chest: Effort normal and breath sounds normal. No respiratory distress. She exhibits tenderness.  Chest wall is tender.  Abdominal: Soft. There is no tenderness. There is no rebound and no guarding.  Musculoskeletal: Normal range of motion. She exhibits no edema or tenderness.  Neurological: She is alert and oriented to person, place, and time. No cranial nerve deficit. She exhibits normal muscle tone. Coordination normal.  No ataxia on finger to nose bilaterally. No pronator drift. 5/5 strength throughout. CN 2-12 intact. Negative Romberg. Equal grip strength. Sensation intact. Gait is normal. No nystagmus. Test of Skew negative. Head Impulse Test negative.   Skin: Skin is warm.  Psychiatric: She has a normal mood and affect. Her behavior is normal.  Nursing note and vitals reviewed.   ED Course  Procedures (including critical care time)  DIAGNOSTIC STUDIES: Oxygen Saturation is 96% on room air, normal by my interpretation.    COORDINATION OF CARE: 11:59 AM - Discussed treatment plan with pt at bedside which includes blood tests and pt agreed to plan.   Labs Review Labs Reviewed  COMPREHENSIVE METABOLIC PANEL - Abnormal; Notable for the following:    Glucose, Bld 126 (*)    GFR calc non Af Wyvonnia Lora  71 (*)    GFR calc Af Amer 82 (*)    All other components within normal limits  URINE RAPID DRUG SCREEN (HOSP PERFORMED) - Abnormal; Notable for the following:    Tetrahydrocannabinol POSITIVE (*)    All other components within normal limits  TROPONIN I  CBC WITH DIFFERENTIAL  URINALYSIS, ROUTINE W REFLEX MICROSCOPIC  TROPONIN I    Imaging Review Dg Chest 2 View  08/14/2014   CLINICAL DATA:  Intermittent tightness of the chest radiating to the arms for 1 month.  EXAM: CHEST  2 VIEW  COMPARISON:  February 11, 2014  FINDINGS: The heart size and mediastinal contours are within normal limits. There is no focal infiltrate, pulmonary edema, or pleural effusion. There is chronic elevation of right hemidiaphragm unchanged. The visualized skeletal structures are stable. Degenerative joint changes of the spine are noted.  IMPRESSION: No active cardiopulmonary disease.   Electronically Signed   By: Abelardo Diesel M.D.   On: 08/14/2014 12:47   Ct Head Wo Contrast  08/14/2014   CLINICAL DATA:  Dizziness today. History of breast cancer and lung cancer.  EXAM: CT HEAD WITHOUT CONTRAST  TECHNIQUE: Contiguous axial images were obtained from the base of the skull through the vertex without intravenous contrast.  COMPARISON:  Head CT December 04, 2007  FINDINGS: There is no midline shift, hydrocephalus, or mass. No acute hemorrhage or acute transcortical infarct is  identified. The bony calvarium is intact. The visualized sinuses are clear.  IMPRESSION: No focal acute intracranial abnormality identified. No abnormal mass or mass effect is noted.   Electronically Signed   By: Abelardo Diesel M.D.   On: 08/14/2014 12:59     EKG Interpretation   Date/Time:  Friday August 14 2014 11:44:44 EST Ventricular Rate:  67 PR Interval:  170 QRS Duration: 88 QT Interval:  405 QTC Calculation: 427 R Axis:   18 Text Interpretation:  Sinus rhythm Low voltage, precordial leads No  significant change was found Confirmed by Wyvonnia Dusky  MD, Maisey Deandrade [phone]) on  08/14/2014 12:15:51 PM      MDM   Final diagnoses:  Chest pain  Dizziness   With episode of dizziness described as vertigo that onset while she was "horse playing" with her husband. Symptoms better with laying down and worse with standing up. She had an episode of chest tightness that has been constant since this episode.  Patient in no distress. Neuro exam is normal. No nystagmus. No ataxia. She has reproducible chest wall pain. EKG normal sinus rhythm.  Orthostatics positive.  HR from 60 to 90s withstanding.  CT head normal. Dizziness has resolved.  Given IV and PO fluids. UDS +THC which she admits to.  Troponin negative x 2.. Symptoms resolved.  Suspect orthostasis in combination with THC abuse.  Counseled to stop marijuana.  Chest pain atypical for ACS. No hypoxia or tachycardia to suggest PE.  EKG unchanged and troponin negative x2. Needs outpatient follow up with cardiology for stress test. Return precautions discussed.  BP 123/94 mmHg  Pulse 72  Temp(Src) 97.8 F (36.6 C) (Oral)  Resp 25  Ht 5\' 3"  (1.6 m)  Wt 159 lb (72.122 kg)  BMI 28.17 kg/m2  SpO2 97%   I personally performed the services described in this documentation, which was scribed in my presence. The recorded information has been reviewed and is accurate.   Ezequiel Essex, MD 08/14/14 [phone]

## 2014-08-14 NOTE — ED Notes (Signed)
Patient has walked to the bathroom twice without assistance.

## 2014-08-18 ENCOUNTER — Encounter: Payer: Self-pay | Admitting: Family Medicine

## 2014-08-18 ENCOUNTER — Ambulatory Visit (INDEPENDENT_AMBULATORY_CARE_PROVIDER_SITE_OTHER): Payer: 59 | Admitting: Family Medicine

## 2014-08-18 VITALS — BP 132/80 | HR 88 | Temp 97.8°F | Resp 18 | Wt 157.0 lb

## 2014-08-18 DIAGNOSIS — Z09 Encounter for follow-up examination after completed treatment for conditions other than malignant neoplasm: Secondary | ICD-10-CM

## 2014-08-18 DIAGNOSIS — R61 Generalized hyperhidrosis: Secondary | ICD-10-CM

## 2014-08-18 DIAGNOSIS — R739 Hyperglycemia, unspecified: Secondary | ICD-10-CM

## 2014-08-18 LAB — CBC WITH DIFFERENTIAL/PLATELET
BASOS PCT: 0 % (ref 0–1)
Basophils Absolute: 0 10*3/uL (ref 0.0–0.1)
Eosinophils Absolute: 0.1 10*3/uL (ref 0.0–0.7)
Eosinophils Relative: 1 % (ref 0–5)
HCT: 40.3 % (ref 36.0–46.0)
Hemoglobin: 13.6 g/dL (ref 12.0–15.0)
LYMPHS ABS: 2.4 10*3/uL (ref 0.7–4.0)
LYMPHS PCT: 40 % (ref 12–46)
MCH: 29.6 pg (ref 26.0–34.0)
MCHC: 33.7 g/dL (ref 30.0–36.0)
MCV: 87.6 fL (ref 78.0–100.0)
MONO ABS: 0.6 10*3/uL (ref 0.1–1.0)
MONOS PCT: 10 % (ref 3–12)
MPV: 10.2 fL (ref 8.6–12.4)
NEUTROS PCT: 49 % (ref 43–77)
Neutro Abs: 2.9 10*3/uL (ref 1.7–7.7)
Platelets: 283 10*3/uL (ref 150–400)
RBC: 4.6 MIL/uL (ref 3.87–5.11)
RDW: 13.5 % (ref 11.5–15.5)
WBC: 5.9 10*3/uL (ref 4.0–10.5)

## 2014-08-18 LAB — HEMOGLOBIN A1C
Hgb A1c MFr Bld: 6 % — ABNORMAL HIGH (ref <5.7)
Mean Plasma Glucose: 126 mg/dL — ABNORMAL HIGH (ref <117)

## 2014-08-18 LAB — TSH: TSH: 1.343 u[IU]/mL (ref 0.350–4.500)

## 2014-08-18 MED ORDER — ROSUVASTATIN CALCIUM 20 MG PO TABS: 20.0000 mg | ORAL_TABLET | Freq: Every day | ORAL | Status: DC

## 2014-08-18 NOTE — Telephone Encounter (Signed)
Simvastatin denied  Pt changed to Crestor

## 2014-08-18 NOTE — Progress Notes (Signed)
Subjective:    Patient ID: Deborah Mullins, female    DOB: [birth year], 64 y.o.   MRN: [id number]  HPI Patient has a history of breast cancer and lung cancer. On New Year's Eve, the patient smoked marijuana. She awoke the following morning and while she was "horsing around" with her husband she developed the sudden onset of vertigo. She became very scared tympanic. Then she developed some tightness in her chest became diaphoretic and experienced a hot flash. She went to the emergency room with her EKG was normal. 2 sets of troponins were normal. Her chest x-ray was normal. Her fasting blood sugar was slightly elevated at 126. I reviewed her hospital records. She's had no further symptoms of vertigo since that time. She is also had no further symptoms of chest pain. She denies any angina. She denies any shortness of breath or dyspnea on exertion. It sounds as if the patient may have had a panic attack stemming from the vertigo. The patient underwent a full cardiac workup 3 years ago prior to her lung surgery with no evidence of ischemia. She does have risk factors including uncontrolled hyperlipidemia. Her most recent LDL cholesterol was 200 but this was off Crestor. Of note she does complain of daily hot flashes and night sweats. This is only recently begun over the last few months. Past Medical History  Diagnosis Date  . Hypertension   . Cancer     breast   . Hyperlipidemia   . GERD (gastroesophageal reflux disease)   . Allergic rhinitis   . Lung nodule 2009  . PONV (postoperative nausea and vomiting)   . Shortness of breath     exertion  . Headache(784.0)   . Arthritis   . Anxiety   . Bipolar 1 disorder   . Depression    Past Surgical History  Procedure Laterality Date  . Tubal ligation    . Tonsillectomy    . Cesarean section    . Breast surgery  2005    lumpectomy - left  . Lung cancer surgery Right 05/23/2012    reportedly clear   Current Outpatient Prescriptions on File Prior  to Visit  Medication Sig Dispense Refill  . albuterol (PROVENTIL HFA;VENTOLIN HFA) 108 (90 BASE) MCG/ACT inhaler Inhale 2 puffs into the lungs every 6 (six) hours as needed for wheezing or shortness of breath. 3 Inhaler 1  . amitriptyline (ELAVIL) 25 MG tablet Take 1 tablet (25 mg total) by mouth at bedtime. 90 tablet 2  . FLUoxetine (PROZAC) 40 MG capsule Take 1 capsule (40 mg total) by mouth daily. 30 capsule 2  . hydrochlorothiazide (HYDRODIURIL) 25 MG tablet TAKE 1 TABLET (25 MG TOTAL) BY MOUTH DAILY. 90 tablet 1  . pantoprazole (PROTONIX) 40 MG tablet Take 1 tablet (40 mg total) by mouth daily. 90 tablet 3  . polyethylene glycol powder (GLYCOLAX/MIRALAX) powder Take 17 g by mouth daily as needed (constipation). 850 g 0  . rosuvastatin (CRESTOR) 20 MG tablet Take 20 mg by mouth daily.     No current facility-administered medications on file prior to visit.   Allergies  Allergen Reactions  . Bee Venom Anaphylaxis  . Penicillins Rash   History   Social History  . Marital Status: Married    Spouse Name: N/A    Number of Children: 1  . Years of Education: N/A   Occupational History  . retired     Location manager   Social History Main Topics  . Smoking  status: Former Smoker -- 1.00 packs/day for 10 years    Types: Cigarettes    Quit date: 08/14/1974  . Smokeless tobacco: Never Used  . Alcohol Use: No  . Drug Use: Yes    Special: Marijuana  . Sexual Activity: No   Other Topics Concern  . Not on file   Social History Narrative   Married.    Both pt and her husband have bipolar. She says he "Gets moody and leaves for a while" then comes back.    "Not very supportive."      Review of Systems  All other systems reviewed and are negative.      Objective:   Physical Exam  Constitutional: She appears well-developed and well-nourished. No distress.  HENT:  Mouth/Throat: Oropharynx is clear and moist.  Eyes: Conjunctivae are normal.  Neck: Neck supple. No  thyromegaly present.  Cardiovascular: Normal rate, regular rhythm and normal heart sounds.   No murmur heard. Pulmonary/Chest: Effort normal and breath sounds normal. No respiratory distress. She has no rales.  Abdominal: Soft. Bowel sounds are normal. She exhibits no distension. There is no tenderness. There is no rebound.  Lymphadenopathy:    She has no cervical adenopathy.    She has no axillary adenopathy.       Right: No inguinal and no supraclavicular adenopathy present.       Left: No inguinal and no supraclavicular adenopathy present.  Vitals reviewed.         Assessment & Plan:  Hyperglycemia - Plan: Hemoglobin A1c  Night sweats - Plan: CBC with Differential, Sedimentation rate, TSH  Hospital discharge follow-up  I believe the patient experienced vertigo. I believe she then developed a panic attack which explained her other symptoms. I do not believe her symptoms are cardiac in nature. They may have been influenced by her drug use. At the present time I do not feel that she needs a cardiac workup. She develops recurrent chest pain or shortness of breath I would recommend a cardiology evaluation for a stress test. I will follow-up on her hyperglycemia by checking hemoglobin A1c to rule out diabetes. Also recommended that she start Crestor 20 mg by mouth daily for her hyperlipidemia. One residual symptomatic concerned about hot flashes and night sweats particularly given her history of cancer. Therefore I will check a CBC as well as a sedimentation rate and also a TSH. If there is an elevated sedimentation rate, I would recommend an evaluation to assess for lymphadenopathy throughout the body by possibly obtaining a CT scan of the chest and abdomen.

## 2014-08-19 LAB — SEDIMENTATION RATE: SED RATE: 15 mm/h (ref 0–22)

## 2014-08-20 ENCOUNTER — Encounter: Payer: Self-pay | Admitting: *Deleted

## 2014-08-25 ENCOUNTER — Other Ambulatory Visit: Payer: Self-pay | Admitting: Family Medicine

## 2014-08-25 DIAGNOSIS — Z1231 Encounter for screening mammogram for malignant neoplasm of breast: Secondary | ICD-10-CM

## 2014-08-27 ENCOUNTER — Telehealth: Payer: Self-pay | Admitting: Family Medicine

## 2014-08-27 MED ORDER — ROSUVASTATIN CALCIUM 40 MG PO TABS: 40.0000 mg | ORAL_TABLET | Freq: Every day | ORAL | Status: DC

## 2014-08-27 NOTE — Telephone Encounter (Signed)
Pt made aware of increase in Crestor to 40 mg.  Has made 3 mth appt.

## 2014-08-27 NOTE — Telephone Encounter (Signed)
-----   Message from Susy Frizzle, MD sent at 08/27/2014  7:02 AM EST ----- Increase crestor to 40 poqday and recheck in 3 months.

## 2014-08-31 ENCOUNTER — Ambulatory Visit (HOSPITAL_COMMUNITY)
Admission: RE | Admit: 2014-08-31 | Discharge: 2014-08-31 | Disposition: A | Discharge status: Home / Self Care | Payer: 59 | Source: Ambulatory Visit | Attending: Family Medicine | Admitting: Family Medicine

## 2014-08-31 DIAGNOSIS — Z1231 Encounter for screening mammogram for malignant neoplasm of breast: Secondary | ICD-10-CM | POA: Diagnosis present

## 2014-08-31 DIAGNOSIS — Z853 Personal history of malignant neoplasm of breast: Secondary | ICD-10-CM | POA: Diagnosis not present

## 2014-09-11 ENCOUNTER — Telehealth (HOSPITAL_COMMUNITY): Payer: Self-pay | Admitting: *Deleted

## 2014-09-11 NOTE — Telephone Encounter (Signed)
Pt pharmacy requesting 90 day supply for pt Fluoxetine due to her insurance only covering 90 days supply. Pt f/u appt is scheduled for 09-30-14.

## 2014-09-14 ENCOUNTER — Other Ambulatory Visit (HOSPITAL_COMMUNITY): Payer: Self-pay | Admitting: *Deleted

## 2014-09-14 MED ORDER — FLUOXETINE HCL 40 MG PO CAPS: 40.0000 mg | ORAL_CAPSULE | Freq: Every day | ORAL | Status: DC

## 2014-09-14 NOTE — Telephone Encounter (Signed)
Medication sent.

## 2014-09-14 NOTE — Telephone Encounter (Signed)
Please send in 90 say supply

## 2014-09-14 NOTE — Telephone Encounter (Signed)
Per Dr. Harrington Challenger to go ahead and send in pt medication to pharmacy

## 2014-09-30 ENCOUNTER — Ambulatory Visit (INDEPENDENT_AMBULATORY_CARE_PROVIDER_SITE_OTHER): Payer: 59 | Admitting: Psychiatry

## 2014-09-30 ENCOUNTER — Encounter (HOSPITAL_COMMUNITY): Payer: Self-pay | Admitting: Psychiatry

## 2014-09-30 VITALS — BP 144/92 | HR 86 | Ht 63.0 in | Wt 154.3 lb

## 2014-09-30 DIAGNOSIS — F329 Major depressive disorder, single episode, unspecified: Secondary | ICD-10-CM

## 2014-09-30 DIAGNOSIS — F411 Generalized anxiety disorder: Secondary | ICD-10-CM

## 2014-09-30 DIAGNOSIS — F419 Anxiety disorder, unspecified: Principal | ICD-10-CM

## 2014-09-30 DIAGNOSIS — F332 Major depressive disorder, recurrent severe without psychotic features: Secondary | ICD-10-CM

## 2014-09-30 MED ORDER — FLUOXETINE HCL 40 MG PO CAPS: 40.0000 mg | ORAL_CAPSULE | Freq: Every day | ORAL | Status: DC

## 2014-09-30 MED ORDER — AMITRIPTYLINE HCL 25 MG PO TABS: 25.0000 mg | ORAL_TABLET | Freq: Every day | ORAL | Status: DC
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
## 2014-11-30 ENCOUNTER — Encounter: Payer: Self-pay | Admitting: Family Medicine

## 2014-11-30 ENCOUNTER — Other Ambulatory Visit: Payer: Self-pay | Admitting: Family Medicine

## 2014-11-30 ENCOUNTER — Emergency Department (HOSPITAL_COMMUNITY)
Admission: EM | Admit: 2014-11-30 | Discharge: 2014-11-30 | Disposition: A | Discharge status: Home / Self Care | Payer: 59 | Attending: Emergency Medicine | Admitting: Emergency Medicine

## 2014-11-30 ENCOUNTER — Encounter (HOSPITAL_COMMUNITY): Payer: Self-pay | Admitting: *Deleted

## 2014-11-30 ENCOUNTER — Ambulatory Visit (INDEPENDENT_AMBULATORY_CARE_PROVIDER_SITE_OTHER): Payer: 59 | Admitting: Family Medicine

## 2014-11-30 ENCOUNTER — Emergency Department (HOSPITAL_COMMUNITY): Payer: 59

## 2014-11-30 VITALS — BP 110/70 | HR 80 | Temp 97.6°F | Resp 16 | Ht 63.0 in | Wt 154.0 lb

## 2014-11-30 DIAGNOSIS — I10 Essential (primary) hypertension: Secondary | ICD-10-CM | POA: Diagnosis not present

## 2014-11-30 DIAGNOSIS — W208XXA Other cause of strike by thrown, projected or falling object, initial encounter: Secondary | ICD-10-CM | POA: Insufficient documentation

## 2014-11-30 DIAGNOSIS — Z8709 Personal history of other diseases of the respiratory system: Secondary | ICD-10-CM | POA: Insufficient documentation

## 2014-11-30 DIAGNOSIS — Y998 Other external cause status: Secondary | ICD-10-CM | POA: Insufficient documentation

## 2014-11-30 DIAGNOSIS — K219 Gastro-esophageal reflux disease without esophagitis: Secondary | ICD-10-CM | POA: Diagnosis not present

## 2014-11-30 DIAGNOSIS — S9031XA Contusion of right foot, initial encounter: Secondary | ICD-10-CM | POA: Diagnosis not present

## 2014-11-30 DIAGNOSIS — Z853 Personal history of malignant neoplasm of breast: Secondary | ICD-10-CM | POA: Diagnosis not present

## 2014-11-30 DIAGNOSIS — Z87891 Personal history of nicotine dependence: Secondary | ICD-10-CM | POA: Diagnosis not present

## 2014-11-30 DIAGNOSIS — Z880 Allergy status to penicillin: Secondary | ICD-10-CM | POA: Diagnosis not present

## 2014-11-30 DIAGNOSIS — Y9389 Activity, other specified: Secondary | ICD-10-CM | POA: Insufficient documentation

## 2014-11-30 DIAGNOSIS — S99921A Unspecified injury of right foot, initial encounter: Secondary | ICD-10-CM | POA: Diagnosis present

## 2014-11-30 DIAGNOSIS — R739 Hyperglycemia, unspecified: Secondary | ICD-10-CM | POA: Diagnosis not present

## 2014-11-30 DIAGNOSIS — Y9289 Other specified places as the place of occurrence of the external cause: Secondary | ICD-10-CM | POA: Diagnosis not present

## 2014-11-30 DIAGNOSIS — Z8739 Personal history of other diseases of the musculoskeletal system and connective tissue: Secondary | ICD-10-CM | POA: Insufficient documentation

## 2014-11-30 DIAGNOSIS — E785 Hyperlipidemia, unspecified: Secondary | ICD-10-CM | POA: Diagnosis not present

## 2014-11-30 DIAGNOSIS — F329 Major depressive disorder, single episode, unspecified: Secondary | ICD-10-CM | POA: Diagnosis not present

## 2014-11-30 DIAGNOSIS — F419 Anxiety disorder, unspecified: Secondary | ICD-10-CM | POA: Insufficient documentation

## 2014-11-30 DIAGNOSIS — Z85118 Personal history of other malignant neoplasm of bronchus and lung: Secondary | ICD-10-CM | POA: Insufficient documentation

## 2014-11-30 HISTORY — DX: Malignant neoplasm of unspecified part of unspecified bronchus or lung: C34.90

## 2014-11-30 LAB — LIPID PANEL
Cholesterol: 171 mg/dL (ref 0–200)
HDL: 71 mg/dL (ref >=46)
LDL CALC: 76 mg/dL (ref 0–99)
Total CHOL/HDL Ratio: 2.4 Ratio
Triglycerides: 119 mg/dL (ref <150)
VLDL: 24 mg/dL (ref 0–40)

## 2014-11-30 LAB — COMPLETE METABOLIC PANEL WITH GFR
ALT: 21 U/L (ref 0–35)
AST: 22 U/L (ref 0–37)
Albumin: 4.6 g/dL (ref 3.5–5.2)
Alkaline Phosphatase: 72 U/L (ref 39–117)
BUN: 19 mg/dL (ref 6–23)
CALCIUM: 9.8 mg/dL (ref 8.4–10.5)
CO2: 32 meq/L (ref 19–32)
CREATININE: 0.96 mg/dL (ref 0.50–1.10)
Chloride: 99 mEq/L (ref 96–112)
GFR, Est African American: 73 mL/min
GFR, Est Non African American: 63 mL/min
Glucose, Bld: 87 mg/dL (ref 70–99)
Potassium: 4 mEq/L (ref 3.5–5.3)
Sodium: 140 mEq/L (ref 135–145)
Total Bilirubin: 0.5 mg/dL (ref 0.2–1.2)
Total Protein: 7.4 g/dL (ref 6.0–8.3)

## 2014-11-30 LAB — CBC WITH DIFFERENTIAL/PLATELET
BASOS PCT: 1 % (ref 0–1)
Basophils Absolute: 0.1 10*3/uL (ref 0.0–0.1)
EOS ABS: 0.2 10*3/uL (ref 0.0–0.7)
Eosinophils Relative: 3 % (ref 0–5)
HEMATOCRIT: 40 % (ref 36.0–46.0)
Hemoglobin: 13 g/dL (ref 12.0–15.0)
Lymphocytes Relative: 30 % (ref 12–46)
Lymphs Abs: 2.3 10*3/uL (ref 0.7–4.0)
MCH: 29.1 pg (ref 26.0–34.0)
MCHC: 32.5 g/dL (ref 30.0–36.0)
MCV: 89.7 fL (ref 78.0–100.0)
MONOS PCT: 12 % (ref 3–12)
MPV: 10.3 fL (ref 8.6–12.4)
Monocytes Absolute: 0.9 10*3/uL (ref 0.1–1.0)
NEUTROS ABS: 4.2 10*3/uL (ref 1.7–7.7)
NEUTROS PCT: 54 % (ref 43–77)
Platelets: 264 10*3/uL (ref 150–400)
RBC: 4.46 MIL/uL (ref 3.87–5.11)
RDW: 13.5 % (ref 11.5–15.5)
WBC: 7.7 10*3/uL (ref 4.0–10.5)

## 2014-11-30 LAB — HEMOGLOBIN A1C
Hgb A1c MFr Bld: 6.2 % — ABNORMAL HIGH (ref <5.7)
Mean Plasma Glucose: 131 mg/dL — ABNORMAL HIGH (ref <117)

## 2014-11-30 MED ORDER — HYDROCODONE-ACETAMINOPHEN 5-325 MG PO TABS
2.0000 | ORAL_TABLET | Freq: Once | ORAL | Status: AC
Start: 2014-11-30 — End: 2014-11-30
  Administered 2014-11-30: 2 via ORAL
  Filled 2014-11-30: qty 2

## 2014-11-30 MED ORDER — IBUPROFEN 800 MG PO TABS: 800.0000 mg | ORAL_TABLET | Freq: Three times a day (TID) | ORAL | Status: DC

## 2014-11-30 MED ORDER — IBUPROFEN 800 MG PO TABS
800.0000 mg | ORAL_TABLET | Freq: Once | ORAL | Status: AC
  Administered 2014-11-30: 800 mg via ORAL
  Filled 2014-11-30: qty 1

## 2014-11-30 MED ORDER — HYDROCODONE-ACETAMINOPHEN 5-325 MG PO TABS: ORAL_TABLET | ORAL | Status: DC

## 2014-11-30 MED ORDER — EPINEPHRINE 0.3 MG/0.3ML IJ SOAJ: 0.3000 mg | Freq: Once | INTRAMUSCULAR | Status: DC

## 2014-11-30 NOTE — ED Provider Notes (Signed)
CSN: [id number]     Arrival date & time 11/30/14  1954 History   First MD Initiated Contact with Patient 11/30/14 2142     Chief Complaint  Patient presents with  . Foot Pain     (Consider location/radiation/quality/duration/timing/severity/associated sxs/prior Treatment) Patient is a 64 y.o. female presenting with lower extremity pain. The history is provided by the patient.  Foot Pain This is a new problem. The current episode started today. The problem occurs intermittently. The problem has been gradually worsening. Associated symptoms include arthralgias. Pertinent negatives include no numbness. The symptoms are aggravated by standing and walking. She has tried nothing for the symptoms. The treatment provided no relief.    Past Medical History  Diagnosis Date  . Hypertension   . Hyperlipidemia   . GERD (gastroesophageal reflux disease)   . Allergic rhinitis   . Lung nodule 2009  . PONV (postoperative nausea and vomiting)   . Shortness of breath     exertion  . Headache(784.0)   . Arthritis   . Anxiety   . Bipolar 1 disorder   . Depression   . Cancer     breast   . Lung cancer    Past Surgical History  Procedure Laterality Date  . Tubal ligation    . Tonsillectomy    . Cesarean section    . Breast surgery  2005    lumpectomy - left  . Lung cancer surgery Right 05/23/2012    reportedly clear   Family History  Problem Relation Age of Onset  . Heart disease Mother     CHF  . Physical abuse Mother   . Depression Mother   . Cancer Sister   . ADD / ADHD Sister   . Alcohol abuse Father   . Alcohol abuse Brother   . Mental illness Brother   . Anxiety disorder Neg Hx   . Bipolar disorder Neg Hx   . Dementia Neg Hx   . Drug abuse Neg Hx   . Paranoid behavior Neg Hx   . Schizophrenia Neg Hx   . Seizures Neg Hx   . Sexual abuse Neg Hx   . Cancer Sister   . Heart disease Sister   . Physical abuse Sister   . OCD Sister   . Mental illness Sister    History   Substance Use Topics  . Smoking status: Former Smoker -- 1.00 packs/day for 10 years    Types: Cigarettes    Quit date: 08/14/1974  . Smokeless tobacco: Never Used  . Alcohol Use: No   OB History    No data available     Review of Systems  Musculoskeletal: Positive for arthralgias.  Neurological: Negative for numbness.      Allergies  Bee venom and Penicillins  Home Medications   Prior to Admission medications   Medication Sig Start Date End Date Taking? Authorizing Provider  albuterol (PROVENTIL HFA;VENTOLIN HFA) 108 (90 BASE) MCG/ACT inhaler Inhale 2 puffs into the lungs every 6 (six) hours as needed for wheezing or shortness of breath. 06/16/14   Susy Frizzle, MD  amitriptyline (ELAVIL) 25 MG tablet Take 1 tablet (25 mg total) by mouth at bedtime. 09/30/14   Cloria Spring, MD  EPINEPHrine 0.3 mg/0.3 mL IJ SOAJ injection Inject 0.3 mLs (0.3 mg total) into the muscle once. 11/30/14   Susy Frizzle, MD  FLUoxetine (PROZAC) 40 MG capsule Take 1 capsule (40 mg total) by mouth daily. 09/30/14 09/30/15  Cloria Spring,  MD  hydrochlorothiazide (HYDRODIURIL) 25 MG tablet TAKE 1 TABLET (25 MG TOTAL) BY MOUTH DAILY. 07/27/14   Susy Frizzle, MD  pantoprazole (PROTONIX) 40 MG tablet Take 1 tablet (40 mg total) by mouth daily. 02/10/14   Susy Frizzle, MD  polyethylene glycol powder (GLYCOLAX/MIRALAX) powder Take 17 g by mouth daily as needed (constipation). 02/05/13   Darrol Jump, MD  rosuvastatin (CRESTOR) 40 MG tablet Take 1 tablet (40 mg total) by mouth daily. 08/27/14   Susy Frizzle, MD   BP 125/83 mmHg  Pulse 98  Temp(Src) 98.1 F (36.7 C) (Oral)  Resp 20  Ht 5\' 4"  (1.626 m)  Wt 154 lb (69.854 kg)  BMI 26.42 kg/m2  SpO2 99% Physical Exam  Constitutional: She is oriented to person, place, and time. She appears well-developed and well-nourished.  Non-toxic appearance.  HENT:  Head: Normocephalic.  Right Ear: Tympanic membrane and external ear normal.  Left  Ear: Tympanic membrane and external ear normal.  Eyes: EOM and lids are normal. Pupils are equal, round, and reactive to light.  Neck: Normal range of motion. Neck supple. Carotid bruit is not present.  Cardiovascular: Normal rate, regular rhythm, normal heart sounds, intact distal pulses and normal pulses.   Pulmonary/Chest: Breath sounds normal. No respiratory distress.  Abdominal: Soft. Bowel sounds are normal. There is no tenderness. There is no guarding.  Musculoskeletal: Normal range of motion.  There is full range of motion of the right knee. There is no deformity of the anterior tibial area on the right. Distal range of motion of the right ankle. The Achilles tendon is intact. There is soreness of the dorsum of the right ankle and dorsum of the foot. There is full range of motion of the toes of the right foot. There is no evidence for any subungual hematoma. Dorsalis pedis pulses 2+.  Lymphadenopathy:       Head (right side): No submandibular adenopathy present.       Head (left side): No submandibular adenopathy present.    She has no cervical adenopathy.  Neurological: She is alert and oriented to person, place, and time. She has normal strength. No cranial nerve deficit or sensory deficit.  Skin: Skin is warm and dry.  Psychiatric: She has a normal mood and affect. Her speech is normal.  Nursing note and vitals reviewed.   ED Course  Procedures (including critical care time) Labs Review Labs Reviewed - No data to display  Imaging Review Dg Foot Complete Right  11/30/2014   CLINICAL DATA:  Foot pain  EXAM: RIGHT FOOT COMPLETE - 3+ VIEW  COMPARISON:  None.  FINDINGS: Small bony density adjacent to the medial navicular has a chronic appearance. No definite acute fracture. No dislocation. Mild degenerative changes in the first metatarsophalangeal joint.  IMPRESSION: No acute bony pathology.  Chronic change.   Electronically Signed   By: Marybelle Killings M.D.   On: 11/30/2014 21:35      EKG Interpretation None      MDM  X-ray of the right foot was reviewed by me. There is no fracture appreciated and no deformity appreciated. Suspect the patient has a contusion to the foot and ankle. The patient will be wrapped with an Ace wrap, and placed in a postoperative shoe. I have offered crutches, however she refuses crutches at this time, states she has a cane that she would like to use. Prescription for ibuprofen and Norco given to the patient.    Final diagnoses:  None    **I have reviewed nursing notes, vital signs, and all appropriate lab and imaging results for this patient.Lily Kocher, PA-C 11/30/14 [place], MD 12/01/14 [phone]
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
## 2014-11-30 NOTE — ED Notes (Signed)
Pt verbalized understanding of no driving and to use caution within 4 hours of taking pain meds due to meds cause drowsiness 

## 2014-11-30 NOTE — Discharge Instructions (Signed)
Your x-ray is negative for fracture or dislocation. Please use ibuprofen and Norco 3 times daily with a meal. May also use hydrocodone at bedtime if needed. Hydrocodone may cause drowsiness, please use only when needed, and use with caution. Please apply ice, use your Ace bandage, and keep your foot elevated above your waist is much as possible. Please discuss your injury with Dr. Dennard Schaumann, and please take your medications with you so that they may be made a part of your permanent record. Foot Contusion  A foot contusion is a deep bruise to the foot. Contusions happen when an injury causes bleeding under the skin. Signs of bruising include pain, puffiness (swelling), and discolored skin. The contusion may turn blue, purple, or yellow. HOME CARE  Put ice on the injured area.  Put ice in a plastic bag.  Place a towel between your skin and the bag.  Leave the ice on for 15-20 minutes, 03-04 times a day.  Only take medicines as told by your doctor.  Use an elastic wrap only as told. You may remove the wrap for sleeping, showering, and bathing. Take the wrap off if you lose feeling (numb) in your toes, or they turn blue or cold. Put the wrap on more loosely.  Keep the foot raised (elevated) with pillows.  If your foot hurts, avoid standing or walking.  When your doctor says it is okay to use your foot, start using it slowly. If you have pain, lessen how much you use your foot.  See your doctor as told. GET HELP RIGHT AWAY IF:   You have more redness, puffiness, or pain in your foot.  Your puffiness or pain does not get better with medicine.  You lose feeling in your foot, or you cannot move your toes.  Your foot turns cold or blue.  You have pain when you move your toes.  Your foot feels warm.  Your contusion does not get better in 2 days. MAKE SURE YOU:   Understand these instructions.  Will watch this condition.  Will get help right away if you or your child is not doing well  or gets worse. Document Released: 05/09/2008 Document Revised: 01/30/2012 Document Reviewed: 07/04/2011 [facility] Patient Information 2015 [place], [place]. This information is not intended to replace advice given to you by your health care provider. Make sure you discuss any questions you have with your health care provider.

## 2014-11-30 NOTE — Progress Notes (Signed)
Subjective:    Patient ID: Deborah Mullins, female    DOB: [birth year], 64 y.o.   MRN: [id number]  HPI  Patient is here today for follow-up of her hypertension, hyperlipidemia, and prediabetes.  Blood pressures well controlled today at 110/70. She denies any chest pain shortness of breath or dyspnea on exertion. She has been having some trouble with allergies. We had a long discussion about this today and I recommended trying a combination of Zyrtec and Flonase. Her mammogram and her chest x-ray were all performed in January and were normal. She is also due for follow-up of her hyperlipidemia. She denies any myalgias or right upper quadrant pain on her Crestor. Past Medical History  Diagnosis Date  . Hypertension   . Cancer     breast   . Hyperlipidemia   . GERD (gastroesophageal reflux disease)   . Allergic rhinitis   . Lung nodule 2009  . PONV (postoperative nausea and vomiting)   . Shortness of breath     exertion  . Headache(784.0)   . Arthritis   . Anxiety   . Bipolar 1 disorder   . Depression    Past Surgical History  Procedure Laterality Date  . Tubal ligation    . Tonsillectomy    . Cesarean section    . Breast surgery  2005    lumpectomy - left  . Lung cancer surgery Right 05/23/2012    reportedly clear   Current Outpatient Prescriptions on File Prior to Visit  Medication Sig Dispense Refill  . albuterol (PROVENTIL HFA;VENTOLIN HFA) 108 (90 BASE) MCG/ACT inhaler Inhale 2 puffs into the lungs every 6 (six) hours as needed for wheezing or shortness of breath. 3 Inhaler 1  . amitriptyline (ELAVIL) 25 MG tablet Take 1 tablet (25 mg total) by mouth at bedtime. 90 tablet 2  . FLUoxetine (PROZAC) 40 MG capsule Take 1 capsule (40 mg total) by mouth daily. 90 capsule 2  . hydrochlorothiazide (HYDRODIURIL) 25 MG tablet TAKE 1 TABLET (25 MG TOTAL) BY MOUTH DAILY. 90 tablet 1  . pantoprazole (PROTONIX) 40 MG tablet Take 1 tablet (40 mg total) by mouth daily. 90 tablet 3  .  polyethylene glycol powder (GLYCOLAX/MIRALAX) powder Take 17 g by mouth daily as needed (constipation). 850 g 0  . rosuvastatin (CRESTOR) 40 MG tablet Take 1 tablet (40 mg total) by mouth daily. 90 tablet 0   No current facility-administered medications on file prior to visit.   Allergies  Allergen Reactions  . Bee Venom Anaphylaxis  . Penicillins Rash   History   Social History  . Marital Status: Married    Spouse Name: N/A  . Number of Children: 1  . Years of Education: N/A   Occupational History  . retired     Location manager   Social History Main Topics  . Smoking status: Former Smoker -- 1.00 packs/day for 10 years    Types: Cigarettes    Quit date: 08/14/1974  . Smokeless tobacco: Never Used  . Alcohol Use: No  . Drug Use: Yes    Special: Marijuana  . Sexual Activity: No   Other Topics Concern  . Not on file   Social History Narrative   Married.    Both pt and her husband have bipolar. She says he "Gets moody and leaves for a while" then comes back.    "Not very supportive."     Review of Systems  All other systems reviewed and are negative.  Objective:   Physical Exam  Constitutional: She appears well-developed and well-nourished.  Cardiovascular: Normal rate, regular rhythm, normal heart sounds and intact distal pulses.   No murmur heard. Pulmonary/Chest: Effort normal and breath sounds normal. No respiratory distress. She has no wheezes. She has no rales.  Abdominal: Soft. Bowel sounds are normal. She exhibits no distension. There is no tenderness. There is no rebound.  Vitals reviewed.         Assessment & Plan:  Hyperglycemia - Plan: Hemoglobin A1c, CBC with Differential/Platelet  Essential hypertension - Plan: CBC with Differential/Platelet  HLD (hyperlipidemia) - Plan: COMPLETE METABOLIC PANEL WITH GFR, Lipid panel, CBC with Differential/Platelet  Blood pressures well controlled today. I will check a fasting lipid panel. Goal  LDL cholesterol is less than 130. Patient has had pneumonia vaccine. She is not due for Prevnar 13 until 80. Mammogram is up-to-date. Chest x-ray is up-to-date. Try a combination of Zyrtec and Flonase for allergies. She does have a nodule on her right thumb which appears to be an arthritic nodule on the IP joint. I recommended trying over-the-counter Aleve. If the nodule begins to grow we should get an x-ray to make sure that it is truly arthritis.  She will call me if it worsens.

## 2014-12-01 ENCOUNTER — Encounter: Payer: Self-pay | Admitting: Family Medicine

## 2014-12-01 NOTE — Telephone Encounter (Signed)
Medication refilled per protocol. 

## 2014-12-10 ENCOUNTER — Other Ambulatory Visit (HOSPITAL_COMMUNITY): Payer: Self-pay | Admitting: Psychiatry

## 2014-12-10 ENCOUNTER — Telehealth (HOSPITAL_COMMUNITY): Payer: Self-pay | Admitting: *Deleted

## 2014-12-10 MED ORDER — FLUOXETINE HCL 40 MG PO CAPS: 40.0000 mg | ORAL_CAPSULE | Freq: Every day | ORAL | Status: DC

## 2014-12-10 NOTE — Telephone Encounter (Signed)
Pt called stating that on 09-14-14 she picked up her last script. Per pt, pharmacy informed her that they do not have refills for her. In Epic it states that pt Fluoxetine 40 mg QD was sent in via computer  09-30-14 at 9:38pm with 90 capsules 2 refills. Informed pt and she stated that pharmacy informed her they do not have her script. Called pt pharmacy and Raquel Sarna stated they last filled pt medication 09-14-14. Informed Shawn what office records stated and he stated that they don't have any script from 09-30-14 and if could resend it to them. Pt pharmacy number is [phone]. Informed Dr. Harrington Challenger of this situation and she stated that office can resend script to pharmacy.

## 2014-12-29 ENCOUNTER — Encounter (HOSPITAL_COMMUNITY): Payer: Self-pay | Admitting: Psychiatry

## 2014-12-29 ENCOUNTER — Ambulatory Visit (INDEPENDENT_AMBULATORY_CARE_PROVIDER_SITE_OTHER): Payer: 59 | Admitting: Psychiatry

## 2014-12-29 VITALS — BP 120/78 | HR 82 | Ht 64.0 in | Wt 157.8 lb

## 2014-12-29 DIAGNOSIS — F419 Anxiety disorder, unspecified: Principal | ICD-10-CM

## 2014-12-29 DIAGNOSIS — F332 Major depressive disorder, recurrent severe without psychotic features: Secondary | ICD-10-CM

## 2014-12-29 DIAGNOSIS — F411 Generalized anxiety disorder: Secondary | ICD-10-CM

## 2014-12-29 DIAGNOSIS — F329 Major depressive disorder, single episode, unspecified: Secondary | ICD-10-CM

## 2014-12-29 MED ORDER — FLUOXETINE HCL 40 MG PO CAPS: 40.0000 mg | ORAL_CAPSULE | Freq: Every day | ORAL | Status: DC

## 2014-12-29 MED ORDER — AMITRIPTYLINE HCL 25 MG PO TABS: 25.0000 mg | ORAL_TABLET | Freq: Every day | ORAL | Status: DC
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
## 2014-12-30 ENCOUNTER — Encounter: Payer: Self-pay | Admitting: Family Medicine

## 2015-02-05 ENCOUNTER — Encounter: Payer: Self-pay | Admitting: Family Medicine

## 2015-02-14 ENCOUNTER — Other Ambulatory Visit: Payer: Self-pay | Admitting: Family Medicine

## 2015-02-16 NOTE — Telephone Encounter (Signed)
Medication refilled per protocol. 

## 2015-02-24 ENCOUNTER — Emergency Department (HOSPITAL_COMMUNITY)
Admission: EM | Admit: 2015-02-24 | Discharge: 2015-02-24 | Disposition: A | Discharge status: Home / Self Care | Payer: 59 | Attending: Emergency Medicine | Admitting: Emergency Medicine

## 2015-02-24 ENCOUNTER — Emergency Department (HOSPITAL_COMMUNITY): Payer: 59

## 2015-02-24 ENCOUNTER — Encounter (HOSPITAL_COMMUNITY): Payer: Self-pay | Admitting: Emergency Medicine

## 2015-02-24 DIAGNOSIS — E785 Hyperlipidemia, unspecified: Secondary | ICD-10-CM | POA: Insufficient documentation

## 2015-02-24 DIAGNOSIS — Z87891 Personal history of nicotine dependence: Secondary | ICD-10-CM | POA: Insufficient documentation

## 2015-02-24 DIAGNOSIS — I10 Essential (primary) hypertension: Secondary | ICD-10-CM | POA: Insufficient documentation

## 2015-02-24 DIAGNOSIS — X58XXXA Exposure to other specified factors, initial encounter: Secondary | ICD-10-CM | POA: Insufficient documentation

## 2015-02-24 DIAGNOSIS — Z79899 Other long term (current) drug therapy: Secondary | ICD-10-CM | POA: Diagnosis not present

## 2015-02-24 DIAGNOSIS — Y9389 Activity, other specified: Secondary | ICD-10-CM | POA: Diagnosis not present

## 2015-02-24 DIAGNOSIS — Z880 Allergy status to penicillin: Secondary | ICD-10-CM | POA: Diagnosis not present

## 2015-02-24 DIAGNOSIS — S93421A Sprain of deltoid ligament of right ankle, initial encounter: Secondary | ICD-10-CM

## 2015-02-24 DIAGNOSIS — Z8709 Personal history of other diseases of the respiratory system: Secondary | ICD-10-CM | POA: Diagnosis not present

## 2015-02-24 DIAGNOSIS — S93401A Sprain of unspecified ligament of right ankle, initial encounter: Secondary | ICD-10-CM | POA: Insufficient documentation

## 2015-02-24 DIAGNOSIS — Y998 Other external cause status: Secondary | ICD-10-CM | POA: Insufficient documentation

## 2015-02-24 DIAGNOSIS — Z853 Personal history of malignant neoplasm of breast: Secondary | ICD-10-CM | POA: Insufficient documentation

## 2015-02-24 DIAGNOSIS — Z791 Long term (current) use of non-steroidal anti-inflammatories (NSAID): Secondary | ICD-10-CM | POA: Diagnosis not present

## 2015-02-24 DIAGNOSIS — F329 Major depressive disorder, single episode, unspecified: Secondary | ICD-10-CM | POA: Insufficient documentation

## 2015-02-24 DIAGNOSIS — Z9889 Other specified postprocedural states: Secondary | ICD-10-CM | POA: Insufficient documentation

## 2015-02-24 DIAGNOSIS — F419 Anxiety disorder, unspecified: Secondary | ICD-10-CM | POA: Diagnosis not present

## 2015-02-24 DIAGNOSIS — Y9289 Other specified places as the place of occurrence of the external cause: Secondary | ICD-10-CM | POA: Diagnosis not present

## 2015-02-24 DIAGNOSIS — M1990 Unspecified osteoarthritis, unspecified site: Secondary | ICD-10-CM | POA: Insufficient documentation

## 2015-02-24 DIAGNOSIS — K219 Gastro-esophageal reflux disease without esophagitis: Secondary | ICD-10-CM | POA: Insufficient documentation

## 2015-02-24 DIAGNOSIS — S99911A Unspecified injury of right ankle, initial encounter: Secondary | ICD-10-CM | POA: Diagnosis present

## 2015-02-24 HISTORY — DX: Other specified postprocedural states: Z98.890

## 2015-02-24 MED ORDER — HYDROCODONE-ACETAMINOPHEN 5-325 MG PO TABS: ORAL_TABLET | ORAL | Status: DC

## 2015-02-24 MED ORDER — IBUPROFEN 800 MG PO TABS
800.0000 mg | ORAL_TABLET | Freq: Once | ORAL | Status: AC
  Administered 2015-02-24: 800 mg via ORAL
  Filled 2015-02-24: qty 1

## 2015-02-24 MED ORDER — IBUPROFEN 800 MG PO TABS: 800.0000 mg | ORAL_TABLET | Freq: Three times a day (TID) | ORAL | Status: DC

## 2015-02-24 MED ORDER — HYDROCODONE-ACETAMINOPHEN 5-325 MG PO TABS
1.0000 | ORAL_TABLET | Freq: Once | ORAL | Status: AC
Start: 2015-02-24 — End: 2015-02-24
  Administered 2015-02-24: 1 via ORAL
  Filled 2015-02-24: qty 1

## 2015-02-24 NOTE — ED Notes (Signed)
Pt states she her knee gave out while working outside. Pt c/o rt knee pain and rt ankle pain.

## 2015-02-24 NOTE — Discharge Instructions (Signed)
Ankle Sprain  An ankle sprain is an injury to the strong, fibrous tissues (ligaments) that hold your ankle bones together.   HOME CARE   · Put ice on your ankle for 1-2 days or as told by your doctor.  ¨ Put ice in a plastic bag.  ¨ Place a towel between your skin and the bag.  ¨ Leave the ice on for 15-20 minutes at a time, every 2 hours while you are awake.  · Only take medicine as told by your doctor.  · Raise (elevate) your injured ankle above the level of your heart as much as possible for 2-3 days.  · Use crutches if your doctor tells you to. Slowly put your own weight on the affected ankle. Use the crutches until you can walk without pain.  · If you have a plaster splint:  ¨ Do not rest it on anything harder than a pillow for 24 hours.  ¨ Do not put weight on it.  ¨ Do not get it wet.  ¨ Take it off to shower or bathe.  · If given, use an elastic wrap or support stocking for support. Take the wrap off if your toes lose feeling (numb), tingle, or turn cold or blue.  · If you have an air splint:  ¨ Add or let out air to make it comfortable.  ¨ Take it off at night and to shower and bathe.  ¨ Wiggle your toes and move your ankle up and down often while you are wearing it.  GET HELP IF:  · You have rapidly increasing bruising or puffiness (swelling).  · Your toes feel very cold.  · You lose feeling in your foot.  · Your medicine does not help your pain.  GET HELP RIGHT AWAY IF:   · Your toes lose feeling (numb) or turn blue.  · You have severe pain that is increasing.  MAKE SURE YOU:   · Understand these instructions.  · Will watch your condition.  · Will get help right away if you are not doing well or get worse.  Document Released: 01/17/2008 Document Revised: 12/15/2013 Document Reviewed: 02/12/2012  [employer]® Patient Information ©2015 [employer]. This information is not intended to replace advice given to you by your health care provider. Make sure you discuss any questions you have with your health care  provider.

## 2015-02-24 NOTE — ED Provider Notes (Signed)
CSN: [id number]     Arrival date & time 02/24/15  1858 History   First MD Initiated Contact with Patient 02/24/15 1905     Chief Complaint  Patient presents with  . Knee Pain     (Consider location/radiation/quality/duration/timing/severity/associated sxs/prior Treatment) HPI  Deborah Mullins is a 64 y.o. female who presents to the Emergency Department complaining of pain to her right ankle that began earlier this afternoon.  She reports working outside when her right knee "gave away" causing her to twist her ankle.  Now has ankle pain with weight bearing.  She states the knee pain is mild and recurrent.  She denies numbness or weakness of the extremity, calf pain and other injuries.     Past Medical History  Diagnosis Date  . Hypertension   . Hyperlipidemia   . GERD (gastroesophageal reflux disease)   . Allergic rhinitis   . Lung nodule 2009  . PONV (postoperative nausea and vomiting)   . Shortness of breath     exertion  . Headache(784.0)   . Arthritis   . Anxiety   . Bipolar 1 disorder   . Depression   . Cancer     breast   . Lung cancer   . History of lung surgery    Past Surgical History  Procedure Laterality Date  . Tubal ligation    . Tonsillectomy    . Cesarean section    . Breast surgery  2005    lumpectomy - left  . Lung cancer surgery Right 05/23/2012    reportedly clear   Family History  Problem Relation Age of Onset  . Heart disease Mother     CHF  . Physical abuse Mother   . Depression Mother   . Cancer Sister   . ADD / ADHD Sister   . Alcohol abuse Father   . Alcohol abuse Brother   . Mental illness Brother   . Anxiety disorder Neg Hx   . Bipolar disorder Neg Hx   . Dementia Neg Hx   . Drug abuse Neg Hx   . Paranoid behavior Neg Hx   . Schizophrenia Neg Hx   . Seizures Neg Hx   . Sexual abuse Neg Hx   . Cancer Sister   . Heart disease Sister   . Physical abuse Sister   . OCD Sister   . Mental illness Sister    History  Substance  Use Topics  . Smoking status: Former Smoker -- 1.00 packs/day for 10 years    Types: Cigarettes    Quit date: 08/14/1974  . Smokeless tobacco: Never Used  . Alcohol Use: No   OB History    No data available     Review of Systems  Constitutional: Negative for fever and chills.  Genitourinary: Negative for dysuria and difficulty urinating.  Musculoskeletal: Positive for joint swelling and arthralgias (right ankle).  Skin: Negative for color change and wound.  All other systems reviewed and are negative.     Allergies  Bee venom and Penicillins  Home Medications   Prior to Admission medications   Medication Sig Start Date End Date Taking? Authorizing Provider  albuterol (PROVENTIL HFA;VENTOLIN HFA) 108 (90 BASE) MCG/ACT inhaler Inhale 2 puffs into the lungs every 6 (six) hours as needed for wheezing or shortness of breath. 06/16/14   Susy Frizzle, MD  amitriptyline (ELAVIL) 25 MG tablet Take 1 tablet (25 mg total) by mouth at bedtime. 12/29/14   Cloria Spring, MD  CRESTOR 40 MG tablet TAKE 1 TABLET (40 MG TOTAL) BY MOUTH DAILY. 12/01/14   Susy Frizzle, MD  EPINEPHrine 0.3 mg/0.3 mL IJ SOAJ injection Inject 0.3 mLs (0.3 mg total) into the muscle once. 11/30/14   Susy Frizzle, MD  FLUoxetine (PROZAC) 40 MG capsule Take 1 capsule (40 mg total) by mouth daily. 12/29/14 12/29/15  Cloria Spring, MD  hydrochlorothiazide (HYDRODIURIL) 25 MG tablet TAKE 1 TABLET (25 MG TOTAL) BY MOUTH DAILY. 07/27/14   Susy Frizzle, MD  HYDROcodone-acetaminophen (NORCO/VICODIN) 5-325 MG per tablet 1 tab by mouth 4 times daily with meals and at bedtime when necessary 11/30/14   Lily Kocher, PA-C  ibuprofen (ADVIL,MOTRIN) 800 MG tablet Take 1 tablet (800 mg total) by mouth 3 (three) times daily. 11/30/14   Lily Kocher, PA-C  pantoprazole (PROTONIX) 40 MG tablet TAKE 1 TABLET (40 MG TOTAL) BY MOUTH DAILY. 02/16/15   Susy Frizzle, MD  polyethylene glycol powder (GLYCOLAX/MIRALAX) powder Take 17 g  by mouth daily as needed (constipation). 02/05/13   Darrol Jump, MD   BP 123/82 mmHg  Pulse 97  Temp(Src) 98.1 F (36.7 C) (Oral)  Resp 20  Ht '5\' 3"'$  (1.6 m)  Wt 154 lb (69.854 kg)  BMI 27.29 kg/m2  SpO2 99% Physical Exam  Constitutional: She is oriented to person, place, and time. She appears well-developed and well-nourished. No distress.  HENT:  Head: Normocephalic and atraumatic.  Cardiovascular: Normal rate, regular rhythm, normal heart sounds and intact distal pulses.   Pulmonary/Chest: Effort normal and breath sounds normal. No respiratory distress.  Musculoskeletal: She exhibits tenderness. She exhibits no edema.  Localized ttp of the dorsal right ankle, mild STS is present.  ROM is preserved.  DP pulse is brisk,distal sensation intact.  No erythema, abrasion, bruising or bony deformity.  No proximal tenderness.  Right knee is NT, has full ROM  Neurological: She is alert and oriented to person, place, and time. She exhibits normal muscle tone. Coordination normal.  Skin: Skin is warm and dry.  Nursing note and vitals reviewed.   ED Course  Procedures (including critical care time) Labs Review Labs Reviewed - No data to display  Imaging Review Dg Ankle Complete Right  02/24/2015   CLINICAL DATA:  64 year old female with right ankle pain after her knee gave out on her while working outside earlier today  EXAM: RIGHT ANKLE - COMPLETE 3+ VIEW  COMPARISON:  Prior radiographs of the right foot 11/30/2014  FINDINGS: There is no evidence of fracture, dislocation, or joint effusion. There is no evidence of arthropathy or other focal bone abnormality. Soft tissues are unremarkable.  IMPRESSION: Negative.   Electronically Signed   By: Jacqulynn Cadet M.D.   On: 02/24/2015 19:58     EKG Interpretation None      MDM   Final diagnoses:  Sprain, ankle joint, medial, right, initial encounter     XR neg for fx.  Likely sprain,   aso applied,  Remains NV intact.  Pain  improved.  Agrees to ortho f/u in one week if needed    Kem Parkinson, PA-C 02/26/15 [place], MD 02/27/15 [phone]

## 2015-03-31 ENCOUNTER — Ambulatory Visit (HOSPITAL_COMMUNITY): Payer: Self-pay | Admitting: Psychiatry

## 2015-03-31 ENCOUNTER — Encounter (HOSPITAL_COMMUNITY): Payer: Self-pay | Admitting: Psychiatry

## 2015-04-26 ENCOUNTER — Encounter (HOSPITAL_COMMUNITY): Payer: Self-pay | Admitting: Psychiatry

## 2015-04-26 ENCOUNTER — Ambulatory Visit (INDEPENDENT_AMBULATORY_CARE_PROVIDER_SITE_OTHER): Payer: 59 | Admitting: Psychiatry

## 2015-04-26 VITALS — BP 122/76 | HR 95 | Ht 63.0 in | Wt 158.4 lb

## 2015-04-26 DIAGNOSIS — F411 Generalized anxiety disorder: Secondary | ICD-10-CM | POA: Diagnosis not present

## 2015-04-26 DIAGNOSIS — F329 Major depressive disorder, single episode, unspecified: Secondary | ICD-10-CM

## 2015-04-26 DIAGNOSIS — F419 Anxiety disorder, unspecified: Principal | ICD-10-CM

## 2015-04-26 DIAGNOSIS — F332 Major depressive disorder, recurrent severe without psychotic features: Secondary | ICD-10-CM | POA: Diagnosis not present

## 2015-04-26 DIAGNOSIS — F32A Depression, unspecified: Secondary | ICD-10-CM

## 2015-04-26 MED ORDER — FLUOXETINE HCL 40 MG PO CAPS: 40.0000 mg | ORAL_CAPSULE | Freq: Every day | ORAL | Status: DC
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
## 2015-05-17 ENCOUNTER — Other Ambulatory Visit: Payer: Self-pay | Admitting: Family Medicine

## 2015-05-19 ENCOUNTER — Other Ambulatory Visit: Payer: Self-pay | Admitting: Family Medicine

## 2015-07-12 ENCOUNTER — Ambulatory Visit (INDEPENDENT_AMBULATORY_CARE_PROVIDER_SITE_OTHER): Payer: 59 | Admitting: Physician Assistant

## 2015-07-12 ENCOUNTER — Encounter: Payer: Self-pay | Admitting: Physician Assistant

## 2015-07-12 VITALS — BP 122/74 | HR 80 | Temp 98.1°F | Resp 18 | Wt 162.0 lb

## 2015-07-12 DIAGNOSIS — J988 Other specified respiratory disorders: Secondary | ICD-10-CM | POA: Diagnosis not present

## 2015-07-12 DIAGNOSIS — G6289 Other specified polyneuropathies: Secondary | ICD-10-CM

## 2015-07-12 DIAGNOSIS — B9689 Other specified bacterial agents as the cause of diseases classified elsewhere: Principal | ICD-10-CM

## 2015-07-12 MED ORDER — AZITHROMYCIN 250 MG PO TABS: ORAL_TABLET | ORAL | Status: DC

## 2015-07-13 NOTE — Progress Notes (Signed)
Patient ID: Deborah Mullins MRN: [id number], DOB: 1951/07/29, 64 y.o. Date of Encounter: 07/13/2015, 11:28 AM    Chief Complaint:  Chief Complaint  Patient presents with  . sick x 1 week    ears hurt, headaches, congestion  . neuropathy in feet     HPI: 64 y.o. year old white female presents with above symptoms. States that she's been having head congestion. Says that her throat is sore in the mornings but then that resolves after the early morning. Says that for the past 3-4 days she has been feeling pressure in her ears. Has had no chest congestion. No known fevers or chills.      Home Meds:   Outpatient Prescriptions Prior to Visit  Medication Sig Dispense Refill  . albuterol (PROVENTIL HFA;VENTOLIN HFA) 108 (90 BASE) MCG/ACT inhaler Inhale 2 puffs into the lungs every 6 (six) hours as needed for wheezing or shortness of breath. 3 Inhaler 1  . FLUoxetine (PROZAC) 40 MG capsule Take 1 capsule (40 mg total) by mouth daily. 90 capsule 2  . hydrochlorothiazide (HYDRODIURIL) 25 MG tablet TAKE 1 TABLET (25 MG TOTAL) BY MOUTH DAILY. 90 tablet 0  . ibuprofen (ADVIL,MOTRIN) 800 MG tablet Take 1 tablet (800 mg total) by mouth 3 (three) times daily. (Patient taking differently: Take 800 mg by mouth as needed. ) 21 tablet 0  . pantoprazole (PROTONIX) 40 MG tablet TAKE 1 TABLET (40 MG TOTAL) BY MOUTH DAILY. 90 tablet 3  . polyethylene glycol powder (GLYCOLAX/MIRALAX) powder Take 17 g by mouth daily as needed (constipation). 850 g 0  . rosuvastatin (CRESTOR) 40 MG tablet TAKE 1 TABLET (40 MG TOTAL) BY MOUTH DAILY. 90 tablet 0  . EPINEPHrine 0.3 mg/0.3 mL IJ SOAJ injection Inject 0.3 mLs (0.3 mg total) into the muscle once. (Patient not taking: Reported on 07/12/2015) 1 Device 3  . HYDROcodone-acetaminophen (NORCO/VICODIN) 5-325 MG per tablet Take one tab po q 4-6 hrs prn pain (Patient not taking: Reported on 07/12/2015) 12 tablet 0   No facility-administered medications prior to visit.     Allergies:  Allergies  Allergen Reactions  . Bee Venom Anaphylaxis  . Penicillins Rash      Review of Systems: See HPI for pertinent ROS. All other ROS negative.    Physical Exam: Blood pressure 122/74, pulse 80, temperature 98.1 F (36.7 C), temperature source Oral, resp. rate 18, weight 162 lb (73.483 kg)., Body mass index is 28.7 kg/(m^2). General:  WF. Appears in no acute distress. HEENT: Normocephalic, atraumatic, eyes without discharge, sclera non-icteric, nares are without discharge. Bilateral auditory canals clear, TM's are without perforation, pearly grey and translucent with reflective cone of light bilaterally. Oral cavity moist, posterior pharynx with mild erythema.  With no exudate, no peritonsillar abscess. No tenderness with percussion of frontal or maxillary sinuses bilaterally.  Neck: Supple. No thyromegaly. No lymphadenopathy. Lungs: Clear bilaterally to auscultation without wheezes, rales, or rhonchi. Breathing is unlabored. Heart: Regular rhythm. No murmurs, rubs, or gallops. Msk:  Strength and tone normal for age. Extremities/Skin: Warm and dry. Neuro: Alert and oriented X 3. Moves all extremities spontaneously. Gait is normal. CNII-XII grossly in tact. Psych:  Responds to questions appropriately with a normal affect.     ASSESSMENT AND PLAN:  64 y.o. year old female with  1. Bacterial respiratory infection Penicillin allergy. She is to take this antibiotic as directed. If symptoms do not resolve within 1 week after completion of antibiotics and follow-up. - azithromycin (ZITHROMAX) 250 MG  tablet; Day 1: Take 2 daily. Days 2-5: Take 1 daily.  Dispense: 6 tablet; Refill: 0  2. Other polyneuropathy ([name]) At the end of the visit, after I have printed the AVS and everything, she says that she was recently at [facility] neurology with her husband for his appointment. Says that she told them about some symptoms that she has been experiencing and they told her that  they could evaluate and treat that and for her to see them. Says that she " has restless leg symptoms and her legs jump at night and prevent her from staying asleep". Then starts talking about her" feet burning if she stands more than 30 minutes". Then started talking about other areas of her body that has aches and pains. I then explained to her that I could also evaluate this but that she would need to return for another office visit so that we would have ample time to evaluate this properly and do lab work etc. She was not happy with this and at the end of our discussion, she still requested a referral to follow-up with [name] neurology so referral ordered. - Ambulatory referral to Neurology   Signed, [facility] Sarah Ann, [place], [facility] 07/13/2015 11:28 AM

## 2015-07-20 ENCOUNTER — Ambulatory Visit (INDEPENDENT_AMBULATORY_CARE_PROVIDER_SITE_OTHER): Payer: 59 | Admitting: Diagnostic Neuroimaging

## 2015-07-20 ENCOUNTER — Encounter: Payer: Self-pay | Admitting: Diagnostic Neuroimaging

## 2015-07-20 VITALS — BP 134/80 | HR 81 | Ht 63.5 in | Wt 162.4 lb

## 2015-07-20 DIAGNOSIS — G2581 Restless legs syndrome: Secondary | ICD-10-CM

## 2015-07-20 DIAGNOSIS — M25532 Pain in left wrist: Secondary | ICD-10-CM

## 2015-07-20 DIAGNOSIS — R292 Abnormal reflex: Secondary | ICD-10-CM

## 2015-07-20 DIAGNOSIS — M542 Cervicalgia: Secondary | ICD-10-CM

## 2015-07-20 DIAGNOSIS — R252 Cramp and spasm: Secondary | ICD-10-CM | POA: Diagnosis not present

## 2015-07-20 DIAGNOSIS — R208 Other disturbances of skin sensation: Secondary | ICD-10-CM | POA: Diagnosis not present

## 2015-07-20 MED ORDER — PREGABALIN 50 MG PO CAPS: 50.0000 mg | ORAL_CAPSULE | Freq: Three times a day (TID) | ORAL | Status: DC

## 2015-07-20 NOTE — Patient Instructions (Addendum)
Thank you for coming to see [place] at [facility]. I hope we have been able to provide you high quality care today.  You may receive a patient satisfaction survey over the next few weeks. We would appreciate your feedback and comments so that we may continue to improve ourselves and the health of our patients.  - I will check labs and MRI cervical spine - try lyrica $RemoveBe'50mg'bcRmDrdiD$  at bedtime; increase to twice a day or three times per day as tolerated   ~~~~~~~~~~~~~~~~~~~~~~~~~~~~~~~~~~~~~~~~~~~~~~~~~~~~~~~~~~~~~~~~~  DR. Jayne Peckenpaugh'S GUIDE TO HAPPY AND HEALTHY LIVING These are some of my general health and wellness recommendations. Some of them may apply to you better than others. Please use common sense as you try these suggestions and feel free to ask me any questions.   ACTIVITY/FITNESS Mental, social, emotional and physical stimulation are very important for brain and body health. Try learning a new activity (arts, music, language, sports, games).  Keep moving your body to the best of your abilities. You can do this at home, inside or outside, the park, community center, gym or anywhere you like. Consider a physical therapist or personal trainer to get started. Consider the app Sworkit. Fitness trackers such as smart-watches, smart-phones or Fitbits can help as well.   NUTRITION Eat more plants: colorful vegetables, nuts, seeds and berries.  Eat less sugar, salt, preservatives and processed foods.  Avoid toxins such as cigarettes and alcohol.  Drink water when you are thirsty. Warm water with a slice of lemon is an excellent morning drink to start the day.  Consider these websites for more information The Nutrition Source (https://www.henry-hernandez.biz/) Precision Nutrition (WindowBlog.ch)   RELAXATION Consider practicing mindfulness meditation or other relaxation techniques such as deep breathing, prayer, yoga, tai chi,  massage. See website mindful.org or the apps Headspace or Calm to help get started.   SLEEP Try to get at least 7-8+ hours sleep per day. Regular exercise and reduced caffeine will help you sleep better. Practice good sleep hygeine techniques. See website sleep.org for more information.   PLANNING Prepare estate planning, living will, healthcare POA documents. Sometimes this is best planned with the help of an attorney. Theconversationproject.org and agingwithdignity.org are excellent resources.

## 2015-07-20 NOTE — Progress Notes (Signed)
[facility]  PATIENT: Deborah Mullins DOB: 12-02-1950  REFERRING CLINICIAN: Dena Billet HISTORY FROM: patient  REASON FOR VISIT: new consult   HISTORICAL  CHIEF COMPLAINT:  Chief Complaint  Patient presents with  . Polyneuropathy    rm 7, New patient, "feet burning, RLS, leg cramps"    HISTORY OF PRESENT ILLNESS:   64 year old right-handed female here for evaluation of burning sensation in feet, leg cramps and restless legs. Patient has history of breast cancer and lung cancer. Symptoms worse in the evening and with activity. Her legs tend to jump especially when she is trying to lay down to sleep. This is interrupting her quality of sleep.  Also with significant stress, depression, anxiety.  REVIEW OF SYSTEMS: Full 14 system review of systems performed and notable only for fatigue chest pain ringing in ears spinning sensation short of breath wheezing snoring blood in stool constipation joint pain cramps aching muscles allergies feeling hot feeling cold.  ALLERGIES: Allergies  Allergen Reactions  . Bee Venom Anaphylaxis  . Penicillins Rash    HOME MEDICATIONS: Outpatient Prescriptions Prior to Visit  Medication Sig Dispense Refill  . albuterol (PROVENTIL HFA;VENTOLIN HFA) 108 (90 BASE) MCG/ACT inhaler Inhale 2 puffs into the lungs every 6 (six) hours as needed for wheezing or shortness of breath. 3 Inhaler 1  . FLUoxetine (PROZAC) 40 MG capsule Take 1 capsule (40 mg total) by mouth daily. 90 capsule 2  . hydrochlorothiazide (HYDRODIURIL) 25 MG tablet TAKE 1 TABLET (25 MG TOTAL) BY MOUTH DAILY. 90 tablet 0  . ibuprofen (ADVIL,MOTRIN) 800 MG tablet Take 1 tablet (800 mg total) by mouth 3 (three) times daily. (Patient taking differently: Take 800 mg by mouth as needed. ) 21 tablet 0  . pantoprazole (PROTONIX) 40 MG tablet TAKE 1 TABLET (40 MG TOTAL) BY MOUTH DAILY. 90 tablet 3  . polyethylene glycol powder (GLYCOLAX/MIRALAX) powder Take 17 g by mouth daily  as needed (constipation). 850 g 0  . rosuvastatin (CRESTOR) 40 MG tablet TAKE 1 TABLET (40 MG TOTAL) BY MOUTH DAILY. 90 tablet 0  . EPINEPHrine 0.3 mg/0.3 mL IJ SOAJ injection Inject 0.3 mLs (0.3 mg total) into the muscle once. (Patient not taking: Reported on 07/12/2015) 1 Device 3  . azithromycin (ZITHROMAX) 250 MG tablet Day 1: Take 2 daily. Days 2-5: Take 1 daily. 6 tablet 0  . HYDROcodone-acetaminophen (NORCO/VICODIN) 5-325 MG per tablet Take one tab po q 4-6 hrs prn pain (Patient not taking: Reported on 07/12/2015) 12 tablet 0   No facility-administered medications prior to visit.    PAST MEDICAL HISTORY: Past Medical History  Diagnosis Date  . Hypertension   . Hyperlipidemia   . GERD (gastroesophageal reflux disease)   . Allergic rhinitis   . Lung nodule 2009  . PONV (postoperative nausea and vomiting)   . Shortness of breath     exertion  . Headache(784.0)   . Arthritis   . Anxiety   . Bipolar 1 disorder ([place])   . Depression   . Cancer ([place])     breast   . Lung cancer ([place])   . History of lung surgery     PAST SURGICAL HISTORY: Past Surgical History  Procedure Laterality Date  . Tubal ligation    . Tonsillectomy    . Cesarean section  1971  . Breast surgery  2005    lumpectomy - left  . Lung cancer surgery Right 05/23/2012    reportedly clear  . Carpel tunnel Right  FAMILY HISTORY: Family History  Problem Relation Age of Onset  . Heart disease Mother     CHF  . Physical abuse Mother   . Depression Mother   . Cancer Sister   . ADD / ADHD Sister   . Alcohol abuse Father   . Alcohol abuse Brother   . Mental illness Brother   . Anxiety disorder Neg Hx   . Bipolar disorder Neg Hx   . Dementia Neg Hx   . Drug abuse Neg Hx   . Paranoid behavior Neg Hx   . Schizophrenia Neg Hx   . Seizures Neg Hx   . Sexual abuse Neg Hx   . Cancer Sister   . Heart disease Sister   . Physical abuse Sister   . OCD Sister   . Mental illness Sister     SOCIAL  HISTORY:  Social History   Social History  . Marital Status: Married    Spouse Name: Eddie Dibbles  . Number of Children: 1  . Years of Education: 8   Occupational History  . retired     Location manager   Social History Main Topics  . Smoking status: Former Smoker -- 1.00 packs/day for 10 years    Types: Cigarettes    Quit date: 08/14/1974  . Smokeless tobacco: Never Used  . Alcohol Use: No  . Drug Use: Yes    Special: Marijuana     Comment: 07/20/15 last use 2 yrs ago, uses "once in a while"  . Sexual Activity: No   Other Topics Concern  . Not on file   Social History Narrative   Married.    Both pt and her husband have bipolar. She says he "Gets moody and leaves for a while" then comes back.    "Not very supportive."   Caffeine use- 2 cups/day     PHYSICAL EXAM  GENERAL EXAM/CONSTITUTIONAL: Vitals:  Filed Vitals:   07/20/15 0914  BP: 134/80  Pulse: 81  Height: 5' 3.5" (1.613 m)  Weight: 162 lb 6.4 oz (73.664 kg)     Body mass index is 28.31 kg/(m^2).  Visual Acuity Screening   Right eye Left eye Both eyes  Without correction:     With correction: 20/20 20/30      Patient is in no distress; well developed, nourished and groomed; neck is supple  CARDIOVASCULAR:  Examination of carotid arteries is normal; no carotid bruits  Regular rate and rhythm, no murmurs  Examination of peripheral vascular system by observation and palpation is normal  EYES:  Ophthalmoscopic exam of optic discs and posterior segments is normal; no papilledema or hemorrhages  MUSCULOSKELETAL:  Gait, strength, tone, movements noted in Neurologic exam below  NEUROLOGIC: MENTAL STATUS:  No flowsheet data found.  awake, alert, oriented to person, place and time  recent and remote memory intact  normal attention and concentration  language fluent, comprehension intact, naming intact,   fund of knowledge appropriate  CRANIAL NERVE:   2nd - no papilledema on  fundoscopic exam  2nd, 3rd, 4th, 6th - pupils equal and reactive to light, visual fields full to confrontation, extraocular muscles intact, no nystagmus  5th - facial sensation symmetric  7th - facial strength symmetric  8th - hearing intact  9th - palate elevates symmetrically, uvula midline  11th - shoulder shrug symmetric  12th - tongue protrusion midline  MOTOR:   normal bulk and tone; BUE LIMITED BY PAIN; BLE 5  SENSORY:   normal and symmetric to  light touch, temperature, DECR PP IN FEET; vibration 5 SEC AT TOES  COORDINATION:   finger-nose-finger, fine finger movements normal  REFLEXES:   deep tendon reflexes BRISK and symmetric; POSITIVE HOFFMAN'S ON RIGHT; DOWN GOING TOES; POSITIVE SUPRA-PATELLARS; POSITIVE CROSSED ADDUCTORS; BUE 3, KNEES 3, ANKLES 2  GAIT/STATION:   ANTALGIC GAIT; narrow based gait; romberg is negative    DIAGNOSTIC DATA (LABS, IMAGING, TESTING) - I reviewed patient records, labs, notes, testing and imaging myself where available.  Lab Results  Component Value Date   WBC 7.7 11/30/2014   HGB 13.0 11/30/2014   HCT 40.0 11/30/2014   MCV 89.7 11/30/2014   PLT 264 11/30/2014      Component Value Date/Time   NA 140 11/30/2014 0821   K 4.0 11/30/2014 0821   CL 99 11/30/2014 0821   CO2 32 11/30/2014 0821   GLUCOSE 87 11/30/2014 0821   BUN 19 11/30/2014 0821   CREATININE 0.96 11/30/2014 0821   CREATININE 0.86 08/14/2014 1210   CALCIUM 9.8 11/30/2014 0821   PROT 7.4 11/30/2014 0821   ALBUMIN 4.6 11/30/2014 0821   AST 22 11/30/2014 0821   ALT 21 11/30/2014 0821   ALKPHOS 72 11/30/2014 0821   BILITOT 0.5 11/30/2014 0821   GFRNONAA 63 11/30/2014 0821   GFRNONAA 71* 08/14/2014 1210   GFRAA 73 11/30/2014 0821   GFRAA 82* 08/14/2014 1210   Lab Results  Component Value Date   CHOL 171 11/30/2014   HDL 71 11/30/2014   LDLCALC 76 11/30/2014   TRIG 119 11/30/2014   CHOLHDL 2.4 11/30/2014   Lab Results  Component Value Date    HGBA1C 6.2* 11/30/2014   No results found for: [id number] Lab Results  Component Value Date   TSH 1.343 08/18/2014    08/14/14 CT head  - No focal acute intracranial abnormality identified. No abnormal mass or mass effect is noted.    ASSESSMENT AND PLAN  64 y.o. year old female here with progressive numbness, burning in feet; muscle cramps, pain in hands, restless legs, hyperreflexia, neck pain. Need to rule out cervical myelopathy. Neuropathy and myopathy also possible.   Ddx: neuropathy (metabolic, toxic, autoimmune), myopathy, restless leg syndrome, cervical myelopathy   PLAN: - labs (neuropathy, myopathy eval) and MRI cervical spine - trial of lyrica for neuropathy pain and RLS symptom control (intolerant of gabapentin in the past)  Orders Placed This Encounter  Procedures  . MR Cervical Spine W Wo Contrast  . Hemoglobin A1c  . Vitamin B12  . TSH  . CK  . Aldolase  . ANA w/Reflex if Positive  . Ferritin  . Iron and TIBC   Meds ordered this encounter  Medications  . pregabalin (LYRICA) 50 MG capsule    Sig: Take 1 capsule (50 mg total) by mouth 3 (three) times daily.    Dispense:  90 capsule    Refill:  5   Return in about 6 weeks (around 08/31/2015).    Penni Bombard, MD 04/21/1190, 4:78 AM Certified in Neurology, Neurophysiology and Neuroimaging  [facility] [address], [place], [place] [address] [phone]

## 2015-07-21 LAB — HEMOGLOBIN A1C
Est. average glucose Bld gHb Est-mCnc: 137 mg/dL
HEMOGLOBIN A1C: 6.4 % — AB (ref 4.8–5.6)

## 2015-07-21 LAB — IRON AND TIBC
Iron Saturation: 28 % (ref 15–55)
Iron: 83 ug/dL (ref 27–139)
TIBC: 294 ug/dL (ref 250–450)
UIBC: 211 ug/dL (ref 118–369)

## 2015-07-21 LAB — CK: Total CK: 178 U/L — ABNORMAL HIGH (ref 24–173)

## 2015-07-21 LAB — FERRITIN: FERRITIN: 189 ng/mL — AB (ref 15–150)

## 2015-07-21 LAB — TSH: TSH: 2.17 u[IU]/mL (ref 0.450–4.500)

## 2015-07-21 LAB — ANA W/REFLEX IF POSITIVE: Anti Nuclear Antibody(ANA): NEGATIVE

## 2015-07-21 LAB — VITAMIN B12: Vitamin B-12: 527 pg/mL (ref 211–946)

## 2015-07-21 LAB — ALDOLASE: ALDOLASE: 4.3 U/L (ref 3.3–10.3)

## 2015-07-22 ENCOUNTER — Ambulatory Visit (HOSPITAL_COMMUNITY): Payer: 59 | Admitting: Psychiatry

## 2015-07-26 ENCOUNTER — Telehealth: Payer: Self-pay | Admitting: *Deleted

## 2015-07-26 ENCOUNTER — Ambulatory Visit (HOSPITAL_COMMUNITY): Payer: Self-pay | Admitting: Psychiatry

## 2015-07-26 ENCOUNTER — Other Ambulatory Visit: Payer: Self-pay | Admitting: Family Medicine

## 2015-07-26 DIAGNOSIS — Z1231 Encounter for screening mammogram for malignant neoplasm of breast: Secondary | ICD-10-CM

## 2015-07-26 NOTE — Telephone Encounter (Signed)
Attempted to reach patient on home phone; no answer, no answering machine to leave message. Will call later today.

## 2015-07-26 NOTE — Telephone Encounter (Signed)
Unable to leave message; voice mailbox not set up.

## 2015-07-29 ENCOUNTER — Telehealth: Payer: Self-pay | Admitting: Diagnostic Neuroimaging

## 2015-07-29 NOTE — Telephone Encounter (Signed)
Pt called and says that pregabalin (LYRICA) 50 MG capsule says that even with insurance it is too expensive for her. She wants to know if there is something else for her to take. Please call and advise [phone]

## 2015-08-02 ENCOUNTER — Telehealth: Payer: Self-pay | Admitting: Diagnostic Neuroimaging

## 2015-08-02 MED ORDER — GABAPENTIN 300 MG PO CAPS: 300.0000 mg | ORAL_CAPSULE | Freq: Three times a day (TID) | ORAL | Status: DC

## 2015-08-02 NOTE — Telephone Encounter (Signed)
Returned call to patient. Informed her that this RN was unable to reach her re: lab results. Advised she contact PCP re: increasing Hgb A1C level since April. She requests labs be faxed to his office. Then discussed Lyrica. She states that Gabapentin caused weight gain which was her only "complaint" about taking it. She states that even with her insurance Lyrica is too expensive. So she wants Gabapentin prescribed to her again. She is unsure of dose she was taking previously. Informed would route her request to Dr Leta Baptist, confirmed her pharmacy. Stated she would receive a call back when medication order sent. She verbalized understanding, appreciation. Lab faxed to [facility], Dr Raford Pitcher per patient's request.

## 2015-08-02 NOTE — Telephone Encounter (Signed)
Start gabapentin '300mg'$  at bedtime. Increase every 1-2 weeks up to TID. Rx sent in. - VRP

## 2015-08-02 NOTE — Telephone Encounter (Signed)
Pt called inquiring about changing Lyrica to gabapentin.

## 2015-08-02 NOTE — Telephone Encounter (Signed)
LVM for patient informing her Dr Leta Baptist sent Rx in for Gabapentin. Advised she increase slowly, gave her Dr Gladstone Lighter specific instructions.  Patient called back; gave her instructions verbally. She stated "I have taken it before, so I know what to do." she verbalized understanding, appreciation for call back.

## 2015-08-05 ENCOUNTER — Encounter: Payer: Self-pay | Admitting: Family Medicine

## 2015-08-05 DIAGNOSIS — R7303 Prediabetes: Secondary | ICD-10-CM | POA: Insufficient documentation

## 2015-08-18 ENCOUNTER — Ambulatory Visit (HOSPITAL_COMMUNITY)
Admission: RE | Admit: 2015-08-18 | Discharge: 2015-08-18 | Disposition: A | Discharge status: Home / Self Care | Payer: 59 | Source: Ambulatory Visit | Attending: Diagnostic Neuroimaging | Admitting: Diagnostic Neuroimaging

## 2015-08-18 DIAGNOSIS — R208 Other disturbances of skin sensation: Secondary | ICD-10-CM

## 2015-08-18 DIAGNOSIS — Z85118 Personal history of other malignant neoplasm of bronchus and lung: Secondary | ICD-10-CM | POA: Insufficient documentation

## 2015-08-18 DIAGNOSIS — Z853 Personal history of malignant neoplasm of breast: Secondary | ICD-10-CM | POA: Insufficient documentation

## 2015-08-18 DIAGNOSIS — R252 Cramp and spasm: Secondary | ICD-10-CM

## 2015-08-18 DIAGNOSIS — M4802 Spinal stenosis, cervical region: Secondary | ICD-10-CM | POA: Diagnosis not present

## 2015-08-18 DIAGNOSIS — G2581 Restless legs syndrome: Secondary | ICD-10-CM

## 2015-08-18 DIAGNOSIS — M25532 Pain in left wrist: Secondary | ICD-10-CM

## 2015-08-18 DIAGNOSIS — M542 Cervicalgia: Secondary | ICD-10-CM | POA: Diagnosis present

## 2015-08-18 DIAGNOSIS — R292 Abnormal reflex: Secondary | ICD-10-CM

## 2015-08-18 LAB — POCT I-STAT CREATININE: Creatinine, Ser: 1 mg/dL (ref 0.44–1.00)

## 2015-08-18 MED ORDER — GADOBENATE DIMEGLUMINE 529 MG/ML IV SOLN
14.0000 mL | Freq: Once | INTRAVENOUS | Status: AC | PRN
  Administered 2015-08-18: 14 mL via INTRAVENOUS

## 2015-08-18 NOTE — Telephone Encounter (Signed)
error 

## 2015-08-21 ENCOUNTER — Other Ambulatory Visit: Payer: Self-pay | Admitting: Family Medicine

## 2015-08-27 ENCOUNTER — Encounter (HOSPITAL_COMMUNITY): Payer: Self-pay | Admitting: Psychiatry

## 2015-08-27 ENCOUNTER — Ambulatory Visit (INDEPENDENT_AMBULATORY_CARE_PROVIDER_SITE_OTHER): Payer: 59 | Admitting: Psychiatry

## 2015-08-27 VITALS — BP 110/70 | Ht 63.0 in | Wt 163.0 lb

## 2015-08-27 DIAGNOSIS — F332 Major depressive disorder, recurrent severe without psychotic features: Secondary | ICD-10-CM | POA: Diagnosis not present

## 2015-08-27 DIAGNOSIS — F329 Major depressive disorder, single episode, unspecified: Secondary | ICD-10-CM

## 2015-08-27 DIAGNOSIS — F411 Generalized anxiety disorder: Secondary | ICD-10-CM

## 2015-08-27 DIAGNOSIS — F419 Anxiety disorder, unspecified: Principal | ICD-10-CM

## 2015-08-27 MED ORDER — CLONAZEPAM 0.5 MG PO TABS: 0.5000 mg | ORAL_TABLET | Freq: Every day | ORAL | Status: DC | PRN

## 2015-08-27 MED ORDER — FLUOXETINE HCL 40 MG PO CAPS: 40.0000 mg | ORAL_CAPSULE | Freq: Every day | ORAL | Status: DC
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
## 2015-08-31 ENCOUNTER — Ambulatory Visit (INDEPENDENT_AMBULATORY_CARE_PROVIDER_SITE_OTHER): Payer: 59 | Admitting: Diagnostic Neuroimaging

## 2015-08-31 ENCOUNTER — Encounter: Payer: Self-pay | Admitting: Diagnostic Neuroimaging

## 2015-08-31 VITALS — BP 125/82 | HR 76 | Ht 63.0 in | Wt 162.4 lb

## 2015-08-31 DIAGNOSIS — G2581 Restless legs syndrome: Secondary | ICD-10-CM | POA: Diagnosis not present

## 2015-08-31 DIAGNOSIS — R208 Other disturbances of skin sensation: Secondary | ICD-10-CM

## 2015-08-31 DIAGNOSIS — M542 Cervicalgia: Secondary | ICD-10-CM | POA: Diagnosis not present

## 2015-08-31 NOTE — Patient Instructions (Signed)
Thank you for coming to see [place] at [facility]. I hope we have been able to provide you high quality care today.  You may receive a patient satisfaction survey over the next few weeks. We would appreciate your feedback and comments so that we may continue to improve ourselves and the health of our patients.  - continue gabapentin   ~~~~~~~~~~~~~~~~~~~~~~~~~~~~~~~~~~~~~~~~~~~~~~~~~~~~~~~~~~~~~~~~~  DR. PENUMALLI'S GUIDE TO HAPPY AND HEALTHY LIVING These are some of my general health and wellness recommendations. Some of them may apply to you better than others. Please use common sense as you try these suggestions and feel free to ask me any questions.   ACTIVITY/FITNESS Mental, social, emotional and physical stimulation are very important for brain and body health. Try learning a new activity (arts, music, language, sports, games).  Keep moving your body to the best of your abilities. You can do this at home, inside or outside, the park, community center, gym or anywhere you like. Consider a physical therapist or personal trainer to get started. Consider the app Sworkit. Fitness trackers such as smart-watches, smart-phones or Fitbits can help as well.   NUTRITION Eat more plants: colorful vegetables, nuts, seeds and berries.  Eat less sugar, salt, preservatives and processed foods.  Avoid toxins such as cigarettes and alcohol.  Drink water when you are thirsty. Warm water with a slice of lemon is an excellent morning drink to start the day.  Consider these websites for more information The Nutrition Source (https://www.henry-hernandez.biz/) Precision Nutrition (WindowBlog.ch)   RELAXATION Consider practicing mindfulness meditation or other relaxation techniques such as deep breathing, prayer, yoga, tai chi, massage. See website mindful.org or the apps Headspace or Calm to help get started.   SLEEP Try to get at least  7-8+ hours sleep per day. Regular exercise and reduced caffeine will help you sleep better. Practice good sleep hygeine techniques. See website sleep.org for more information.   PLANNING Prepare estate planning, living will, healthcare POA documents. Sometimes this is best planned with the help of an attorney. Theconversationproject.org and agingwithdignity.org are excellent resources.

## 2015-08-31 NOTE — Progress Notes (Signed)
[facility]  PATIENT: Deborah Mullins DOB: May 09, 1951  REFERRING CLINICIAN: Dena Billet HISTORY FROM: patient  REASON FOR VISIT: follow up    HISTORICAL  CHIEF COMPLAINT:  Chief Complaint  Patient presents with  . Burning sensation of feet    rm 7, "improvement of burning in feet, Gabapentin helping, review MRI C spine"  . Follow-up    6 week    HISTORY OF PRESENT ILLNESS:   UPDATE 08/31/15: Since last visit, neck pain and feet pain stable to slightly better with gabapentin. Has bilateral CTS , s/p surg in right hand, but does not want surg in left hand.   PRIOR HPI (07/20/15):  65 year old right-handed female here for evaluation of burning sensation in feet, leg cramps and restless legs. Patient has history of breast cancer and lung cancer. Symptoms worse in the evening and with activity. Her legs tend to jump especially when she is trying to lay down to sleep. This is interrupting her quality of sleep. Also with significant stress, depression, anxiety.   REVIEW OF SYSTEMS: Full 14 system review of systems performed and notable only for memory loss headache numbness excess eating ringing in ears cough wheezing joint pain back pain neck stiff.   ALLERGIES: Allergies  Allergen Reactions  . Bee Venom Anaphylaxis  . Penicillins Rash    HOME MEDICATIONS: Outpatient Prescriptions Prior to Visit  Medication Sig Dispense Refill  . albuterol (PROVENTIL HFA;VENTOLIN HFA) 108 (90 BASE) MCG/ACT inhaler Inhale 2 puffs into the lungs every 6 (six) hours as needed for wheezing or shortness of breath. 3 Inhaler 1  . clonazePAM (KLONOPIN) 0.5 MG tablet Take 1 tablet (0.5 mg total) by mouth daily as needed for anxiety. 30 tablet 2  . EPINEPHrine 0.3 mg/0.3 mL IJ SOAJ injection Inject 0.3 mLs (0.3 mg total) into the muscle once. 1 Device 3  . FLUoxetine (PROZAC) 40 MG capsule Take 1 capsule (40 mg total) by mouth daily. 90 capsule 2  . gabapentin (NEURONTIN) 300 MG  capsule Take 1 capsule (300 mg total) by mouth 3 (three) times daily. 90 capsule 11  . hydrochlorothiazide (HYDRODIURIL) 25 MG tablet TAKE 1 TABLET BY MOUTH EVERY DAY 90 tablet 0  . pantoprazole (PROTONIX) 40 MG tablet TAKE 1 TABLET (40 MG TOTAL) BY MOUTH DAILY. 90 tablet 3  . polyethylene glycol powder (GLYCOLAX/MIRALAX) powder Take 17 g by mouth daily as needed (constipation). 850 g 0  . rosuvastatin (CRESTOR) 40 MG tablet TAKE 1 TABLET BY MOUTH EVERY DAY 90 tablet 0  . ibuprofen (ADVIL,MOTRIN) 800 MG tablet Take 1 tablet (800 mg total) by mouth 3 (three) times daily. (Patient taking differently: Take 800 mg by mouth as needed. ) 21 tablet 0  . pregabalin (LYRICA) 50 MG capsule Take 1 capsule (50 mg total) by mouth 3 (three) times daily. 90 capsule 5   No facility-administered medications prior to visit.    PAST MEDICAL HISTORY: Past Medical History  Diagnosis Date  . Hypertension   . Hyperlipidemia   . GERD (gastroesophageal reflux disease)   . Allergic rhinitis   . Lung nodule 2009  . PONV (postoperative nausea and vomiting)   . Shortness of breath     exertion  . Headache(784.0)   . Arthritis   . Anxiety   . Bipolar 1 disorder ([place])   . Depression   . Cancer ([place])     breast   . Lung cancer ([place])   . History of lung surgery   . Prediabetes  a1c 6.4 12/16    PAST SURGICAL HISTORY: Past Surgical History  Procedure Laterality Date  . Tubal ligation    . Tonsillectomy    . Cesarean section  1971  . Breast surgery  2005    lumpectomy - left  . Lung cancer surgery Right 05/23/2012    reportedly clear  . Carpel tunnel Right     FAMILY HISTORY: Family History  Problem Relation Age of Onset  . Heart disease Mother     CHF  . Physical abuse Mother   . Depression Mother   . Cancer Sister   . ADD / ADHD Sister   . Alcohol abuse Father   . Alcohol abuse Brother   . Mental illness Brother   . Anxiety disorder Neg Hx   . Bipolar disorder Neg Hx   . Dementia Neg  Hx   . Drug abuse Neg Hx   . Paranoid behavior Neg Hx   . Schizophrenia Neg Hx   . Seizures Neg Hx   . Sexual abuse Neg Hx   . Cancer Sister   . Heart disease Sister   . Physical abuse Sister   . OCD Sister   . Mental illness Sister     SOCIAL HISTORY:  Social History   Social History  . Marital Status: Married    Spouse Name: Eddie Dibbles  . Number of Children: 1  . Years of Education: 8   Occupational History  . retired     Location manager   Social History Main Topics  . Smoking status: Former Smoker -- 1.00 packs/day for 10 years    Types: Cigarettes    Quit date: 08/14/1974  . Smokeless tobacco: Never Used  . Alcohol Use: No  . Drug Use: Yes    Special: Marijuana     Comment: 07/20/15 last use 2 yrs ago, uses "once in a while"  . Sexual Activity: No   Other Topics Concern  . Not on file   Social History Narrative   Married.    Both pt and her husband have bipolar. She says he "Gets moody and leaves for a while" then comes back.    "Not very supportive."   Caffeine use- 2 cups/day     PHYSICAL EXAM  GENERAL EXAM/CONSTITUTIONAL: Vitals:  Filed Vitals:   08/31/15 1444  BP: 125/82  Pulse: 76  Height: '5\' 3"'$  (1.6 m)  Weight: 162 lb 6.4 oz (73.664 kg)   Body mass index is 28.78 kg/(m^2). No exam data present  Patient is in no distress; well developed, nourished and groomed; neck is supple  CARDIOVASCULAR:  Examination of carotid arteries is normal; no carotid bruits  Regular rate and rhythm, no murmurs  Examination of peripheral vascular system by observation and palpation is normal  EYES:  Ophthalmoscopic exam of optic discs and posterior segments is normal; no papilledema or hemorrhages  MUSCULOSKELETAL:  Gait, strength, tone, movements noted in Neurologic exam below  NEUROLOGIC: MENTAL STATUS:  No flowsheet data found.  awake, alert, oriented to person, place and time  recent and remote memory intact  normal attention and  concentration  language fluent, comprehension intact, naming intact,   fund of knowledge appropriate  CRANIAL NERVE:   2nd - no papilledema on fundoscopic exam  2nd, 3rd, 4th, 6th - pupils equal and reactive to light, visual fields full to confrontation, extraocular muscles intact, no nystagmus  5th - facial sensation symmetric  7th - facial strength symmetric  8th - hearing intact  9th - palate elevates symmetrically, uvula midline  11th - shoulder shrug symmetric  12th - tongue protrusion midline  MOTOR:   normal bulk and tone; BUE 5; BLE 5  SENSORY:   normal and symmetric to light touch, temperature, DECR VIB AT TOES 6 sec  COORDINATION:   finger-nose-finger, fine finger movements normal  REFLEXES:   deep tendon reflexes PRESENT and symmetric; SLIGHTLY BRISK BUT INCONSISTENT  GAIT/STATION:   ANTALGIC GAIT; narrow based gait; romberg is negative    DIAGNOSTIC DATA (LABS, IMAGING, TESTING) - I reviewed patient records, labs, notes, testing and imaging myself where available.  Lab Results  Component Value Date   WBC 7.7 11/30/2014   HGB 13.0 11/30/2014   HCT 40.0 11/30/2014   MCV 89.7 11/30/2014   PLT 264 11/30/2014      Component Value Date/Time   NA 140 11/30/2014 0821   K 4.0 11/30/2014 0821   CL 99 11/30/2014 0821   CO2 32 11/30/2014 0821   GLUCOSE 87 11/30/2014 0821   BUN 19 11/30/2014 0821   CREATININE 1.00 08/18/2015 1300   CREATININE 0.96 11/30/2014 0821   CALCIUM 9.8 11/30/2014 0821   PROT 7.4 11/30/2014 0821   ALBUMIN 4.6 11/30/2014 0821   AST 22 11/30/2014 0821   ALT 21 11/30/2014 0821   ALKPHOS 72 11/30/2014 0821   BILITOT 0.5 11/30/2014 0821   GFRNONAA 63 11/30/2014 0821   GFRNONAA 71* 08/14/2014 1210   GFRAA 73 11/30/2014 0821   GFRAA 82* 08/14/2014 1210   Lab Results  Component Value Date   CHOL 171 11/30/2014   HDL 71 11/30/2014   LDLCALC 76 11/30/2014   TRIG 119 11/30/2014   CHOLHDL 2.4 11/30/2014   Lab Results    Component Value Date   HGBA1C 6.4* 07/20/2015   Lab Results  Component Value Date   VITAMINB12 527 07/20/2015   Lab Results  Component Value Date   TSH 2.170 07/20/2015   Lab Results  Component Value Date   CKTOTAL 178* 07/20/2015   TROPONINI <0.03 08/14/2014    08/14/14 CT head  - No focal acute intracranial abnormality identified. No abnormal mass or mass effect is noted.  08/18/15 MRI CERVICAL SPINE  1. Progressive right-sided facet hypertrophy and mild right foraminal narrowing at C2-3. 2. Progressive right-sided facet hypertrophy and mild right foraminal narrowing at C4-5. 3. Progressive asymmetric left-sided facet hypertrophy and moderate left foraminal narrowing at C5-6. Mild central canal narrowing is present as well. 4. Progression of mild facet hypertrophy at C6-7 without significant stenosis. 5. Slight anterolisthesis at C7-T1 without significant stenosis.      ASSESSMENT AND PLAN  65 y.o. year old female here with progressive numbness, burning in feet; muscle cramps, pain in hands, restless legs, neck pain. Also significant current psycho-social stress and prior traumas (emotional and physical). Has borderline A1c and CK. Recommend follow up with PCP. Do not suspect myopathy at this time.   Ddx: neuropathy (borderline diabetes) + restless leg syndrome + cervical myelopathy + carpal tunnel syndrome   PLAN: - continue gabapentin for neuropathy pain and RLS symptom control; increase as tolerated - no surgical mgmt for neck issues - follow up with PCP for general needs and pre-diabetes follow up - if further pain mgmt is needed may need pain clinic referral  Return if symptoms worsen or fail to improve, for return to PCP.    Penni Bombard, MD 3/54/6568, 1:27 PM Certified in Neurology, Neurophysiology and Neuroimaging  [facility] [address],  [place], [place] [address] [phone]

## 2015-09-03 ENCOUNTER — Ambulatory Visit (HOSPITAL_COMMUNITY)
Admission: RE | Admit: 2015-09-03 | Discharge: 2015-09-03 | Disposition: A | Discharge status: Home / Self Care | Payer: 59 | Source: Ambulatory Visit | Attending: Family Medicine | Admitting: Family Medicine

## 2015-09-03 DIAGNOSIS — Z1231 Encounter for screening mammogram for malignant neoplasm of breast: Secondary | ICD-10-CM | POA: Diagnosis not present

## 2015-10-27 ENCOUNTER — Telehealth: Payer: Self-pay | Admitting: *Deleted

## 2015-10-27 MED ORDER — GABAPENTIN 800 MG PO TABS: 800.0000 mg | ORAL_TABLET | Freq: Every day | ORAL | Status: DC

## 2015-10-27 NOTE — Telephone Encounter (Signed)
Spoke with patient who stated she forgets to take med three times a day. She requested to take 800 mg once a day. Per Dr Leta Baptist, new prescription will be sent for 800 mg once a day. Verified preferred pharmacy. Patient verbalized understanding, appreciation.

## 2015-10-27 NOTE — Telephone Encounter (Signed)
Rx sent in. -VRP

## 2015-11-02 ENCOUNTER — Ambulatory Visit (INDEPENDENT_AMBULATORY_CARE_PROVIDER_SITE_OTHER): Payer: 59 | Admitting: Family Medicine

## 2015-11-02 ENCOUNTER — Encounter: Payer: Self-pay | Admitting: Family Medicine

## 2015-11-02 VITALS — BP 132/90 | HR 72 | Temp 97.3°F | Resp 18 | Ht 63.0 in | Wt 166.0 lb

## 2015-11-02 DIAGNOSIS — G5602 Carpal tunnel syndrome, left upper limb: Secondary | ICD-10-CM

## 2015-11-02 DIAGNOSIS — R7303 Prediabetes: Secondary | ICD-10-CM | POA: Diagnosis not present

## 2015-11-02 DIAGNOSIS — G6289 Other specified polyneuropathies: Secondary | ICD-10-CM | POA: Diagnosis not present

## 2015-11-02 LAB — COMPLETE METABOLIC PANEL WITH GFR
ALK PHOS: 62 U/L (ref 33–130)
ALT: 22 U/L (ref 6–29)
AST: 27 U/L (ref 10–35)
Albumin: 4.5 g/dL (ref 3.6–5.1)
BILIRUBIN TOTAL: 0.4 mg/dL (ref 0.2–1.2)
BUN: 15 mg/dL (ref 7–25)
CO2: 28 mmol/L (ref 20–31)
CREATININE: 0.91 mg/dL (ref 0.50–0.99)
Calcium: 9.8 mg/dL (ref 8.6–10.4)
Chloride: 103 mmol/L (ref 98–110)
GFR, EST NON AFRICAN AMERICAN: 67 mL/min (ref >=60)
GFR, Est African American: 77 mL/min (ref >=60)
Glucose, Bld: 107 mg/dL — ABNORMAL HIGH (ref 70–99)
Potassium: 3.9 mmol/L (ref 3.5–5.3)
SODIUM: 141 mmol/L (ref 135–146)
Total Protein: 7.1 g/dL (ref 6.1–8.1)

## 2015-11-02 NOTE — Progress Notes (Signed)
Subjective:    Patient ID: Deborah Mullins, female    DOB: 1951/06/24, 65 y.o.   MRN: [id number]  HPI Lease see the patient's lab work from December. At that time she was found to have prediabetes with a hemoglobin A1c of 6.4. However she has been checking her sugars recently and discovered fasting blood sugars are greater than 130. She also reports worsening of her peripheral neuropathy. She reports burning and stinging pains in both feet and in both hands. She is seeing neurology who recommended she increase gabapentin to 800 mg in the morning and 300 mg at night which does seem to help with the pain. However she is concerned as am I, that the neuropathy may be worsening due to the worsening of her prediabetes. She is requesting a hemoglobin A1c which I believe is entirely appropriate. She also complains of numbness and tingling in her left hand. She states that every day she wakes up with her hand completely numb. It typically takes her 1-2 hours for the hand to slowly begin to wake up. She also complains of similar symptoms whenever she talks on the phone or whenever she drives her car. The pain is severe and radiates from her wrist all the way to her elbow. Her exam today however is rather benign. She has a positive Tinel sign but a negative Phalen sign. Muscle strength is 5 over 5 equal and symmetric in both arms. She has normal reflexes at the brachioradialis as well as the biceps tendon. She denies any neck pain Past Medical History  Diagnosis Date  . Hypertension   . Hyperlipidemia   . GERD (gastroesophageal reflux disease)   . Allergic rhinitis   . Lung nodule 2009  . PONV (postoperative nausea and vomiting)   . Shortness of breath     exertion  . Headache(784.0)   . Arthritis   . Anxiety   . Bipolar 1 disorder ([place])   . Depression   . Cancer ([place])     breast   . Lung cancer ([place])   . History of lung surgery   . Prediabetes     a1c 6.4 12/16   Past Surgical History  Procedure  Laterality Date  . Tubal ligation    . Tonsillectomy    . Cesarean section  1971  . Breast surgery  2005    lumpectomy - left  . Lung cancer surgery Right 05/23/2012    reportedly clear  . Carpel tunnel Right    Current Outpatient Prescriptions on File Prior to Visit  Medication Sig Dispense Refill  . albuterol (PROVENTIL HFA;VENTOLIN HFA) 108 (90 BASE) MCG/ACT inhaler Inhale 2 puffs into the lungs every 6 (six) hours as needed for wheezing or shortness of breath. 3 Inhaler 1  . EPINEPHrine 0.3 mg/0.3 mL IJ SOAJ injection Inject 0.3 mLs (0.3 mg total) into the muscle once. 1 Device 3  . FLUoxetine (PROZAC) 40 MG capsule Take 1 capsule (40 mg total) by mouth daily. 90 capsule 2  . gabapentin (NEURONTIN) 300 MG capsule Take 1 capsule (300 mg total) by mouth 3 (three) times daily. (Patient taking differently: Take 300 mg by mouth at bedtime. ) 90 capsule 11  . gabapentin (NEURONTIN) 800 MG tablet Take 1 tablet (800 mg total) by mouth at bedtime. (Patient taking differently: Take 800 mg by mouth every morning. ) 90 tablet 4  . hydrochlorothiazide (HYDRODIURIL) 25 MG tablet TAKE 1 TABLET BY MOUTH EVERY DAY 90 tablet 0  . pantoprazole (PROTONIX)  40 MG tablet TAKE 1 TABLET (40 MG TOTAL) BY MOUTH DAILY. 90 tablet 3  . polyethylene glycol powder (GLYCOLAX/MIRALAX) powder Take 17 g by mouth daily as needed (constipation). 850 g 0  . rosuvastatin (CRESTOR) 40 MG tablet TAKE 1 TABLET BY MOUTH EVERY DAY 90 tablet 0  . Naproxen (NAPROSYN PO) Take by mouth. Reported on 11/02/2015     No current facility-administered medications on file prior to visit.   Allergies  Allergen Reactions  . Bee Venom Anaphylaxis  . Penicillins Rash   Social History   Social History  . Marital Status: Married    Spouse Name: Eddie Dibbles  . Number of Children: 1  . Years of Education: 8   Occupational History  . retired     Location manager   Social History Main Topics  . Smoking status: Former Smoker -- 1.00  packs/day for 10 years    Types: Cigarettes    Quit date: 08/14/1974  . Smokeless tobacco: Never Used  . Alcohol Use: No  . Drug Use: Yes    Special: Marijuana     Comment: 07/20/15 last use 2 yrs ago, uses "once in a while"  . Sexual Activity: No   Other Topics Concern  . Not on file   Social History Narrative   Married.    Both pt and her husband have bipolar. She says he "Gets moody and leaves for a while" then comes back.    "Not very supportive."   Caffeine use- 2 cups/day      Review of Systems  All other systems reviewed and are negative.      Objective:   Physical Exam  Constitutional: She is oriented to person, place, and time.  Cardiovascular: Normal rate, regular rhythm and normal heart sounds.   Pulmonary/Chest: Effort normal and breath sounds normal.  Musculoskeletal: She exhibits no edema.  Neurological: She is alert and oriented to person, place, and time. She has normal reflexes. She displays normal reflexes. She exhibits normal muscle tone. Coordination normal.  Vitals reviewed.         Assessment & Plan:  Prediabetes - Plan: COMPLETE METABOLIC PANEL WITH GFR, Hemoglobin A1c  Other polyneuropathy (HCC)  Carpal tunnel syndrome, left - Plan: Ambulatory referral to Hand Surgery  Although her exam today is nonspecific, her history sounds like carpal tunnel syndrome. I will refer the patient to see a hand surgeon. I would recommend nerve conduction test and I know they can perform that through their office if necessary.  I am concerned her prediabetes may have developed into true diabetes mellitus type 2. I will check a hemoglobin A1c and if greater than 6.5 I'll start the patient on metformin.

## 2015-11-03 LAB — HEMOGLOBIN A1C
HEMOGLOBIN A1C: 6 % — AB (ref <5.7)
MEAN PLASMA GLUCOSE: 126 mg/dL — AB (ref <117)

## 2015-11-05 ENCOUNTER — Encounter: Payer: Self-pay | Admitting: Family Medicine

## 2015-11-20 ENCOUNTER — Other Ambulatory Visit: Payer: Self-pay | Admitting: Family Medicine

## 2015-11-25 ENCOUNTER — Telehealth (HOSPITAL_COMMUNITY): Payer: Self-pay | Admitting: *Deleted

## 2015-11-25 ENCOUNTER — Ambulatory Visit (HOSPITAL_COMMUNITY): Payer: Self-pay | Admitting: Psychiatry

## 2015-11-25 NOTE — Telephone Encounter (Signed)
Opened in Error.

## 2016-01-06 ENCOUNTER — Encounter (HOSPITAL_COMMUNITY): Payer: Self-pay | Admitting: *Deleted

## 2016-01-06 ENCOUNTER — Ambulatory Visit (HOSPITAL_COMMUNITY): Payer: Self-pay | Admitting: Psychiatry

## 2016-01-21 IMAGING — CT CT HEAD W/O CM
1 of 2 series · 16 of 30 positions shown, 20 images · non-contrast
Comparison: Head CT December 04, 2007

CLINICAL DATA: Dizziness today. History of breast cancer and lung
cancer.

EXAM:
CT HEAD WITHOUT CONTRAST
TECHNIQUE: Contiguous axial images were obtained from the base of the skull
through the vertex without intravenous contrast.

[Series 2: headseq 4.8 h37s · axial · 0.43mm/px · z∈[+76,+230]mm · 16 of 36 slices shown, 20 images]
[im 2/36  brain]
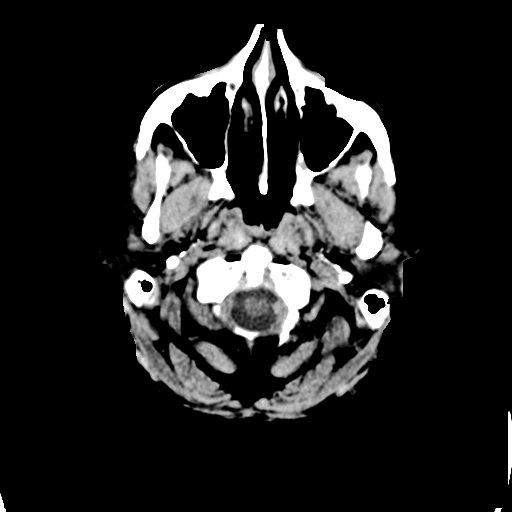
[im 2/36  bone]
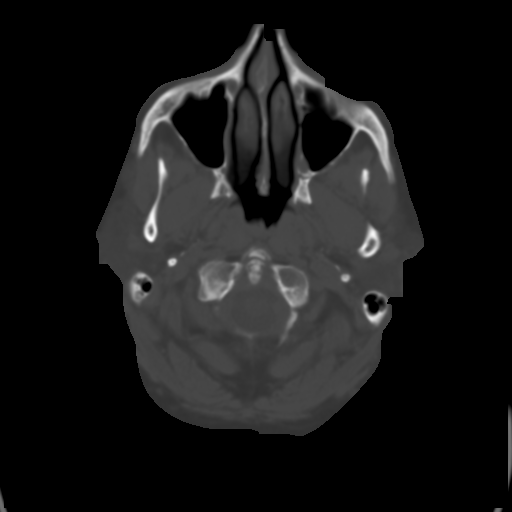
[im 5/36  brain]
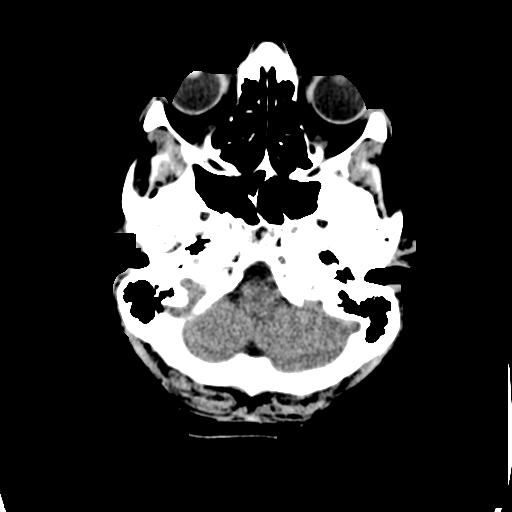
[im 6/36  brain]
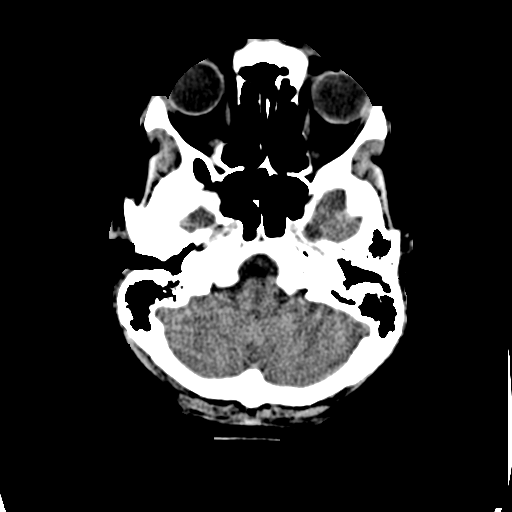
[im 9/36  brain]
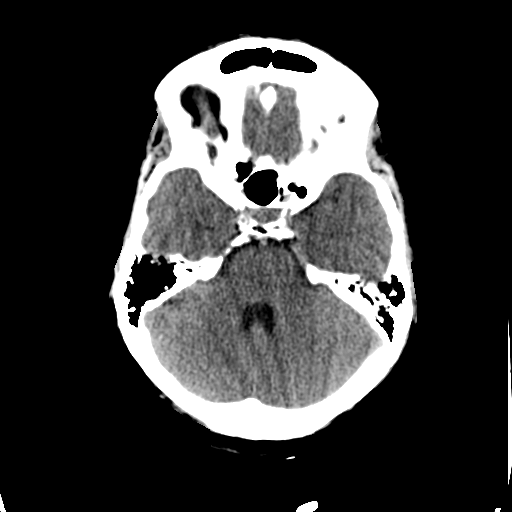
[im 10/36  brain]
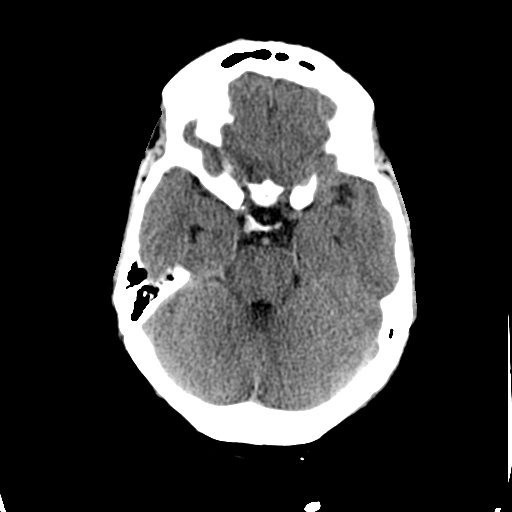
[im 10/36  bone]
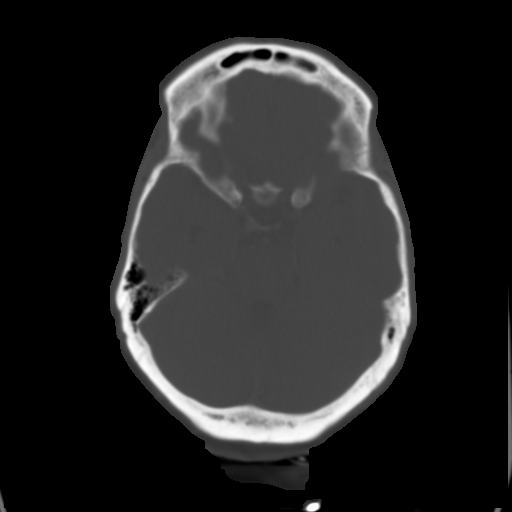
[im 13/36  brain]
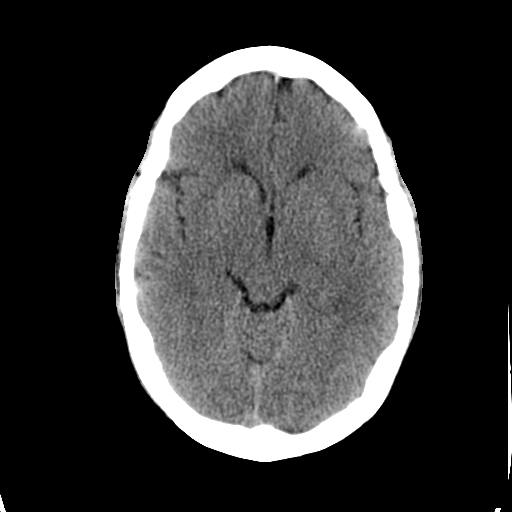
[im 15/36  brain]
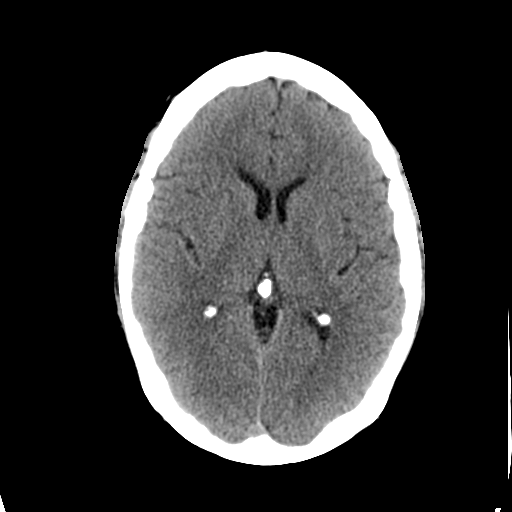
[im 17/36  brain]
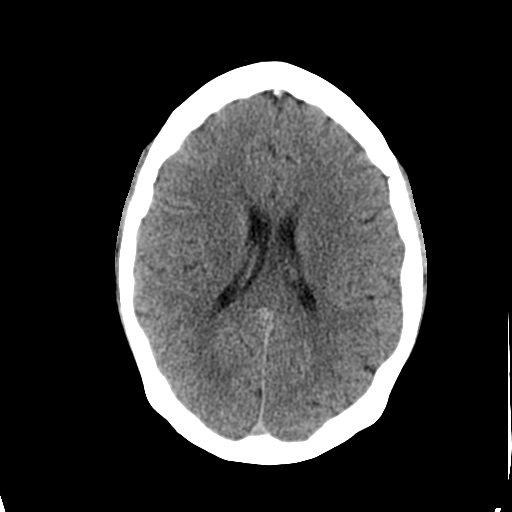
[im 19/36  brain]
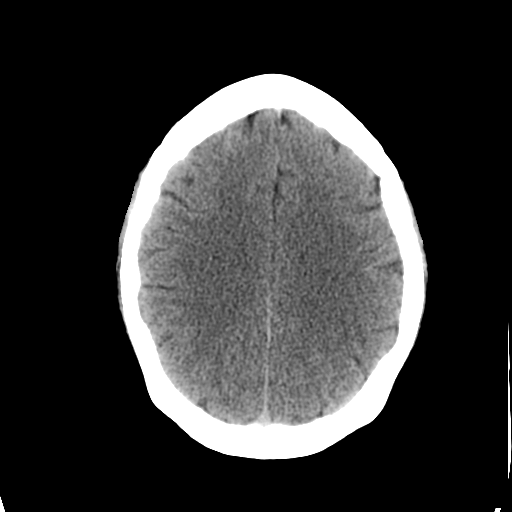
[im 19/36  bone]
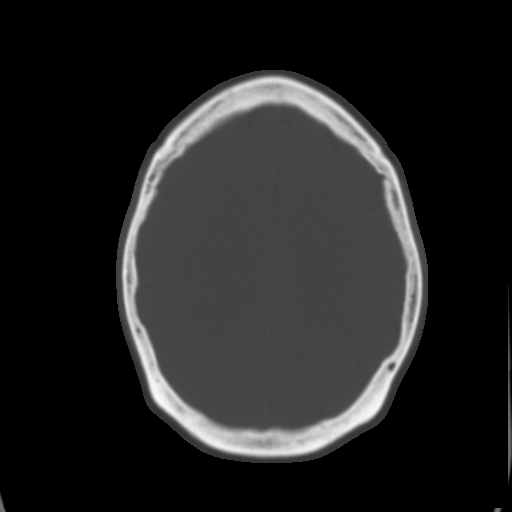
[im 22/36  brain]
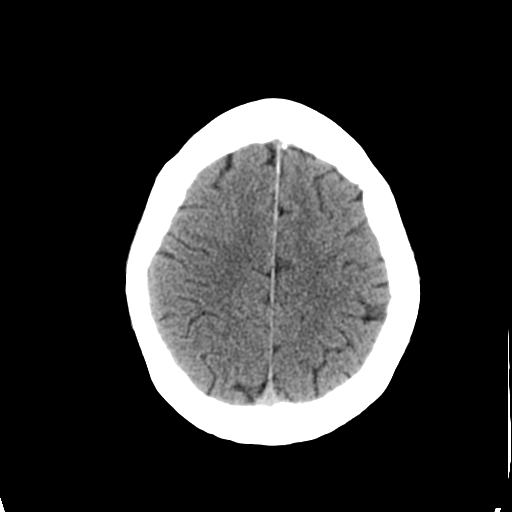
[im 23/36  brain]
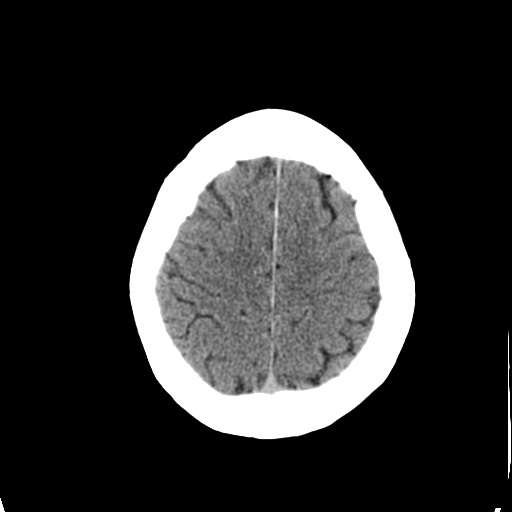
[im 26/36  brain]
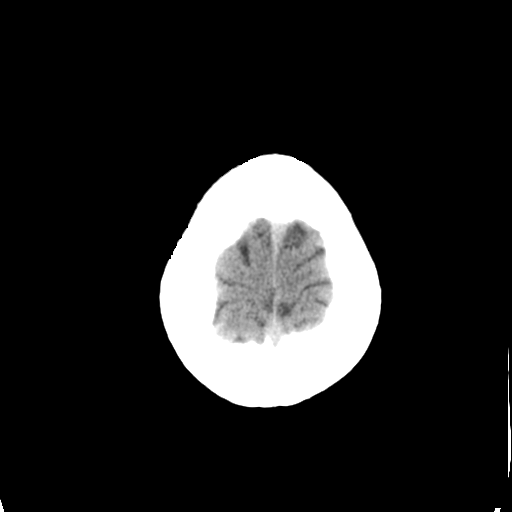
[im 27/36  brain]
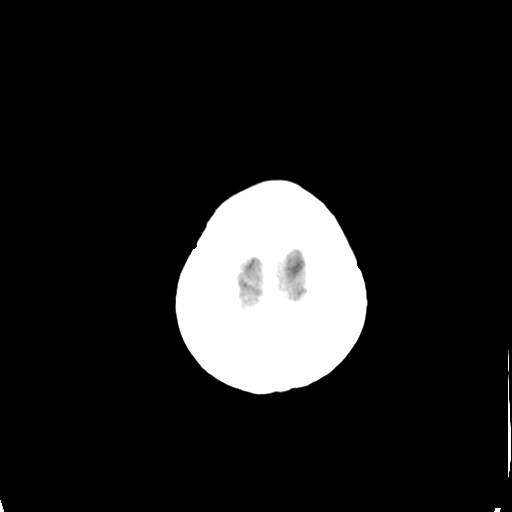
[im 27/36  bone]
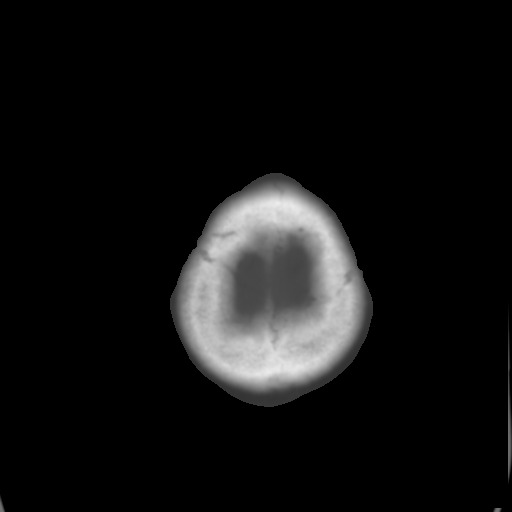
[im 30/36  brain]
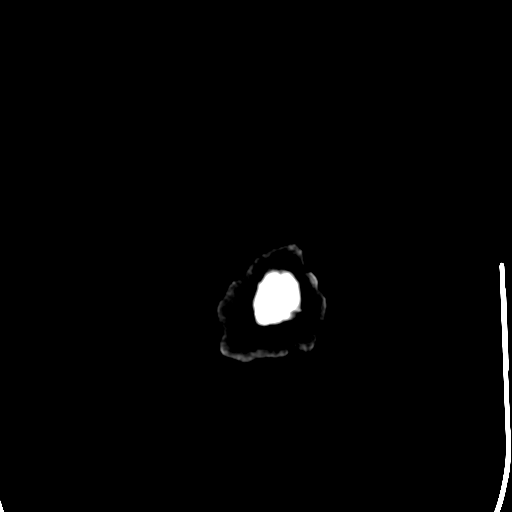
[im 31/36  brain]
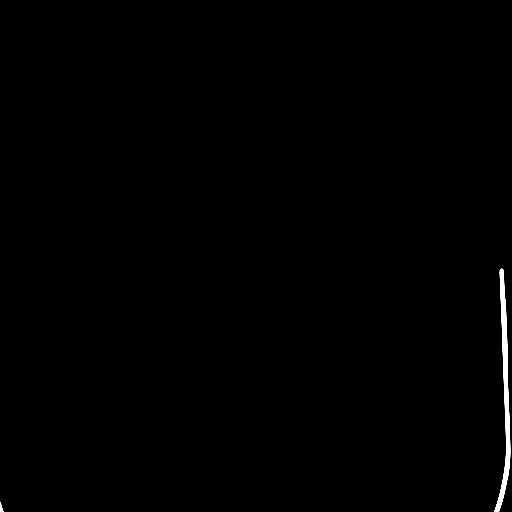
[im 34/36  brain]
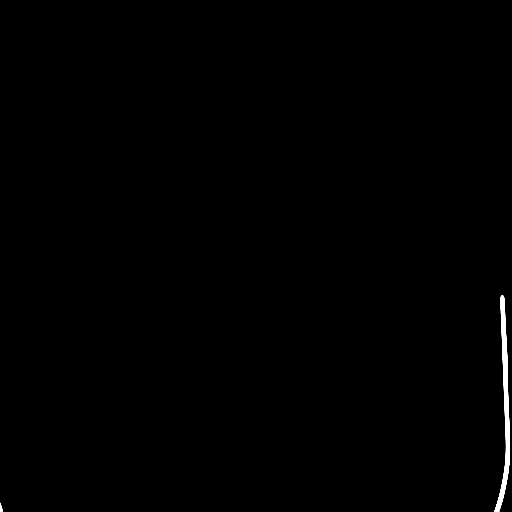

[16 of 30 positions shown; findings below may reference images not displayed]

FINDINGS: There is no midline shift, hydrocephalus, or mass. No acute
hemorrhage or acute transcortical infarct is identified. The bony
calvarium is intact. The visualized sinuses are clear.
IMPRESSION: No focal acute intracranial abnormality identified. No abnormal mass
or mass effect is noted.

## 2016-02-01 ENCOUNTER — Ambulatory Visit (HOSPITAL_COMMUNITY): Payer: 59 | Admitting: Psychiatry

## 2016-02-01 ENCOUNTER — Encounter (HOSPITAL_COMMUNITY): Payer: Self-pay | Admitting: *Deleted

## 2016-02-01 ENCOUNTER — Telehealth (HOSPITAL_COMMUNITY): Payer: Self-pay | Admitting: *Deleted

## 2016-02-01 NOTE — Telephone Encounter (Signed)
She will need to be discharged

## 2016-02-01 NOTE — Telephone Encounter (Signed)
Noted. Letter in mail to go out today to pt address on file.

## 2016-02-01 NOTE — Telephone Encounter (Signed)
Pt called on 01-26-16 to resch her appt with Dr. Harrington Challenger. Pt appt was then rescheduled for 02-01-16. At that point, pt was reminded of no show policy and pt verbalized understanding and stated she will come to appt. Per pt chart, on 08-27-15 was pt last appt she was seen for a f/u. Per pt chart, pt no showed for her 03-31-15, 07-22-15, 11-25-15 and 01-06-16. Please give advise as to if office should resch f/u appt for pt or should not resch appt for pt due to no show policy. Pt have not called office yet to resch appt for no show appt on 02-01-16.

## 2016-02-16 ENCOUNTER — Other Ambulatory Visit: Payer: Self-pay | Admitting: Family Medicine

## 2016-03-08 ENCOUNTER — Telehealth (HOSPITAL_COMMUNITY): Payer: Self-pay | Admitting: *Deleted

## 2016-03-08 NOTE — Telephone Encounter (Signed)
patient came to office today at 9:30 a.m. asking to schedule an appointment.   Patient was reminded that she had been discharged due to too many No Show appointments.  Patient's address and phone number was confirmed.   Also it was confirmed that patient did receive the discharge letter.   Patient said that she thought the letter was just BS.   Patient said she don't want to see anyone else, she wants to see Dr. Harrington Challenger.   Patient asked for copy of letter, letter was printed and given to patient.    Patient took letter and left office.

## 2016-04-04 ENCOUNTER — Other Ambulatory Visit (HOSPITAL_COMMUNITY): Payer: Self-pay | Admitting: Psychiatry

## 2016-04-12 ENCOUNTER — Telehealth: Payer: Self-pay | Admitting: Gastroenterology

## 2016-04-12 NOTE — Telephone Encounter (Signed)
Letter mailed

## 2016-04-12 NOTE — Telephone Encounter (Signed)
Pt is due 10 yr colonoscopy

## 2016-05-04 ENCOUNTER — Other Ambulatory Visit (HOSPITAL_COMMUNITY): Payer: Self-pay | Admitting: Psychiatry

## 2016-05-13 ENCOUNTER — Emergency Department (HOSPITAL_COMMUNITY): Payer: 59

## 2016-05-13 ENCOUNTER — Emergency Department (HOSPITAL_COMMUNITY)
Admission: EM | Admit: 2016-05-13 | Discharge: 2016-05-13 | Disposition: A | Discharge status: Home / Self Care | Payer: 59 | Attending: Emergency Medicine | Admitting: Emergency Medicine

## 2016-05-13 ENCOUNTER — Encounter (HOSPITAL_COMMUNITY): Payer: Self-pay | Admitting: Emergency Medicine

## 2016-05-13 DIAGNOSIS — Z853 Personal history of malignant neoplasm of breast: Secondary | ICD-10-CM | POA: Diagnosis not present

## 2016-05-13 DIAGNOSIS — I10 Essential (primary) hypertension: Secondary | ICD-10-CM | POA: Diagnosis not present

## 2016-05-13 DIAGNOSIS — M25512 Pain in left shoulder: Secondary | ICD-10-CM

## 2016-05-13 DIAGNOSIS — Z87891 Personal history of nicotine dependence: Secondary | ICD-10-CM | POA: Diagnosis not present

## 2016-05-13 DIAGNOSIS — M19012 Primary osteoarthritis, left shoulder: Secondary | ICD-10-CM | POA: Diagnosis not present

## 2016-05-13 DIAGNOSIS — J449 Chronic obstructive pulmonary disease, unspecified: Secondary | ICD-10-CM | POA: Diagnosis not present

## 2016-05-13 DIAGNOSIS — Z85118 Personal history of other malignant neoplasm of bronchus and lung: Secondary | ICD-10-CM | POA: Diagnosis not present

## 2016-05-13 DIAGNOSIS — Z79899 Other long term (current) drug therapy: Secondary | ICD-10-CM | POA: Diagnosis not present

## 2016-05-13 MED ORDER — HYDROCODONE-ACETAMINOPHEN 5-325 MG PO TABS
2.0000 | ORAL_TABLET | Freq: Once | ORAL | Status: AC
  Administered 2016-05-13: 2 via ORAL
  Filled 2016-05-13: qty 2

## 2016-05-13 MED ORDER — HYDROCODONE-ACETAMINOPHEN 5-325 MG PO TABS: 2.0000 | ORAL_TABLET | Freq: Once | ORAL | Status: DC

## 2016-05-13 MED ORDER — HYDROCODONE-ACETAMINOPHEN 5-325 MG PO TABS: 2.0000 | ORAL_TABLET | ORAL | 0 refills | Status: DC | PRN

## 2016-05-13 NOTE — ED Provider Notes (Signed)
[facility] DEPT Provider Note   CSN: [id number] Arrival date & time: 05/13/16  7341   By signing my name below, I, Macon Large and Hansel Feinstein, attest that this documentation has been prepared under the direction and in the presence of Helen Hashimoto, PA-C  Electronically Signed: Macon Large and Hansel Feinstein, ED Scribe. 05/13/16. 10:43 AM.  History   Chief Complaint Chief Complaint  Patient presents with  . Shoulder Pain    The history is provided by the patient. No language interpreter was used.    HPI Comments: Deborah Mullins is a 65 y.o. female who presents to the Emergency Department complaining of moderate, worsening left shoulder pain s/p heavy lifting yesterday. Pt states she was lifting logs in her yard prior to onset of her pain, but denies additional injuries, fall or trauma to the shoulder. Pt states she has taken Tylenol and naproxen for pain with temporary relief. Pt states she has h/o chronic left shoulder pain at baseline, and this pain was worsened yesterday. She states she has not previously been evaluated by orthopedics nor had an XR for evaluation of her ongoing shoulder pain. PCP is Dr. Dennard Schaumann. Pt reports recent h/o right carpal tunnel surgery performed by hand surgery. She denies numbness.     Pain medication  Past Medical History:  Diagnosis Date  . Allergic rhinitis   . Anxiety   . Arthritis   . Bipolar 1 disorder ([place])   . Cancer ([place])    breast   . Depression   . GERD (gastroesophageal reflux disease)   . Headache(784.0)   . History of lung surgery   . Hyperlipidemia   . Hypertension   . Lung cancer ([place])   . Lung nodule 2009  . PONV (postoperative nausea and vomiting)   . Prediabetes    a1c 6.4 12/16  . Shortness of breath    exertion    Patient Active Problem List   Diagnosis Date Noted  . Prediabetes   . Anxiety   . Bipolar 1 disorder ([place])   . Depression   . Chronic back pain 11/07/2012  . Insomnia secondary to  depression with anxiety 09/23/2012  . Unspecified vitamin D deficiency 09/23/2012  . COPD, mild ([place]) 07/01/2012  . Acute bronchitis 06/08/2012  . Adenocarcinoma of lung, stage 1 ([place]) 05/23/2012  . RECTAL BLEEDING 04/27/2010  . HEMORRHOIDS 03/11/2009  . ADENOCARCINOMA, BREAST 12/15/2008  . FIBROIDS, UTERUS 12/15/2008  . HYPERCHOLESTEROLEMIA 12/15/2008  . HYPERLIPIDEMIA 12/15/2008  . Anxiety and depression 12/15/2008  . GASTROESOPHAGEAL REFLUX DISEASE 12/15/2008  . GASTRITIS 12/15/2008  . CONSTIPATION 12/15/2008  . FLATULENCE ERUCTATION AND GAS PAIN 12/15/2008  . ABDOMINAL PAIN 12/15/2008  . RECTAL BLEEDING, HX OF 12/15/2008    Past Surgical History:  Procedure Laterality Date  . BREAST SURGERY  2005   lumpectomy - left  . carpel tunnel Right   . CESAREAN SECTION  1971  . LUNG CANCER SURGERY Right 05/23/2012   reportedly clear  . TONSILLECTOMY    . TUBAL LIGATION      OB History    Gravida Para Term Preterm AB Living   '1 1 1     1   '$ SAB TAB Ectopic Multiple Live Births                   Home Medications    Prior to Admission medications   Medication Sig Start Date End Date Taking? Authorizing Provider  albuterol (PROVENTIL HFA;VENTOLIN HFA) 108 (90 BASE) MCG/ACT  inhaler Inhale 2 puffs into the lungs every 6 (six) hours as needed for wheezing or shortness of breath. 06/16/14   Susy Frizzle, MD  EPINEPHrine 0.3 mg/0.3 mL IJ SOAJ injection Inject 0.3 mLs (0.3 mg total) into the muscle once. 11/30/14   Susy Frizzle, MD  FLUoxetine (PROZAC) 40 MG capsule Take 1 capsule (40 mg total) by mouth daily. 08/27/15 08/26/16  Cloria Spring, MD  gabapentin (NEURONTIN) 300 MG capsule Take 1 capsule (300 mg total) by mouth 3 (three) times daily. Patient taking differently: Take 300 mg by mouth at bedtime.  08/02/15   Penni Bombard, MD  gabapentin (NEURONTIN) 800 MG tablet Take 1 tablet (800 mg total) by mouth at bedtime. Patient taking differently: Take 800 mg by mouth  every morning.  10/27/15   Penni Bombard, MD  hydrochlorothiazide (HYDRODIURIL) 25 MG tablet TAKE 1 TABLET BY MOUTH EVERY DAY 11/22/15   Susy Frizzle, MD  Naproxen (NAPROSYN PO) Take by mouth. Reported on 11/02/2015    Historical Provider, MD  pantoprazole (PROTONIX) 40 MG tablet TAKE 1 TABLET (40 MG TOTAL) BY MOUTH DAILY. 05/17/15   Susy Frizzle, MD  polyethylene glycol powder (GLYCOLAX/MIRALAX) powder Take 17 g by mouth daily as needed (constipation). 02/05/13   Darrol Jump, MD  rosuvastatin (CRESTOR) 40 MG tablet TAKE 1 TABLET BY MOUTH EVERY DAY 02/16/16   Susy Frizzle, MD    Family History Family History  Problem Relation Age of Onset  . Heart disease Mother     CHF  . Physical abuse Mother   . Depression Mother   . Cancer Sister   . ADD / ADHD Sister   . Alcohol abuse Father   . Alcohol abuse Brother   . Mental illness Brother   . Cancer Sister   . Heart disease Sister   . Physical abuse Sister   . OCD Sister   . Mental illness Sister   . Anxiety disorder Neg Hx   . Bipolar disorder Neg Hx   . Dementia Neg Hx   . Drug abuse Neg Hx   . Paranoid behavior Neg Hx   . Schizophrenia Neg Hx   . Seizures Neg Hx   . Sexual abuse Neg Hx     Social History Social History  Substance Use Topics  . Smoking status: Former Smoker    Packs/day: 1.00    Years: 10.00    Types: Cigarettes    Quit date: 08/14/1974  . Smokeless tobacco: Never Used  . Alcohol use No     Allergies   Bee venom and Penicillins   Review of Systems Review of Systems  Musculoskeletal: Positive for myalgias (left shoulder).  Neurological: Negative for numbness.  All other systems reviewed and are negative.    Physical Exam Updated Vital Signs BP (!) 149/106 (BP Location: Right Arm)   Pulse 75   Temp 97.3 F (36.3 C) (Oral)   Resp 18   Ht '5\' 3"'$  (1.6 m)   Wt 160 lb (72.6 kg)   SpO2 94%   BMI 28.34 kg/m   Physical Exam  Constitutional: She appears well-developed and  well-nourished. No distress.  HENT:  Head: Normocephalic and atraumatic.  Eyes: Conjunctivae are normal.  Neck: Neck supple.  Cardiovascular: Normal rate and regular rhythm.   No murmur heard. Pulmonary/Chest: Effort normal and breath sounds normal. No respiratory distress.  Abdominal: Soft. There is no tenderness.  Musculoskeletal: She exhibits tenderness. She exhibits no edema.  Tenderness to left shoulder with guarding. Radial pulses intact.   Neurological: She is alert.  Skin: Skin is warm and dry.  Psychiatric: She has a normal mood and affect.  Nursing note and vitals reviewed.    ED Treatments / Results  DIAGNOSTIC STUDIES: Oxygen Saturation is 94% on RA , adequate by my interpretation.    COORDINATION OF CARE: 10:23 AM Discussed treatment plan with pt at bedside which includes XR, orthopedic f/u and pt agreed to plan.  Labs (all labs ordered are listed, but only abnormal results are displayed) Labs Reviewed - No data to display  EKG  EKG Interpretation None       Radiology Dg Shoulder Left  Result Date: 05/13/2016 CLINICAL DATA:  66 year old female with history of chronic left-sided shoulder pain worsening today. EXAM: LEFT SHOULDER - 2+ VIEW COMPARISON:  No priors. FINDINGS: No acute displaced fractures. Joint space narrowing, subchondral sclerosis and osteophyte formation are noted in the glenohumeral joint, with abundant osteophytosis along the inferior aspect of the left femoral head/neck. The possibility of remote trauma in this region is not excluded, but no acute injury is noted. IMPRESSION: 1. No acute radiographic abnormalities of the left shoulder. 2. Severe chronic degenerative changes of osteoarthritis in the left glenohumeral joint, as above. Electronically Signed   By: Vinnie Langton M.D.   On: 05/13/2016 11:04    Procedures Procedures (including critical care time)  Medications Ordered in ED Medications  HYDROcodone-acetaminophen (NORCO/VICODIN)  5-325 MG per tablet 2 tablet (2 tablets Oral Given 05/13/16 1037)     Initial Impression / Assessment and Plan / ED Course  I have reviewed the triage vital signs and the nursing notes.  Pertinent labs & imaging results that were available during my care of the patient were reviewed by me and considered in my medical decision making (see chart for details).  Clinical Course    Pt counseled on xray results.  Pt advised to follow up with her Orthopaedist Dr. Ninfa Linden for recheck.    Final Clinical Impressions(s) / ED Diagnoses   Final diagnoses:  Shoulder pain, acute, left  Primary osteoarthritis of left shoulder    New Prescriptions Discharge Medication List as of 05/13/2016 11:44 AM    START taking these medications   Details  HYDROcodone-acetaminophen (NORCO/VICODIN) 5-325 MG tablet Take 2 tablets by mouth every 4 (four) hours as needed., Starting Sat 05/13/2016, Print       An After Visit Summary was printed and given to the patient.    Hollace Kinnier [place], PA-C 05/13/16 [place], MD 05/13/16 [phone]

## 2016-05-13 NOTE — ED Triage Notes (Signed)
Patient c/o left shoulder pain. Per patient aggravated shoulder moving logs of wood yesterday. Patient reports that she has been told that she needs surgery on rotator cuff. Per patient pain worse with movement. Denies any chest pain at this time but states she occasionally has intermittent chest pain with excretion and shortness of breath.

## 2016-05-13 NOTE — Discharge Instructions (Signed)
Return if any problems.  See the Orthopaedist for evaluation

## 2016-05-18 ENCOUNTER — Other Ambulatory Visit (HOSPITAL_COMMUNITY): Payer: Self-pay | Admitting: Psychiatry

## 2016-05-31 ENCOUNTER — Other Ambulatory Visit: Payer: Self-pay | Admitting: Family Medicine

## 2016-06-26 ENCOUNTER — Encounter (HOSPITAL_COMMUNITY): Payer: Self-pay | Admitting: Emergency Medicine

## 2016-06-26 ENCOUNTER — Emergency Department (HOSPITAL_COMMUNITY)
Admission: EM | Admit: 2016-06-26 | Discharge: 2016-06-26 | Disposition: A | Discharge status: Home / Self Care | Payer: 59 | Attending: Emergency Medicine | Admitting: Emergency Medicine

## 2016-06-26 ENCOUNTER — Ambulatory Visit (INDEPENDENT_AMBULATORY_CARE_PROVIDER_SITE_OTHER): Payer: 59 | Admitting: Orthopaedic Surgery

## 2016-06-26 ENCOUNTER — Emergency Department (HOSPITAL_COMMUNITY): Payer: 59

## 2016-06-26 DIAGNOSIS — Y999 Unspecified external cause status: Secondary | ICD-10-CM | POA: Diagnosis not present

## 2016-06-26 DIAGNOSIS — M25512 Pain in left shoulder: Secondary | ICD-10-CM

## 2016-06-26 DIAGNOSIS — M25511 Pain in right shoulder: Secondary | ICD-10-CM

## 2016-06-26 DIAGNOSIS — W208XXA Other cause of strike by thrown, projected or falling object, initial encounter: Secondary | ICD-10-CM | POA: Diagnosis not present

## 2016-06-26 DIAGNOSIS — Z79899 Other long term (current) drug therapy: Secondary | ICD-10-CM | POA: Diagnosis not present

## 2016-06-26 DIAGNOSIS — Z85118 Personal history of other malignant neoplasm of bronchus and lung: Secondary | ICD-10-CM | POA: Diagnosis not present

## 2016-06-26 DIAGNOSIS — Z87891 Personal history of nicotine dependence: Secondary | ICD-10-CM | POA: Insufficient documentation

## 2016-06-26 DIAGNOSIS — Y9389 Activity, other specified: Secondary | ICD-10-CM | POA: Insufficient documentation

## 2016-06-26 DIAGNOSIS — J449 Chronic obstructive pulmonary disease, unspecified: Secondary | ICD-10-CM | POA: Diagnosis not present

## 2016-06-26 DIAGNOSIS — S9031XA Contusion of right foot, initial encounter: Secondary | ICD-10-CM | POA: Insufficient documentation

## 2016-06-26 DIAGNOSIS — Z853 Personal history of malignant neoplasm of breast: Secondary | ICD-10-CM | POA: Diagnosis not present

## 2016-06-26 DIAGNOSIS — Y9259 Other trade areas as the place of occurrence of the external cause: Secondary | ICD-10-CM | POA: Diagnosis not present

## 2016-06-26 DIAGNOSIS — S99921A Unspecified injury of right foot, initial encounter: Secondary | ICD-10-CM | POA: Diagnosis present

## 2016-06-26 DIAGNOSIS — G8929 Other chronic pain: Secondary | ICD-10-CM | POA: Diagnosis not present

## 2016-06-26 DIAGNOSIS — I10 Essential (primary) hypertension: Secondary | ICD-10-CM | POA: Insufficient documentation

## 2016-06-26 MED ORDER — HYDROCODONE-ACETAMINOPHEN 5-325 MG PO TABS: 1.0000 | ORAL_TABLET | ORAL | 0 refills | Status: DC | PRN

## 2016-06-26 MED ORDER — HYDROCODONE-ACETAMINOPHEN 5-325 MG PO TABS
1.0000 | ORAL_TABLET | Freq: Once | ORAL | Status: AC
  Administered 2016-06-26: 1 via ORAL
  Filled 2016-06-26: qty 1

## 2016-06-26 MED ORDER — TRAMADOL HCL 50 MG PO TABS
50.0000 mg | ORAL_TABLET | Freq: Once | ORAL | Status: DC
  Filled 2016-06-26: qty 1

## 2016-06-26 MED ORDER — IBUPROFEN 800 MG PO TABS
800.0000 mg | ORAL_TABLET | Freq: Three times a day (TID) | ORAL | 3 refills | Status: DC | PRN
Start: 2016-06-26 — End: 2016-08-15

## 2016-06-26 NOTE — Progress Notes (Signed)
Office Visit Note   Patient: Deborah Mullins           Date of Birth: [birth year]           MRN: [id number] Visit Date: 06/26/2016              Requested by: Susy Frizzle, MD [address] [facility] [place], [place] [address] PCP: Odette Fraction, MD   Assessment & Plan: Visit Diagnoses:  1. Chronic pain of both shoulders     Plan: At this point I would like to set her up for intra-articular steroid injections by Dr. Ernestina Patches under direct fluoroscopy of both of her shoulders.  Follow-Up Instructions: Return in about 4 weeks (around 07/24/2016).   Orders:  No orders of the defined types were placed in this encounter.  Meds ordered this encounter  Medications  . ibuprofen (ADVIL,MOTRIN) 800 MG tablet    Sig: Take 1 tablet (800 mg total) by mouth every 8 (eight) hours as needed.    Dispense:  90 tablet    Refill:  3      Procedures: No procedures performed   Clinical Data: No additional findings.   Subjective: Chief Complaint  Patient presents with  . Left Hand - Follow-up    12/30/15 left CTR--Doing "ok"  . Left Shoulder - Pain    Patient c/o bilateral severe shoulder pain. States she went to the ER recently and thye obtained xrays and told her she did have severe arthritis in the shoulders-she would like to discuss being referred to rheumatologist  . Right Shoulder - Pain   She reports that both shoulders a been significantly sore. She is not able to reach behind her overhead well. She denies any injuries to the shoulders. She says her sister who lives across country just had a shoulder replacement. Her pain is 10 out of 10 with activities. It does wake her up at night. Both shoulder she says her weak. HPI  Review of Systems She denies any headache, chest pain, shortness of breath, fever, chills, nausea, vomiting  Objective: Vital Signs: There were no vitals taken for this visit.  Physical Exam She is alert and oriented 3 in no acute distress Ortho  Exam Both shoulder show significant grind of the glenohumeral joint. She is primarily her her deltoid muscles to abduct her shoulders. She has significant limited mobility of both shoulders. I can passively bring her to at least 90 on both shoulders but is very painful. Specialty Comments:  No specialty comments available.  Imaging: No results found. X-rays on the cone system of right shoulder show significant glenohumeral arthritis. The humeral head is not high riding.  PMFS History: Patient Active Problem List   Diagnosis Date Noted  . Prediabetes   . Anxiety   . Bipolar 1 disorder ([place])   . Depression   . Chronic back pain 11/07/2012  . Insomnia secondary to depression with anxiety 09/23/2012  . Unspecified vitamin D deficiency 09/23/2012  . COPD, mild ([place]) 07/01/2012  . Acute bronchitis 06/08/2012  . Adenocarcinoma of lung, stage 1 ([place]) 05/23/2012  . RECTAL BLEEDING 04/27/2010  . HEMORRHOIDS 03/11/2009  . ADENOCARCINOMA, BREAST 12/15/2008  . FIBROIDS, UTERUS 12/15/2008  . HYPERCHOLESTEROLEMIA 12/15/2008  . HYPERLIPIDEMIA 12/15/2008  . Anxiety and depression 12/15/2008  . GASTROESOPHAGEAL REFLUX DISEASE 12/15/2008  . GASTRITIS 12/15/2008  . CONSTIPATION 12/15/2008  . FLATULENCE ERUCTATION AND GAS PAIN 12/15/2008  . ABDOMINAL PAIN 12/15/2008  . RECTAL BLEEDING, HX OF 12/15/2008  Past Medical History:  Diagnosis Date  . Allergic rhinitis   . Anxiety   . Arthritis   . Bipolar 1 disorder ([place])   . Cancer ([place])    breast   . Depression   . GERD (gastroesophageal reflux disease)   . Headache(784.0)   . History of lung surgery   . Hyperlipidemia   . Hypertension   . Lung cancer ([place])   . Lung nodule 2009  . PONV (postoperative nausea and vomiting)   . Prediabetes    a1c 6.4 12/16  . Shortness of breath    exertion    Family History  Problem Relation Age of Onset  . Heart disease Mother     CHF  . Physical abuse Mother   . Depression Mother   . Cancer  Sister   . ADD / ADHD Sister   . Alcohol abuse Father   . Alcohol abuse Brother   . Mental illness Brother   . Cancer Sister   . Heart disease Sister   . Physical abuse Sister   . OCD Sister   . Mental illness Sister   . Anxiety disorder Neg Hx   . Bipolar disorder Neg Hx   . Dementia Neg Hx   . Drug abuse Neg Hx   . Paranoid behavior Neg Hx   . Schizophrenia Neg Hx   . Seizures Neg Hx   . Sexual abuse Neg Hx     Past Surgical History:  Procedure Laterality Date  . BREAST SURGERY  2005   lumpectomy - left  . carpel tunnel Right   . CESAREAN SECTION  1971  . LUNG CANCER SURGERY Right 05/23/2012   reportedly clear  . TONSILLECTOMY    . TUBAL LIGATION     Social History   Occupational History  . retired     Location manager   Social History Main Topics  . Smoking status: Former Smoker    Packs/day: 1.00    Years: 10.00    Types: Cigarettes    Quit date: 08/14/1974  . Smokeless tobacco: Never Used  . Alcohol use No  . Drug use: No  . Sexual activity: No

## 2016-06-26 NOTE — Addendum Note (Signed)
Addended by: Jacklyn Shell on: 06/26/2016 04:27 PM   Modules accepted: Orders

## 2016-06-26 NOTE — ED Triage Notes (Signed)
Pt reports a large candle slipped off a shelf at [facility] and hit her R foot. Pt states she has had increasing pain throughout the day.

## 2016-06-26 NOTE — Discharge Instructions (Signed)
You may take the hydrocodone prescribed for pain relief.  This will make you drowsy - do not drive within 4 hours of taking this medication.  Ice and elevate as discussed for the next 2 days,  you may add heat therapy starting on Thursday.  Your xrays are negative for fracture.

## 2016-06-27 NOTE — ED Provider Notes (Signed)
[facility] DEPT Provider Note   CSN: [id number] Arrival date & time: 06/26/16  2018     History   Chief Complaint Chief Complaint  Patient presents with  . Foot Injury    HPI Deborah Mullins is a 65 y.o. female presenting for evaluation of right foot injury. She was shopping when she dropped a large glass candle jar on her right foot about 5 hours before arrival.  She had pain which receded and was tolerable until she had a severe acute recurrence of pain which has been persistent for the past hour.  She was standing in her kitchen when the exacerbation began and has had difficulty weight bearing since.  She took ibuprofen 800 mg prior to arrival with no improvement.  The history is provided by the patient.    Past Medical History:  Diagnosis Date  . Allergic rhinitis   . Anxiety   . Arthritis   . Bipolar 1 disorder ([place])   . Cancer ([place])    breast   . Depression   . GERD (gastroesophageal reflux disease)   . Headache(784.0)   . History of lung surgery   . Hyperlipidemia   . Hypertension   . Lung cancer ([place])   . Lung nodule 2009  . PONV (postoperative nausea and vomiting)   . Prediabetes    a1c 6.4 12/16  . Shortness of breath    exertion    Patient Active Problem List   Diagnosis Date Noted  . Prediabetes   . Anxiety   . Bipolar 1 disorder ([place])   . Depression   . Chronic back pain 11/07/2012  . Insomnia secondary to depression with anxiety 09/23/2012  . Unspecified vitamin D deficiency 09/23/2012  . COPD, mild ([place]) 07/01/2012  . Acute bronchitis 06/08/2012  . Adenocarcinoma of lung, stage 1 ([place]) 05/23/2012  . RECTAL BLEEDING 04/27/2010  . HEMORRHOIDS 03/11/2009  . ADENOCARCINOMA, BREAST 12/15/2008  . FIBROIDS, UTERUS 12/15/2008  . HYPERCHOLESTEROLEMIA 12/15/2008  . HYPERLIPIDEMIA 12/15/2008  . Anxiety and depression 12/15/2008  . GASTROESOPHAGEAL REFLUX DISEASE 12/15/2008  . GASTRITIS 12/15/2008  . CONSTIPATION 12/15/2008  . FLATULENCE  ERUCTATION AND GAS PAIN 12/15/2008  . ABDOMINAL PAIN 12/15/2008  . RECTAL BLEEDING, HX OF 12/15/2008    Past Surgical History:  Procedure Laterality Date  . BREAST SURGERY  2005   lumpectomy - left  . carpel tunnel Right   . CESAREAN SECTION  1971  . LUNG CANCER SURGERY Right 05/23/2012   reportedly clear  . TONSILLECTOMY    . TUBAL LIGATION      OB History    Gravida Para Term Preterm AB Living   '1 1 1     1   '$ SAB TAB Ectopic Multiple Live Births                   Home Medications    Prior to Admission medications   Medication Sig Start Date End Date Taking? Authorizing Provider  albuterol (PROVENTIL HFA;VENTOLIN HFA) 108 (90 BASE) MCG/ACT inhaler Inhale 2 puffs into the lungs every 6 (six) hours as needed for wheezing or shortness of breath. 06/16/14   Susy Frizzle, MD  EPINEPHrine 0.3 mg/0.3 mL IJ SOAJ injection Inject 0.3 mLs (0.3 mg total) into the muscle once. 11/30/14   Susy Frizzle, MD  FLUoxetine (PROZAC) 40 MG capsule Take 1 capsule (40 mg total) by mouth daily. 08/27/15 08/26/16  Cloria Spring, MD  gabapentin (NEURONTIN) 300 MG capsule Take 1 capsule (300  mg total) by mouth 3 (three) times daily. Patient taking differently: Take 600 mg by mouth at bedtime.  08/02/15   Penni Bombard, MD  gabapentin (NEURONTIN) 800 MG tablet Take 1 tablet (800 mg total) by mouth at bedtime. Patient taking differently: Take 800 mg by mouth every morning.  10/27/15   Penni Bombard, MD  hydrochlorothiazide (HYDRODIURIL) 25 MG tablet TAKE 1 TABLET BY MOUTH EVERY DAY 11/22/15   Susy Frizzle, MD  HYDROcodone-acetaminophen (NORCO/VICODIN) 5-325 MG tablet Take 1 tablet by mouth every 4 (four) hours as needed. 06/26/16   Evalee Jefferson, PA-C  ibuprofen (ADVIL,MOTRIN) 800 MG tablet Take 1 tablet (800 mg total) by mouth every 8 (eight) hours as needed. 06/26/16   Mcarthur Rossetti, MD  pantoprazole (PROTONIX) 40 MG tablet TAKE 1 TABLET (40 MG TOTAL) BY MOUTH DAILY. 05/17/15    Susy Frizzle, MD  polyethylene glycol powder (GLYCOLAX/MIRALAX) powder Take 17 g by mouth daily as needed (constipation). 02/05/13   Darrol Jump, MD  rosuvastatin (CRESTOR) 40 MG tablet TAKE 1 TABLET BY MOUTH EVERY DAY 05/31/16   Susy Frizzle, MD    Family History Family History  Problem Relation Age of Onset  . Heart disease Mother     CHF  . Physical abuse Mother   . Depression Mother   . Cancer Sister   . ADD / ADHD Sister   . Alcohol abuse Father   . Alcohol abuse Brother   . Mental illness Brother   . Cancer Sister   . Heart disease Sister   . Physical abuse Sister   . OCD Sister   . Mental illness Sister   . Anxiety disorder Neg Hx   . Bipolar disorder Neg Hx   . Dementia Neg Hx   . Drug abuse Neg Hx   . Paranoid behavior Neg Hx   . Schizophrenia Neg Hx   . Seizures Neg Hx   . Sexual abuse Neg Hx     Social History Social History  Substance Use Topics  . Smoking status: Former Smoker    Packs/day: 1.00    Years: 10.00    Types: Cigarettes    Quit date: 08/14/1974  . Smokeless tobacco: Never Used  . Alcohol use No     Allergies   Bee venom and Penicillins   Review of Systems Review of Systems  Musculoskeletal: Positive for arthralgias and joint swelling.  Skin: Negative for color change and wound.  Neurological: Negative for weakness and numbness.     Physical Exam Updated Vital Signs BP 149/89 (BP Location: Right Arm)   Pulse 95   Temp 98.2 F (36.8 C) (Tympanic)   Resp 18   Ht '5\' 3"'$  (1.6 m)   Wt 74.8 kg   SpO2 97%   BMI 29.23 kg/m   Physical Exam  Constitutional: She appears well-developed and well-nourished.  HENT:  Head: Normocephalic.  Cardiovascular: Normal rate and intact distal pulses.  Exam reveals no decreased pulses.   Pulses:      Dorsalis pedis pulses are 2+ on the right side, and 2+ on the left side.       Posterior tibial pulses are 2+ on the right side, and 2+ on the left side.  Musculoskeletal: She exhibits  edema and tenderness.       Right ankle: She exhibits decreased range of motion. She exhibits no swelling, no ecchymosis and normal pulse. No tenderness. No lateral malleolus, no head of 5th metatarsal and no  proximal fibula tenderness found. Achilles tendon normal.       Right foot: There is decreased range of motion, tenderness and swelling. There is normal capillary refill and no deformity.       Feet:  Edema noted right lateral proximal foot. No bruising.  Distal sensation intact. Decreased ROM of toes secondary to pain. Less than 2 sec cap refill.   Neurological: She is alert. No sensory deficit.  Skin: Skin is warm, dry and intact.  Nursing note and vitals reviewed.    ED Treatments / Results  Labs (all labs ordered are listed, but only abnormal results are displayed) Labs Reviewed - No data to display  EKG  EKG Interpretation None       Radiology Dg Foot Complete Right  Result Date: 06/26/2016 CLINICAL DATA:  Candle fell onto right foot, with dorsal right foot pain. Initial encounter. EXAM: RIGHT FOOT COMPLETE - 3+ VIEW COMPARISON:  Right foot radiographs performed 11/30/2014 FINDINGS: There is no evidence of fracture or dislocation. The joint spaces are preserved. There is no evidence of talar subluxation; the subtalar joint is unremarkable in appearance. Mild degenerative change is noted at the base of the second metatarsal. A small os naviculare is noted. An os peroneum is seen. No significant soft tissue abnormalities are seen. IMPRESSION: 1. No evidence of fracture or dislocation. 2. Os peroneum noted.  Small os naviculare seen. Electronically Signed   By: Garald Balding M.D.   On: 06/26/2016 21:03    Procedures Procedures (including critical care time)  Medications Ordered in ED Medications  HYDROcodone-acetaminophen (NORCO/VICODIN) 5-325 MG per tablet 1 tablet (1 tablet Oral Given 06/26/16 2210)     Initial Impression / Assessment and Plan / ED Course  I have  reviewed the triage vital signs and the nursing notes.  Pertinent labs & imaging results that were available during my care of the patient were reviewed by me and considered in my medical decision making (see chart for details).  Clinical Course     Ace, RICE, ibuprofen. Prn f/u anticipated. F/u with pcp prn.  Final Clinical Impressions(s) / ED Diagnoses   Final diagnoses:  Contusion of right foot, initial encounter    New Prescriptions Discharge Medication List as of 06/26/2016 10:06 PM       Evalee Jefferson, PA-C 06/27/16 [place], MD 06/27/16 [phone]

## 2016-06-29 ENCOUNTER — Telehealth: Payer: Self-pay

## 2016-06-29 NOTE — Telephone Encounter (Signed)
[phone] OR [phone]  PATIENT NEEDS TRIAGE FOR TCS

## 2016-06-30 NOTE — Telephone Encounter (Signed)
LMOM to call.

## 2016-07-03 ENCOUNTER — Telehealth: Payer: Self-pay

## 2016-07-03 NOTE — Telephone Encounter (Signed)
See separate triage.  

## 2016-07-11 ENCOUNTER — Encounter (HOSPITAL_COMMUNITY): Payer: Self-pay | Admitting: Emergency Medicine

## 2016-07-11 ENCOUNTER — Emergency Department (HOSPITAL_COMMUNITY): Payer: 59

## 2016-07-11 ENCOUNTER — Emergency Department (HOSPITAL_COMMUNITY)
Admission: EM | Admit: 2016-07-11 | Discharge: 2016-07-11 | Disposition: A | Discharge status: Home / Self Care | Payer: 59 | Attending: Emergency Medicine | Admitting: Emergency Medicine

## 2016-07-11 ENCOUNTER — Ambulatory Visit (INDEPENDENT_AMBULATORY_CARE_PROVIDER_SITE_OTHER): Payer: 59 | Admitting: Physical Medicine and Rehabilitation

## 2016-07-11 DIAGNOSIS — R0602 Shortness of breath: Secondary | ICD-10-CM | POA: Insufficient documentation

## 2016-07-11 DIAGNOSIS — Z79899 Other long term (current) drug therapy: Secondary | ICD-10-CM | POA: Insufficient documentation

## 2016-07-11 DIAGNOSIS — I10 Essential (primary) hypertension: Secondary | ICD-10-CM | POA: Insufficient documentation

## 2016-07-11 DIAGNOSIS — Z85118 Personal history of other malignant neoplasm of bronchus and lung: Secondary | ICD-10-CM | POA: Insufficient documentation

## 2016-07-11 DIAGNOSIS — R05 Cough: Secondary | ICD-10-CM | POA: Diagnosis not present

## 2016-07-11 DIAGNOSIS — Z87891 Personal history of nicotine dependence: Secondary | ICD-10-CM | POA: Diagnosis not present

## 2016-07-11 DIAGNOSIS — Z853 Personal history of malignant neoplasm of breast: Secondary | ICD-10-CM | POA: Diagnosis not present

## 2016-07-11 DIAGNOSIS — J449 Chronic obstructive pulmonary disease, unspecified: Secondary | ICD-10-CM | POA: Insufficient documentation

## 2016-07-11 DIAGNOSIS — Z791 Long term (current) use of non-steroidal anti-inflammatories (NSAID): Secondary | ICD-10-CM | POA: Insufficient documentation

## 2016-07-11 DIAGNOSIS — R0789 Other chest pain: Secondary | ICD-10-CM | POA: Insufficient documentation

## 2016-07-11 DIAGNOSIS — R062 Wheezing: Secondary | ICD-10-CM | POA: Diagnosis not present

## 2016-07-11 HISTORY — DX: Pain in right foot: M79.671

## 2016-07-11 HISTORY — DX: Cervicalgia: M54.2

## 2016-07-11 HISTORY — DX: Carpal tunnel syndrome, left upper limb: G56.02

## 2016-07-11 HISTORY — DX: Pain in left foot: M79.672

## 2016-07-11 HISTORY — DX: Pain in unspecified shoulder: M25.519

## 2016-07-11 HISTORY — DX: Other chronic pain: G89.29

## 2016-07-11 LAB — CBC
HCT: 40.3 % (ref 36.0–46.0)
Hemoglobin: 13.1 g/dL (ref 12.0–15.0)
MCH: 29.6 pg (ref 26.0–34.0)
MCHC: 32.5 g/dL (ref 30.0–36.0)
MCV: 91.2 fL (ref 78.0–100.0)
PLATELETS: 210 10*3/uL (ref 150–400)
RBC: 4.42 MIL/uL (ref 3.87–5.11)
RDW: 13.4 % (ref 11.5–15.5)
WBC: 5.9 10*3/uL (ref 4.0–10.5)

## 2016-07-11 LAB — BASIC METABOLIC PANEL
Anion gap: 7 (ref 5–15)
BUN: 12 mg/dL (ref 6–20)
CALCIUM: 9.4 mg/dL (ref 8.9–10.3)
CHLORIDE: 104 mmol/L (ref 101–111)
CO2: 28 mmol/L (ref 22–32)
CREATININE: 0.85 mg/dL (ref 0.44–1.00)
Glucose, Bld: 126 mg/dL — ABNORMAL HIGH (ref 65–99)
Potassium: 3.3 mmol/L — ABNORMAL LOW (ref 3.5–5.1)
SODIUM: 139 mmol/L (ref 135–145)

## 2016-07-11 LAB — TROPONIN I

## 2016-07-11 MED ORDER — POTASSIUM CHLORIDE CRYS ER 20 MEQ PO TBCR
40.0000 meq | EXTENDED_RELEASE_TABLET | Freq: Once | ORAL | Status: AC
  Administered 2016-07-11: 40 meq via ORAL
  Filled 2016-07-11: qty 2

## 2016-07-11 MED ORDER — IOPAMIDOL (ISOVUE-370) INJECTION 76%
100.0000 mL | Freq: Once | INTRAVENOUS | Status: AC | PRN
  Administered 2016-07-11: 100 mL via INTRAVENOUS

## 2016-07-11 MED ORDER — HYDROCODONE-ACETAMINOPHEN 5-325 MG PO TABS: ORAL_TABLET | ORAL | 0 refills | Status: DC

## 2016-07-11 MED ORDER — ALBUTEROL SULFATE HFA 108 (90 BASE) MCG/ACT IN AERS: 2.0000 | INHALATION_SPRAY | RESPIRATORY_TRACT | 0 refills | Status: DC | PRN

## 2016-07-11 MED ORDER — NAPROXEN 250 MG PO TABS: 250.0000 mg | ORAL_TABLET | Freq: Two times a day (BID) | ORAL | 0 refills | Status: DC | PRN

## 2016-07-11 MED ORDER — ALBUTEROL SULFATE (2.5 MG/3ML) 0.083% IN NEBU
2.5000 mg | INHALATION_SOLUTION | Freq: Once | RESPIRATORY_TRACT | Status: AC
  Administered 2016-07-11: 2.5 mg via RESPIRATORY_TRACT
  Filled 2016-07-11: qty 3

## 2016-07-11 MED ORDER — METHOCARBAMOL 500 MG PO TABS: 1000.0000 mg | ORAL_TABLET | Freq: Four times a day (QID) | ORAL | 0 refills | Status: DC | PRN

## 2016-07-11 MED ORDER — IPRATROPIUM-ALBUTEROL 0.5-2.5 (3) MG/3ML IN SOLN
3.0000 mL | Freq: Once | RESPIRATORY_TRACT | Status: AC
  Administered 2016-07-11: 3 mL via RESPIRATORY_TRACT
  Filled 2016-07-11: qty 3

## 2016-07-11 MED ORDER — HYDROCODONE-ACETAMINOPHEN 5-325 MG PO TABS
1.0000 | ORAL_TABLET | Freq: Once | ORAL | Status: AC
  Administered 2016-07-11: 1 via ORAL
  Filled 2016-07-11: qty 1

## 2016-07-11 NOTE — ED Provider Notes (Signed)
[facility] DEPT Provider Note   CSN: [id number] Arrival date & time: 07/11/16  9622     History   Chief Complaint Chief Complaint  Patient presents with  . Shortness of Breath  . Chest Pain    HPI Deborah Mullins is a 65 y.o. female.  HPI  Pt was seen at 0955. Per pt, c/o gradual onset and persistence of constant left upper chest wall "pain" and SOB for the past 2 to 3 days. Has been associated with SOB, cough and occasional "wheezing." Denies palpitations, no back pain, no fevers, no rash, no abd pain, no N/V/D.   Past Medical History:  Diagnosis Date  . Allergic rhinitis   . Anxiety   . Arthritis   . Bipolar 1 disorder (Deborah Mullins)   . Cancer [facility])    breast   . Carpal tunnel syndrome of left wrist   . Chronic neck pain   . Chronic shoulder pain    L>R  . Depression   . GERD (gastroesophageal reflux disease)   . Headache(784.0)   . History of lung surgery   . Hyperlipidemia   . Hypertension   . Lung cancer (Deborah Mullins)   . Lung nodule 2009  . Pain in both feet   . PONV (postoperative nausea and vomiting)   . Prediabetes    a1c 6.4 12/16  . Shortness of breath    exertion    Patient Active Problem List   Diagnosis Date Noted  . Prediabetes   . Anxiety   . Bipolar 1 disorder (Deborah Mullins)   . Depression   . Chronic back pain 11/07/2012  . Insomnia secondary to depression with anxiety 09/23/2012  . Unspecified vitamin D deficiency 09/23/2012  . COPD, mild (Deborah Mullins) 07/01/2012  . Acute bronchitis 06/08/2012  . Adenocarcinoma of lung, stage 1 (Deborah Mullins) 05/23/2012  . RECTAL BLEEDING 04/27/2010  . HEMORRHOIDS 03/11/2009  . ADENOCARCINOMA, BREAST 12/15/2008  . FIBROIDS, UTERUS 12/15/2008  . HYPERCHOLESTEROLEMIA 12/15/2008  . HYPERLIPIDEMIA 12/15/2008  . Anxiety and depression 12/15/2008  . GASTROESOPHAGEAL REFLUX DISEASE 12/15/2008  . GASTRITIS 12/15/2008  . CONSTIPATION 12/15/2008  . FLATULENCE ERUCTATION AND GAS PAIN 12/15/2008  . ABDOMINAL PAIN 12/15/2008  . RECTAL  BLEEDING, HX OF 12/15/2008    Past Surgical History:  Procedure Laterality Date  . BREAST SURGERY  2005   lumpectomy - left  . carpel tunnel Right   . CESAREAN SECTION  1971  . LUNG CANCER SURGERY Right 05/23/2012   reportedly clear  . TONSILLECTOMY    . TUBAL LIGATION      OB History    Gravida Para Term Preterm AB Living   '1 1 1     1   '$ SAB TAB Ectopic Multiple Live Births                   Home Medications    Prior to Admission medications   Medication Sig Start Date End Date Taking? Authorizing Provider  albuterol (PROVENTIL HFA;VENTOLIN HFA) 108 (90 BASE) MCG/ACT inhaler Inhale 2 puffs into the lungs every 6 (six) hours as needed for wheezing or shortness of breath. 06/16/14  Yes Susy Frizzle, MD  FLUoxetine (PROZAC) 40 MG capsule Take 1 capsule (40 mg total) by mouth daily. 08/27/15 08/26/16 Yes Cloria Spring, MD  gabapentin (NEURONTIN) 300 MG capsule Take 1 capsule (300 mg total) by mouth 3 (three) times daily. Patient taking differently: Take 600 mg by mouth at bedtime.  08/02/15  Yes Penni Bombard, MD  gabapentin (NEURONTIN) 800 MG tablet Take 1 tablet (800 mg total) by mouth at bedtime. Patient taking differently: Take 800 mg by mouth every morning.  10/27/15  Yes Penni Bombard, MD  hydrochlorothiazide (HYDRODIURIL) 25 MG tablet TAKE 1 TABLET BY MOUTH EVERY DAY 11/22/15  Yes Susy Frizzle, MD  ibuprofen (ADVIL,MOTRIN) 800 MG tablet Take 1 tablet (800 mg total) by mouth every 8 (eight) hours as needed. Patient taking differently: Take 800 mg by mouth every 8 (eight) hours as needed for mild pain.  06/26/16  Yes Mcarthur Rossetti, MD  pantoprazole (PROTONIX) 40 MG tablet TAKE 1 TABLET (40 MG TOTAL) BY MOUTH DAILY. 05/17/15  Yes Susy Frizzle, MD  rosuvastatin (CRESTOR) 40 MG tablet TAKE 1 TABLET BY MOUTH EVERY DAY 05/31/16  Yes Susy Frizzle, MD  EPINEPHrine 0.3 mg/0.3 mL IJ SOAJ injection Inject 0.3 mLs (0.3 mg total) into the muscle once.  11/30/14   Susy Frizzle, MD  polyethylene glycol powder (GLYCOLAX/MIRALAX) powder Take 17 g by mouth daily as needed (constipation). Patient not taking: Reported on 07/03/2016 02/05/13   Darrol Jump, MD    Family History Family History  Problem Relation Age of Onset  . Heart disease Mother     CHF  . Physical abuse Mother   . Depression Mother   . Cancer Sister   . ADD / ADHD Sister   . Alcohol abuse Father   . Alcohol abuse Brother   . Mental illness Brother   . Cancer Sister   . Heart disease Sister   . Physical abuse Sister   . OCD Sister   . Mental illness Sister   . Anxiety disorder Neg Hx   . Bipolar disorder Neg Hx   . Dementia Neg Hx   . Drug abuse Neg Hx   . Paranoid behavior Neg Hx   . Schizophrenia Neg Hx   . Seizures Neg Hx   . Sexual abuse Neg Hx     Social History Social History  Substance Use Topics  . Smoking status: Former Smoker    Packs/day: 1.00    Years: 10.00    Types: Cigarettes    Quit date: 08/14/1974  . Smokeless tobacco: Never Used  . Alcohol use No     Allergies   Bee venom and Penicillins   Review of Systems Review of Systems ROS: Statement: All systems negative except as marked or noted in the HPI; Constitutional: Negative for fever and chills. ; ; Eyes: Negative for eye pain, redness and discharge. ; ; ENMT: Negative for ear pain, hoarseness, nasal congestion, sinus pressure and sore throat. ; ; Cardiovascular: Negative for palpitations, diaphoresis, and peripheral edema. ; ; Respiratory: +cough, wheezing, SOB. Negative for stridor. ; ; Gastrointestinal: Negative for nausea, vomiting, diarrhea, abdominal pain, blood in stool, hematemesis, jaundice and rectal bleeding. . ; ; Genitourinary: Negative for dysuria, flank pain and hematuria. ; ; Musculoskeletal: +chest wall pain. Negative for back pain and neck pain. Negative for swelling and trauma.; ; Skin: Negative for pruritus, rash, abrasions, blisters, bruising and skin lesion.; ;  Neuro: Negative for headache, lightheadedness and neck stiffness. Negative for weakness, altered level of consciousness, altered mental status, extremity weakness, paresthesias, involuntary movement, seizure and syncope.       Physical Exam Updated Vital Signs BP 140/77   Pulse 66   Temp 97.4 F (36.3 C) (Oral)   Resp 20   Ht '5\' 3"'$  (1.6 m)   Wt 165 lb (74.8 kg)  SpO2 98%   BMI 29.23 kg/m   Physical Exam 1000; Physical examination:  Nursing notes reviewed; Vital signs and O2 SAT reviewed;  Constitutional: Well developed, Well nourished, Well hydrated, In no acute distress; Head:  Normocephalic, atraumatic; Eyes: EOMI, PERRL, No scleral icterus; ENMT: Mouth and pharynx normal, Mucous membranes moist; Neck: Supple, Full range of motion, No lymphadenopathy; Cardiovascular: Regular rate and rhythm, No gallop; Respiratory: Breath sounds coarse & equal bilaterally, No wheezes.  Speaking full sentences with ease, Normal respiratory effort/excursion; Chest: +left upper chest wall tender to palp. No soft tissue crepitus, no deformity, no rash. Movement normal; Abdomen: Soft, Nontender, Nondistended, Normal bowel sounds; Genitourinary: No CVA tenderness; Extremities: Pulses normal, No tenderness, No edema, No calf edema or asymmetry.; Neuro: AA&Ox3, Major CN grossly intact.  Speech clear. No gross focal motor or sensory deficits in extremities.; Skin: Color normal, Warm, Dry.   ED Treatments / Results  Labs (all labs ordered are listed, but only abnormal results are displayed)   EKG  EKG Interpretation  Date/Time:  Tuesday July 11 2016 09:10:28 EST Ventricular Rate:  64 PR Interval:    QRS Duration: 90 QT Interval:  420 QTC Calculation: 434 R Axis:   39 Text Interpretation:  Sinus rhythm Low voltage, precordial leads Nonspecific ST and T wave abnormality Baseline wander When compared with ECG of 05/21/2012 No significant change was found Confirmed by [facility]  MD, Nunzio Cory [phone]) on  07/11/2016 10:15:48 AM       Radiology   Procedures Procedures (including critical care time)  Medications Ordered in ED Medications  ipratropium-albuterol (DUONEB) 0.5-2.5 (3) MG/3ML nebulizer solution 3 mL (3 mLs Nebulization Given 07/11/16 1043)  albuterol (PROVENTIL) (2.5 MG/3ML) 0.083% nebulizer solution 2.5 mg (2.5 mg Nebulization Given 07/11/16 1043)  HYDROcodone-acetaminophen (NORCO/VICODIN) 5-325 MG per tablet 1 tablet (1 tablet Oral Given 07/11/16 1032)  potassium chloride SA (K-DUR,KLOR-CON) CR tablet 40 mEq (40 mEq Oral Given 07/11/16 1140)  iopamidol (ISOVUE-370) 76 % injection 100 mL (100 mLs Intravenous Contrast Given 07/11/16 1239)     Initial Impression / Assessment and Plan / ED Course  I have reviewed the triage vital signs and the nursing notes.  Pertinent labs & imaging results that were available during my care of the patient were reviewed by me and considered in my medical decision making (see chart for details).  MDM Reviewed: vitals, previous chart and nursing note Reviewed previous: labs and ECG Interpretation: labs, ECG, x-ray and CT scan   Results for orders placed or performed during the hospital encounter of 60/45/40  Basic metabolic panel  Result Value Ref Range   Sodium 139 135 - 145 mmol/L   Potassium 3.3 (L) 3.5 - 5.1 mmol/L   Chloride 104 101 - 111 mmol/L   CO2 28 22 - 32 mmol/L   Glucose, Bld 126 (H) 65 - 99 mg/dL   BUN 12 6 - 20 mg/dL   Creatinine, Ser 0.85 0.44 - 1.00 mg/dL   Calcium 9.4 8.9 - 10.3 mg/dL   GFR calc non Af Amer >60 >60 mL/min   GFR calc Af Amer >60 >60 mL/min   Anion gap 7 5 - 15  CBC  Result Value Ref Range   WBC 5.9 4.0 - 10.5 K/uL   RBC 4.42 3.87 - 5.11 MIL/uL   Hemoglobin 13.1 12.0 - 15.0 g/dL   HCT 40.3 36.0 - 46.0 %   MCV 91.2 78.0 - 100.0 fL   MCH 29.6 26.0 - 34.0 pg   MCHC 32.5  30.0 - 36.0 g/dL   RDW 13.4 11.5 - 15.5 %   Platelets 210 150 - 400 K/uL  Troponin I  Result Value Ref Range   Troponin I  <0.03 <0.03 ng/mL  Troponin I  Result Value Ref Range   Troponin I <0.03 <0.03 ng/mL   Dg Chest 2 View Result Date: 07/11/2016 CLINICAL DATA:  Left-sided chest pain EXAM: CHEST  2 VIEW COMPARISON:  08/14/2014 FINDINGS: EKG leads create artifact over the chest. Normal heart size and mediastinal contours. No acute infiltrate or edema. No effusion or pneumothorax. No acute osseous findings. IMPRESSION: No active cardiopulmonary disease. Electronically Signed   By: Monte Fantasia M.D.   On: 07/11/2016 09:52   Ct Angio Chest Pe W/cm &/or Wo Cm Result Date: 07/11/2016 CLINICAL DATA:  Chest pain and shortness of breath for 2 days. History of lung carcinoma and breast carcinoma EXAM: CT ANGIOGRAPHY CHEST WITH CONTRAST TECHNIQUE: Multidetector CT imaging of the chest was performed using the standard protocol during bolus administration of intravenous contrast. Multiplanar CT image reconstructions and MIPs were obtained to evaluate the vascular anatomy. CONTRAST:  100 mL Isovue 370 nonionic COMPARISON:  Chest CT September 17, 2012 and chest radiograph July 11, 2016 FINDINGS: Cardiovascular: There is no demonstrable pulmonary embolus. There is no appreciable thoracic aortic aneurysm or dissection. The visualized great vessels appear unremarkable. There are foci of coronary artery calcification. Pericardium is not appreciably thickened. Mediastinum/Nodes: Thyroid appears unremarkable. There is no appreciable thoracic adenopathy. Lungs/Pleura: Mild fibrosis, likely due to radiation therapy change, noted in the anterior segment of the left upper lobe peripherally, stable. There is postoperative change on the right in the region of the superior segment right lower lobe, stable. There is mild atelectatic change in each lung base. There is no edema or consolidation. Axial slice 39 series 6, there is a 3 mm nodular opacity in the anterior segment right upper lobe, also present on previous study and not convincingly  changed. On axial slice 48 series 6, there is a 3 mm nodular opacity in the anterior segment right upper lobe more inferiorly, stable. On axial slice 53 series 6, there is a 2 mm nodular opacity abutting the pleura in the medial segment right middle lobe. There is no pleural effusion or pleural thickening. Upper Abdomen: There is a stable 6 mm probable cyst in the medial segment left lobe of the liver. Visualized upper abdominal structures otherwise appear unremarkable. Musculoskeletal: There is degenerative change in the thoracic spine. There are no blastic or lytic bone lesions. Review of the MIP images confirms the above findings. IMPRESSION: No demonstrable pulmonary embolus. There are scattered foci of coronary artery calcification. Areas of fibrosis on the left, likely due to radiation therapy change. Small nodular lesions in the right remain stable. No edema or consolidation. No adenopathy. Electronically Signed   By: Lowella Grip III M.D.   On: 07/11/2016 13:17    1415:  Doubt PE as cause for symptoms with negative CT-A chest.  Doubt ACS as cause for symptoms with normal troponin and unchanged EKG from previous after 2 days of constant symptoms. Feels better after short neb and pain meds and wants to go home now. Tx symptomatically, f/u PMD. Dx and testing d/w pt and family.  Questions answered.  Verb understanding, agreeable to d/c home with outpt f/u.     Final Clinical Impressions(s) / ED Diagnoses   Final diagnoses:  None    New Prescriptions New Prescriptions   No medications on  file     Francine Graven, DO 07/15/16 [phone]

## 2016-07-11 NOTE — ED Triage Notes (Signed)
Patient complains of pain in left side of chest with shortness of breath that started Sunday. Patient states history of right sided lung cancer and left sided breast cancer.

## 2016-07-11 NOTE — Discharge Instructions (Signed)
Take the prescriptions as directed.  Apply moist heat or ice to the area(s) of discomfort, for 15 minutes at a time, several times per day for the next few days.  Do not fall asleep on a heating or ice pack.  Call your regular medical doctor today to schedule a follow up appointment this week.  Return to the Emergency Department immediately if worsening.

## 2016-07-11 NOTE — ED Notes (Signed)
Upon entering room pt is crying stating "I'm afraid I have a blood clot". Pt taught to slow breathing.

## 2016-07-13 NOTE — Telephone Encounter (Signed)
LMOM to call. I have a question about her meds.

## 2016-07-14 NOTE — Telephone Encounter (Signed)
TRIAGE pt for TCS W/ MAC, MOVI PREP SPLIT DOSING, REGULAR BREAKFAST. CLEAR LIQUIDS AFTER 9 AM.

## 2016-07-14 NOTE — Telephone Encounter (Signed)
Gastroenterology Pre-Procedure Review  Request Date: 07/13/2016 Requesting Physician:   PATIENT REVIEW QUESTIONS: The patient responded to the following health history questions as indicated:    1. Diabetes Melitis: no 2. Joint replacements in the past 12 months: no 3. Major health problems in the past 3 months: no 4. Has an artificial valve or MVP: no 5. Has a defibrillator: no 6. Has been advised in past to take antibiotics in advance of a procedure like teeth cleaning: no 7. Family history of colon cancer: no  8. Alcohol Use: no 9. History of sleep apnea: no  10. History of coronary artery or other vascular stents placed within the last 12 months: no    MEDICATIONS & ALLERGIES:    Patient reports the following regarding taking any blood thinners:   Plavix? no Aspirin? no Coumadin? no Brilinta? no Xarelto? no Eliquis? no Pradaxa? no Savaysa? no Effient? no  Patient confirms/reports the following medications:  Current Outpatient Prescriptions  Medication Sig Dispense Refill  . FLUoxetine (PROZAC) 40 MG capsule Take 1 capsule (40 mg total) by mouth daily. 90 capsule 2  . gabapentin (NEURONTIN) 300 MG capsule Take 1 capsule (300 mg total) by mouth 3 (three) times daily. (Patient taking differently: Take 600 mg by mouth at bedtime. ) 90 capsule 11  . gabapentin (NEURONTIN) 800 MG tablet Take 1 tablet (800 mg total) by mouth at bedtime. (Patient taking differently: Take 800 mg by mouth every morning. ) 90 tablet 4  . hydrochlorothiazide (HYDRODIURIL) 25 MG tablet TAKE 1 TABLET BY MOUTH EVERY DAY 90 tablet 4  . ibuprofen (ADVIL,MOTRIN) 800 MG tablet Take 1 tablet (800 mg total) by mouth every 8 (eight) hours as needed. (Patient taking differently: Take 800 mg by mouth every 8 (eight) hours as needed for mild pain. ) 90 tablet 3  . pantoprazole (PROTONIX) 40 MG tablet TAKE 1 TABLET (40 MG TOTAL) BY MOUTH DAILY. 90 tablet 3  . rosuvastatin (CRESTOR) 40 MG tablet TAKE 1 TABLET BY  MOUTH EVERY DAY 90 tablet 0  . albuterol (PROVENTIL HFA;VENTOLIN HFA) 108 (90 Base) MCG/ACT inhaler Inhale 2 puffs into the lungs every 4 (four) hours as needed for wheezing or shortness of breath. 1 Inhaler 0  . EPINEPHrine 0.3 mg/0.3 mL IJ SOAJ injection Inject 0.3 mLs (0.3 mg total) into the muscle once. 1 Device 3  . HYDROcodone-acetaminophen (NORCO/VICODIN) 5-325 MG tablet 1 or 2 tabs PO q8 hours prn pain (Patient not taking: Reported on 07/13/2016) 8 tablet 0  . methocarbamol (ROBAXIN) 500 MG tablet Take 2 tablets (1,000 mg total) by mouth 4 (four) times daily as needed for muscle spasms (muscle spasm/pain). (Patient not taking: Reported on 07/13/2016) 25 tablet 0  . naproxen (NAPROSYN) 250 MG tablet Take 1 tablet (250 mg total) by mouth 2 (two) times daily as needed for mild pain or moderate pain (take with food). (Patient not taking: Reported on 07/13/2016) 14 tablet 0  . polyethylene glycol powder (GLYCOLAX/MIRALAX) powder Take 17 g by mouth daily as needed (constipation). (Patient not taking: Reported on 07/03/2016) 850 g 0   No current facility-administered medications for this visit.     Patient confirms/reports the following allergies:  Allergies  Allergen Reactions  . Bee Venom Anaphylaxis  . Penicillins Rash    Has patient had a PCN reaction causing immediate rash, facial/tongue/throat swelling, SOB or lightheadedness with hypotension: Yes Has patient had a PCN reaction causing severe rash involving mucus membranes or skin necrosis: No Has patient had a PCN  reaction that required hospitalization No Has patient had a PCN reaction occurring within the last 10 years: No If all of the above answers are "NO", then may proceed with Cephalosporin use.      No orders of the defined types were placed in this encounter.   AUTHORIZATION INFORMATION Primary Insurance:  ID #:  Group #:  Pre-Cert / Auth required:  Pre-Cert / Auth #:   Secondary Insurance: ID #: Group #:  Pre-Cert /  Auth required:  Pre-Cert / Auth #:   SCHEDULE INFORMATION: Procedure has been scheduled as follows:  Date:  08/04/2016             Time: 9:30 AM  Location: [name] Short Stay  This Gastroenterology Pre-Precedure Review Form is being routed to the following provider(s): Barney Drain, MD

## 2016-07-15 ENCOUNTER — Other Ambulatory Visit: Payer: Self-pay | Admitting: Diagnostic Neuroimaging

## 2016-07-18 ENCOUNTER — Other Ambulatory Visit: Payer: Self-pay

## 2016-07-18 DIAGNOSIS — Z1211 Encounter for screening for malignant neoplasm of colon: Secondary | ICD-10-CM

## 2016-07-18 NOTE — Telephone Encounter (Signed)
Orders entered, waiting for the pre-op appt.

## 2016-07-18 NOTE — Telephone Encounter (Signed)
LMOM for a return call as soon as she gets the message. She will need to be scheduled in OR and need pre-op appt.

## 2016-07-18 NOTE — Telephone Encounter (Signed)
Per Ginger, we had a cancellation on 07/25/2016 at 8:15 AM. I called pt and she is willing to take that appt since she needs to be done in endo. She will be following her husband's procedure and she will have her friend bring them and take them home.

## 2016-07-19 NOTE — Telephone Encounter (Signed)
LMOM she can have a pre-op appt 07/20/2016 at 2:15 pm. Please let us know.

## 2016-07-20 ENCOUNTER — Encounter (HOSPITAL_COMMUNITY): Payer: Self-pay

## 2016-07-20 ENCOUNTER — Encounter (HOSPITAL_COMMUNITY)
Admission: RE | Admit: 2016-07-20 | Discharge: 2016-07-20 | Disposition: A | Discharge status: Home / Self Care | Payer: 59 | Source: Ambulatory Visit | Attending: Gastroenterology | Admitting: Gastroenterology

## 2016-07-20 MED ORDER — PEG 3350-KCL-NA BICARB-NACL 420 G PO SOLR: 4000.0000 mL | ORAL | 0 refills | Status: DC

## 2016-07-20 NOTE — Telephone Encounter (Signed)
Pt came by the office for her instructions and I have sent the prescription to [facility].

## 2016-07-22 ENCOUNTER — Other Ambulatory Visit: Payer: Self-pay | Admitting: Family Medicine

## 2016-07-24 ENCOUNTER — Encounter (INDEPENDENT_AMBULATORY_CARE_PROVIDER_SITE_OTHER): Payer: Self-pay | Admitting: Physical Medicine and Rehabilitation

## 2016-07-24 ENCOUNTER — Telehealth: Payer: Self-pay

## 2016-07-24 ENCOUNTER — Ambulatory Visit (INDEPENDENT_AMBULATORY_CARE_PROVIDER_SITE_OTHER): Payer: 59 | Admitting: Physical Medicine and Rehabilitation

## 2016-07-24 ENCOUNTER — Ambulatory Visit (INDEPENDENT_AMBULATORY_CARE_PROVIDER_SITE_OTHER): Payer: 59 | Admitting: Orthopaedic Surgery

## 2016-07-24 VITALS — BP 141/85 | HR 62

## 2016-07-24 DIAGNOSIS — M25511 Pain in right shoulder: Secondary | ICD-10-CM | POA: Diagnosis not present

## 2016-07-24 DIAGNOSIS — G8929 Other chronic pain: Secondary | ICD-10-CM

## 2016-07-24 DIAGNOSIS — M25512 Pain in left shoulder: Secondary | ICD-10-CM | POA: Diagnosis not present

## 2016-07-24 MED ORDER — TRIAMCINOLONE ACETONIDE 40 MG/ML IJ SUSP
80.0000 mg | INTRAMUSCULAR | Status: AC | PRN
  Administered 2016-07-24: 80 mg via INTRA_ARTICULAR

## 2016-07-24 MED ORDER — LIDOCAINE HCL 2 % IJ SOLN
4.0000 mL | INTRAMUSCULAR | Status: AC | PRN
  Administered 2016-07-24: 4 mL

## 2016-07-24 NOTE — Telephone Encounter (Signed)
I called [employer] @ [phone] and spoke to [place] D who said a PA is not required for screening colonoscopy.

## 2016-07-24 NOTE — Progress Notes (Signed)
MERCADES Mullins - 65 y.o. female MRN [id number]  Date of birth: 1951-05-25  Office Visit Note: Visit Date: 07/24/2016 PCP: Odette Fraction, MD Referred by: Susy Frizzle, MD  Subjective: Chief Complaint  Patient presents with  . Right Shoulder - Pain  . Left Shoulder - Pain   HPI: Deborah Mullins is a 65 year old female with bilateral severe osteoarthritis of the glenohumeral joints. She reports left more than right but bilateral pain and reduced range of motion. She has failed conservative care including medication management.   Bilateral shoulder pain. Thinks left side might be a little worse. Knife like stabbing pain.   ROS Otherwise per HPI.  Assessment & Plan: Visit Diagnoses:  1. Chronic pain of both shoulders     Plan: Findings:  Bilateral intra-articular glenohumeral joint injections with fluoroscopic guidance for pain in both shoulders due primarily to severe osteoarthritic changes.    Meds & Orders: No orders of the defined types were placed in this encounter.   Orders Placed This Encounter  Procedures  . Large Joint Injection/Arthrocentesis  . Large Joint Injection/Arthrocentesis    Follow-up: Return for re-schedule Dr. Ninfa Linden for 2 weeks.   Procedures: Glenohumeral joint injection with fluoroscopic guidance Date/Time: 07/24/2016 10:18 AM Performed by: Magnus Sinning Authorized by: Magnus Sinning   Consent Given by:  Patient Site marked: the procedure site was marked   Timeout: prior to procedure the correct patient, procedure, and site was verified   Indications:  Pain Location:  Shoulder Site:  R glenohumeral Prep: patient was prepped and draped in usual sterile fashion   Needle Size:  22 G Needle Length:  3.5 inches Approach:  Anteromedial Ultrasound Guidance: No   Fluoroscopic Guidance: Yes   Arthrogram: No   Medications:  4 mL lidocaine 2 %; 80 mg triamcinolone acetonide 40 MG/ML Aspiration Attempted: Yes   Patient tolerance:  Patient  tolerated the procedure well with no immediate complications  There was excellent flow of contrast producing a partial arthrogram of the glenohumeral joint. The patient did have some relief of her symptoms with increased range of motion during the anesthetic phase of the injection. Glenohumeral joint injection with fluoroscopic guidance Date/Time: 07/24/2016 10:19 AM Performed by: Magnus Sinning Authorized by: Magnus Sinning   Consent Given by:  Patient Site marked: the procedure site was marked   Timeout: prior to procedure the correct patient, procedure, and site was verified   Indications:  Pain Location:  Shoulder Site:  L glenohumeral Prep: patient was prepped and draped in usual sterile fashion   Needle Size:  22 G Needle Length:  3.5 inches Approach:  Anteromedial Ultrasound Guidance: No   Fluoroscopic Guidance: Yes   Arthrogram: No   Medications:  4 mL lidocaine 2 %; 80 mg triamcinolone acetonide 40 MG/ML Aspiration Attempted: Yes   Patient tolerance:  Patient tolerated the procedure well with no immediate complications  There was excellent flow of contrast producing a partial arthrogram of the glenohumeral joint. The patient did not have much relief of her symptoms during the anesthetic phase of the injection.    No notes on file   Clinical History: No specialty comments available.  She reports that she quit smoking about 41 years ago. Her smoking use included Cigarettes. She has a 10.00 pack-year smoking history. She has never used smokeless tobacco.   Recent Labs  11/02/15 0845  HGBA1C 6.0*    Objective:  VS:  HT:    WT:   BMI:     BP:(!) 141/85  HR:62bpm  TEMP: ( )  RESP:95 % Physical Exam  Musculoskeletal:  Very limited range of motion with the bilateral shoulders. She cannot abduct very high at all she cannot really reach around behind her back. She has good intact strength.    Ortho Exam Imaging: No results found.  Past  Medical/Family/Surgical/Social History: Medications & Allergies reviewed per EMR Patient Active Problem List   Diagnosis Date Noted  . Chronic pain of both shoulders 07/24/2016  . Prediabetes   . Anxiety   . Bipolar 1 disorder ([place])   . Depression   . Chronic back pain 11/07/2012  . Insomnia secondary to depression with anxiety 09/23/2012  . Unspecified vitamin D deficiency 09/23/2012  . COPD, mild ([place]) 07/01/2012  . Acute bronchitis 06/08/2012  . Adenocarcinoma of lung, stage 1 ([place]) 05/23/2012  . RECTAL BLEEDING 04/27/2010  . HEMORRHOIDS 03/11/2009  . ADENOCARCINOMA, BREAST 12/15/2008  . FIBROIDS, UTERUS 12/15/2008  . HYPERCHOLESTEROLEMIA 12/15/2008  . HYPERLIPIDEMIA 12/15/2008  . Anxiety and depression 12/15/2008  . GASTROESOPHAGEAL REFLUX DISEASE 12/15/2008  . GASTRITIS 12/15/2008  . CONSTIPATION 12/15/2008  . FLATULENCE ERUCTATION AND GAS PAIN 12/15/2008  . ABDOMINAL PAIN 12/15/2008  . RECTAL BLEEDING, HX OF 12/15/2008   Past Medical History:  Diagnosis Date  . Allergic rhinitis   . Anxiety   . Arthritis   . Bipolar 1 disorder ([place])   . Cancer [facility])    breast   . Carpal tunnel syndrome of left wrist   . Chronic neck pain   . Chronic shoulder pain    L>R  . Depression   . GERD (gastroesophageal reflux disease)   . Headache(784.0)   . History of lung surgery   . Hyperlipidemia   . Hypertension   . Lung cancer ([place])   . Lung nodule 2009  . Pain in both feet   . PONV (postoperative nausea and vomiting)   . Prediabetes    a1c 6.4 12/16  . Shortness of breath    exertion   Family History  Problem Relation Age of Onset  . Heart disease Mother     CHF  . Physical abuse Mother   . Depression Mother   . Cancer Sister   . ADD / ADHD Sister   . Alcohol abuse Father   . Alcohol abuse Brother   . Mental illness Brother   . Cancer Sister   . Heart disease Sister   . Physical abuse Sister   . OCD Sister   . Mental illness Sister   . Anxiety disorder Neg  Hx   . Bipolar disorder Neg Hx   . Dementia Neg Hx   . Drug abuse Neg Hx   . Paranoid behavior Neg Hx   . Schizophrenia Neg Hx   . Seizures Neg Hx   . Sexual abuse Neg Hx    Past Surgical History:  Procedure Laterality Date  . BREAST SURGERY  2005   lumpectomy - left  . carpel tunnel Right   . CESAREAN SECTION  1971  . LUNG CANCER SURGERY Right 05/23/2012   reportedly clear  . TONSILLECTOMY    . TUBAL LIGATION     Social History   Occupational History  . retired     Location manager   Social History Main Topics  . Smoking status: Former Smoker    Packs/day: 1.00    Years: 10.00    Types: Cigarettes    Quit date: 08/14/1974  . Smokeless tobacco: Never Used  . Alcohol use  No  . Drug use: No  . Sexual activity: No

## 2016-07-24 NOTE — Patient Instructions (Signed)

## 2016-07-25 ENCOUNTER — Ambulatory Visit (HOSPITAL_COMMUNITY): Payer: 59 | Admitting: Anesthesiology

## 2016-07-25 ENCOUNTER — Encounter (HOSPITAL_COMMUNITY): Payer: Self-pay | Admitting: *Deleted

## 2016-07-25 ENCOUNTER — Ambulatory Visit (HOSPITAL_COMMUNITY)
Admission: RE | Admit: 2016-07-25 | Discharge: 2016-07-25 | Disposition: A | Discharge status: Home / Self Care | Payer: 59 | Source: Ambulatory Visit | Attending: Gastroenterology | Admitting: Gastroenterology

## 2016-07-25 ENCOUNTER — Encounter (HOSPITAL_COMMUNITY)
Admission: RE | Disposition: A | Discharge status: Home / Self Care | Payer: Self-pay | Source: Ambulatory Visit | Attending: Gastroenterology

## 2016-07-25 DIAGNOSIS — Q438 Other specified congenital malformations of intestine: Secondary | ICD-10-CM | POA: Diagnosis not present

## 2016-07-25 DIAGNOSIS — Z79899 Other long term (current) drug therapy: Secondary | ICD-10-CM | POA: Diagnosis not present

## 2016-07-25 DIAGNOSIS — K644 Residual hemorrhoidal skin tags: Secondary | ICD-10-CM | POA: Insufficient documentation

## 2016-07-25 DIAGNOSIS — F419 Anxiety disorder, unspecified: Secondary | ICD-10-CM | POA: Insufficient documentation

## 2016-07-25 DIAGNOSIS — Z1212 Encounter for screening for malignant neoplasm of rectum: Secondary | ICD-10-CM

## 2016-07-25 DIAGNOSIS — Z853 Personal history of malignant neoplasm of breast: Secondary | ICD-10-CM | POA: Diagnosis not present

## 2016-07-25 DIAGNOSIS — E785 Hyperlipidemia, unspecified: Secondary | ICD-10-CM | POA: Diagnosis not present

## 2016-07-25 DIAGNOSIS — R51 Headache: Secondary | ICD-10-CM | POA: Insufficient documentation

## 2016-07-25 DIAGNOSIS — M1990 Unspecified osteoarthritis, unspecified site: Secondary | ICD-10-CM | POA: Insufficient documentation

## 2016-07-25 DIAGNOSIS — J449 Chronic obstructive pulmonary disease, unspecified: Secondary | ICD-10-CM | POA: Insufficient documentation

## 2016-07-25 DIAGNOSIS — R7303 Prediabetes: Secondary | ICD-10-CM | POA: Insufficient documentation

## 2016-07-25 DIAGNOSIS — K219 Gastro-esophageal reflux disease without esophagitis: Secondary | ICD-10-CM | POA: Insufficient documentation

## 2016-07-25 DIAGNOSIS — Z87891 Personal history of nicotine dependence: Secondary | ICD-10-CM | POA: Diagnosis not present

## 2016-07-25 DIAGNOSIS — K648 Other hemorrhoids: Secondary | ICD-10-CM | POA: Diagnosis not present

## 2016-07-25 DIAGNOSIS — Z1211 Encounter for screening for malignant neoplasm of colon: Secondary | ICD-10-CM | POA: Diagnosis not present

## 2016-07-25 DIAGNOSIS — Z85118 Personal history of other malignant neoplasm of bronchus and lung: Secondary | ICD-10-CM | POA: Diagnosis not present

## 2016-07-25 DIAGNOSIS — F319 Bipolar disorder, unspecified: Secondary | ICD-10-CM | POA: Insufficient documentation

## 2016-07-25 DIAGNOSIS — I10 Essential (primary) hypertension: Secondary | ICD-10-CM | POA: Diagnosis not present

## 2016-07-25 HISTORY — PX: COLONOSCOPY WITH PROPOFOL: SHX5780

## 2016-07-25 LAB — GLUCOSE, CAPILLARY
GLUCOSE-CAPILLARY: 130 mg/dL — AB (ref 65–99)
Glucose-Capillary: 140 mg/dL — ABNORMAL HIGH (ref 65–99)

## 2016-07-25 SURGERY — COLONOSCOPY WITH PROPOFOL
Anesthesia: Monitor Anesthesia Care

## 2016-07-25 MED ORDER — MIDAZOLAM HCL 2 MG/2ML IJ SOLN
INTRAMUSCULAR | Status: AC
  Filled 2016-07-25: qty 2

## 2016-07-25 MED ORDER — MIDAZOLAM HCL 2 MG/2ML IJ SOLN
1.0000 mg | INTRAMUSCULAR | Status: DC | PRN
  Administered 2016-07-25: 2 mg via INTRAVENOUS

## 2016-07-25 MED ORDER — PROPOFOL 500 MG/50ML IV EMUL
INTRAVENOUS | Status: DC | PRN
  Administered 2016-07-25: 09:00:00 via INTRAVENOUS
  Administered 2016-07-25: 150 ug/kg/min via INTRAVENOUS

## 2016-07-25 MED ORDER — GLYCOPYRROLATE 0.2 MG/ML IJ SOLN
0.2000 mg | Freq: Once | INTRAMUSCULAR | Status: AC | PRN
  Administered 2016-07-25: 0.2 mg via INTRAVENOUS

## 2016-07-25 MED ORDER — MIDAZOLAM HCL 5 MG/5ML IJ SOLN
INTRAMUSCULAR | Status: DC | PRN
  Administered 2016-07-25: 2 mg via INTRAVENOUS

## 2016-07-25 MED ORDER — LACTATED RINGERS IV SOLN
INTRAVENOUS | Status: DC
  Administered 2016-07-25 (×2): via INTRAVENOUS

## 2016-07-25 MED ORDER — GLYCOPYRROLATE 0.2 MG/ML IJ SOLN
INTRAMUSCULAR | Status: AC
  Filled 2016-07-25: qty 1

## 2016-07-25 MED ORDER — PROPOFOL 10 MG/ML IV BOLUS
INTRAVENOUS | Status: AC
  Filled 2016-07-25: qty 20

## 2016-07-25 MED ORDER — FENTANYL CITRATE (PF) 100 MCG/2ML IJ SOLN
25.0000 ug | INTRAMUSCULAR | Status: AC | PRN
  Administered 2016-07-25 (×2): 25 ug via INTRAVENOUS

## 2016-07-25 MED ORDER — STERILE WATER FOR IRRIGATION IR SOLN
Status: DC | PRN
  Administered 2016-07-25: 100 mL

## 2016-07-25 MED ORDER — FENTANYL CITRATE (PF) 100 MCG/2ML IJ SOLN
INTRAMUSCULAR | Status: AC
  Filled 2016-07-25: qty 2

## 2016-07-25 NOTE — Anesthesia Procedure Notes (Signed)
Procedure Name: MAC Date/Time: 07/25/2016 8:24 AM Performed by: Andree Elk, Nerine Pulse A Pre-anesthesia Checklist: Patient identified, Emergency Drugs available, Suction available, Patient being monitored and Timeout performed Oxygen Delivery Method: Simple face mask

## 2016-07-25 NOTE — Discharge Instructions (Signed)
You have internal hemorrhoids. YOU DID NOT HAVE ANY POLYPS.  Next colonoscopy in 10 years.  FOLLOW A HIGH FIBER DIET. AVOID ITEMS THAT CAUSE BLOATING. SEE INFO BELOW.  USE PREPARATION H FOUR TIMES  A DAY IF NEEDED TO RELIEVE RECTAL PAIN/PRESSURE/BLEEDING.  Colonoscopy Care After Read the instructions outlined below and refer to this sheet in the next week. These discharge instructions provide you with general information on caring for yourself after you leave the hospital. While your treatment has been planned according to the most current medical practices available, unavoidable complications occasionally occur. If you have any problems or questions after discharge, call DR. Soul Deveney, [phone].  ACTIVITY  You may resume your regular activity, but move at a slower pace for the next 24 hours.   Take frequent rest periods for the next 24 hours.   Walking will help get rid of the air and reduce the bloated feeling in your belly (abdomen).   No driving for 24 hours (because of the medicine (anesthesia) used during the test).   You may shower.   Do not sign any important legal documents or operate any machinery for 24 hours (because of the anesthesia used during the test).    NUTRITION  Drink plenty of fluids.   You may resume your normal diet as instructed by your doctor.   Begin with a light meal and progress to your normal diet. Heavy or fried foods are harder to digest and may make you feel sick to your stomach (nauseated).   Avoid alcoholic beverages for 24 hours or as instructed.    MEDICATIONS  You may resume your normal medications.   WHAT YOU CAN EXPECT TODAY  Some feelings of bloating in the abdomen.   Passage of more gas than usual.   Spotting of blood in your stool or on the toilet paper  .  IF YOU HAD POLYPS REMOVED DURING THE COLONOSCOPY:  Eat a soft diet IF YOU HAVE NAUSEA, BLOATING, ABDOMINAL PAIN, OR VOMITING.    FINDING OUT THE RESULTS OF YOUR  TEST Not all test results are available during your visit. DR. Oneida Alar WILL CALL YOU WITHIN 7 DAYS OF YOUR PROCEDUE WITH YOUR RESULTS. Do not assume everything is normal if you have not heard from DR. Danicia Terhaar IN ONE WEEK, CALL HER OFFICE AT [phone].  SEEK IMMEDIATE MEDICAL ATTENTION AND CALL THE OFFICE: [phone] IF:  You have more than a spotting of blood in your stool.   Your belly is swollen (abdominal distention).   You are nauseated or vomiting.   You have a temperature over 101F.   You have abdominal pain or discomfort that is severe or gets worse throughout the day.  High-Fiber Diet A high-fiber diet changes your normal diet to include more whole grains, legumes, fruits, and vegetables. Changes in the diet involve replacing refined carbohydrates with unrefined foods. The calorie level of the diet is essentially unchanged. The Dietary Reference Intake (recommended amount) for adult males is 38 grams per day. For adult females, it is 25 grams per day. Pregnant and lactating women should consume 28 grams of fiber per day. Fiber is the intact part of a plant that is not broken down during digestion. Functional fiber is fiber that has been isolated from the plant to provide a beneficial effect in the body. PURPOSE  Increase stool bulk.   Ease and regulate bowel movements.   Lower cholesterol.  INDICATIONS THAT YOU NEED MORE FIBER  Constipation and hemorrhoids.   Uncomplicated diverticulosis (  intestine condition) and irritable bowel syndrome.   Weight management.   As a protective measure against hardening of the arteries (atherosclerosis), diabetes, and cancer.   GUIDELINES FOR INCREASING FIBER IN THE DIET  Start adding fiber to the diet slowly. A gradual increase of about 5 more grams (2 slices of whole-wheat bread, 2 servings of most fruits or vegetables, or 1 bowl of high-fiber cereal) per day is best. Too rapid an increase in fiber may result in constipation,  flatulence, and bloating.   Drink enough water and fluids to keep your urine clear or pale yellow. Water, juice, or caffeine-free drinks are recommended. Not drinking enough fluid may cause constipation.   Eat a variety of high-fiber foods rather than one type of fiber.   Try to increase your intake of fiber through using high-fiber foods rather than fiber pills or supplements that contain small amounts of fiber.   The goal is to change the types of food eaten. Do not supplement your present diet with high-fiber foods, but replace foods in your present diet.  INCLUDE A VARIETY OF FIBER SOURCES  Replace refined and processed grains with whole grains, canned fruits with fresh fruits, and incorporate other fiber sources. White rice, white breads, and most bakery goods contain little or no fiber.   Brown whole-grain rice, buckwheat oats, and many fruits and vegetables are all good sources of fiber. These include: broccoli, Brussels sprouts, cabbage, cauliflower, beets, sweet potatoes, white potatoes (skin on), carrots, tomatoes, eggplant, squash, berries, fresh fruits, and dried fruits.   Cereals appear to be the richest source of fiber. Cereal fiber is found in whole grains and bran. Bran is the fiber-rich outer coat of cereal grain, which is largely removed in refining. In whole-grain cereals, the bran remains. In breakfast cereals, the largest amount of fiber is found in those with "bran" in their names. The fiber content is sometimes indicated on the label.   You may need to include additional fruits and vegetables each day.   In baking, for 1 cup white flour, you may use the following substitutions:   1 cup whole-wheat flour minus 2 tablespoons.   1/2 cup white flour plus 1/2 cup whole-wheat flour.   Hemorrhoids Hemorrhoids are dilated (enlarged) veins around the rectum. Sometimes clots will form in the veins. This makes them swollen and painful. These are called thrombosed  hemorrhoids. Causes of hemorrhoids include:  Constipation.   Straining to have a bowel movement.   HEAVY LIFTING HOME CARE INSTRUCTIONS  Eat a well balanced diet and drink 6 to 8 glasses of water every day to avoid constipation. You may also use a bulk laxative.   Avoid straining to have bowel movements.   Keep anal area dry and clean.   Do not use a donut shaped pillow or sit on the toilet for long periods. This increases blood pooling and pain.   Move your bowels when your body has the urge; this will require less straining and will decrease pain and pressure.

## 2016-07-25 NOTE — H&P (Signed)
Primary Care Physician:  Odette Fraction, MD Primary Gastroenterologist:  Dr. Oneida Alar  Pre-Procedure History & Physical: HPI:  Deborah Mullins is a 65 y.o. female here for [place].  Past Medical History:  Diagnosis Date  . Allergic rhinitis   . Anxiety   . Arthritis   . Bipolar 1 disorder ([place])   . Cancer [facility])    breast   . Carpal tunnel syndrome of left wrist   . Chronic neck pain   . Chronic shoulder pain    L>R  . Depression   . GERD (gastroesophageal reflux disease)   . Headache(784.0)   . History of lung surgery   . Hyperlipidemia   . Hypertension   . Lung cancer ([place])   . Lung nodule 2009  . Pain in both feet   . PONV (postoperative nausea and vomiting)   . Prediabetes    a1c 6.4 12/16  . Shortness of breath    exertion    Past Surgical History:  Procedure Laterality Date  . BREAST SURGERY  2005   lumpectomy - left  . carpel tunnel Right   . CESAREAN SECTION  1971  . LUNG CANCER SURGERY Right 05/23/2012   reportedly clear  . TONSILLECTOMY    . TUBAL LIGATION      Prior to Admission medications   Medication Sig Start Date End Date Taking? Authorizing Provider  albuterol (PROVENTIL HFA;VENTOLIN HFA) 108 (90 Base) MCG/ACT inhaler Inhale 2 puffs into the lungs every 4 (four) hours as needed for wheezing or shortness of breath. 07/11/16  Yes Francine Graven, DO  FLUoxetine (PROZAC) 40 MG capsule Take 1 capsule (40 mg total) by mouth daily. 08/27/15 08/26/16 Yes Cloria Spring, MD  gabapentin (NEURONTIN) 300 MG capsule Take 1 capsule (300 mg total) by mouth 3 (three) times daily. Patient taking differently: Take 600 mg by mouth at bedtime.  08/02/15  Yes Penni Bombard, MD  gabapentin (NEURONTIN) 800 MG tablet Take 1 tablet (800 mg total) by mouth at bedtime. Patient taking differently: Take 800 mg by mouth every morning.  10/27/15  Yes Penni Bombard, MD  hydrochlorothiazide (HYDRODIURIL) 25 MG tablet TAKE 1 TABLET BY MOUTH EVERY DAY  11/22/15  Yes Susy Frizzle, MD  HYDROcodone-acetaminophen (NORCO/VICODIN) 5-325 MG tablet 1 or 2 tabs PO q8 hours prn pain 07/11/16  Yes Francine Graven, DO  ibuprofen (ADVIL,MOTRIN) 800 MG tablet Take 1 tablet (800 mg total) by mouth every 8 (eight) hours as needed. Patient taking differently: Take 800 mg by mouth every 8 (eight) hours as needed for mild pain.  06/26/16  Yes Mcarthur Rossetti, MD  pantoprazole (PROTONIX) 40 MG tablet TAKE 1 TABLET (40 MG TOTAL) BY MOUTH DAILY. 07/24/16  Yes Susy Frizzle, MD  polyethylene glycol powder (GLYCOLAX/MIRALAX) powder Take 17 g by mouth daily as needed (constipation). 02/05/13  Yes Darrol Jump, MD  polyethylene glycol-electrolytes (TRILYTE) 420 g solution Take 4,000 mLs by mouth as directed. 07/20/16  Yes Danie Binder, MD  rosuvastatin (CRESTOR) 40 MG tablet TAKE 1 TABLET BY MOUTH EVERY DAY 05/31/16  Yes Susy Frizzle, MD  methocarbamol (ROBAXIN) 500 MG tablet Take 2 tablets (1,000 mg total) by mouth 4 (four) times daily as needed for muscle spasms (muscle spasm/pain). Patient not taking: Reported on 07/13/2016 07/11/16   Francine Graven, DO  naproxen (NAPROSYN) 250 MG tablet Take 1 tablet (250 mg total) by mouth 2 (two) times daily as needed for mild pain or moderate pain (  take with food). Patient not taking: Reported on 07/13/2016 07/11/16   Francine Graven, DO    Allergies as of 07/18/2016 - Review Complete 07/12/2016  Allergen Reaction Noted  . Bee venom Anaphylaxis 05/21/2012  . Penicillins Rash     Family History  Problem Relation Age of Onset  . Heart disease Mother     CHF  . Physical abuse Mother   . Depression Mother   . Cancer Sister   . ADD / ADHD Sister   . Alcohol abuse Father   . Alcohol abuse Brother   . Mental illness Brother   . Cancer Sister   . Heart disease Sister   . Physical abuse Sister   . OCD Sister   . Mental illness Sister   . Anxiety disorder Neg Hx   . Bipolar disorder Neg Hx   .  Dementia Neg Hx   . Drug abuse Neg Hx   . Paranoid behavior Neg Hx   . Schizophrenia Neg Hx   . Seizures Neg Hx   . Sexual abuse Neg Hx     Social History   Social History  . Marital status: Married    Spouse name: Eddie Dibbles  . Number of children: 1  . Years of education: 8   Occupational History  . retired     Location manager   Social History Main Topics  . Smoking status: Former Smoker    Packs/day: 1.00    Years: 10.00    Types: Cigarettes    Quit date: 08/14/1974  . Smokeless tobacco: Never Used  . Alcohol use No  . Drug use: No  . Sexual activity: No   Other Topics Concern  . Not on file   Social History Narrative   Married.    Both pt and her husband have bipolar. She says he "Gets moody and leaves for a while" then comes back.    "Not very supportive."   Caffeine use- 2 cups/day    Review of Systems: See HPI, otherwise negative ROS   Physical Exam: BP 130/79   Temp 98.6 F (37 C) (Oral)   Resp (!) 27   SpO2 94%  General:   Alert,  pleasant and cooperative in NAD Head:  Normocephalic and atraumatic. Neck:  Supple; Lungs:  Clear throughout to auscultation.    Heart:  Regular rate and rhythm. Abdomen:  Soft, nontender and nondistended. Normal bowel sounds, without guarding, and without rebound.   Neurologic:  Alert and  oriented x4;  grossly normal neurologically.  Impression/Plan:     SCREENING  Plan:  1. TCS TODAY. DISCUSSED PROCEDURE, BENEFITS, & RISKS: < 1% chance of medication reaction, bleeding, perforation, or rupture of spleen/liver.

## 2016-07-25 NOTE — Op Note (Signed)
[facility] Patient Name: Deborah Mullins Procedure Date: 07/25/2016 8:13 AM MRN: [id number] Date of Birth: 03/22/1951 Attending MD: Barney Drain , MD CSN: [id number] Age: 65 Admit Type: Outpatient Procedure:                Colonoscopy, SCREENING Indications:              Screening for colorectal malignant neoplasm Providers:                Barney Drain, MD, Rosina Lowenstein, RN, Isabella Stalling,                            Technician Referring MD:             Cammie Mcgee. [place] Medicines:                Propofol per Anesthesia Complications:            No immediate complications. Estimated Blood Loss:     Estimated blood loss: none. Procedure:                Pre-Anesthesia Assessment:                           - Prior to the procedure, a History and Physical                            was performed, and patient medications and                            allergies were reviewed. The patient's tolerance of                            previous anesthesia was also reviewed. The risks                            and benefits of the procedure and the sedation                            options and risks were discussed with the patient.                            All questions were answered, and informed consent                            was obtained. Prior Anticoagulants: The patient has                            taken ibuprofen, last dose was 1 day prior to                            procedure. ASA Grade Assessment: II - A patient                            with mild systemic disease. After reviewing the  risks and benefits, the patient was deemed in                            satisfactory condition to undergo the procedure.                            After obtaining informed consent, the colonoscope                            was passed under direct vision. Throughout the                            procedure, the patient's blood pressure, pulse, and              oxygen saturations were monitored continuously. The                            EC-[id number] ([id number]) scope was introduced through                            the anus and advanced to the the cecum, identified                            by appendiceal orifice and ileocecal valve. The                            colonoscopy was somewhat difficult due to a                            tortuous colon. Successful completion of the                            procedure was aided by COLOWRAP. The patient                            tolerated the procedure well. The quality of the                            bowel preparation was good. The ileocecal valve,                            appendiceal orifice, and rectum were photographed. Scope In: 4:54:09 AM Scope Out: 8:54:06 AM Scope Withdrawal Time: 0 hours 11 minutes 32 seconds  Total Procedure Duration: 0 hours 17 minutes 47 seconds  Findings:      The digital rectal exam findings include non-thrombosed external       hemorrhoids.      The recto-sigmoid colon, sigmoid colon and descending colon were       significantly redundant.      The exam was otherwise without abnormality.      Non-bleeding internal hemorrhoids were found. The hemorrhoids were       moderate. Impression:               - Non-thrombosed external hemorrhoids found on  digital rectal exam.                           - Redundant LEFT colon.                           - The examination was otherwise normal.                           - Non-bleeding internal hemorrhoids. Moderate Sedation:      Per Anesthesia Care Recommendation:           - High fiber diet.                           - Continue present medications.                           - Repeat colonoscopy in 10 months for surveillance                            WITH MAC/COLOWRAP                           - Patient has a contact number available for                            emergencies. The  signs and symptoms of potential                            delayed complications were discussed with the                            patient. Return to normal activities tomorrow.                            Written discharge instructions were provided to the                            patient. Procedure Code(s):        --- Professional ---                           [phone], Colonoscopy, flexible; diagnostic, including                            collection of specimen(s) by brushing or washing,                            when performed (separate procedure) Diagnosis Code(s):        --- Professional ---                           Z12.11, Encounter for screening for malignant                            neoplasm of colon  K64.4, Residual hemorrhoidal skin tags                           K64.8, Other hemorrhoids                           Q43.8, Other specified congenital malformations of                            intestine CPT copyright 2016 [employer]. All rights reserved. The codes documented in this report are preliminary and upon coder review may  be revised to meet current compliance requirements. Barney Drain, MD Barney Drain, MD 07/25/2016 9:04:36 AM This report has been signed electronically. Number of Addenda: 0

## 2016-07-25 NOTE — Transfer of Care (Signed)
Immediate Anesthesia Transfer of Care Note  Patient: Deborah Mullins  Procedure(s) Performed: Procedure(s) with comments: COLONOSCOPY WITH PROPOFOL (N/A) - 8:15 AM  Patient Location: PACU  Anesthesia Type:MAC  Level of Consciousness: awake, oriented and patient cooperative  Airway & Oxygen Therapy: Patient Spontanous Breathing and Patient connected to nasal cannula oxygen  Post-op Assessment: Report given to RN and Post -op Vital signs reviewed and stable  Post vital signs: Reviewed and stable  Last Vitals:  Vitals:   07/25/16 0800 07/25/16 0815  BP: 114/75 121/75  Resp: 14 14  Temp:      Last Pain:  Vitals:   07/25/16 0638  TempSrc: Oral      Patients Stated Pain Goal: 5 (49/44/96 7591)  Complications: No apparent anesthesia complications

## 2016-07-25 NOTE — Anesthesia Postprocedure Evaluation (Signed)
Anesthesia Post Note  Patient: Deborah Mullins  Procedure(s) Performed: Procedure(s) (LRB): COLONOSCOPY WITH PROPOFOL (N/A)  Patient location during evaluation: PACU Anesthesia Type: MAC Level of consciousness: awake and alert and oriented Pain management: pain level controlled Vital Signs Assessment: post-procedure vital signs reviewed and stable Respiratory status: spontaneous breathing Cardiovascular status: blood pressure returned to baseline and stable Postop Assessment: no signs of nausea or vomiting Anesthetic complications: no    Last Vitals:  Vitals:   07/25/16 0800 07/25/16 0815  BP: 114/75 121/75  Resp: 14 14  Temp:      Last Pain:  Vitals:   07/25/16 0638  TempSrc: Oral                 ADAMS, AMY A

## 2016-07-25 NOTE — Anesthesia Preprocedure Evaluation (Signed)
Anesthesia Evaluation  Patient identified by MRN, date of birth, ID band Patient awake    Reviewed: Allergy & Precautions, H&P , NPO status , Patient's Chart, lab work & pertinent test results  History of Anesthesia Complications (+) PONV and history of anesthetic complications  Airway Mallampati: II  TM Distance: >3 FB Neck ROM: Full    Dental  (+) Teeth Intact, Dental Advisory Given   Pulmonary shortness of breath and with exertion, COPD (lung CA), former smoker,    breath sounds clear to auscultation       Cardiovascular hypertension,  Rhythm:Regular Rate:Normal     Neuro/Psych  Headaches, PSYCHIATRIC DISORDERS Anxiety Depression Bipolar Disorder    GI/Hepatic Neg liver ROS, GERD  ,  Endo/Other  negative endocrine ROSdiabetes (Pre-diabetes)  Renal/GU negative Renal ROS     Musculoskeletal   Abdominal   Peds  Hematology negative hematology ROS (+)   Anesthesia Other Findings   Reproductive/Obstetrics                             Anesthesia Physical Anesthesia Plan  ASA: III  Anesthesia Plan: MAC   Post-op Pain Management:    Induction: Intravenous  Airway Management Planned: Simple Face Mask  Additional Equipment:   Intra-op Plan:   Post-operative Plan:   Informed Consent: I have reviewed the patients History and Physical, chart, labs and discussed the procedure including the risks, benefits and alternatives for the proposed anesthesia with the patient or authorized representative who has indicated his/her understanding and acceptance.     Plan Discussed with:   Anesthesia Plan Comments:         Anesthesia Quick Evaluation

## 2016-07-28 ENCOUNTER — Encounter (HOSPITAL_COMMUNITY): Payer: Self-pay | Admitting: Gastroenterology

## 2016-08-15 ENCOUNTER — Ambulatory Visit (INDEPENDENT_AMBULATORY_CARE_PROVIDER_SITE_OTHER): Payer: 59 | Admitting: Orthopaedic Surgery

## 2016-08-15 DIAGNOSIS — G8929 Other chronic pain: Secondary | ICD-10-CM | POA: Diagnosis not present

## 2016-08-15 DIAGNOSIS — M25511 Pain in right shoulder: Secondary | ICD-10-CM

## 2016-08-15 DIAGNOSIS — M25512 Pain in left shoulder: Secondary | ICD-10-CM | POA: Diagnosis not present

## 2016-08-15 MED ORDER — IBUPROFEN 800 MG PO TABS: 800.0000 mg | ORAL_TABLET | Freq: Three times a day (TID) | ORAL | 3 refills | Status: DC | PRN

## 2016-08-15 NOTE — Progress Notes (Signed)
The patient is returning after bilateral intra-articular glenohumeral joint injections of her shoulders by Dr. Ernestina Patches. She said the injections really didn't help.  On examination she has still significant grind of both shoulders both the left and the right. This limits her motion and mobility.  I talked her about the possibility of sending her to someone to consider shoulder replacement surgery. She said she is not there yet. We'll have her try over-the-counter supplements such as Tumeric and glucosamine. If she decides that she will consider shoulder replacement surgery for give [place] a call and I can refer her to my partner Dr. Marlou Sa.

## 2016-09-16 ENCOUNTER — Encounter (HOSPITAL_COMMUNITY): Payer: Self-pay | Admitting: Emergency Medicine

## 2016-09-16 ENCOUNTER — Emergency Department (HOSPITAL_COMMUNITY)
Admission: EM | Admit: 2016-09-16 | Discharge: 2016-09-16 | Disposition: A | Discharge status: Home / Self Care | Payer: 59 | Attending: Emergency Medicine | Admitting: Emergency Medicine

## 2016-09-16 DIAGNOSIS — Z853 Personal history of malignant neoplasm of breast: Secondary | ICD-10-CM | POA: Diagnosis not present

## 2016-09-16 DIAGNOSIS — J019 Acute sinusitis, unspecified: Secondary | ICD-10-CM | POA: Diagnosis not present

## 2016-09-16 DIAGNOSIS — J449 Chronic obstructive pulmonary disease, unspecified: Secondary | ICD-10-CM | POA: Diagnosis not present

## 2016-09-16 DIAGNOSIS — Z85118 Personal history of other malignant neoplasm of bronchus and lung: Secondary | ICD-10-CM | POA: Diagnosis not present

## 2016-09-16 DIAGNOSIS — I10 Essential (primary) hypertension: Secondary | ICD-10-CM | POA: Insufficient documentation

## 2016-09-16 DIAGNOSIS — R05 Cough: Secondary | ICD-10-CM | POA: Diagnosis present

## 2016-09-16 DIAGNOSIS — Z87891 Personal history of nicotine dependence: Secondary | ICD-10-CM | POA: Insufficient documentation

## 2016-09-16 DIAGNOSIS — Z79899 Other long term (current) drug therapy: Secondary | ICD-10-CM | POA: Diagnosis not present

## 2016-09-16 MED ORDER — CEPHALEXIN 500 MG PO CAPS: 500.0000 mg | ORAL_CAPSULE | Freq: Four times a day (QID) | ORAL | 0 refills | Status: DC

## 2016-09-16 MED ORDER — IBUPROFEN 800 MG PO TABS
800.0000 mg | ORAL_TABLET | Freq: Once | ORAL | Status: AC
  Administered 2016-09-16: 800 mg via ORAL
  Filled 2016-09-16: qty 1

## 2016-09-16 NOTE — ED Triage Notes (Signed)
Pt reports cough, headache and nasal congestion x1 week with emesis after coughing.  Pt alert and oriented.

## 2016-09-16 NOTE — ED Provider Notes (Signed)
[facility] DEPT Provider Note   CSN: [id number] Arrival date & time: 09/16/16  1043     History   Chief Complaint Chief Complaint  Patient presents with  . Cough    HPI Deborah Mullins is a 66 y.o. female.  The history is provided by the patient. No language interpreter was used.  Cough  This is a new problem. The problem has not changed since onset.The cough is non-productive. There has been no fever. Associated symptoms include myalgias. She has tried nothing for the symptoms. The treatment provided no relief. Her past medical history does not include asthma.  Pt complains of muscle aches and extremity fatigue. Pt thinks she has a sinus infection.  Pt complains of sinus pressure and congestion  Past Medical History:  Diagnosis Date  . Allergic rhinitis   . Anxiety   . Arthritis   . Bipolar 1 disorder ([place])   . Cancer [facility])    breast   . Carpal tunnel syndrome of left wrist   . Chronic neck pain   . Chronic shoulder pain    L>R  . Depression   . GERD (gastroesophageal reflux disease)   . Headache(784.0)   . History of lung surgery   . Hyperlipidemia   . Hypertension   . Lung cancer ([place])   . Lung nodule 2009  . Pain in both feet   . PONV (postoperative nausea and vomiting)   . Prediabetes    a1c 6.4 12/16  . Shortness of breath    exertion    Patient Active Problem List   Diagnosis Date Noted  . Chronic pain of both shoulders 07/24/2016  . Prediabetes   . Anxiety   . Bipolar 1 disorder ([place])   . Depression   . Chronic back pain 11/07/2012  . Insomnia secondary to depression with anxiety 09/23/2012  . Unspecified vitamin D deficiency 09/23/2012  . COPD, mild ([place]) 07/01/2012  . Acute bronchitis 06/08/2012  . Adenocarcinoma of lung, stage 1 ([place]) 05/23/2012  . RECTAL BLEEDING 04/27/2010  . HEMORRHOIDS 03/11/2009  . ADENOCARCINOMA, BREAST 12/15/2008  . FIBROIDS, UTERUS 12/15/2008  . HYPERCHOLESTEROLEMIA 12/15/2008  . HYPERLIPIDEMIA 12/15/2008  .  Anxiety and depression 12/15/2008  . GASTROESOPHAGEAL REFLUX DISEASE 12/15/2008  . GASTRITIS 12/15/2008  . CONSTIPATION 12/15/2008  . FLATULENCE ERUCTATION AND GAS PAIN 12/15/2008  . ABDOMINAL PAIN 12/15/2008  . RECTAL BLEEDING, HX OF 12/15/2008    Past Surgical History:  Procedure Laterality Date  . BREAST SURGERY  2005   lumpectomy - left  . carpel tunnel Right   . CESAREAN SECTION  1971  . COLONOSCOPY WITH PROPOFOL N/A 07/25/2016   Procedure: COLONOSCOPY WITH PROPOFOL;  Surgeon: Danie Binder, MD;  Location: [facility];  Service: Endoscopy;  Laterality: N/A;  8:15 AM  . LUNG CANCER SURGERY Right 05/23/2012   reportedly clear  . TONSILLECTOMY    . TUBAL LIGATION      OB History    Gravida Para Term Preterm AB Living   '1 1 1     1   '$ SAB TAB Ectopic Multiple Live Births                   Home Medications    Prior to Admission medications   Medication Sig Start Date End Date Taking? Authorizing Provider  albuterol (PROVENTIL HFA;VENTOLIN HFA) 108 (90 Base) MCG/ACT inhaler Inhale 2 puffs into the lungs every 4 (four) hours as needed for wheezing or shortness of breath. 07/11/16  Francine Graven, DO  cephALEXin (KEFLEX) 500 MG capsule Take 1 capsule (500 mg total) by mouth 4 (four) times daily. 09/16/16   Fransico Meadow, PA-C  FLUoxetine (PROZAC) 40 MG capsule Take 1 capsule (40 mg total) by mouth daily. 08/27/15 08/26/16  Cloria Spring, MD  gabapentin (NEURONTIN) 300 MG capsule Take 1 capsule (300 mg total) by mouth 3 (three) times daily. Patient taking differently: Take 600 mg by mouth at bedtime.  08/02/15   Penni Bombard, MD  gabapentin (NEURONTIN) 800 MG tablet Take 1 tablet (800 mg total) by mouth at bedtime. Patient taking differently: Take 800 mg by mouth every morning.  10/27/15   Penni Bombard, MD  hydrochlorothiazide (HYDRODIURIL) 25 MG tablet TAKE 1 TABLET BY MOUTH EVERY DAY 11/22/15   Susy Frizzle, MD  HYDROcodone-acetaminophen (NORCO/VICODIN)  5-325 MG tablet 1 or 2 tabs PO q8 hours prn pain 07/11/16   Francine Graven, DO  ibuprofen (ADVIL,MOTRIN) 800 MG tablet Take 1 tablet (800 mg total) by mouth every 8 (eight) hours as needed. 08/15/16   Mcarthur Rossetti, MD  methocarbamol (ROBAXIN) 500 MG tablet Take 2 tablets (1,000 mg total) by mouth 4 (four) times daily as needed for muscle spasms (muscle spasm/pain). Patient not taking: Reported on 07/13/2016 07/11/16   Francine Graven, DO  naproxen (NAPROSYN) 250 MG tablet Take 1 tablet (250 mg total) by mouth 2 (two) times daily as needed for mild pain or moderate pain (take with food). Patient not taking: Reported on 07/13/2016 07/11/16   Francine Graven, DO  pantoprazole (PROTONIX) 40 MG tablet TAKE 1 TABLET (40 MG TOTAL) BY MOUTH DAILY. 07/24/16   Susy Frizzle, MD  polyethylene glycol powder (GLYCOLAX/MIRALAX) powder Take 17 g by mouth daily as needed (constipation). 02/05/13   Darrol Jump, MD  polyethylene glycol-electrolytes (TRILYTE) 420 g solution Take 4,000 mLs by mouth as directed. 07/20/16   Danie Binder, MD  rosuvastatin (CRESTOR) 40 MG tablet TAKE 1 TABLET BY MOUTH EVERY DAY 05/31/16   Susy Frizzle, MD    Family History Family History  Problem Relation Age of Onset  . Heart disease Mother     CHF  . Physical abuse Mother   . Depression Mother   . Cancer Sister   . ADD / ADHD Sister   . Alcohol abuse Father   . Alcohol abuse Brother   . Mental illness Brother   . Cancer Sister   . Heart disease Sister   . Physical abuse Sister   . OCD Sister   . Mental illness Sister   . Anxiety disorder Neg Hx   . Bipolar disorder Neg Hx   . Dementia Neg Hx   . Drug abuse Neg Hx   . Paranoid behavior Neg Hx   . Schizophrenia Neg Hx   . Seizures Neg Hx   . Sexual abuse Neg Hx     Social History Social History  Substance Use Topics  . Smoking status: Former Smoker    Packs/day: 1.00    Years: 10.00    Types: Cigarettes    Quit date: 08/14/1974  . Smokeless  tobacco: Never Used  . Alcohol use No     Allergies   Bee venom and Penicillins   Review of Systems Review of Systems  Respiratory: Positive for cough.   Musculoskeletal: Positive for myalgias.  All other systems reviewed and are negative.    Physical Exam Updated Vital Signs BP 134/89 (BP Location: Left Arm)  Pulse 83   Temp 97.8 F (36.6 C) (Oral)   Resp 18   Ht '5\' 3"'$  (1.6 m)   Wt 74.8 kg   SpO2 96%   BMI 29.23 kg/m   Physical Exam  Constitutional: She appears well-developed and well-nourished. No distress.  HENT:  Head: Normocephalic and atraumatic.  Eyes: Conjunctivae are normal.  Neck: Neck supple.  Cardiovascular: Normal rate and regular rhythm.   No murmur heard. Pulmonary/Chest: Effort normal and breath sounds normal. No respiratory distress.  Abdominal: Soft. There is no tenderness.  Musculoskeletal: She exhibits no edema.  Neurological: She is alert.  Skin: Skin is warm and dry.  Psychiatric: She has a normal mood and affect.  Nursing note and vitals reviewed.  (Pt reports she thinks she is wheezing,  During exam pt made some throat noises.  I had her breath through her noise and close her mouth and noise stopped)   ED Treatments / Results  Labs (all labs ordered are listed, but only abnormal results are displayed) Labs Reviewed - No data to display  EKG  EKG Interpretation None       Radiology No results found.  Procedures Procedures (including critical care time)  Medications Ordered in ED Medications  ibuprofen (ADVIL,MOTRIN) tablet 800 mg (not administered)     Initial Impression / Assessment and Plan / ED Course  I have reviewed the triage vital signs and the nursing notes.  Pertinent labs & imaging results that were available during my care of the patient were reviewed by me and considered in my medical decision making (see chart for details).       Final Clinical Impressions(s) / ED Diagnoses   Final diagnoses:    Acute sinusitis, recurrence not specified, unspecified location    New Prescriptions New Prescriptions   CEPHALEXIN (KEFLEX) 500 MG CAPSULE    Take 1 capsule (500 mg total) by mouth 4 (four) times daily.  An After Visit Summary was printed and given to the patient.   Hollace Kinnier [place], PA-C 09/16/16 [place], MD 09/17/16 [phone]

## 2016-09-16 NOTE — Discharge Instructions (Signed)
See your Physician next week for evaluation

## 2016-10-11 ENCOUNTER — Ambulatory Visit (INDEPENDENT_AMBULATORY_CARE_PROVIDER_SITE_OTHER): Payer: 59 | Admitting: Pediatrics

## 2016-10-11 ENCOUNTER — Encounter: Payer: Self-pay | Admitting: Pediatrics

## 2016-10-11 VITALS — BP 129/89 | HR 72 | Temp 97.0°F | Resp 18 | Ht 64.0 in | Wt 169.2 lb

## 2016-10-11 DIAGNOSIS — F419 Anxiety disorder, unspecified: Principal | ICD-10-CM

## 2016-10-11 DIAGNOSIS — F418 Other specified anxiety disorders: Secondary | ICD-10-CM | POA: Diagnosis not present

## 2016-10-11 DIAGNOSIS — J309 Allergic rhinitis, unspecified: Secondary | ICD-10-CM | POA: Diagnosis not present

## 2016-10-11 DIAGNOSIS — R7303 Prediabetes: Secondary | ICD-10-CM | POA: Diagnosis not present

## 2016-10-11 DIAGNOSIS — G629 Polyneuropathy, unspecified: Secondary | ICD-10-CM | POA: Diagnosis not present

## 2016-10-11 DIAGNOSIS — I10 Essential (primary) hypertension: Secondary | ICD-10-CM | POA: Diagnosis not present

## 2016-10-11 DIAGNOSIS — F329 Major depressive disorder, single episode, unspecified: Secondary | ICD-10-CM

## 2016-10-11 LAB — BAYER DCA HB A1C WAIVED: HB A1C: 6.6 % (ref <7.0)

## 2016-10-11 MED ORDER — CETIRIZINE HCL 10 MG PO TABS: 10.0000 mg | ORAL_TABLET | Freq: Every day | ORAL | 11 refills | Status: DC

## 2016-10-11 MED ORDER — FLUTICASONE PROPIONATE 50 MCG/ACT NA SUSP
2.0000 | Freq: Every day | NASAL | 6 refills | Status: AC
Start: 2016-10-11 — End: ?

## 2016-10-11 MED ORDER — GABAPENTIN 800 MG PO TABS: 800.0000 mg | ORAL_TABLET | Freq: Two times a day (BID) | ORAL | 4 refills | Status: DC

## 2016-10-11 MED ORDER — CAPSAICIN-MENTHOL-METHYL SAL 0.025-1-12 % EX CREA: 1.0000 "application " | TOPICAL_CREAM | Freq: Two times a day (BID) | CUTANEOUS | 1 refills | Status: DC | PRN

## 2016-10-11 MED ORDER — FLUOXETINE HCL 40 MG PO CAPS: 40.0000 mg | ORAL_CAPSULE | Freq: Every day | ORAL | 2 refills | Status: DC

## 2016-10-11 NOTE — Progress Notes (Signed)
Subjective:   Patient ID: Deborah Mullins, female    DOB: [birth year], 66 y.o.   MRN: [id number] CC: New Patient (Initial Visit) follow up multiple med problems HPI: Deborah Mullins is a 66 y.o. female presenting for New Patient (Initial Visit)  H/o depression and anxiety: Not fighting with husband as much since being on SSRI Has been on same dose for a while  Was started on keflex 3 weeks ago for sinus pressure and congestion During the day has pressure in her ears, temples Some swollen glands No fevers now  Having trouble sleeping for months to years Has had sleep study she says, no sleep apnea  Stays active during the day  Back has been hurting Has restless leg syndrome, takes gabapentin  Has had some SOB since lung surgery for lung cancer  Past Medical History:  Diagnosis Date  . Allergic rhinitis   . Allergy   . Anxiety   . Arthritis   . Bipolar 1 disorder ([place])   . Cancer [facility])    breast   . Carpal tunnel syndrome of left wrist   . Chronic neck pain   . Chronic shoulder pain    L>R  . Depression   . GERD (gastroesophageal reflux disease)   . Headache(784.0)   . History of lung surgery   . Hyperlipidemia   . Hypertension   . Lung cancer ([place])   . Lung nodule 2009  . Pain in both feet   . PONV (postoperative nausea and vomiting)   . Prediabetes    a1c 6.4 12/16  . Shortness of breath    exertion   Family History  Problem Relation Age of Onset  . Heart disease Mother     CHF  . Physical abuse Mother   . Depression Mother   . Cancer Sister   . ADD / ADHD Sister   . Alcohol abuse Father   . Alcohol abuse Brother   . Mental illness Brother   . Cancer Sister   . Heart disease Sister   . Physical abuse Sister   . OCD Sister   . Mental illness Sister   . Anxiety disorder Neg Hx   . Bipolar disorder Neg Hx   . Dementia Neg Hx   . Drug abuse Neg Hx   . Paranoid behavior Neg Hx   . Schizophrenia Neg Hx   . Seizures Neg Hx   . Sexual abuse Neg Hx     Social History   Social History  . Marital status: Married    Spouse name: Eddie Dibbles  . Number of children: 1  . Years of education: 8   Occupational History  . retired     Location manager   Social History Main Topics  . Smoking status: Former Smoker    Packs/day: 1.00    Years: 10.00    Types: Cigarettes, E-cigarettes    Quit date: 08/14/1974  . Smokeless tobacco: Never Used  . Alcohol use No  . Drug use: Yes    Types: Marijuana  . Sexual activity: No   Other Topics Concern  . None   Social History Narrative   Married.    Both pt and her husband have bipolar. She says he "Gets moody and leaves for a while" then comes back.    "Not very supportive."   Caffeine use- 2 cups/day   ROS: All systems negative other than what is in HPI  Objective:    BP 129/89   Pulse  72   Temp 97 F (36.1 C) (Oral)   Resp 18   Ht _0  (1.626 m)   Wt 169 lb 3.2 oz (76.7 kg)   SpO2 98%   BMI 29.04 kg/m   Wt Readings from Last 3 Encounters:  10/11/16 169 lb 3.2 oz (76.7 kg)  09/16/16 165 lb (74.8 kg)  07/20/16 165 lb (74.8 kg)    Gen: NAD, alert, cooperative with exam, NCAT EYES: EOMI, no conjunctival injection, or no icterus ENT:  TMs pearly gray b/l, OP without erythema LYMPH: no cervical LAD CV: NRRR, normal S1/S2, no murmur, distal pulses 2+ b/l Resp: CTABL, no wheezes, normal WOB Abd: +BS, soft, NTND. no guarding or organomegaly Ext: No edema, warm Neuro: Alert and oriented MSK: normal muscle bulk Psych: well groomed, normal affect  Assessment & Plan:  Prisca was seen today for new patient (initial visit), f/u multiple med problems  Diagnoses and all orders for this visit:  Anxiety and depression Well controlled with below, continue -     FLUoxetine (PROZAC) 40 MG capsule; Take 1 capsule (40 mg total) by mouth daily.  Prediabetes Recheck A1c -     Bayer DCA Hb A1c Waived  Allergic rhinitis, unspecified chronicity, unspecified seasonality, unspecified  trigger Sinus rinses BID -     cetirizine (ZYRTEC) 10 MG tablet; Take 1 tablet (10 mg total) by mouth daily. -     fluticasone (FLONASE) 50 MCG/ACT nasal spray; Place 2 sprays into both nostrils daily.  Neuropathy (HCC) Increase gabapentin to 832m BID -     gabapentin (NEURONTIN) 800 MG tablet; Take 1 tablet (800 mg total) by mouth 2 (two) times daily. -     CBC with Differential/Platelet -     Capsaicin-Menthol-Methyl Sal (CAPSAICIN-METHYL SAL-MENTHOL) 0.025-1-12 % CREA; Apply 1 application topically 2 (two) times daily as needed.  Essential hypertension Adequate control  Check labs Cont HCTZ -     BMP8+EGFR  Flu shot declined Follow up plan: Return in about 4 weeks (around 11/08/2016) for follow up. CAssunta Found MD [place]

## 2016-10-11 NOTE — Patient Instructions (Addendum)
[place] Appointment: [phone]  Netipot with distilled water 2-3 times a day to clear out sinuses Or Normal saline nasal spray Flonase steroid nasal spray 2 sprays each side daily Antihistamine daily such as cetirizine every day  For restless legs: Stretch every day before bed For the burning and tingling in legs try topical Capsacain cream. Make sure to wash hands after putting cream on.

## 2016-10-12 LAB — BMP8+EGFR
BUN / CREAT RATIO: 17 (ref 12–28)
BUN: 18 mg/dL (ref 8–27)
CO2: 29 mmol/L (ref 18–29)
CREATININE: 1.03 mg/dL — AB (ref 0.57–1.00)
Calcium: 10 mg/dL (ref 8.7–10.3)
Chloride: 96 mmol/L (ref 96–106)
GFR calc Af Amer: 66 mL/min/{1.73_m2} (ref >59)
GFR, EST NON AFRICAN AMERICAN: 58 mL/min/{1.73_m2} — AB (ref >59)
GLUCOSE: 88 mg/dL (ref 65–99)
Potassium: 3.6 mmol/L (ref 3.5–5.2)
Sodium: 141 mmol/L (ref 134–144)

## 2016-10-12 LAB — CBC WITH DIFFERENTIAL/PLATELET
BASOS ABS: 0 10*3/uL (ref 0.0–0.2)
Basos: 1 %
EOS (ABSOLUTE): 0.1 10*3/uL (ref 0.0–0.4)
EOS: 2 %
HEMATOCRIT: 39.3 % (ref 34.0–46.6)
HEMOGLOBIN: 13.3 g/dL (ref 11.1–15.9)
IMMATURE GRANS (ABS): 0 10*3/uL (ref 0.0–0.1)
IMMATURE GRANULOCYTES: 0 %
LYMPHS: 35 %
Lymphocytes Absolute: 2.3 10*3/uL (ref 0.7–3.1)
MCH: 29.8 pg (ref 26.6–33.0)
MCHC: 33.8 g/dL (ref 31.5–35.7)
MCV: 88 fL (ref 79–97)
MONOCYTES: 11 %
Monocytes Absolute: 0.8 10*3/uL (ref 0.1–0.9)
Neutrophils Absolute: 3.4 10*3/uL (ref 1.4–7.0)
Neutrophils: 51 %
Platelets: 263 10*3/uL (ref 150–379)
RBC: 4.47 x10E6/uL (ref 3.77–5.28)
RDW: 13.6 % (ref 12.3–15.4)
WBC: 6.6 10*3/uL (ref 3.4–10.8)

## 2016-10-16 ENCOUNTER — Telehealth: Payer: Self-pay | Admitting: Family Medicine

## 2016-10-16 ENCOUNTER — Other Ambulatory Visit: Payer: Self-pay | Admitting: Pediatrics

## 2016-10-16 DIAGNOSIS — E119 Type 2 diabetes mellitus without complications: Secondary | ICD-10-CM

## 2016-10-16 MED ORDER — METFORMIN HCL 500 MG PO TABS: 500.0000 mg | ORAL_TABLET | Freq: Two times a day (BID) | ORAL | 5 refills | Status: DC

## 2016-10-16 NOTE — Telephone Encounter (Signed)
Pt aware.

## 2016-10-31 ENCOUNTER — Other Ambulatory Visit: Payer: Self-pay | Admitting: Pediatrics

## 2016-10-31 ENCOUNTER — Telehealth: Payer: Self-pay | Admitting: Pediatrics

## 2016-10-31 DIAGNOSIS — Z1231 Encounter for screening mammogram for malignant neoplasm of breast: Secondary | ICD-10-CM

## 2016-10-31 MED ORDER — ONETOUCH ULTRASOFT LANCETS MISC: 1 refills | Status: AC

## 2016-10-31 MED ORDER — ONETOUCH ULTRA SYSTEM W/DEVICE KIT: 1.0000 | PACK | Freq: Once | 0 refills | Status: AC

## 2016-10-31 MED ORDER — BLOOD GLUCOSE MONITOR KIT: PACK | 0 refills | Status: DC

## 2016-10-31 MED ORDER — GLUCOSE BLOOD VI STRP: ORAL_STRIP | 1 refills | Status: DC

## 2016-10-31 NOTE — Telephone Encounter (Signed)
Glucometer and supplies sent to pharmacy

## 2016-11-01 ENCOUNTER — Telehealth: Payer: Self-pay | Admitting: Pediatrics

## 2016-11-01 DIAGNOSIS — E119 Type 2 diabetes mellitus without complications: Secondary | ICD-10-CM

## 2016-11-02 MED ORDER — BLOOD GLUCOSE MONITOR KIT: PACK | 0 refills | Status: DC

## 2016-11-02 MED ORDER — GLUCOSE BLOOD VI STRP: ORAL_STRIP | 1 refills | Status: DC

## 2016-11-02 NOTE — Telephone Encounter (Signed)
LMOVM that Rx is at front desk

## 2016-11-02 NOTE — Telephone Encounter (Signed)
Printed, not able to E-prescribe, possible to call in? Let pt know. She doesn't need to check every day, when she does check should be before breakfast or dinner. Let m eknow if sugar levels are increasing

## 2016-11-08 ENCOUNTER — Encounter (HOSPITAL_COMMUNITY): Payer: Self-pay

## 2016-11-08 ENCOUNTER — Ambulatory Visit (HOSPITAL_COMMUNITY)
Admission: RE | Admit: 2016-11-08 | Discharge: 2016-11-08 | Disposition: A | Discharge status: Home / Self Care | Payer: Medicare Other | Source: Ambulatory Visit | Attending: Pediatrics | Admitting: Pediatrics

## 2016-11-08 ENCOUNTER — Other Ambulatory Visit: Payer: Self-pay | Admitting: Family Medicine

## 2016-11-08 DIAGNOSIS — Z1231 Encounter for screening mammogram for malignant neoplasm of breast: Secondary | ICD-10-CM | POA: Insufficient documentation

## 2016-11-08 HISTORY — DX: Personal history of irradiation: Z92.3

## 2016-11-23 ENCOUNTER — Ambulatory Visit (INDEPENDENT_AMBULATORY_CARE_PROVIDER_SITE_OTHER): Payer: Medicare Other | Admitting: Physician Assistant

## 2016-11-23 ENCOUNTER — Encounter: Payer: Self-pay | Admitting: Physician Assistant

## 2016-11-23 DIAGNOSIS — E119 Type 2 diabetes mellitus without complications: Secondary | ICD-10-CM

## 2016-11-23 NOTE — Progress Notes (Signed)
Patient ID: Deborah Mullins MRN: [id number], DOB: 1951/06/09, 66 y.o. Date of Encounter: 11/23/2016, 12:03 PM    Chief Complaint:  Chief Complaint  Patient presents with  . discuss diabetes     HPI: 66 y.o. year old female presents to Re-Establish Care.   Says she saw me years ago.   Recently went to a another office because that is where her husband goes and thought it would be simpler to go to the same office that he goes to.  However has decided that she wants to return to coming to see me for her medical care.  Says that the new office that she recently went to gave her the new diagnosis of diabetes and started her on metformin.  Says "that's all she did ". Says that "they did not give me any kind of testing kit" etc.  So patient discussed that with her pharmacy. Patient says that she then called the doctor's office about getting this and says that it was multiple times back and forth just trying to get that one little thing.  Coming back to reestablish care.  She is taking metformin 500 mg twice a day. Says that she has decreased the sugar and sweets in her diet.  However says that it is hard to give up her bread-- that she "has to have toast or bagel in the morning".  Later in the conversation she is talking about wanting me to recheck her cholesterol and says that she had a piece of cake with her coffee this morning (!!)  She does bring in blood sugar log -- fasting morning readings--- 130, 113, 105, 107, 122, 120.  Says recently she got tired of writing them down so those are all just in her meter which she does not have with her today but says they are mostly in the 110s.  Says that she is very active and that she and her husband clean [place] at night.  Interested in rechecking her cholesterol but is not fasting today.  No other specific concerns to address today.     Home Meds:   Outpatient Medications Prior to Visit  Medication Sig Dispense Refill  . albuterol  (PROVENTIL HFA;VENTOLIN HFA) 108 (90 Base) MCG/ACT inhaler Inhale 2 puffs into the lungs every 4 (four) hours as needed for wheezing or shortness of breath. 1 Inhaler 0  . blood glucose meter kit and supplies KIT Check up to twice a day before meals 1 each 0  . Capsaicin-Menthol-Methyl Sal (CAPSAICIN-METHYL SAL-MENTHOL) 0.025-1-12 % CREA Apply 1 application topically 2 (two) times daily as needed. 56.6 g 1  . cetirizine (ZYRTEC) 10 MG tablet Take 1 tablet (10 mg total) by mouth daily. 30 tablet 11  . FLUoxetine (PROZAC) 40 MG capsule Take 1 capsule (40 mg total) by mouth daily. 90 capsule 2  . fluticasone (FLONASE) 50 MCG/ACT nasal spray Place 2 sprays into both nostrils daily. 16 g 6  . gabapentin (NEURONTIN) 800 MG tablet Take 1 tablet (800 mg total) by mouth 2 (two) times daily. 60 tablet 4  . glucose blood test strip Check up to twice a day 100 each 1  . hydrochlorothiazide (HYDRODIURIL) 25 MG tablet TAKE 1 TABLET BY MOUTH EVERY DAY 90 tablet 4  . ibuprofen (ADVIL,MOTRIN) 800 MG tablet Take 1 tablet (800 mg total) by mouth every 8 (eight) hours as needed. 90 tablet 3  . Lancets (ONETOUCH ULTRASOFT) lancets Use as instructed 100 each 1  . metFORMIN (GLUCOPHAGE) 500  MG tablet Take 1 tablet (500 mg total) by mouth 2 (two) times daily with a meal. 60 tablet 5  . naproxen (NAPROSYN) 250 MG tablet Take 1 tablet (250 mg total) by mouth 2 (two) times daily as needed for mild pain or moderate pain (take with food). 14 tablet 0  . pantoprazole (PROTONIX) 40 MG tablet TAKE 1 TABLET (40 MG TOTAL) BY MOUTH DAILY. 90 tablet 2  . polyethylene glycol powder (GLYCOLAX/MIRALAX) powder Take 17 g by mouth daily as needed (constipation). 850 g 0  . polyethylene glycol-electrolytes (TRILYTE) 420 g solution Take 4,000 mLs by mouth as directed. 4000 mL 0  . rosuvastatin (CRESTOR) 40 MG tablet TAKE 1 TABLET BY MOUTH EVERY DAY 90 tablet 0   No facility-administered medications prior to visit.     Allergies:    Allergies  Allergen Reactions  . Bee Venom Anaphylaxis  . Penicillins Rash    Has patient had a PCN reaction causing immediate rash, facial/tongue/throat swelling, SOB or lightheadedness with hypotension: Yes Has patient had a PCN reaction causing severe rash involving mucus membranes or skin necrosis: No Has patient had a PCN reaction that required hospitalization No Has patient had a PCN reaction occurring within the last 10 years: No If all of the above answers are "NO", then may proceed with Cephalosporin use.        Review of Systems: See HPI for pertinent ROS. All other ROS negative.    Physical Exam: Blood pressure 104/62, pulse 90, temperature 97.5 F (36.4 C), temperature source Oral, resp. rate 14, weight 162 lb 3.2 oz (73.6 kg), SpO2 97 %., Body mass index is 27.84 kg/m. General: WNWD WF.  Appears in no acute distress. Neck: Supple. No thyromegaly. No lymphadenopathy. Lungs: Clear bilaterally to auscultation without wheezes, rales, or rhonchi. Breathing is unlabored. Heart: Regular rhythm. No murmurs, rubs, or gallops. Abdomen: Soft, non-tender, non-distended with normoactive bowel sounds. No hepatomegaly. No rebound/guarding. No obvious abdominal masses. Msk:  Strength and tone normal for age. Extremities/Skin: Warm and dry. No edema. Neuro: Alert and oriented X 3. Moves all extremities spontaneously. Gait is normal. CNII-XII grossly in tact. Psych:  Responds to questions appropriately with a normal affect.     ASSESSMENT AND PLAN:  66 y.o. year old female with   1. Controlled type 2 diabetes mellitus without complication, without long-term current use of insulin (HCC) Diabetes is currently controlled with metformin 500 twice a day and her diet changes. Reviewed that her last A1c was 10/11/16. She is wanting to recheck her cholesterol etc. She is not fasting today. I recommend that she schedule an office visit for after 01/08/17 at which time follow-up A1c will be  due and we can recheck that in addition to checking fasting lipid etc.  She is scheduling office visit for early morning so she can come fasting to that appointment and will schedule this for the beginning of June. Also discussed importance of having routine f/u with one provider instead of switching around to different providers. -- will remind her of this at f/u OV.     [address] [place], [place], [facility] 11/23/2016 12:03 PM

## 2016-11-29 ENCOUNTER — Other Ambulatory Visit: Payer: Self-pay | Admitting: Family Medicine

## 2016-11-29 ENCOUNTER — Emergency Department (HOSPITAL_COMMUNITY)
Admission: EM | Admit: 2016-11-29 | Discharge: 2016-11-30 | Disposition: A | Discharge status: Home / Self Care | Payer: Medicare Other | Attending: Emergency Medicine | Admitting: Emergency Medicine

## 2016-11-29 DIAGNOSIS — Z79899 Other long term (current) drug therapy: Secondary | ICD-10-CM | POA: Diagnosis not present

## 2016-11-29 DIAGNOSIS — Z87891 Personal history of nicotine dependence: Secondary | ICD-10-CM | POA: Insufficient documentation

## 2016-11-29 DIAGNOSIS — Z85118 Personal history of other malignant neoplasm of bronchus and lung: Secondary | ICD-10-CM | POA: Diagnosis not present

## 2016-11-29 DIAGNOSIS — R0602 Shortness of breath: Secondary | ICD-10-CM | POA: Diagnosis not present

## 2016-11-29 DIAGNOSIS — R05 Cough: Secondary | ICD-10-CM | POA: Diagnosis not present

## 2016-11-29 DIAGNOSIS — Z9109 Other allergy status, other than to drugs and biological substances: Secondary | ICD-10-CM

## 2016-11-29 DIAGNOSIS — Z853 Personal history of malignant neoplasm of breast: Secondary | ICD-10-CM | POA: Diagnosis not present

## 2016-11-29 DIAGNOSIS — J449 Chronic obstructive pulmonary disease, unspecified: Secondary | ICD-10-CM | POA: Diagnosis not present

## 2016-11-29 DIAGNOSIS — T7840XA Allergy, unspecified, initial encounter: Secondary | ICD-10-CM | POA: Insufficient documentation

## 2016-11-29 DIAGNOSIS — E119 Type 2 diabetes mellitus without complications: Secondary | ICD-10-CM | POA: Diagnosis not present

## 2016-11-29 DIAGNOSIS — Z7984 Long term (current) use of oral hypoglycemic drugs: Secondary | ICD-10-CM | POA: Insufficient documentation

## 2016-11-29 DIAGNOSIS — I10 Essential (primary) hypertension: Secondary | ICD-10-CM | POA: Diagnosis not present

## 2016-11-29 NOTE — ED Provider Notes (Signed)
[facility] DEPT Provider Note   CSN: [id number] Arrival date & time: 11/29/16  2352  By signing my name below, I, Neta Mends, attest that this documentation has been prepared under the direction and in the presence of Orpah Greek, MD . Electronically Signed: Neta Mends, ED Scribe. 11/30/2016. 12:08 AM.    History   Chief Complaint Chief Complaint  Patient presents with  . Cough  . Sore Throat   The history is provided by the patient. No language interpreter was used.   HPI Comments:  Deborah Mullins is a 66 y.o. female who presents to the Emergency Department complaining of a persistent cough for the last day. Her cough has been productive of sputum. Pt complains of associated SOB, sore throat. Pt has been taking Zyrtec, Flonase and Pro-air regularly for allergies. No alleviating factors noted. Pt denies other associated symptoms.   Past Medical History:  Diagnosis Date  . Allergic rhinitis   . Allergy   . Anxiety   . Arthritis   . Bipolar 1 disorder ([place])   . Cancer [facility]) 2004   breast   . Carpal tunnel syndrome of left wrist   . Chronic neck pain   . Chronic shoulder pain    L>R  . Depression   . GERD (gastroesophageal reflux disease)   . Headache(784.0)   . History of lung surgery   . Hyperlipidemia   . Hypertension   . Lung cancer ([place])   . Lung nodule 2009  . Pain in both feet   . Personal history of radiation therapy 2004  . PONV (postoperative nausea and vomiting)   . Prediabetes    a1c 6.4 12/16  . Shortness of breath    exertion    Patient Active Problem List   Diagnosis Date Noted  . Controlled type 2 diabetes mellitus without complication, without long-term current use of insulin ([place]) 11/23/2016  . Chronic pain of both shoulders 07/24/2016  . Prediabetes   . Anxiety   . Bipolar 1 disorder ([place])   . Depression   . Chronic back pain 11/07/2012  . Insomnia secondary to depression with anxiety 09/23/2012  .  Unspecified vitamin D deficiency 09/23/2012  . COPD, mild ([place]) 07/01/2012  . Acute bronchitis 06/08/2012  . Adenocarcinoma of lung, stage 1 ([place]) 05/23/2012  . RECTAL BLEEDING 04/27/2010  . HEMORRHOIDS 03/11/2009  . ADENOCARCINOMA, BREAST 12/15/2008  . FIBROIDS, UTERUS 12/15/2008  . HYPERCHOLESTEROLEMIA 12/15/2008  . HYPERLIPIDEMIA 12/15/2008  . Anxiety and depression 12/15/2008  . GASTROESOPHAGEAL REFLUX DISEASE 12/15/2008  . GASTRITIS 12/15/2008  . CONSTIPATION 12/15/2008  . FLATULENCE ERUCTATION AND GAS PAIN 12/15/2008  . ABDOMINAL PAIN 12/15/2008  . RECTAL BLEEDING, HX OF 12/15/2008    Past Surgical History:  Procedure Laterality Date  . BREAST SURGERY  2005   lumpectomy - left  . carpel tunnel Right   . CESAREAN SECTION  1971  . COLONOSCOPY WITH PROPOFOL N/A 07/25/2016   Procedure: COLONOSCOPY WITH PROPOFOL;  Surgeon: Danie Binder, MD;  Location: [facility];  Service: Endoscopy;  Laterality: N/A;  8:15 AM  . EYE SURGERY    . LUNG CANCER SURGERY Right 05/23/2012   reportedly clear  . TONSILLECTOMY    . TUBAL LIGATION      OB History    Gravida Para Term Preterm AB Living   _0 SAB TAB Ectopic Multiple Live Births  Home Medications    Prior to Admission medications   Medication Sig Start Date End Date Taking? Authorizing Provider  albuterol (PROVENTIL HFA;VENTOLIN HFA) 108 (90 Base) MCG/ACT inhaler Inhale 2 puffs into the lungs every 4 (four) hours as needed for wheezing or shortness of breath. 07/11/16   Francine Graven, DO  blood glucose meter kit and supplies KIT Check up to twice a day before meals 11/02/16   Eustaquio Maize, MD  Capsaicin-Menthol-Methyl Sal (CAPSAICIN-METHYL SAL-MENTHOL) 0.025-1-12 % CREA Apply 1 application topically 2 (two) times daily as needed. 10/11/16   Eustaquio Maize, MD  cetirizine (ZYRTEC) 10 MG tablet Take 1 tablet (10 mg total) by mouth daily. 10/11/16   Eustaquio Maize, MD  FLUoxetine (PROZAC) 40  MG capsule Take 1 capsule (40 mg total) by mouth daily. 10/11/16 10/11/17  Eustaquio Maize, MD  fluticasone (FLONASE) 50 MCG/ACT nasal spray Place 2 sprays into both nostrils daily. 10/11/16   Eustaquio Maize, MD  gabapentin (NEURONTIN) 800 MG tablet Take 1 tablet (800 mg total) by mouth 2 (two) times daily. 10/11/16   Eustaquio Maize, MD  glucose blood test strip Check up to twice a day 11/02/16   Eustaquio Maize, MD  hydrochlorothiazide (HYDRODIURIL) 25 MG tablet TAKE 1 TABLET BY MOUTH EVERY DAY 11/22/15   Susy Frizzle, MD  ibuprofen (ADVIL,MOTRIN) 800 MG tablet Take 1 tablet (800 mg total) by mouth every 8 (eight) hours as needed. 08/15/16   Mcarthur Rossetti, MD  Lancets [facility] ULTRASOFT) lancets Use as instructed 10/31/16   Eustaquio Maize, MD  metFORMIN (GLUCOPHAGE) 500 MG tablet Take 1 tablet (500 mg total) by mouth 2 (two) times daily with a meal. 10/16/16   Eustaquio Maize, MD  naproxen (NAPROSYN) 250 MG tablet Take 1 tablet (250 mg total) by mouth 2 (two) times daily as needed for mild pain or moderate pain (take with food). 07/11/16   Francine Graven, DO  pantoprazole (PROTONIX) 40 MG tablet TAKE 1 TABLET (40 MG TOTAL) BY MOUTH DAILY. 07/24/16   Susy Frizzle, MD  polyethylene glycol powder (GLYCOLAX/MIRALAX) powder Take 17 g by mouth daily as needed (constipation). 02/05/13   Darrol Jump, MD  polyethylene glycol-electrolytes (TRILYTE) 420 g solution Take 4,000 mLs by mouth as directed. 07/20/16   Danie Binder, MD  rosuvastatin (CRESTOR) 40 MG tablet TAKE 1 TABLET BY MOUTH EVERY DAY 11/29/16   Orlena Sheldon, PA-C    Family History Family History  Problem Relation Age of Onset  . Heart disease Mother     CHF  . Physical abuse Mother   . Depression Mother   . Cancer Sister   . ADD / ADHD Sister   . Alcohol abuse Father   . Alcohol abuse Brother   . Mental illness Brother   . Cancer Sister   . Heart disease Sister   . Physical abuse Sister   . OCD Sister   . Mental  illness Sister   . Anxiety disorder Neg Hx   . Bipolar disorder Neg Hx   . Dementia Neg Hx   . Drug abuse Neg Hx   . Paranoid behavior Neg Hx   . Schizophrenia Neg Hx   . Seizures Neg Hx   . Sexual abuse Neg Hx     Social History Social History  Substance Use Topics  . Smoking status: Former Smoker    Packs/day: 1.00    Years: 10.00    Types: Cigarettes, E-cigarettes  Quit date: 08/14/1974  . Smokeless tobacco: Never Used  . Alcohol use No     Allergies   Bee venom and Penicillins   Review of Systems Review of Systems  HENT: Positive for sore throat.   Respiratory: Positive for cough and shortness of breath.   All other systems reviewed and are negative.    Physical Exam Updated Vital Signs BP 127/74   Pulse (!) 110   Temp 97.9 F (36.6 C) (Oral)   Resp 20   Ht _0  (1.6 m)   Wt 161 lb (73 kg)   SpO2 97%   BMI 28.52 kg/m   Physical Exam  Constitutional: She is oriented to person, place, and time. She appears well-developed and well-nourished. No distress.  HENT:  Head: Normocephalic and atraumatic.  Right Ear: Hearing normal.  Left Ear: Hearing normal.  Nose: Nose normal.  Mouth/Throat: Oropharynx is clear and moist and mucous membranes are normal.  Eyes: Conjunctivae and EOM are normal. Pupils are equal, round, and reactive to light.  Neck: Normal range of motion. Neck supple.  Cardiovascular: Regular rhythm, S1 normal and S2 normal.  Exam reveals no gallop and no friction rub.   No murmur heard. Pulmonary/Chest: Effort normal. No respiratory distress. She exhibits no tenderness.  Diminished breath sounds, slight wheezes.  Abdominal: Soft. Normal appearance and bowel sounds are normal. There is no hepatosplenomegaly. There is no tenderness. There is no rebound, no guarding, no tenderness at McBurney's point and negative Murphy's sign. No hernia.  Musculoskeletal: Normal range of motion.  Neurological: She is alert and oriented to person, place, and  time. She has normal strength. No cranial nerve deficit or sensory deficit. Coordination normal. GCS eye subscore is 4. GCS verbal subscore is 5. GCS motor subscore is 6.  Skin: Skin is warm, dry and intact. No rash noted. No cyanosis.  Psychiatric: She has a normal mood and affect. Her speech is normal and behavior is normal. Thought content normal.  Nursing note and vitals reviewed.    ED Treatments / Results  DIAGNOSTIC STUDIES:  Oxygen Saturation is 100% on RA, normal by my interpretation.    COORDINATION OF CARE:  12:07 AM Discussed treatment plan with pt at bedside and pt agreed to plan.   Labs (all labs ordered are listed, but only abnormal results are displayed) Labs Reviewed - No data to display  EKG  EKG Interpretation None       Radiology Dg Chest 2 View  Result Date: 11/30/2016 CLINICAL DATA:  Cough, congestion, and sore throat for 2 days. Recent diagnosis of diabetes. History of right lung cancer. Shortness of breath. Former smoker. EXAM: CHEST  2 VIEW COMPARISON:  CT chest 07/11/2016.  Chest 07/11/2016. FINDINGS: Shallow inspiration with mild linear atelectasis or fibrosis in the lung bases. Heart size and pulmonary vascularity are normal. No focal airspace disease or consolidation. Mediastinal contours appear intact. Degenerative changes in the spine and shoulders. Similar appearance to previous study. IMPRESSION: Shallow inspiration with linear atelectasis or fibrosis in the lung bases. No focal consolidation to suggest active disease. Electronically Signed   By: Lucienne Capers M.D.   On: 11/30/2016 01:08    Procedures Procedures (including critical care time)  Medications Ordered in ED Medications  ipratropium-albuterol (DUONEB) 0.5-2.5 (3) MG/3ML nebulizer solution 3 mL (3 mLs Nebulization Given 11/30/16 0027)  albuterol (PROVENTIL) (2.5 MG/3ML) 0.083% nebulizer solution 2.5 mg (2.5 mg Nebulization Given 11/30/16 0027)     Initial Impression / Assessment  and Plan /  ED Course  I have reviewed the triage vital signs and the nursing notes.  Pertinent labs & imaging results that were available during my care of the patient were reviewed by me and considered in my medical decision making (see chart for details).     Patient with history of environmental allergies and some form of reactive airway disease presents to the ER for evaluation of cough, sore throat and shortness of breath. Patient reports that she had a coughing episode prior to arrival during which time she was having trouble catching her breath. She coughed up a bunch of mucus and now is feeling better. Oxygen saturations are normal. Chest x-ray is clear. Patient did have very slight wheezing at arrival. She has improved wit treatment. She does have an albuterol inhaler for use at home. She takes year take dai. She will supplement with Benadryl as needed for her environmental allergies, follow-up with primary care.  Final Clinical Impressions(s) / ED Diagnoses   Final diagnoses:  Environmental allergies    New Prescriptions New Prescriptions   No medications on file  I personally performed the services described in this documentation, which was scribed in my presence. The recorded information has been reviewed and is accurate.     Orpah Greek, MD 11/30/16 [phone]

## 2016-11-30 ENCOUNTER — Encounter (HOSPITAL_COMMUNITY): Payer: Self-pay

## 2016-11-30 ENCOUNTER — Emergency Department (HOSPITAL_COMMUNITY): Payer: Medicare Other

## 2016-11-30 DIAGNOSIS — R05 Cough: Secondary | ICD-10-CM | POA: Diagnosis not present

## 2016-11-30 DIAGNOSIS — T7840XA Allergy, unspecified, initial encounter: Secondary | ICD-10-CM | POA: Diagnosis not present

## 2016-11-30 DIAGNOSIS — R0602 Shortness of breath: Secondary | ICD-10-CM | POA: Diagnosis not present

## 2016-11-30 MED ORDER — IPRATROPIUM-ALBUTEROL 0.5-2.5 (3) MG/3ML IN SOLN
3.0000 mL | Freq: Once | RESPIRATORY_TRACT | Status: AC
  Administered 2016-11-30: 3 mL via RESPIRATORY_TRACT
  Filled 2016-11-30: qty 3

## 2016-11-30 MED ORDER — DIPHENHYDRAMINE HCL 25 MG PO CAPS
25.0000 mg | ORAL_CAPSULE | Freq: Once | ORAL | Status: AC
  Administered 2016-11-30: 25 mg via ORAL
  Filled 2016-11-30: qty 1

## 2016-11-30 MED ORDER — ALBUTEROL SULFATE (2.5 MG/3ML) 0.083% IN NEBU
2.5000 mg | INHALATION_SOLUTION | Freq: Once | RESPIRATORY_TRACT | Status: AC
  Administered 2016-11-30: 2.5 mg via RESPIRATORY_TRACT
  Filled 2016-11-30: qty 3

## 2016-11-30 NOTE — ED Triage Notes (Signed)
Cough, congestion, sore throat x 2 days

## 2016-12-26 ENCOUNTER — Other Ambulatory Visit (INDEPENDENT_AMBULATORY_CARE_PROVIDER_SITE_OTHER): Payer: Self-pay

## 2016-12-26 ENCOUNTER — Other Ambulatory Visit: Payer: Self-pay

## 2016-12-26 DIAGNOSIS — G629 Polyneuropathy, unspecified: Secondary | ICD-10-CM

## 2016-12-26 MED ORDER — IBUPROFEN 800 MG PO TABS: 800.0000 mg | ORAL_TABLET | Freq: Three times a day (TID) | ORAL | 3 refills | Status: DC | PRN

## 2017-01-22 ENCOUNTER — Other Ambulatory Visit: Payer: Self-pay | Admitting: Family Medicine

## 2017-01-22 ENCOUNTER — Encounter: Payer: Self-pay | Admitting: Physician Assistant

## 2017-01-22 ENCOUNTER — Ambulatory Visit (INDEPENDENT_AMBULATORY_CARE_PROVIDER_SITE_OTHER): Payer: Medicare Other | Admitting: Physician Assistant

## 2017-01-22 VITALS — BP 120/90 | HR 81 | Temp 97.4°F | Resp 14 | Wt 156.2 lb

## 2017-01-22 DIAGNOSIS — M79671 Pain in right foot: Secondary | ICD-10-CM | POA: Diagnosis not present

## 2017-01-22 DIAGNOSIS — M79672 Pain in left foot: Secondary | ICD-10-CM | POA: Diagnosis not present

## 2017-01-22 DIAGNOSIS — F32A Depression, unspecified: Secondary | ICD-10-CM

## 2017-01-22 DIAGNOSIS — F329 Major depressive disorder, single episode, unspecified: Secondary | ICD-10-CM

## 2017-01-22 DIAGNOSIS — E119 Type 2 diabetes mellitus without complications: Secondary | ICD-10-CM | POA: Diagnosis not present

## 2017-01-22 DIAGNOSIS — E7800 Pure hypercholesterolemia, unspecified: Secondary | ICD-10-CM | POA: Diagnosis not present

## 2017-01-22 DIAGNOSIS — F419 Anxiety disorder, unspecified: Secondary | ICD-10-CM

## 2017-01-22 DIAGNOSIS — M722 Plantar fascial fibromatosis: Secondary | ICD-10-CM | POA: Diagnosis not present

## 2017-01-22 DIAGNOSIS — I10 Essential (primary) hypertension: Secondary | ICD-10-CM | POA: Diagnosis not present

## 2017-01-22 LAB — COMPLETE METABOLIC PANEL WITH GFR
ALT: 12 U/L (ref 6–29)
AST: 15 U/L (ref 10–35)
Albumin: 4.5 g/dL (ref 3.6–5.1)
Alkaline Phosphatase: 59 U/L (ref 33–130)
BUN: 18 mg/dL (ref 7–25)
CHLORIDE: 101 mmol/L (ref 98–110)
CO2: 31 mmol/L (ref 20–31)
Calcium: 9.9 mg/dL (ref 8.6–10.4)
Creat: 1.18 mg/dL — ABNORMAL HIGH (ref 0.50–0.99)
GFR, EST NON AFRICAN AMERICAN: 49 mL/min — AB (ref >=60)
GFR, Est African American: 56 mL/min — ABNORMAL LOW (ref >=60)
GLUCOSE: 102 mg/dL — AB (ref 70–99)
POTASSIUM: 3.5 mmol/L (ref 3.5–5.3)
SODIUM: 142 mmol/L (ref 135–146)
Total Bilirubin: 0.5 mg/dL (ref 0.2–1.2)
Total Protein: 7.2 g/dL (ref 6.1–8.1)

## 2017-01-22 LAB — LIPID PANEL
CHOL/HDL RATIO: 2.4 ratio (ref <5.0)
CHOLESTEROL: 153 mg/dL (ref <200)
HDL: 63 mg/dL (ref >50)
LDL Cholesterol: 60 mg/dL (ref <100)
Triglycerides: 151 mg/dL — ABNORMAL HIGH (ref <150)
VLDL: 30 mg/dL (ref <30)

## 2017-01-22 MED ORDER — LOSARTAN POTASSIUM 50 MG PO TABS: 50.0000 mg | ORAL_TABLET | Freq: Every day | ORAL | 3 refills | Status: DC

## 2017-01-22 MED ORDER — ASPIRIN 81 MG PO TABS: 81.0000 mg | ORAL_TABLET | Freq: Every day | ORAL | 11 refills | Status: AC

## 2017-01-22 MED ORDER — NAPROXEN 250 MG PO TABS: 250.0000 mg | ORAL_TABLET | Freq: Two times a day (BID) | ORAL | 3 refills | Status: DC | PRN

## 2017-01-22 NOTE — Progress Notes (Signed)
Patient ID: Deborah Mullins MRN: [id number], DOB: 1950-12-20, 66 y.o. Date of Encounter: 01/22/2017, 8:34 AM    Chief Complaint:  Chief Complaint  Patient presents with  . diabetes follow up     HPI: 66 y.o. year old female   11/23/2016: presents to [facility].   Says she saw me years ago.   Recently went to a another office because that is where her husband goes and thought it would be simpler to go to the same office that he goes to.  However has decided that she wants to return to coming to see me for her medical care.  Says that the new office that she recently went to gave her the new diagnosis of diabetes and started her on metformin.  Says "that's all she did ". Says that "they did not give me any kind of testing kit" etc.  So patient discussed that with her pharmacy. Patient says that she then called the doctor's office about getting this and says that it was multiple times back and forth just trying to get that one little thing.  Coming back to reestablish care.  She is taking metformin 500 mg twice a day. Says that she has decreased the sugar and sweets in her diet.  However says that it is hard to give up her bread-- that she "has to have toast or bagel in the morning".  Later in the conversation she is talking about wanting me to recheck her cholesterol and says that she had a piece of cake with her coffee this morning (!!)  She does bring in blood sugar log -- fasting morning readings--- 130, 113, 105, 107, 122, 120.  Says recently she got tired of writing them down so those are all just in her meter which she does not have with her today but says they are mostly in the 110s.  Says that she is very active and that she and her husband clean [place] at night.  Interested in rechecking her cholesterol but is not fasting today.  No other specific concerns to address today.    01/22/2017: Today she reports that she is taking the Flonase and Zyrtec and that is  working well for her allergies.  Today she reports that she is taking the gabapentin for her neuropathy but is wondering if the dose needs to be increased. Says that she is having a lot of discomfort in both of her heels. States that she and her husband clean [place] in the evening and when she is on her feet for 2 hours doing that she develops pain in her heels at that time. However she also says that even at rest she still feels some pain. She also states that when she first stands up after sleeping at night she has severe pain.  Asked her about the Prozac. Says that she has been on that for about 5 years. Says that it was started for both anxiety and depression. Says that at that time she was having problems with her husband. Says that the Prozac has worked well for her and feels that she does need to continue this but that it does control but the anxiety and depression well and is causing no adverse effects.  Also reviewed the fact that she had been on cholesterol medication in the past but is not currently taking any. She thinks that she stop the Crestor because of expense and she knows that she was prescribed Lipitor in the past but  can't remember what happened with that.  Also discussed adding aspirin 81 mg daily. She reports that she has no history of peptic ulcer disease or other bleeding disorder etc.  She states that she is on HCTZ just for high blood pressure but she has no history of lower extremity edema. Says that she actually would like to get off of the HCTZ because she sometimes has incontinence and feels that this HCTZ is contributing to that.  No other concerns to address today. She is taking the metformin as directed and is having no adverse effects with that.     Home Meds:   Outpatient Medications Prior to Visit  Medication Sig Dispense Refill  . albuterol (PROVENTIL HFA;VENTOLIN HFA) 108 (90 Base) MCG/ACT inhaler Inhale 2 puffs into the lungs every 4 (four) hours as needed  for wheezing or shortness of breath. 1 Inhaler 0  . blood glucose meter kit and supplies KIT Check up to twice a day before meals 1 each 0  . cetirizine (ZYRTEC) 10 MG tablet Take 1 tablet (10 mg total) by mouth daily. 30 tablet 11  . FLUoxetine (PROZAC) 40 MG capsule Take 1 capsule (40 mg total) by mouth daily. 90 capsule 2  . fluticasone (FLONASE) 50 MCG/ACT nasal spray Place 2 sprays into both nostrils daily. 16 g 6  . gabapentin (NEURONTIN) 800 MG tablet Take 1 tablet (800 mg total) by mouth 2 (two) times daily. 60 tablet 4  . glucose blood test strip Check up to twice a day 100 each 1  . hydrochlorothiazide (HYDRODIURIL) 25 MG tablet TAKE 1 TABLET BY MOUTH EVERY DAY 90 tablet 4  . ibuprofen (ADVIL,MOTRIN) 800 MG tablet Take 1 tablet (800 mg total) by mouth every 8 (eight) hours as needed. 90 tablet 3  . Lancets (ONETOUCH ULTRASOFT) lancets Use as instructed 100 each 1  . metFORMIN (GLUCOPHAGE) 500 MG tablet Take 1 tablet (500 mg total) by mouth 2 (two) times daily with a meal. 60 tablet 5  . naproxen (NAPROSYN) 250 MG tablet Take 1 tablet (250 mg total) by mouth 2 (two) times daily as needed for mild pain or moderate pain (take with food). 14 tablet 0  . pantoprazole (PROTONIX) 40 MG tablet TAKE 1 TABLET (40 MG TOTAL) BY MOUTH DAILY. 90 tablet 2  . polyethylene glycol powder (GLYCOLAX/MIRALAX) powder Take 17 g by mouth daily as needed (constipation). 850 g 0  . polyethylene glycol-electrolytes (TRILYTE) 420 g solution Take 4,000 mLs by mouth as directed. 4000 mL 0  . rosuvastatin (CRESTOR) 40 MG tablet TAKE 1 TABLET BY MOUTH EVERY DAY 90 tablet 0  . Capsaicin-Menthol-Methyl Sal (CAPSAICIN-METHYL SAL-MENTHOL) 0.025-1-12 % CREA Apply 1 application topically 2 (two) times daily as needed. 56.6 g 1   No facility-administered medications prior to visit.     Allergies:  Allergies  Allergen Reactions  . Bee Venom Anaphylaxis  . Penicillins Rash    Has patient had a PCN reaction causing  immediate rash, facial/tongue/throat swelling, SOB or lightheadedness with hypotension: Yes Has patient had a PCN reaction causing severe rash involving mucus membranes or skin necrosis: No Has patient had a PCN reaction that required hospitalization No Has patient had a PCN reaction occurring within the last 10 years: No If all of the above answers are "NO", then may proceed with Cephalosporin use.        Review of Systems: See HPI for pertinent ROS. All other ROS negative.    Physical Exam: Blood pressure 120/90, pulse 81,  temperature 97.4 F (36.3 C), temperature source Oral, resp. rate 14, weight 156 lb 3.2 oz (70.9 kg), SpO2 98 %., Body mass index is 27.67 kg/m. General: WNWD WF.  Appears in no acute distress. Neck: Supple. No thyromegaly. No lymphadenopathy. Lungs: Clear bilaterally to auscultation without wheezes, rales, or rhonchi. Breathing is unlabored. Heart: Regular rhythm. No murmurs, rubs, or gallops. Abdomen: Soft, non-tender, non-distended with normoactive bowel sounds. No hepatomegaly. No rebound/guarding. No obvious abdominal masses. Msk:  Strength and tone normal for age. There is no tenderness when I palpate the bottom of her heels bilaterally. There is tenderness when I palpate the medial aspect of her arch region on the right foot. There is no tenderness when I palpate this area on the left foot. Inspection of her feet is normal. Extremities/Skin: Warm and dry. No edema. Neuro: Alert and oriented X 3. Moves all extremities spontaneously. Gait is normal. CNII-XII grossly in tact. Psych:  Responds to questions appropriately with a normal affect.     ASSESSMENT AND PLAN:  66 y.o. year old female with   1. Controlled type 2 diabetes mellitus without complication, without long-term current use of insulin ([place])  01/22/2017:  She is currently on metformin 500 mg At this time will add baby aspirin daily. Also will add ARB (losartan '50mg'$  QD). Will discontinue the  HCTZ. Will check A1C and MicroAlbumin Will have her schedule f/u OV 2 weeks to recheck BP and BMET after changing HCTZ to Losartan  - aspirin 81 MG tablet; Take 1 tablet (81 mg total) by mouth daily.  Dispense: 30 tablet; Refill: 11 - losartan (COZAAR) 50 MG tablet; Take 1 tablet (50 mg total) by mouth daily.  Dispense: 90 tablet; Refill: 3 - COMPLETE METABOLIC PANEL WITH GFR - Microalbumin, urine - Hemoglobin A1c  2. Anxiety and depression 01/22/2017: This is stable and controlled on Prozac 40 mg daily. Continue this medication.  3. HYPERCHOLESTEROLEMIA 01/22/2017: She is currently on no statin. She is fasting so we'll check lab and follow-up with that. - COMPLETE METABOLIC PANEL WITH GFR - Lipid panel  4. Pain of both heels 01/22/2017: Will obtain x-ray to evaluate for possible heel spurs. Fact that she does have pain when on her feet and after being on her feet for 2 hours cleaning does suggest possible heel spurs. - DG Foot Complete Left; Future - DG Foot Complete Right; Future  5. Plantar fasciitis 01/22/2017: Fact that she has intense pain when she first stands on her feet after lying in bed is consistent with plantar fasciitis. Also the tenderness with palpation at the medial arch is also consistent with plantar fasciitis. At today's visit I gave and reviewed handout with treatments including wearing proper arch support etc. and also gave handout with stretches for her to do and she is to do these at least twice every day.  6. Essential hypertension 01/22/2017: Stopping HCTZ and start taking losartan 50 mg daily so she will get renal benefit given her diabetes. She is to have follow-up office visit in 2 weeks to recheck BP and BMET after med change. - losartan (COZAAR) 50 MG tablet; Take 1 tablet (50 mg total) by mouth daily.  Dispense: 90 tablet; Refill: 3 - COMPLETE METABOLIC PANEL WITH GFR   Follow-up office visit 2 weeks to recheck BP and BMET    Signed, [address] [place],  [place], [facility] 01/22/2017 8:34 AM

## 2017-01-23 ENCOUNTER — Other Ambulatory Visit: Payer: Self-pay

## 2017-01-23 DIAGNOSIS — E119 Type 2 diabetes mellitus without complications: Secondary | ICD-10-CM

## 2017-01-23 LAB — MICROALBUMIN, URINE: MICROALB UR: 24.5 mg/dL

## 2017-01-23 LAB — HEMOGLOBIN A1C
HEMOGLOBIN A1C: 5.7 % — AB (ref <5.7)
Mean Plasma Glucose: 117 mg/dL

## 2017-01-23 MED ORDER — GLUCOSE BLOOD VI STRP: ORAL_STRIP | 1 refills | Status: AC

## 2017-01-29 ENCOUNTER — Other Ambulatory Visit: Payer: Self-pay

## 2017-01-29 MED ORDER — ROSUVASTATIN CALCIUM 40 MG PO TABS: 40.0000 mg | ORAL_TABLET | Freq: Every day | ORAL | 0 refills | Status: DC

## 2017-02-05 ENCOUNTER — Ambulatory Visit: Payer: Medicare Other | Admitting: Physician Assistant

## 2017-02-05 ENCOUNTER — Encounter: Payer: Self-pay | Admitting: Family Medicine

## 2017-02-09 ENCOUNTER — Ambulatory Visit
Admission: RE | Admit: 2017-02-09 | Discharge: 2017-02-09 | Disposition: A | Discharge status: Home / Self Care | Payer: Medicare Other | Source: Ambulatory Visit | Attending: Physician Assistant | Admitting: Physician Assistant

## 2017-02-09 DIAGNOSIS — M79672 Pain in left foot: Principal | ICD-10-CM

## 2017-02-09 DIAGNOSIS — M79671 Pain in right foot: Secondary | ICD-10-CM | POA: Diagnosis not present

## 2017-02-12 ENCOUNTER — Telehealth: Payer: Self-pay

## 2017-02-12 DIAGNOSIS — M722 Plantar fascial fibromatosis: Secondary | ICD-10-CM

## 2017-02-12 NOTE — Telephone Encounter (Signed)
Has she followed the treatment, handouts that I gave/reviewed at office visit ? If not-- tell her to follow these closely. If she has followed these treatments but is still having that much pain-- then she needs to follow-up with podiatrist. Place order for referral to podiatry (if indicated above)

## 2017-02-12 NOTE — Telephone Encounter (Signed)
Patient states she has tried the recommendations that were given to her as well as brought new shoes they don't seem to help. Pt verbalizes understanding that we will refer her to a podiatrist.   Patient is not happy with that decision and states she can't understand why we will not give her a stronger pain med Ibuprofen and naproxen is not working for her.

## 2017-02-12 NOTE — Telephone Encounter (Signed)
Patient was seen in office on 01/22/2017 and DX with plantar fasciitis. Pt states her feet are hurting her and she is requesting a Rx for hydrocodone  Pls advise

## 2017-02-15 ENCOUNTER — Other Ambulatory Visit: Payer: Self-pay | Admitting: Pediatrics

## 2017-02-15 DIAGNOSIS — G629 Polyneuropathy, unspecified: Secondary | ICD-10-CM

## 2017-02-16 ENCOUNTER — Other Ambulatory Visit: Payer: Self-pay | Admitting: Physician Assistant

## 2017-02-16 DIAGNOSIS — G629 Polyneuropathy, unspecified: Secondary | ICD-10-CM

## 2017-03-12 ENCOUNTER — Other Ambulatory Visit: Payer: Self-pay | Admitting: Pediatrics

## 2017-03-12 DIAGNOSIS — E119 Type 2 diabetes mellitus without complications: Secondary | ICD-10-CM

## 2017-03-24 ENCOUNTER — Other Ambulatory Visit: Payer: Self-pay | Admitting: Pediatrics

## 2017-03-24 DIAGNOSIS — E119 Type 2 diabetes mellitus without complications: Secondary | ICD-10-CM

## 2017-03-27 ENCOUNTER — Other Ambulatory Visit: Payer: Self-pay | Admitting: Physician Assistant

## 2017-03-27 ENCOUNTER — Encounter (HOSPITAL_COMMUNITY): Payer: Self-pay | Admitting: *Deleted

## 2017-03-27 ENCOUNTER — Emergency Department (HOSPITAL_COMMUNITY)
Admission: EM | Admit: 2017-03-27 | Discharge: 2017-03-27 | Disposition: A | Discharge status: Home / Self Care | Payer: Medicare Other | Attending: Emergency Medicine | Admitting: Emergency Medicine

## 2017-03-27 ENCOUNTER — Emergency Department (HOSPITAL_COMMUNITY): Payer: Medicare Other

## 2017-03-27 DIAGNOSIS — Y999 Unspecified external cause status: Secondary | ICD-10-CM | POA: Insufficient documentation

## 2017-03-27 DIAGNOSIS — S40012A Contusion of left shoulder, initial encounter: Secondary | ICD-10-CM | POA: Insufficient documentation

## 2017-03-27 DIAGNOSIS — Z87891 Personal history of nicotine dependence: Secondary | ICD-10-CM | POA: Insufficient documentation

## 2017-03-27 DIAGNOSIS — E119 Type 2 diabetes mellitus without complications: Secondary | ICD-10-CM | POA: Insufficient documentation

## 2017-03-27 DIAGNOSIS — S4992XA Unspecified injury of left shoulder and upper arm, initial encounter: Secondary | ICD-10-CM | POA: Diagnosis not present

## 2017-03-27 DIAGNOSIS — W010XXA Fall on same level from slipping, tripping and stumbling without subsequent striking against object, initial encounter: Secondary | ICD-10-CM | POA: Diagnosis not present

## 2017-03-27 DIAGNOSIS — M25512 Pain in left shoulder: Secondary | ICD-10-CM | POA: Diagnosis not present

## 2017-03-27 DIAGNOSIS — Z7984 Long term (current) use of oral hypoglycemic drugs: Secondary | ICD-10-CM | POA: Diagnosis not present

## 2017-03-27 DIAGNOSIS — Z79899 Other long term (current) drug therapy: Secondary | ICD-10-CM | POA: Diagnosis not present

## 2017-03-27 DIAGNOSIS — J449 Chronic obstructive pulmonary disease, unspecified: Secondary | ICD-10-CM | POA: Insufficient documentation

## 2017-03-27 DIAGNOSIS — I10 Essential (primary) hypertension: Secondary | ICD-10-CM | POA: Insufficient documentation

## 2017-03-27 DIAGNOSIS — C3490 Malignant neoplasm of unspecified part of unspecified bronchus or lung: Secondary | ICD-10-CM | POA: Diagnosis not present

## 2017-03-27 DIAGNOSIS — Z853 Personal history of malignant neoplasm of breast: Secondary | ICD-10-CM | POA: Diagnosis not present

## 2017-03-27 DIAGNOSIS — Y929 Unspecified place or not applicable: Secondary | ICD-10-CM | POA: Diagnosis not present

## 2017-03-27 DIAGNOSIS — Y939 Activity, unspecified: Secondary | ICD-10-CM | POA: Insufficient documentation

## 2017-03-27 DIAGNOSIS — Z7982 Long term (current) use of aspirin: Secondary | ICD-10-CM | POA: Insufficient documentation

## 2017-03-27 DIAGNOSIS — S46912A Strain of unspecified muscle, fascia and tendon at shoulder and upper arm level, left arm, initial encounter: Secondary | ICD-10-CM | POA: Insufficient documentation

## 2017-03-27 DIAGNOSIS — M79622 Pain in left upper arm: Secondary | ICD-10-CM | POA: Diagnosis not present

## 2017-03-27 MED ORDER — IBUPROFEN 400 MG PO TABS
400.0000 mg | ORAL_TABLET | Freq: Once | ORAL | Status: AC
  Administered 2017-03-27: 400 mg via ORAL
  Filled 2017-03-27: qty 1

## 2017-03-27 MED ORDER — MELOXICAM 15 MG PO TABS: 15.0000 mg | ORAL_TABLET | Freq: Every day | ORAL | 0 refills | Status: DC

## 2017-03-27 MED ORDER — HYDROCODONE-ACETAMINOPHEN 5-325 MG PO TABS: 1.0000 | ORAL_TABLET | ORAL | 0 refills | Status: DC | PRN

## 2017-03-27 MED ORDER — HYDROCODONE-ACETAMINOPHEN 5-325 MG PO TABS
2.0000 | ORAL_TABLET | Freq: Once | ORAL | Status: AC
  Administered 2017-03-27: 2 via ORAL
  Filled 2017-03-27: qty 2

## 2017-03-27 MED ORDER — PROMETHAZINE HCL 12.5 MG PO TABS
12.5000 mg | ORAL_TABLET | Freq: Once | ORAL | Status: AC
  Administered 2017-03-27: 12.5 mg via ORAL
  Filled 2017-03-27: qty 1

## 2017-03-27 NOTE — ED Provider Notes (Signed)
[facility] DEPT Provider Note   CSN: [id number] Arrival date & time: 03/27/17  1535     History   Chief Complaint Chief Complaint  Patient presents with  . Shoulder Injury    HPI Deborah Mullins is a 66 y.o. female.  Patient is a 66 year old female who presents to the emergency department with a complaint of shoulder pain.  Patient states that approximately 1 or 2:00 today she lost her balance, lost her footing, and fell on her left shoulder. She states that she had pain immediately. She's not been able to raise her arm without severe pain. She denies any pain involving her elbow or wrist. She denies hitting her head. She denies any difficulty with breathing or speaking. Patient denies being on any anticoagulation medications, and she has no history of any bleeding disorder.      Past Medical History:  Diagnosis Date  . Allergic rhinitis   . Allergy   . Anxiety   . Arthritis   . Bipolar 1 disorder ([place])   . Cancer [facility]) 2004   breast   . Carpal tunnel syndrome of left wrist   . Chronic neck pain   . Chronic shoulder pain    L>R  . Depression   . Diabetes mellitus without complication ([place])   . GERD (gastroesophageal reflux disease)   . Headache(784.0)   . History of lung surgery   . Hyperlipidemia   . Hypertension   . Lung cancer ([place])   . Lung nodule 2009  . Pain in both feet   . Personal history of radiation therapy 2004  . PONV (postoperative nausea and vomiting)   . Prediabetes    a1c 6.4 12/16  . Shortness of breath    exertion    Patient Active Problem List   Diagnosis Date Noted  . Essential hypertension 01/22/2017  . Controlled type 2 diabetes mellitus without complication, without long-term current use of insulin ([place]) 11/23/2016  . Chronic pain of both shoulders 07/24/2016  . Prediabetes   . Anxiety   . Bipolar 1 disorder ([place])   . Depression   . Chronic back pain 11/07/2012  . Insomnia secondary to depression with anxiety 09/23/2012    . Unspecified vitamin D deficiency 09/23/2012  . COPD, mild ([place]) 07/01/2012  . Acute bronchitis 06/08/2012  . Adenocarcinoma of lung, stage 1 ([place]) 05/23/2012  . RECTAL BLEEDING 04/27/2010  . HEMORRHOIDS 03/11/2009  . ADENOCARCINOMA, BREAST 12/15/2008  . FIBROIDS, UTERUS 12/15/2008  . HYPERCHOLESTEROLEMIA 12/15/2008  . HYPERLIPIDEMIA 12/15/2008  . Anxiety and depression 12/15/2008  . GASTROESOPHAGEAL REFLUX DISEASE 12/15/2008  . GASTRITIS 12/15/2008  . CONSTIPATION 12/15/2008  . FLATULENCE ERUCTATION AND GAS PAIN 12/15/2008  . ABDOMINAL PAIN 12/15/2008  . RECTAL BLEEDING, HX OF 12/15/2008    Past Surgical History:  Procedure Laterality Date  . BREAST SURGERY  2005   lumpectomy - left  . carpel tunnel Right   . CESAREAN SECTION  1971  . COLONOSCOPY WITH PROPOFOL N/A 07/25/2016   Procedure: COLONOSCOPY WITH PROPOFOL;  Surgeon: Danie Binder, MD;  Location: [facility] SUITE;  Service: Endoscopy;  Laterality: N/A;  8:15 AM  . EYE SURGERY    . LUNG CANCER SURGERY Right 05/23/2012   reportedly clear  . TONSILLECTOMY    . TUBAL LIGATION      OB History    Gravida Para Term Preterm AB Living   '1 1 1     1   '$ SAB TAB Ectopic Multiple Live Births  Home Medications    Prior to Admission medications   Medication Sig Start Date End Date Taking? Authorizing Provider  albuterol (PROVENTIL HFA;VENTOLIN HFA) 108 (90 Base) MCG/ACT inhaler Inhale 2 puffs into the lungs every 4 (four) hours as needed for wheezing or shortness of breath. 07/11/16   Francine Graven, DO  aspirin 81 MG tablet Take 1 tablet (81 mg total) by mouth daily. 01/22/17   Dena Billet B, PA-C  blood glucose meter kit and supplies KIT Check up to twice a day before meals 11/02/16   Eustaquio Maize, MD  cetirizine (ZYRTEC) 10 MG tablet Take 1 tablet (10 mg total) by mouth daily. 10/11/16   Eustaquio Maize, MD  FLUoxetine (PROZAC) 40 MG capsule Take 1 capsule (40 mg total) by mouth daily. 10/11/16  10/11/17  Eustaquio Maize, MD  fluticasone (FLONASE) 50 MCG/ACT nasal spray Place 2 sprays into both nostrils daily. 10/11/16   Eustaquio Maize, MD  gabapentin (NEURONTIN) 800 MG tablet TAKE 1 TABLET (800 MG TOTAL) BY MOUTH 2 (TWO) TIMES DAILY. 02/16/17   Susy Frizzle, MD  glucose blood test strip Check up to twice a day 01/23/17   Susy Frizzle, MD  hydrochlorothiazide (HYDRODIURIL) 25 MG tablet TAKE 1 TABLET BY MOUTH EVERY DAY 01/22/17   Orlena Sheldon, PA-C  ibuprofen (ADVIL,MOTRIN) 800 MG tablet Take 1 tablet (800 mg total) by mouth every 8 (eight) hours as needed. 12/26/16   Mcarthur Rossetti, MD  Lancets [facility] ULTRASOFT) lancets Use as instructed 10/31/16   Eustaquio Maize, MD  losartan (COZAAR) 50 MG tablet Take 1 tablet (50 mg total) by mouth daily. 01/22/17   Orlena Sheldon, PA-C  metFORMIN (GLUCOPHAGE) 500 MG tablet TAKE 1 TABLET (500 MG TOTAL) BY MOUTH 2 (TWO) TIMES DAILY WITH A MEAL. 03/27/17   Orlena Sheldon, PA-C  naproxen (NAPROSYN) 250 MG tablet Take 1 tablet (250 mg total) by mouth 2 (two) times daily as needed for mild pain or moderate pain (take with food). 01/22/17   Dena Billet B, PA-C  pantoprazole (PROTONIX) 40 MG tablet TAKE 1 TABLET (40 MG TOTAL) BY MOUTH DAILY. 07/24/16   Susy Frizzle, MD  polyethylene glycol powder (GLYCOLAX/MIRALAX) powder Take 17 g by mouth daily as needed (constipation). 02/05/13   Darrol Jump, MD  polyethylene glycol-electrolytes (TRILYTE) 420 g solution Take 4,000 mLs by mouth as directed. 07/20/16   Fields, Marga Melnick, MD  rosuvastatin (CRESTOR) 40 MG tablet Take 1 tablet (40 mg total) by mouth daily. 01/29/17   Orlena Sheldon, PA-C    Family History Family History  Problem Relation Age of Onset  . Heart disease Mother        CHF  . Physical abuse Mother   . Depression Mother   . Cancer Sister   . ADD / ADHD Sister   . Alcohol abuse Father   . Alcohol abuse Brother   . Mental illness Brother   . Cancer Sister   . Heart disease  Sister   . Physical abuse Sister   . OCD Sister   . Mental illness Sister   . Anxiety disorder Neg Hx   . Bipolar disorder Neg Hx   . Dementia Neg Hx   . Drug abuse Neg Hx   . Paranoid behavior Neg Hx   . Schizophrenia Neg Hx   . Seizures Neg Hx   . Sexual abuse Neg Hx     Social History Social History  Substance  Use Topics  . Smoking status: Former Smoker    Packs/day: 1.00    Years: 10.00    Types: Cigarettes, E-cigarettes    Quit date: 08/14/1974  . Smokeless tobacco: Never Used  . Alcohol use Yes     Comment: occasionally      Allergies   Bee venom and Penicillins   Review of Systems Review of Systems  Constitutional: Negative for activity change.       All ROS Neg except as noted in HPI  HENT: Negative for nosebleeds.   Eyes: Negative for photophobia and discharge.  Respiratory: Negative for cough, shortness of breath and wheezing.   Cardiovascular: Negative for chest pain and palpitations.  Gastrointestinal: Negative for abdominal pain and blood in stool.  Genitourinary: Negative for dysuria, frequency and hematuria.  Musculoskeletal: Positive for arthralgias. Negative for back pain and neck pain.  Skin: Negative.   Neurological: Negative for dizziness, seizures and speech difficulty.  Psychiatric/Behavioral: Negative for confusion and hallucinations.     Physical Exam Updated Vital Signs BP (!) 148/89 (BP Location: Right Arm)   Pulse 85   Temp (!) 97.4 F (36.3 C) (Oral)   Resp 15   Ht '5\' 3"'$  (1.6 m)   Wt 71.7 kg (158 lb)   SpO2 97%   BMI 27.99 kg/m   Physical Exam  Constitutional: She is oriented to person, place, and time. She appears well-developed and well-nourished.  Non-toxic appearance.  HENT:  Head: Normocephalic.  Right Ear: Tympanic membrane and external ear normal.  Left Ear: Tympanic membrane and external ear normal.  Eyes: Pupils are equal, round, and reactive to light. EOM and lids are normal.  Neck: Normal range of motion. Neck  supple. Carotid bruit is not present.  Cardiovascular: Normal rate, regular rhythm, normal heart sounds, intact distal pulses and normal pulses.   Pulmonary/Chest: Breath sounds normal. No respiratory distress.  Abdominal: Soft. Bowel sounds are normal. There is no tenderness. There is no guarding.  Musculoskeletal: She exhibits tenderness.       Left shoulder: She exhibits decreased range of motion and pain. She exhibits normal pulse.       Arms: Lymphadenopathy:       Head (right side): No submandibular adenopathy present.       Head (left side): No submandibular adenopathy present.    She has no cervical adenopathy.  Neurological: She is alert and oriented to person, place, and time. She has normal strength. No cranial nerve deficit or sensory deficit.  Skin: Skin is warm and dry.  Psychiatric: She has a normal mood and affect. Her speech is normal.  Nursing note and vitals reviewed.    ED Treatments / Results  Labs (all labs ordered are listed, but only abnormal results are displayed) Labs Reviewed - No data to display  EKG  EKG Interpretation None       Radiology Dg Shoulder Left  Result Date: 03/27/2017 CLINICAL DATA:  Pain following fall EXAM: LEFT SHOULDER - 2+ VIEW COMPARISON:  May 13, 2016 FINDINGS: Oblique and Y scapular images were obtained. There is no evident acute fracture or dislocation. There is extensive bony overgrowth along the proximal left humerus with generalized glenohumeral and acromioclavicular joint osteoarthritic change. No erosive change. Visualized left lung is clear. There is aortic atherosclerosis. IMPRESSION: Extensive bony overgrowth along the proximal humerus with generalized glenohumeral acromioclavicular osteoarthritis. No acute fracture or dislocation evident. There is aortic atherosclerosis. Aortic Atherosclerosis (ICD10-I70.0). Electronically Signed   By: Lowella Grip III M.D.  On: 03/27/2017 16:13   Dg Humerus Left  Result  Date: 03/27/2017 CLINICAL DATA:  Pain following fall EXAM: LEFT HUMERUS - 2+ VIEW COMPARISON:  None. FINDINGS: Frontal and lateral views were obtained. No fracture or dislocation. There is bony overgrowth along the medial proximal humerus with degenerative change in the glenohumeral and acromioclavicular joints. No erosive change. Visualized left lung is clear. There is aortic atherosclerosis. IMPRESSION: Osteoarthritic change in the left shoulder. No fracture or dislocation. Aortic atherosclerosis. Aortic Atherosclerosis (ICD10-I70.0). Electronically Signed   By: Lowella Grip III M.D.   On: 03/27/2017 16:12    Procedures Procedures (including critical care time) Sling. Patient sustained a fall. She injured her left shoulder. X-ray is negative for fracture or dislocation, however the patient has extreme pain with movement and with palpation. Patient fitted with a shoulder immobilizer. Ice pack provided. After application of the immobilizer, the radial pulses 2+. The capillary refill is less than 2 seconds. There is full range of motion of the fingers. Pain is unchanged at this point. Patient tolerated the procedure without problem. Medications Ordered in ED Medications  ibuprofen (ADVIL,MOTRIN) tablet 400 mg (not administered)  HYDROcodone-acetaminophen (NORCO/VICODIN) 5-325 MG per tablet 2 tablet (not administered)  promethazine (PHENERGAN) tablet 12.5 mg (not administered)     Initial Impression / Assessment and Plan / ED Course  I have reviewed the triage vital signs and the nursing notes.  Pertinent labs & imaging results that were available during my care of the patient were reviewed by me and considered in my medical decision making (see chart for details).    Pt seen by Dr Lindajo Royal.  Final Clinical Impressions(s) / ED Diagnoses MDM Patient sustained a fall between 1 and 2:00 PM today. She has pain with movement and palpation of the left shoulder. X-ray is negative for fracture or  dislocation. There is noted osteoarthritis present on. I suspect the patient has a contusion, and sprain of the shoulder. Patient fitted with a shoulder immobilizer. She will follow-up with Dr. Ninfa Linden in [place] as he has followed her shoulders in the past. Prescription for Mobic and Norco given to the patient.    Final diagnoses:  Contusion of left shoulder, initial encounter  Shoulder strain, left, initial encounter    New Prescriptions New Prescriptions   HYDROCODONE-ACETAMINOPHEN (NORCO/VICODIN) 5-325 MG TABLET    Take 1 tablet by mouth every 4 (four) hours as needed.   MELOXICAM (MOBIC) 15 MG TABLET    Take 1 tablet (15 mg total) by mouth daily.     Lily Kocher, PA-C 03/27/17 [place], [place], PA-C 03/27/17 1656    Nat Christen, MD 03/28/17 [phone]

## 2017-03-27 NOTE — ED Triage Notes (Deleted)
Pt c/o hematemesis that occurred today after leaving dialysis. Pt was unable to get dialysis done today due to low BP. Pt also c/o bilateral leg pain that started a few days ago.

## 2017-03-27 NOTE — ED Triage Notes (Signed)
Pt c/o left shoulder pain that started today after she fell landing on her left shoulder. Pt denies hitting her head.

## 2017-03-27 NOTE — Discharge Instructions (Signed)
Your x-ray is negative for fracture or dislocation. Please use your shoulder immobilizer until seen by Dr. Ninfa Linden. Use meloxicam daily with food. Use Norco for more severe pain. This medication may cause drowsiness. Please do not drink, drive, or participate in activity that requires concentration while taking this medication.

## 2017-03-30 ENCOUNTER — Ambulatory Visit: Payer: Medicare Other

## 2017-03-30 ENCOUNTER — Encounter: Payer: Self-pay | Admitting: Podiatry

## 2017-03-30 ENCOUNTER — Ambulatory Visit (INDEPENDENT_AMBULATORY_CARE_PROVIDER_SITE_OTHER): Payer: Medicare Other | Admitting: Podiatry

## 2017-03-30 VITALS — BP 154/102 | HR 79

## 2017-03-30 DIAGNOSIS — M722 Plantar fascial fibromatosis: Secondary | ICD-10-CM

## 2017-03-30 DIAGNOSIS — M79671 Pain in right foot: Secondary | ICD-10-CM

## 2017-03-30 DIAGNOSIS — M79672 Pain in left foot: Principal | ICD-10-CM

## 2017-03-30 MED ORDER — TRIAMCINOLONE ACETONIDE 10 MG/ML IJ SUSP
10.0000 mg | Freq: Once | INTRAMUSCULAR | Status: AC
  Administered 2017-03-30: 10 mg

## 2017-03-30 NOTE — Progress Notes (Signed)
   Subjective:    Patient ID: Deborah Mullins, female    DOB: April 16, 1951, 66 y.o.   MRN: [id number]  HPI  Chief Complaint  Patient presents with  . Peripheral Neuropathy    Both feet onset 6 months       Review of Systems  Constitutional: Positive for chills, diaphoresis and fatigue.  HENT: Positive for congestion and ear pain.   Eyes: Positive for itching.  Respiratory: Positive for shortness of breath.   Gastrointestinal: Positive for abdominal distention and constipation.  Endocrine: Positive for polydipsia and polyuria.  Genitourinary: Positive for urgency.  Musculoskeletal: Positive for arthralgias, back pain and myalgias.  Skin:       Open sores  Neurological: Positive for dizziness, numbness and headaches.  Hematological: Bruises/bleeds easily.  All other systems reviewed and are negative.      Objective:   Physical Exam        Assessment & Plan:

## 2017-03-30 NOTE — Progress Notes (Signed)
Subjective:    Patient ID: Deborah Mullins, female   DOB: 66 y.o.   MRN: [id number]   HPI patient presents stating she's having mostly pain in the right foot with pain in the heel and has generalized foot pain bilateral with history of neuropathic diagnosis. States the heels been hurting for about 6 months right over left    Review of Systems  All other systems reviewed and are negative.       Objective:  Physical Exam  Constitutional: She appears well-developed and well-nourished.  Cardiovascular: Intact distal pulses.   Musculoskeletal: Normal range of motion.  Neurological: She is alert.  Skin: Skin is warm.  Nursing note and vitals reviewed.  neurovascular status found to be intact with muscle strength adequate range of motion within normal limits with patient found to have exquisite discomfort in the plantar aspect of the right heel at the insertional point tendon calcaneus with pain worse when getting up in the morning after sitting and generalized foot pain with possible neuropathic call    Assessment:   Acute plantar fasciitis right with inflammation present and generalized pain which may have neuropathic      Plan:    H&P conditions reviewed and today we'll focus on the right heel and the heel was injected 3 mg Kenalog 5 mill grams Xylocaine a night splint was dispensed with all instructions on usage along with heat ice therapy and patient will begin neuropathy cream to try to help with the neuropathic symptoms that are present.  X-rays taken indicated spur but no indication stress fracture or advanced arthritis

## 2017-04-11 ENCOUNTER — Other Ambulatory Visit: Payer: Self-pay | Admitting: Pediatrics

## 2017-04-11 DIAGNOSIS — E119 Type 2 diabetes mellitus without complications: Secondary | ICD-10-CM

## 2017-04-18 ENCOUNTER — Encounter: Payer: Self-pay | Admitting: Physician Assistant

## 2017-04-18 ENCOUNTER — Ambulatory Visit (INDEPENDENT_AMBULATORY_CARE_PROVIDER_SITE_OTHER): Payer: Medicare Other | Admitting: Physician Assistant

## 2017-04-18 VITALS — BP 132/74 | HR 80 | Temp 97.5°F | Resp 16 | Ht 63.0 in | Wt 159.0 lb

## 2017-04-18 DIAGNOSIS — M25512 Pain in left shoulder: Secondary | ICD-10-CM

## 2017-04-18 DIAGNOSIS — E7800 Pure hypercholesterolemia, unspecified: Secondary | ICD-10-CM

## 2017-04-18 DIAGNOSIS — E119 Type 2 diabetes mellitus without complications: Secondary | ICD-10-CM

## 2017-04-18 DIAGNOSIS — F419 Anxiety disorder, unspecified: Secondary | ICD-10-CM

## 2017-04-18 DIAGNOSIS — F329 Major depressive disorder, single episode, unspecified: Secondary | ICD-10-CM | POA: Diagnosis not present

## 2017-04-18 DIAGNOSIS — M545 Low back pain: Secondary | ICD-10-CM

## 2017-04-18 DIAGNOSIS — I10 Essential (primary) hypertension: Secondary | ICD-10-CM

## 2017-04-18 DIAGNOSIS — M25511 Pain in right shoulder: Secondary | ICD-10-CM | POA: Diagnosis not present

## 2017-04-18 DIAGNOSIS — G8929 Other chronic pain: Secondary | ICD-10-CM | POA: Diagnosis not present

## 2017-04-18 LAB — BASIC METABOLIC PANEL WITH GFR
BUN/Creatinine Ratio: 20 (calc) (ref 6–22)
BUN: 25 mg/dL (ref 7–25)
CALCIUM: 9.9 mg/dL (ref 8.6–10.4)
CHLORIDE: 103 mmol/L (ref 98–110)
CO2: 28 mmol/L (ref 20–32)
Creat: 1.23 mg/dL — ABNORMAL HIGH (ref 0.50–0.99)
GFR, Est African American: 53 mL/min/{1.73_m2} — ABNORMAL LOW (ref >=60)
GFR, Est Non African American: 46 mL/min/{1.73_m2} — ABNORMAL LOW (ref >=60)
Glucose, Bld: 88 mg/dL (ref 65–99)
POTASSIUM: 4.3 mmol/L (ref 3.5–5.3)
Sodium: 140 mmol/L (ref 135–146)

## 2017-04-18 MED ORDER — FLUOXETINE HCL 40 MG PO CAPS: 40.0000 mg | ORAL_CAPSULE | Freq: Every day | ORAL | 2 refills | Status: DC

## 2017-04-18 MED ORDER — HYDROCODONE-ACETAMINOPHEN 5-325 MG PO TABS: 1.0000 | ORAL_TABLET | Freq: Every evening | ORAL | 0 refills | Status: DC | PRN

## 2017-04-18 MED ORDER — PANTOPRAZOLE SODIUM 40 MG PO TBEC: DELAYED_RELEASE_TABLET | ORAL | 2 refills | Status: DC

## 2017-04-18 MED ORDER — BLOOD GLUCOSE MONITOR KIT: PACK | 6 refills | Status: AC

## 2017-04-18 NOTE — Progress Notes (Signed)
Patient ID: Deborah Mullins MRN: [id number], DOB: 01/31/1951, 66 y.o. Date of Encounter: 04/18/2017, 12:16 PM    Chief Complaint:  Chief Complaint  Patient presents with  . Medication Refill    Needs protonix, prozac, test strips and lancets refilled  . Medication Management     HPI: 66 y.o. year old female    11/23/2016: presents to [facility].   Says she saw me years ago.   Recently went to a another office because that is where her husband goes and thought it would be simpler to go to the same office that he goes to.  However has decided that she wants to return to coming to see me for her medical care.  Says that the new office that she recently went to gave her the new diagnosis of diabetes and started her on metformin.  Says "that's all she did ". Says that "they did not give me any kind of testing kit" etc.  So patient discussed that with her pharmacy. Patient says that she then called the doctor's office about getting this and says that it was multiple times back and forth just trying to get that one little thing.  Coming back to reestablish care.  She is taking metformin 500 mg twice a day. Says that she has decreased the sugar and sweets in her diet.  However says that it is hard to give up her bread-- that she "has to have toast or bagel in the morning".  Later in the conversation she is talking about wanting me to recheck her cholesterol and says that she had a piece of cake with her coffee this morning (!!)  She does bring in blood sugar log -- fasting morning readings--- 130, 113, 105, 107, 122, 120.  Says recently she got tired of writing them down so those are all just in her meter which she does not have with her today but says they are mostly in the 110s.  Says that she is very active and that she and her husband clean [place] at night.  Interested in rechecking her cholesterol but is not fasting today.  No other specific concerns to address  today.    01/22/2017: Today she reports that she is taking the Flonase and Zyrtec and that is working well for her allergies.  Today she reports that she is taking the gabapentin for her neuropathy but is wondering if the dose needs to be increased. Says that she is having a lot of discomfort in both of her heels. States that she and her husband clean [place] in the evening and when she is on her feet for 2 hours doing that she develops pain in her heels at that time. However she also says that even at rest she still feels some pain. She also states that when she first stands up after sleeping at night she has severe pain.  Asked her about the Prozac. Says that she has been on that for about 5 years. Says that it was started for both anxiety and depression. Says that at that time she was having problems with her husband. Says that the Prozac has worked well for her and feels that she does need to continue this but that it does control but the anxiety and depression well and is causing no adverse effects.  Also reviewed the fact that she had been on cholesterol medication in the past but is not currently taking any. She thinks that she stopped the Crestor  because of expense and she knows that she was prescribed Lipitor in the past but can't remember what happened with that.  Also discussed adding aspirin 81 mg daily. She reports that she has no history of peptic ulcer disease or other bleeding disorder etc.  She states that she is on HCTZ just for high blood pressure but she has no history of lower extremity edema. Says that she actually would like to get off of the HCTZ because she sometimes has incontinence and feels that this HCTZ is contributing to that.  No other concerns to address today. She is taking the metformin as directed and is having no adverse effects with that.    04/18/2017: At last office visit we checked fasting labs. Also at last office visit discontinued HCTZ and changed to  Losartan 19m (ARB--given Diabetes).  Also added aspirin 81 mg daily. Today patient states that she did stop HCTZ and has started losartan 50 mg and aspirin 81 mg. Today she reports that she continues to check her blood sugar. Says that most of the time her fasting readings are down near 80. Says that it may go up to 130 "if she cheated and ate a piece of cake before bed the night before ". Today she reports that she has chronic shoulder pain. States that she has had injections to her shoulder in the past. Says that after she cleans [place] for 2 hours in the evening she has significant pain in her shoulders. Difficult for her to get good sleep secondary to this pain. Prior PCP was giving her hydrocodone to use at night only-- and is requesting refill on this. States that she can go without the hydrocodone during the day but really needs it at night after she has been doing that cleaning that has aggravated her shoulders and also in order for her to sleep. States that 800 mg of ibuprofen does not relieve her pain. She has no other specific concerns to address today.     Home Meds:   Outpatient Medications Prior to Visit  Medication Sig Dispense Refill  . albuterol (PROVENTIL HFA;VENTOLIN HFA) 108 (90 Base) MCG/ACT inhaler Inhale 2 puffs into the lungs every 4 (four) hours as needed for wheezing or shortness of breath. 1 Inhaler 0  . aspirin 81 MG tablet Take 1 tablet (81 mg total) by mouth daily. 30 tablet 11  . blood glucose meter kit and supplies KIT Check up to twice a day before meals 1 each 0  . cetirizine (ZYRTEC) 10 MG tablet Take 1 tablet (10 mg total) by mouth daily. 30 tablet 11  . FLUoxetine (PROZAC) 40 MG capsule Take 1 capsule (40 mg total) by mouth daily. 90 capsule 2  . fluticasone (FLONASE) 50 MCG/ACT nasal spray Place 2 sprays into both nostrils daily. 16 g 6  . gabapentin (NEURONTIN) 800 MG tablet TAKE 1 TABLET (800 MG TOTAL) BY MOUTH 2 (TWO) TIMES DAILY. 60 tablet 3  . glucose  blood test strip Check up to twice a day 100 each 1  . HYDROcodone-acetaminophen (NORCO/VICODIN) 5-325 MG tablet Take 1 tablet by mouth every 4 (four) hours as needed. 12 tablet 0  . ibuprofen (ADVIL,MOTRIN) 800 MG tablet Take 1 tablet (800 mg total) by mouth every 8 (eight) hours as needed. 90 tablet 3  . Lancets (ONETOUCH ULTRASOFT) lancets Use as instructed 100 each 1  . losartan (COZAAR) 50 MG tablet Take 1 tablet (50 mg total) by mouth daily. 90 tablet 3  . meloxicam (  MOBIC) 15 MG tablet Take 1 tablet (15 mg total) by mouth daily. 6 tablet 0  . metFORMIN (GLUCOPHAGE) 500 MG tablet TAKE 1 TABLET (500 MG TOTAL) BY MOUTH 2 (TWO) TIMES DAILY WITH A MEAL. 60 tablet 2  . NON FORMULARY [facility]  Peripheral Neuropathy Cream- Bupivacaine 1%, Doxepin 3%, Gabapentin 6%, Pentoxifylline 3%, Topiramate 1% Apply 1-2 grams to affected area 3-4 times daily Qty. 120 gm 3 refills    . pantoprazole (PROTONIX) 40 MG tablet TAKE 1 TABLET (40 MG TOTAL) BY MOUTH DAILY. 90 tablet 2  . rosuvastatin (CRESTOR) 40 MG tablet Take 1 tablet (40 mg total) by mouth daily. 90 tablet 0  . hydrochlorothiazide (HYDRODIURIL) 25 MG tablet TAKE 1 TABLET BY MOUTH EVERY DAY (Patient not taking: Reported on 04/18/2017) 90 tablet 4  . naproxen (NAPROSYN) 250 MG tablet Take 1 tablet (250 mg total) by mouth 2 (two) times daily as needed for mild pain or moderate pain (take with food). (Patient not taking: Reported on 04/18/2017) 60 tablet 3  . polyethylene glycol powder (GLYCOLAX/MIRALAX) powder Take 17 g by mouth daily as needed (constipation). 850 g 0  . polyethylene glycol-electrolytes (TRILYTE) 420 g solution Take 4,000 mLs by mouth as directed. 4000 mL 0   No facility-administered medications prior to visit.     Allergies:  Allergies  Allergen Reactions  . Bee Venom Anaphylaxis  . Penicillins Rash    Has patient had a PCN reaction causing immediate rash, facial/tongue/throat swelling, SOB or lightheadedness with  hypotension: Yes Has patient had a PCN reaction causing severe rash involving mucus membranes or skin necrosis: No Has patient had a PCN reaction that required hospitalization No Has patient had a PCN reaction occurring within the last 10 years: No If all of the above answers are "NO", then may proceed with Cephalosporin use.        Review of Systems: See HPI for pertinent ROS. All other ROS negative.    Physical Exam: Blood pressure 132/74, pulse 80, temperature (!) 97.5 F (36.4 C), temperature source Oral, resp. rate 16, height '5\' 3"'$  (1.6 m), weight 159 lb (72.1 kg), SpO2 98 %., Body mass index is 28.17 kg/m. General: WNWD WF.  Appears in no acute distress. Neck: Supple. No thyromegaly. No lymphadenopathy. Lungs: Clear bilaterally to auscultation without wheezes, rales, or rhonchi. Breathing is unlabored. Heart: Regular rhythm. No murmurs, rubs, or gallops. Abdomen: Soft, non-tender, non-distended with normoactive bowel sounds. No hepatomegaly. No rebound/guarding. No obvious abdominal masses. Msk:  Strength and tone normal for age. There is no tenderness when I palpate the bottom of her heels bilaterally. There is tenderness when I palpate the medial aspect of her arch region on the right foot. There is no tenderness when I palpate this area on the left foot. Inspection of her feet is normal. Extremities/Skin: Warm and dry. No edema. Neuro: Alert and oriented X 3. Moves all extremities spontaneously. Gait is normal. CNII-XII grossly in tact. Psych:  Responds to questions appropriately with a normal affect.     ASSESSMENT AND PLAN:  66 y.o. year old female with   Controlled type 2 diabetes mellitus without complication, without long-term current use of insulin ([place]) 04/18/2017: She is now on metformin. She is on ARB-- losartan 50 mg daily. She is on aspirin 81 mg daily. A1c was excellent when checked 01/22/17. It has not been a full 3 months so will not recheck this now. Can wait  another 3 months prior to checking this at his  last A1c so good. She had microalbumin 01/22/17. Wait 1 year to repeat.  Essential hypertension 01/22/2017: Stopping HCTZ and start taking losartan 50 mg daily so she will get renal benefit given her diabetes. She is to have follow-up office visit in 2 weeks to recheck BP and BMET after med change. - losartan (COZAAR) 50 MG tablet; Take 1 tablet (50 mg total) by mouth daily.  Dispense: 90 tablet; Refill: 3 - COMPLETE METABOLIC PANEL WITH GFR 09/18/6718: BP is now at goal. She is on ARB for renal protection given her diabetes. Check BMET to monitor after med change.  Hyperlipidemia 04/18/2017: On Crestor 42m.  04/18/2017: She had FLP/LFT 01/22/17. LDL was 60. Continue Crestor 40.  Chronic pain of both shoulders 04/18/2017:  Even with 800 mg of ibuprofen she does not get relief. Cannot use tramadol with SSRI. Therefore will prescribe hydrocodone----limit to once daily dosing at nighttime. Drug Registry Printed, Reviewed. - HYDROcodone-acetaminophen (NORCO/VICODIN) 5-325 MG tablet; Take 1 tablet by mouth at bedtime as needed.  Dispense: 30 tablet; Refill: 0  Chronic bilateral low back pain without sciatica 04/18/2017:  Even with 800 mg of ibuprofen she does not get relief. Cannot use tramadol with SSRI. Therefore will prescribe hydrocodone----limit to once daily dosing at nighttime. Drug Registry Printed, Reviewed. - HYDROcodone-acetaminophen (NORCO/VICODIN) 5-325 MG tablet; Take 1 tablet by mouth at bedtime as needed.  Dispense: 30 tablet; Refill: 0  Anxiety and depression 04/18/2017: This is stable and controlled on Prozac 40 mg daily. Continue this medication.  Pain of both heels 01/22/2017: Will obtain x-ray to evaluate for possible heel spurs. Fact that she does have pain when on her feet and after being on her feet for 2 hours cleaning does suggest possible heel spurs. - DG Foot Complete Left; Future - DG Foot Complete Right; Future 04/18/2017: Now  being managed by [facility].  Plantar fasciitis 01/22/2017: Fact that she has intense pain when she first stands on her feet after lying in bed is consistent with plantar fasciitis. Also the tenderness with palpation at the medial arch is also consistent with plantar fasciitis. At today's visit I gave and reviewed handout with treatments including wearing proper arch support etc. and also gave handout with stretches for her to do and she is to do these at least twice every day. 04/18/2017: Now being managed by [facility].   Routine office visit 3 months, sooner if needed.    [id number] LaneDUniontown PUtah BAustin [facility] S[facility] D[facility] Ii9/12/2016 12:16 PM

## 2017-04-27 ENCOUNTER — Ambulatory Visit: Payer: Medicare Other | Admitting: Podiatry

## 2017-06-11 ENCOUNTER — Other Ambulatory Visit: Payer: Self-pay

## 2017-06-11 DIAGNOSIS — M25511 Pain in right shoulder: Principal | ICD-10-CM

## 2017-06-11 DIAGNOSIS — G8929 Other chronic pain: Secondary | ICD-10-CM

## 2017-06-11 DIAGNOSIS — M545 Low back pain: Secondary | ICD-10-CM

## 2017-06-11 DIAGNOSIS — M25512 Pain in left shoulder: Principal | ICD-10-CM

## 2017-06-11 MED ORDER — HYDROCODONE-ACETAMINOPHEN 5-325 MG PO TABS: 1.0000 | ORAL_TABLET | Freq: Every evening | ORAL | 0 refills | Status: DC | PRN

## 2017-06-11 NOTE — Telephone Encounter (Signed)
Last OV 04/18/2017 Last refill 04/18/2017 Ok to refill? DR printed placed on desk

## 2017-06-11 NOTE — Telephone Encounter (Signed)
rx can be picked up 10/30 pt aware

## 2017-06-11 NOTE — Telephone Encounter (Signed)
Drug registry printed and reviewed. Last Rx for hydrocodone was by me--for #30 on 04/18/17.  She has filled no controlled substances since then. Refill appropriate.  May print prescription for #30+0.

## 2017-06-15 ENCOUNTER — Telehealth (INDEPENDENT_AMBULATORY_CARE_PROVIDER_SITE_OTHER): Payer: Self-pay | Admitting: Radiology

## 2017-06-15 NOTE — Telephone Encounter (Signed)
I had a voice mail from them asking if the order for the back brace has been signed yet, and to please fax it back if so.

## 2017-06-18 ENCOUNTER — Other Ambulatory Visit: Payer: Self-pay | Admitting: Pediatrics

## 2017-06-18 ENCOUNTER — Telehealth (INDEPENDENT_AMBULATORY_CARE_PROVIDER_SITE_OTHER): Payer: Self-pay | Admitting: Orthopaedic Surgery

## 2017-06-18 DIAGNOSIS — F329 Major depressive disorder, single episode, unspecified: Secondary | ICD-10-CM

## 2017-06-18 DIAGNOSIS — F419 Anxiety disorder, unspecified: Principal | ICD-10-CM

## 2017-06-18 NOTE — Telephone Encounter (Signed)
This form is on our clinic desk, can you see if we can fill this out please

## 2017-06-18 NOTE — Telephone Encounter (Signed)
Marky with [employer] called advised number 1,3 and 4 was incomplete on the form. He asked if the form can be faxed back today as possible. The number to contact Susa Raring is [phone]

## 2017-06-18 NOTE — Telephone Encounter (Signed)
Marky from [employer], called in reference to the back brace for the patient.  He wanted to know if Dr. Ninfa Linden has signed off on the paperwork.  CB#[phone].  Thank you.

## 2017-06-18 NOTE — Telephone Encounter (Signed)
faxed

## 2017-06-21 ENCOUNTER — Other Ambulatory Visit: Payer: Self-pay | Admitting: Physician Assistant

## 2017-06-21 DIAGNOSIS — Z23 Encounter for immunization: Secondary | ICD-10-CM | POA: Diagnosis not present

## 2017-06-21 DIAGNOSIS — E119 Type 2 diabetes mellitus without complications: Secondary | ICD-10-CM

## 2017-06-21 NOTE — Telephone Encounter (Signed)
rx filled per protocol  

## 2017-06-22 ENCOUNTER — Other Ambulatory Visit: Payer: Self-pay | Admitting: Physician Assistant

## 2017-06-27 ENCOUNTER — Other Ambulatory Visit: Payer: Self-pay | Admitting: Family Medicine

## 2017-06-27 ENCOUNTER — Other Ambulatory Visit: Payer: Self-pay | Admitting: *Deleted

## 2017-06-27 DIAGNOSIS — E119 Type 2 diabetes mellitus without complications: Secondary | ICD-10-CM

## 2017-06-27 MED ORDER — METFORMIN HCL 500 MG PO TABS: 500.0000 mg | ORAL_TABLET | Freq: Two times a day (BID) | ORAL | 1 refills | Status: DC

## 2017-06-27 NOTE — Telephone Encounter (Signed)
Error faxed to incorrect office

## 2017-07-10 ENCOUNTER — Other Ambulatory Visit: Payer: Self-pay | Admitting: Family Medicine

## 2017-07-10 DIAGNOSIS — G629 Polyneuropathy, unspecified: Secondary | ICD-10-CM

## 2017-07-10 MED ORDER — GABAPENTIN 800 MG PO TABS: 800.0000 mg | ORAL_TABLET | Freq: Two times a day (BID) | ORAL | 1 refills | Status: DC

## 2017-07-11 ENCOUNTER — Other Ambulatory Visit: Payer: Self-pay

## 2017-07-11 DIAGNOSIS — G629 Polyneuropathy, unspecified: Secondary | ICD-10-CM

## 2017-07-11 MED ORDER — GABAPENTIN 800 MG PO TABS: 800.0000 mg | ORAL_TABLET | Freq: Two times a day (BID) | ORAL | 1 refills | Status: DC

## 2017-07-20 ENCOUNTER — Other Ambulatory Visit: Payer: Self-pay

## 2017-07-20 ENCOUNTER — Ambulatory Visit: Payer: Medicare Other | Admitting: Family Medicine

## 2017-07-20 DIAGNOSIS — M25511 Pain in right shoulder: Principal | ICD-10-CM

## 2017-07-20 DIAGNOSIS — G8929 Other chronic pain: Secondary | ICD-10-CM

## 2017-07-20 DIAGNOSIS — M545 Low back pain, unspecified: Secondary | ICD-10-CM

## 2017-07-20 DIAGNOSIS — M25512 Pain in left shoulder: Principal | ICD-10-CM

## 2017-07-20 MED ORDER — HYDROCODONE-ACETAMINOPHEN 5-325 MG PO TABS: 1.0000 | ORAL_TABLET | Freq: Every evening | ORAL | 0 refills | Status: DC | PRN

## 2017-07-20 NOTE — Telephone Encounter (Signed)
LAST OV 04/18/2017 LAST REFILL 06/11/2017 OK TO REFILL?

## 2017-07-20 NOTE — Telephone Encounter (Signed)
Can you sign this rx  Please? Olean Ree approved it

## 2017-07-20 NOTE — Telephone Encounter (Signed)
Approved. # 30 + 0. 

## 2017-07-20 NOTE — Telephone Encounter (Signed)
okay

## 2017-07-20 NOTE — Telephone Encounter (Signed)
Patient is aware she can pick up rx today

## 2017-07-30 ENCOUNTER — Encounter: Payer: Self-pay | Admitting: Physician Assistant

## 2017-07-30 ENCOUNTER — Ambulatory Visit (HOSPITAL_COMMUNITY)
Admission: RE | Admit: 2017-07-30 | Discharge: 2017-07-30 | Disposition: A | Discharge status: Home / Self Care | Payer: Medicare Other | Source: Ambulatory Visit | Attending: Physician Assistant | Admitting: Physician Assistant

## 2017-07-30 ENCOUNTER — Ambulatory Visit (INDEPENDENT_AMBULATORY_CARE_PROVIDER_SITE_OTHER): Payer: Medicare Other | Admitting: Physician Assistant

## 2017-07-30 VITALS — BP 134/88 | HR 81 | Temp 97.8°F | Resp 14 | Ht 63.0 in | Wt 163.8 lb

## 2017-07-30 DIAGNOSIS — I10 Essential (primary) hypertension: Secondary | ICD-10-CM | POA: Diagnosis not present

## 2017-07-30 DIAGNOSIS — J449 Chronic obstructive pulmonary disease, unspecified: Secondary | ICD-10-CM | POA: Diagnosis not present

## 2017-07-30 DIAGNOSIS — E119 Type 2 diabetes mellitus without complications: Secondary | ICD-10-CM

## 2017-07-30 DIAGNOSIS — M25572 Pain in left ankle and joints of left foot: Secondary | ICD-10-CM

## 2017-07-30 DIAGNOSIS — X58XXXA Exposure to other specified factors, initial encounter: Secondary | ICD-10-CM | POA: Insufficient documentation

## 2017-07-30 DIAGNOSIS — F329 Major depressive disorder, single episode, unspecified: Secondary | ICD-10-CM

## 2017-07-30 DIAGNOSIS — F419 Anxiety disorder, unspecified: Secondary | ICD-10-CM

## 2017-07-30 DIAGNOSIS — M7732 Calcaneal spur, left foot: Secondary | ICD-10-CM | POA: Insufficient documentation

## 2017-07-30 DIAGNOSIS — S8252XA Displaced fracture of medial malleolus of left tibia, initial encounter for closed fracture: Secondary | ICD-10-CM | POA: Insufficient documentation

## 2017-07-30 DIAGNOSIS — F32A Depression, unspecified: Secondary | ICD-10-CM

## 2017-07-30 DIAGNOSIS — K219 Gastro-esophageal reflux disease without esophagitis: Secondary | ICD-10-CM

## 2017-07-30 DIAGNOSIS — S99912A Unspecified injury of left ankle, initial encounter: Secondary | ICD-10-CM | POA: Diagnosis not present

## 2017-07-30 DIAGNOSIS — E7800 Pure hypercholesterolemia, unspecified: Secondary | ICD-10-CM

## 2017-07-30 DIAGNOSIS — M7989 Other specified soft tissue disorders: Secondary | ICD-10-CM | POA: Insufficient documentation

## 2017-07-30 MED ORDER — BUPROPION HCL ER (SR) 150 MG PO TB12: ORAL_TABLET | ORAL | 2 refills | Status: DC
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
## 2017-07-31 LAB — COMPLETE METABOLIC PANEL WITH GFR
AG Ratio: 1.8 (calc) (ref 1.0–2.5)
ALBUMIN MSPROF: 4.5 g/dL (ref 3.6–5.1)
ALKALINE PHOSPHATASE (APISO): 56 U/L (ref 33–130)
ALT: 13 U/L (ref 6–29)
AST: 19 U/L (ref 10–35)
BILIRUBIN TOTAL: 0.5 mg/dL (ref 0.2–1.2)
BUN / CREAT RATIO: 16 (calc) (ref 6–22)
BUN: 16 mg/dL (ref 7–25)
CHLORIDE: 103 mmol/L (ref 98–110)
CO2: 29 mmol/L (ref 20–32)
CREATININE: 1.03 mg/dL — AB (ref 0.50–0.99)
Calcium: 9.8 mg/dL (ref 8.6–10.4)
GFR, EST AFRICAN AMERICAN: 66 mL/min/{1.73_m2} (ref >=60)
GFR, Est Non African American: 57 mL/min/{1.73_m2} — ABNORMAL LOW (ref >=60)
GLUCOSE: 85 mg/dL (ref 65–99)
Globulin: 2.5 g/dL (calc) (ref 1.9–3.7)
Potassium: 4.2 mmol/L (ref 3.5–5.3)
Sodium: 141 mmol/L (ref 135–146)
TOTAL PROTEIN: 7 g/dL (ref 6.1–8.1)

## 2017-07-31 LAB — HEMOGLOBIN A1C
EAG (MMOL/L): 6.6 (calc)
HEMOGLOBIN A1C: 5.8 %{Hb} — AB (ref <5.7)
MEAN PLASMA GLUCOSE: 120 (calc)

## 2017-08-05 ENCOUNTER — Encounter (HOSPITAL_COMMUNITY): Payer: Self-pay

## 2017-08-05 ENCOUNTER — Emergency Department (HOSPITAL_COMMUNITY)
Admission: EM | Admit: 2017-08-05 | Discharge: 2017-08-05 | Disposition: A | Discharge status: Home / Self Care | Payer: Medicare Other | Attending: Emergency Medicine | Admitting: Emergency Medicine

## 2017-08-05 DIAGNOSIS — E7800 Pure hypercholesterolemia, unspecified: Secondary | ICD-10-CM | POA: Insufficient documentation

## 2017-08-05 DIAGNOSIS — Z853 Personal history of malignant neoplasm of breast: Secondary | ICD-10-CM | POA: Diagnosis not present

## 2017-08-05 DIAGNOSIS — I10 Essential (primary) hypertension: Secondary | ICD-10-CM | POA: Insufficient documentation

## 2017-08-05 DIAGNOSIS — Z793 Long term (current) use of hormonal contraceptives: Secondary | ICD-10-CM | POA: Diagnosis not present

## 2017-08-05 DIAGNOSIS — E119 Type 2 diabetes mellitus without complications: Secondary | ICD-10-CM | POA: Diagnosis not present

## 2017-08-05 DIAGNOSIS — Z85118 Personal history of other malignant neoplasm of bronchus and lung: Secondary | ICD-10-CM | POA: Insufficient documentation

## 2017-08-05 DIAGNOSIS — Z79899 Other long term (current) drug therapy: Secondary | ICD-10-CM | POA: Insufficient documentation

## 2017-08-05 DIAGNOSIS — E785 Hyperlipidemia, unspecified: Secondary | ICD-10-CM | POA: Insufficient documentation

## 2017-08-05 DIAGNOSIS — J449 Chronic obstructive pulmonary disease, unspecified: Secondary | ICD-10-CM | POA: Insufficient documentation

## 2017-08-05 DIAGNOSIS — K0889 Other specified disorders of teeth and supporting structures: Secondary | ICD-10-CM | POA: Diagnosis not present

## 2017-08-05 DIAGNOSIS — Z87891 Personal history of nicotine dependence: Secondary | ICD-10-CM | POA: Insufficient documentation

## 2017-08-05 DIAGNOSIS — Z7982 Long term (current) use of aspirin: Secondary | ICD-10-CM | POA: Insufficient documentation

## 2017-08-05 MED ORDER — CLINDAMYCIN HCL 150 MG PO CAPS
450.0000 mg | ORAL_CAPSULE | Freq: Once | ORAL | Status: AC
  Administered 2017-08-05: 450 mg via ORAL
  Filled 2017-08-05: qty 3

## 2017-08-05 MED ORDER — HYDROCODONE-ACETAMINOPHEN 5-325 MG PO TABS
2.0000 | ORAL_TABLET | Freq: Once | ORAL | Status: AC
  Administered 2017-08-05: 2 via ORAL
  Filled 2017-08-05: qty 2

## 2017-08-05 MED ORDER — CLINDAMYCIN HCL 150 MG PO CAPS: 450.0000 mg | ORAL_CAPSULE | Freq: Three times a day (TID) | ORAL | 0 refills | Status: AC

## 2017-08-05 NOTE — Discharge Instructions (Signed)
Medications: clindamycin  Treatment: Take clindamycin 3 times daily for 7 days.  Make sure to finish all this medication.  I recommend taking a probiotic with this medication once daily.  You can alternate ibuprofen and Tylenol as prescribed over-the-counter, as needed for your pain.  Follow-up: Please follow-up with your dentist as soon as possible for further evaluation and treatment.  Please return to the emergency department if you develop any new or worsening symptoms.

## 2017-08-05 NOTE — ED Triage Notes (Signed)
Pt c/o left sided toothache x 1 week. Reports tooth is broken.

## 2017-08-05 NOTE — ED Provider Notes (Signed)
[facility] EMERGENCY DEPARTMENT Provider Note   CSN: [id number] Arrival date & time: 08/05/17  4356     History   Chief Complaint Chief Complaint  Patient presents with  . Dental Pain    HPI Deborah Mullins is a 66 y.o. female with history of hypertension, diabetes, chronic pain, bipolar 1 disorder who presents with a one-week history of left upper dental pain.  Patient reports she has a chronically fractured tooth which she has not gotten taken care of, however began hurting again 1 week ago.  Patient reports she has had infection in the tooth before.  She feels like it is beginning to abscess.  Her pain is worse with chewing and with temperatures.  She reports she has had some sweats at night since onset, but is unsure about fevers.  She denies any neck pain.  She has had pain in her left ear.  She has been taking ibuprofen at home without relief.  HPI  Past Medical History:  Diagnosis Date  . Allergic rhinitis   . Allergy   . Anxiety   . Arthritis   . Bipolar 1 disorder ([place])   . Cancer [facility]) 2004   breast   . Carpal tunnel syndrome of left wrist   . Chronic neck pain   . Chronic shoulder pain    L>R  . Depression   . Diabetes mellitus without complication ([place])   . GERD (gastroesophageal reflux disease)   . Headache(784.0)   . History of lung surgery   . Hyperlipidemia   . Hypertension   . Lung cancer ([place])   . Lung nodule 2009  . Pain in both feet   . Personal history of radiation therapy 2004  . PONV (postoperative nausea and vomiting)   . Prediabetes    a1c 6.4 12/16  . Shortness of breath    exertion    Patient Active Problem List   Diagnosis Date Noted  . Essential hypertension 01/22/2017  . Controlled type 2 diabetes mellitus without complication, without long-term current use of insulin ([place]) 11/23/2016  . Chronic pain of both shoulders 07/24/2016  . Prediabetes   . Anxiety   . Bipolar 1 disorder ([place])   . Depression   . Chronic back pain  11/07/2012  . Insomnia secondary to depression with anxiety 09/23/2012  . Unspecified vitamin D deficiency 09/23/2012  . COPD, mild ([place]) 07/01/2012  . Acute bronchitis 06/08/2012  . Adenocarcinoma of lung, stage 1 ([place]) 05/23/2012  . RECTAL BLEEDING 04/27/2010  . HEMORRHOIDS 03/11/2009  . ADENOCARCINOMA, BREAST 12/15/2008  . FIBROIDS, UTERUS 12/15/2008  . HYPERCHOLESTEROLEMIA 12/15/2008  . HYPERLIPIDEMIA 12/15/2008  . Anxiety and depression 12/15/2008  . GASTROESOPHAGEAL REFLUX DISEASE 12/15/2008  . GASTRITIS 12/15/2008  . CONSTIPATION 12/15/2008  . FLATULENCE ERUCTATION AND GAS PAIN 12/15/2008  . ABDOMINAL PAIN 12/15/2008  . RECTAL BLEEDING, HX OF 12/15/2008    Past Surgical History:  Procedure Laterality Date  . BREAST SURGERY  2005   lumpectomy - left  . carpel tunnel Right   . CESAREAN SECTION  1971  . COLONOSCOPY WITH PROPOFOL N/A 07/25/2016   Procedure: COLONOSCOPY WITH PROPOFOL;  Surgeon: Danie Binder, MD;  Location: [facility]O SUITE;  Service: Endoscopy;  Laterality: N/A;  8:15 AM  . EYE SURGERY    . LUNG CANCER SURGERY Right 05/23/2012   reportedly clear  . TONSILLECTOMY    . TUBAL LIGATION      OB History    Gravida Para Term Preterm AB Living  _0 SAB TAB Ectopic Multiple Live Births                   Home Medications    Prior to Admission medications   Medication Sig Start Date End Date Taking? Authorizing Provider  albuterol (PROVENTIL HFA;VENTOLIN HFA) 108 (90 Base) MCG/ACT inhaler Inhale 2 puffs into the lungs every 4 (four) hours as needed for wheezing or shortness of breath. 07/11/16   Francine Graven, DO  aspirin 81 MG tablet Take 1 tablet (81 mg total) by mouth daily. 01/22/17   Dena Billet B, PA-C  blood glucose meter kit and supplies KIT Check up to twice a day before meals 04/18/17   Orlena Sheldon, PA-C  buPROPion [facility] SR) 150 MG 12 hr tablet Take once daily for 5 days then increase to one twice a day 07/30/17   Orlena Sheldon,  PA-C  cetirizine (ZYRTEC) 10 MG tablet Take 1 tablet (10 mg total) by mouth daily. 10/11/16   Eustaquio Maize, MD  FLUoxetine (PROZAC) 40 MG capsule Take 1 capsule (40 mg total) by mouth daily. 04/18/17 04/18/18  Orlena Sheldon, PA-C  fluticasone (FLONASE) 50 MCG/ACT nasal spray Place 2 sprays into both nostrils daily. 10/11/16   Eustaquio Maize, MD  gabapentin (NEURONTIN) 800 MG tablet Take 1 tablet (800 mg total) by mouth 2 (two) times daily. 07/11/17   Dena Billet B, PA-C  glucose blood test strip Check up to twice a day 01/23/17   Susy Frizzle, MD  HYDROcodone-acetaminophen (NORCO/VICODIN) 5-325 MG tablet Take 1 tablet by mouth every 4 (four) hours as needed. 03/27/17   Lily Kocher, PA-C  ibuprofen (ADVIL,MOTRIN) 800 MG tablet Take 1 tablet (800 mg total) by mouth every 8 (eight) hours as needed. 12/26/16   Mcarthur Rossetti, MD  Lancets [facility] ULTRASOFT) lancets Use as instructed 10/31/16   Eustaquio Maize, MD  losartan (COZAAR) 50 MG tablet Take 1 tablet (50 mg total) by mouth daily. 01/22/17   Orlena Sheldon, PA-C  meloxicam (MOBIC) 15 MG tablet Take 1 tablet (15 mg total) by mouth daily. 03/27/17   Lily Kocher, PA-C  metFORMIN (GLUCOPHAGE) 500 MG tablet Take 1 tablet (500 mg total) 2 (two) times daily with a meal by mouth. 06/27/17   Orlena Sheldon, PA-C  Multiple Vitamins-Minerals (CENTURY SENIOR PO) Take by mouth daily.    [provider]  NON FORMULARY [facility]  Peripheral Neuropathy Cream- Bupivacaine 1%, Doxepin 3%, Gabapentin 6%, Pentoxifylline 3%, Topiramate 1% Apply 1-2 grams to affected area 3-4 times daily Qty. 120 gm 3 refills    [provider]  pantoprazole (PROTONIX) 40 MG tablet TAKE 1 TABLET (40 MG TOTAL) BY MOUTH DAILY. 04/18/17   Dena Billet B, PA-C  polyethylene glycol (MIRALAX / GLYCOLAX) packet Take 17 g by mouth daily.    [provider]  rosuvastatin (CRESTOR) 40 MG tablet TAKE 1 TABLET BY MOUTH EVERY DAY 06/22/17   Susy Frizzle, MD    Family History Family History  Problem Relation Age of Onset  . Heart disease Mother        CHF  . Physical abuse Mother   . Depression Mother   . Cancer Sister   . ADD / ADHD Sister   . Alcohol abuse Father   . Alcohol abuse Brother   . Mental illness Brother   . Cancer Sister   . Heart disease Sister   .  Physical abuse Sister   . OCD Sister   . Mental illness Sister   . Anxiety disorder Neg Hx   . Bipolar disorder Neg Hx   . Dementia Neg Hx   . Drug abuse Neg Hx   . Paranoid behavior Neg Hx   . Schizophrenia Neg Hx   . Seizures Neg Hx   . Sexual abuse Neg Hx     Social History Social History   Tobacco Use  . Smoking status: Former Smoker    Packs/day: 1.00    Years: 10.00    Pack years: 10.00    Types: Cigarettes, E-cigarettes    Last attempt to quit: 08/14/1974    Years since quitting: 43.0  . Smokeless tobacco: Never Used  Substance Use Topics  . Alcohol use: Yes    Comment: occasionally   . Drug use: Yes    Types: Marijuana    Comment: last used a few days ago as of 03/27/17     Allergies   Bee venom and Penicillins   Review of Systems Review of Systems  Constitutional: Negative for fever.  HENT: Positive for dental problem.   Musculoskeletal: Negative for neck pain.     Physical Exam Updated Vital Signs BP (!) 178/103 (BP Location: Right Arm)   Pulse 71   Temp (!) 97.5 F (36.4 C) (Oral)   Resp 18   Ht 5' 3" (1.6 m)   Wt 75.3 kg (166 lb)   SpO2 100%   BMI 29.41 kg/m   Physical Exam  Constitutional: She appears well-developed and well-nourished. No distress.  HENT:  Head: Normocephalic and atraumatic.  Right Ear: Tympanic membrane normal.  Left Ear: Tympanic membrane normal.  Mouth/Throat: Oropharynx is clear and moist. No dental abscesses. No oropharyngeal exudate, posterior oropharyngeal edema, posterior oropharyngeal erythema or tonsillar abscesses.    Eyes: Conjunctivae are normal. Pupils are equal, round, and  reactive to light. Right eye exhibits no discharge. Left eye exhibits no discharge. No scleral icterus.  Neck: Normal range of motion. Neck supple. No thyromegaly present.  Cardiovascular: Normal rate, regular rhythm, normal heart sounds and intact distal pulses. Exam reveals no gallop and no friction rub.  No murmur heard. Pulmonary/Chest: Effort normal and breath sounds normal. No stridor. No respiratory distress. She has no wheezes. She has no rales.  Musculoskeletal: She exhibits no edema.  Lymphadenopathy:    She has no cervical adenopathy.  Neurological: She is alert. Coordination normal.  Skin: Skin is warm and dry. No rash noted. She is not diaphoretic. No pallor.  Psychiatric: She has a normal mood and affect.  Nursing note and vitals reviewed.    ED Treatments / Results  Labs (all labs ordered are listed, but only abnormal results are displayed) Labs Reviewed - No data to display  EKG  EKG Interpretation None       Radiology No results found.  Procedures Procedures (including critical care time)  Medications Ordered in ED Medications  clindamycin (CLEOCIN) capsule 450 mg (not administered)  HYDROcodone-acetaminophen (NORCO/VICODIN) 5-325 MG per tablet 2 tablet (not administered)     Initial Impression / Assessment and Plan / ED Course  I have reviewed the triage vital signs and the nursing notes.  Pertinent labs & imaging results that were available during my care of the patient were reviewed by me and considered in my medical decision making (see chart for details).     Patient with dentalgia.  No abscess requiring immediate incision and drainage.  Exam not  concerning for Ludwig's angina or pharyngeal abscess.  Will treat with clindamycin, ibuprofen, Tylenol. I also recommended adding probiotics. Pt instructed to follow-up with dentist as soon as possible. Patient does have a dentist. Discussed return precautions. Pt safe for discharge.   Final Clinical  Impressions(s) / ED Diagnoses   Final diagnoses:  Pain, dental    ED Discharge Orders    None       Frederica Kuster, PA-C 08/05/17 [place], [place], DO 08/07/17 [phone]

## 2017-08-16 ENCOUNTER — Other Ambulatory Visit: Payer: Self-pay

## 2017-08-16 NOTE — Telephone Encounter (Signed)
Denied.  (FYI----This was prescribed 03/2017 from the ER for just #12.)

## 2017-08-16 NOTE — Telephone Encounter (Signed)
Last OV 12-17 Last refill 03/27/2017 Ok to refill?

## 2017-08-16 NOTE — Telephone Encounter (Signed)
Called placed to patient that request has been denied

## 2017-09-10 ENCOUNTER — Ambulatory Visit (INDEPENDENT_AMBULATORY_CARE_PROVIDER_SITE_OTHER): Payer: Medicare Other | Admitting: Physician Assistant

## 2017-09-10 ENCOUNTER — Other Ambulatory Visit: Payer: Self-pay

## 2017-09-10 ENCOUNTER — Encounter: Payer: Self-pay | Admitting: Physician Assistant

## 2017-09-10 VITALS — BP 134/88 | HR 79 | Temp 97.6°F | Resp 16 | Ht 63.0 in | Wt 165.4 lb

## 2017-09-10 DIAGNOSIS — M25512 Pain in left shoulder: Secondary | ICD-10-CM

## 2017-09-10 DIAGNOSIS — G8929 Other chronic pain: Secondary | ICD-10-CM

## 2017-09-10 DIAGNOSIS — E119 Type 2 diabetes mellitus without complications: Secondary | ICD-10-CM

## 2017-09-10 DIAGNOSIS — F32A Depression, unspecified: Secondary | ICD-10-CM

## 2017-09-10 DIAGNOSIS — I10 Essential (primary) hypertension: Secondary | ICD-10-CM | POA: Diagnosis not present

## 2017-09-10 DIAGNOSIS — K219 Gastro-esophageal reflux disease without esophagitis: Secondary | ICD-10-CM

## 2017-09-10 DIAGNOSIS — F329 Major depressive disorder, single episode, unspecified: Secondary | ICD-10-CM

## 2017-09-10 DIAGNOSIS — F419 Anxiety disorder, unspecified: Secondary | ICD-10-CM | POA: Diagnosis not present

## 2017-09-10 DIAGNOSIS — M25511 Pain in right shoulder: Secondary | ICD-10-CM

## 2017-09-10 MED ORDER — ROSUVASTATIN CALCIUM 40 MG PO TABS
40.0000 mg | ORAL_TABLET | Freq: Every day | ORAL | 1 refills | Status: DC
Start: 2017-09-10 — End: 2018-03-29

## 2017-09-10 MED ORDER — MELOXICAM 7.5 MG PO TABS: 7.5000 mg | ORAL_TABLET | Freq: Every day | ORAL | 2 refills | Status: DC

## 2017-09-10 MED ORDER — LOSARTAN POTASSIUM 50 MG PO TABS: 50.0000 mg | ORAL_TABLET | Freq: Every day | ORAL | 3 refills | Status: DC
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
## 2017-10-10 ENCOUNTER — Encounter (HOSPITAL_COMMUNITY): Payer: Self-pay | Admitting: Emergency Medicine

## 2017-10-10 ENCOUNTER — Other Ambulatory Visit: Payer: Self-pay

## 2017-10-10 ENCOUNTER — Emergency Department (HOSPITAL_COMMUNITY)
Admission: EM | Admit: 2017-10-10 | Discharge: 2017-10-10 | Disposition: A | Discharge status: Home / Self Care | Payer: Medicare Other | Attending: Emergency Medicine | Admitting: Emergency Medicine

## 2017-10-10 DIAGNOSIS — R197 Diarrhea, unspecified: Secondary | ICD-10-CM

## 2017-10-10 DIAGNOSIS — R112 Nausea with vomiting, unspecified: Secondary | ICD-10-CM | POA: Insufficient documentation

## 2017-10-10 DIAGNOSIS — Z5321 Procedure and treatment not carried out due to patient leaving prior to being seen by health care provider: Secondary | ICD-10-CM | POA: Diagnosis not present

## 2017-10-10 LAB — COMPREHENSIVE METABOLIC PANEL
ALBUMIN: 4.4 g/dL (ref 3.5–5.0)
ALK PHOS: 65 U/L (ref 38–126)
ALT: 20 U/L (ref 14–54)
AST: 22 U/L (ref 15–41)
Anion gap: 8 (ref 5–15)
BILIRUBIN TOTAL: 0.4 mg/dL (ref 0.3–1.2)
BUN: 13 mg/dL (ref 6–20)
CALCIUM: 9.6 mg/dL (ref 8.9–10.3)
CO2: 28 mmol/L (ref 22–32)
CREATININE: 0.91 mg/dL (ref 0.44–1.00)
Chloride: 105 mmol/L (ref 101–111)
GFR calc Af Amer: >60 mL/min (ref >60)
GLUCOSE: 118 mg/dL — AB (ref 65–99)
Potassium: 4 mmol/L (ref 3.5–5.1)
Sodium: 141 mmol/L (ref 135–145)
TOTAL PROTEIN: 7.3 g/dL (ref 6.5–8.1)

## 2017-10-10 LAB — CBC
HCT: 39.9 % (ref 36.0–46.0)
Hemoglobin: 12.4 g/dL (ref 12.0–15.0)
MCH: 29 pg (ref 26.0–34.0)
MCHC: 31.1 g/dL (ref 30.0–36.0)
MCV: 93.4 fL (ref 78.0–100.0)
Platelets: 222 10*3/uL (ref 150–400)
RBC: 4.27 MIL/uL (ref 3.87–5.11)
RDW: 13 % (ref 11.5–15.5)
WBC: 7 10*3/uL (ref 4.0–10.5)

## 2017-10-10 LAB — LIPASE, BLOOD: Lipase: 32 U/L (ref 11–51)

## 2017-10-10 NOTE — ED Triage Notes (Signed)
Patient states she was up all night with emesis and diarrhea. Patient states her blood pressure is elevated. Patient reports polyuria during the night.

## 2017-10-13 ENCOUNTER — Emergency Department (HOSPITAL_COMMUNITY): Payer: Medicare Other

## 2017-10-13 ENCOUNTER — Emergency Department (HOSPITAL_COMMUNITY)
Admission: EM | Admit: 2017-10-13 | Discharge: 2017-10-13 | Disposition: A | Discharge status: Home / Self Care | Payer: Medicare Other | Attending: Emergency Medicine | Admitting: Emergency Medicine

## 2017-10-13 ENCOUNTER — Encounter (HOSPITAL_COMMUNITY): Payer: Self-pay

## 2017-10-13 DIAGNOSIS — Z7982 Long term (current) use of aspirin: Secondary | ICD-10-CM | POA: Insufficient documentation

## 2017-10-13 DIAGNOSIS — Z923 Personal history of irradiation: Secondary | ICD-10-CM | POA: Insufficient documentation

## 2017-10-13 DIAGNOSIS — E119 Type 2 diabetes mellitus without complications: Secondary | ICD-10-CM | POA: Diagnosis not present

## 2017-10-13 DIAGNOSIS — J449 Chronic obstructive pulmonary disease, unspecified: Secondary | ICD-10-CM | POA: Insufficient documentation

## 2017-10-13 DIAGNOSIS — Z87891 Personal history of nicotine dependence: Secondary | ICD-10-CM | POA: Diagnosis not present

## 2017-10-13 DIAGNOSIS — Z7984 Long term (current) use of oral hypoglycemic drugs: Secondary | ICD-10-CM | POA: Insufficient documentation

## 2017-10-13 DIAGNOSIS — Z79899 Other long term (current) drug therapy: Secondary | ICD-10-CM | POA: Diagnosis not present

## 2017-10-13 DIAGNOSIS — Z85118 Personal history of other malignant neoplasm of bronchus and lung: Secondary | ICD-10-CM | POA: Diagnosis not present

## 2017-10-13 DIAGNOSIS — I10 Essential (primary) hypertension: Secondary | ICD-10-CM | POA: Diagnosis not present

## 2017-10-13 DIAGNOSIS — Z853 Personal history of malignant neoplasm of breast: Secondary | ICD-10-CM | POA: Insufficient documentation

## 2017-10-13 DIAGNOSIS — R079 Chest pain, unspecified: Secondary | ICD-10-CM | POA: Diagnosis not present

## 2017-10-13 DIAGNOSIS — K047 Periapical abscess without sinus: Secondary | ICD-10-CM | POA: Insufficient documentation

## 2017-10-13 DIAGNOSIS — F1210 Cannabis abuse, uncomplicated: Secondary | ICD-10-CM | POA: Diagnosis not present

## 2017-10-13 DIAGNOSIS — K0889 Other specified disorders of teeth and supporting structures: Secondary | ICD-10-CM | POA: Diagnosis not present

## 2017-10-13 LAB — CBG MONITORING, ED: GLUCOSE-CAPILLARY: 123 mg/dL — AB (ref 65–99)

## 2017-10-13 MED ORDER — CLINDAMYCIN HCL 150 MG PO CAPS
300.0000 mg | ORAL_CAPSULE | Freq: Once | ORAL | Status: AC
  Administered 2017-10-13: 300 mg via ORAL
  Filled 2017-10-13: qty 2

## 2017-10-13 MED ORDER — TRAMADOL HCL 50 MG PO TABS
100.0000 mg | ORAL_TABLET | Freq: Once | ORAL | Status: AC
  Administered 2017-10-13: 100 mg via ORAL
  Filled 2017-10-13: qty 2

## 2017-10-13 MED ORDER — CLINDAMYCIN HCL 300 MG PO CAPS: 300.0000 mg | ORAL_CAPSULE | Freq: Three times a day (TID) | ORAL | 0 refills | Status: DC

## 2017-10-13 MED ORDER — TRAMADOL HCL 50 MG PO TABS: 50.0000 mg | ORAL_TABLET | Freq: Four times a day (QID) | ORAL | 0 refills | Status: DC | PRN

## 2017-10-13 NOTE — Discharge Instructions (Signed)
Use heat on the sore area of your face, 3 or 4 times a day for 30 minutes.  Follow-up with a dentist or oral surgeon, as soon as possible for extraction of the affected tooth.

## 2017-10-13 NOTE — ED Provider Notes (Signed)
[facility] EMERGENCY DEPARTMENT Provider Note   CSN: [id number] Arrival date & time: 10/13/17  0806     History   Chief Complaint Chief Complaint  Patient presents with  . Dental Pain  . Hypertension    HPI Deborah Mullins is a 67 y.o. female.  She complains of pain in left face, that she feels is because of a tooth infection.  She has had this problem previously, has a broken tooth, and does not have a dentist.  She also is worried that her lung cancer might be coming back because she feels like she has lymph nodes in her back.  She localized the area of the lymph nodes to the lateral scapular regions bilaterally.  She denies weight loss.  She has remote history of both breast cancer and lung cancer, both treated surgically and she also had radiation to the left breast.  She denies cough, shortness of breath, fever, chills, nausea or vomiting.  There are no other known modifying factors.  HPI  Past Medical History:  Diagnosis Date  . Allergic rhinitis   . Allergy   . Anxiety   . Arthritis   . Bipolar 1 disorder ([place])   . Cancer [facility]) 2004   breast   . Carpal tunnel syndrome of left wrist   . Chronic neck pain   . Chronic shoulder pain    L>R  . Depression   . Diabetes mellitus without complication ([place])   . GERD (gastroesophageal reflux disease)   . Headache(784.0)   . History of lung surgery   . Hyperlipidemia   . Hypertension   . Lung cancer ([place])   . Lung nodule 2009  . Pain in both feet   . Personal history of radiation therapy 2004  . PONV (postoperative nausea and vomiting)   . Prediabetes    a1c 6.4 12/16  . Shortness of breath    exertion    Patient Active Problem List   Diagnosis Date Noted  . Essential hypertension 01/22/2017  . Controlled type 2 diabetes mellitus without complication, without long-term current use of insulin ([place]) 11/23/2016  . Chronic pain of both shoulders 07/24/2016  . Prediabetes   . Anxiety   . Bipolar 1 disorder ([place])     . Depression   . Chronic back pain 11/07/2012  . Insomnia secondary to depression with anxiety 09/23/2012  . Unspecified vitamin D deficiency 09/23/2012  . COPD, mild ([place]) 07/01/2012  . Acute bronchitis 06/08/2012  . Adenocarcinoma of lung, stage 1 ([place]) 05/23/2012  . RECTAL BLEEDING 04/27/2010  . HEMORRHOIDS 03/11/2009  . ADENOCARCINOMA, BREAST 12/15/2008  . FIBROIDS, UTERUS 12/15/2008  . HYPERCHOLESTEROLEMIA 12/15/2008  . Hyperlipidemia 12/15/2008  . Anxiety and depression 12/15/2008  . GASTROESOPHAGEAL REFLUX DISEASE 12/15/2008  . GASTRITIS 12/15/2008  . CONSTIPATION 12/15/2008  . FLATULENCE ERUCTATION AND GAS PAIN 12/15/2008  . ABDOMINAL PAIN 12/15/2008  . RECTAL BLEEDING, HX OF 12/15/2008    Past Surgical History:  Procedure Laterality Date  . BREAST SURGERY  2005   lumpectomy - left  . carpel tunnel Right   . CESAREAN SECTION  1971  . COLONOSCOPY WITH PROPOFOL N/A 07/25/2016   Procedure: COLONOSCOPY WITH PROPOFOL;  Surgeon: Danie Binder, MD;  Location: [facility] ENDO SUITE;  Service: Endoscopy;  Laterality: N/A;  8:15 AM  . EYE SURGERY    . LUNG CANCER SURGERY Right 05/23/2012   reportedly clear  . TONSILLECTOMY    . TUBAL LIGATION      OB History  Gravida Para Term Preterm AB Living   '1 1 1     1   '$ SAB TAB Ectopic Multiple Live Births                   Home Medications    Prior to Admission medications   Medication Sig Start Date End Date Taking? Authorizing Provider  albuterol (PROVENTIL HFA;VENTOLIN HFA) 108 (90 Base) MCG/ACT inhaler Inhale 2 puffs into the lungs every 4 (four) hours as needed for wheezing or shortness of breath. 07/11/16  Yes Francine Graven, DO  aspirin 81 MG tablet Take 1 tablet (81 mg total) by mouth daily. 01/22/17  Yes Orlena Sheldon, PA-C  buPROPion (WELLBUTRIN SR) 150 MG 12 hr tablet Take once daily for 5 days then increase to one twice a day 07/30/17  Yes Dixon, Lonie Peak, PA-C  cetirizine (ZYRTEC) 10 MG tablet Take 1 tablet (10 mg  total) by mouth daily. 10/11/16  Yes Eustaquio Maize, MD  FLUoxetine (PROZAC) 40 MG capsule Take 1 capsule (40 mg total) by mouth daily. 04/18/17 04/18/18 Yes Dixon, Mary B, PA-C  fluticasone (FLONASE) 50 MCG/ACT nasal spray Place 2 sprays into both nostrils daily. 10/11/16  Yes Eustaquio Maize, MD  gabapentin (NEURONTIN) 800 MG tablet Take 1 tablet (800 mg total) by mouth 2 (two) times daily. 07/11/17  Yes Dena Billet B, PA-C  glucose blood test strip Check up to twice a day 01/23/17  Yes Pickard, Cammie Mcgee, MD  ibuprofen (ADVIL,MOTRIN) 800 MG tablet Take 1 tablet (800 mg total) by mouth every 8 (eight) hours as needed. 12/26/16  Yes Mcarthur Rossetti, MD  Lancets [facility] ULTRASOFT) lancets Use as instructed 10/31/16  Yes Eustaquio Maize, MD  losartan (COZAAR) 50 MG tablet Take 1 tablet (50 mg total) by mouth daily. 09/10/17  Yes Orlena Sheldon, PA-C  meloxicam (MOBIC) 7.5 MG tablet Take 1 tablet (7.5 mg total) by mouth daily. Take with food. 09/10/17 09/10/18 Yes Orlena Sheldon, PA-C  metFORMIN (GLUCOPHAGE) 500 MG tablet Take 1 tablet (500 mg total) 2 (two) times daily with a meal by mouth. 06/27/17  Yes Orlena Sheldon, PA-C  NON FORMULARY [facility]  Peripheral Neuropathy Cream- Bupivacaine 1%, Doxepin 3%, Gabapentin 6%, Pentoxifylline 3%, Topiramate 1% Apply 1-2 grams to affected area 3-4 times daily Qty. 120 gm 3 refills   Yes [provider]  pantoprazole (PROTONIX) 40 MG tablet TAKE 1 TABLET (40 MG TOTAL) BY MOUTH DAILY. 04/18/17  Yes Dena Billet B, PA-C  polyethylene glycol (MIRALAX / GLYCOLAX) packet Take 17 g by mouth daily.   Yes [provider]  rosuvastatin (CRESTOR) 40 MG tablet Take 1 tablet (40 mg total) by mouth daily. 09/10/17  Yes Dena Billet B, PA-C  blood glucose meter kit and supplies KIT Check up to twice a day before meals 04/18/17   Dena Billet B, PA-C  clindamycin (CLEOCIN) 300 MG capsule Take 1 capsule (300 mg total) by mouth 3 (three) times daily. X 7  days 10/13/17   Daleen Bo, MD  HYDROcodone-acetaminophen (NORCO/VICODIN) 5-325 MG tablet Take 1 tablet by mouth every 4 (four) hours as needed. Patient not taking: Reported on 10/13/2017 03/27/17   Lily Kocher, PA-C  traMADol (ULTRAM) 50 MG tablet Take 1 tablet (50 mg total) by mouth every 6 (six) hours as needed. 10/13/17   Daleen Bo, MD    Family History Family History  Problem Relation Age of Onset  . Heart disease Mother  CHF  . Physical abuse Mother   . Depression Mother   . Cancer Sister   . ADD / ADHD Sister   . Alcohol abuse Father   . Alcohol abuse Brother   . Mental illness Brother   . Cancer Sister   . Heart disease Sister   . Physical abuse Sister   . OCD Sister   . Mental illness Sister   . Anxiety disorder Neg Hx   . Bipolar disorder Neg Hx   . Dementia Neg Hx   . Drug abuse Neg Hx   . Paranoid behavior Neg Hx   . Schizophrenia Neg Hx   . Seizures Neg Hx   . Sexual abuse Neg Hx     Social History Social History   Tobacco Use  . Smoking status: Former Smoker    Packs/day: 1.00    Years: 10.00    Pack years: 10.00    Types: Cigarettes    Last attempt to quit: 08/14/1974    Years since quitting: 43.1  . Smokeless tobacco: Never Used  Substance Use Topics  . Alcohol use: Yes    Comment: occasionally   . Drug use: Yes    Types: Marijuana     Allergies   Bee venom and Penicillins   Review of Systems Review of Systems  All other systems reviewed and are negative.    Physical Exam Updated Vital Signs BP (!) 156/99 (BP Location: Right Arm)   Pulse (!) 119 Comment: pt tearful  Temp 98.8 F (37.1 C) (Oral)   Resp 20   Ht '5\' 3"'$  (1.6 m)   Wt 74.8 kg (165 lb)   SpO2 96%   BMI 29.23 kg/m   Physical Exam  Constitutional: She is oriented to person, place, and time. She appears well-developed and well-nourished.  HENT:  Head: Normocephalic and atraumatic.  Eyes: Conjunctivae and EOM are normal. Pupils are equal, round, and reactive  to light.  Neck: Normal range of motion and phonation normal. Neck supple.  Cardiovascular: Normal rate and regular rhythm.  Pulmonary/Chest: Effort normal and breath sounds normal. She exhibits no tenderness.  Abdominal: Soft. She exhibits no distension. There is no tenderness. There is no guarding.  Musculoskeletal: Normal range of motion. She exhibits no edema or deformity.  Lymphadenopathy:    She has no cervical adenopathy.    She has no axillary adenopathy.  Neurological: She is alert and oriented to person, place, and time. She exhibits normal muscle tone.  Skin: Skin is warm and dry.  Psychiatric: She has a normal mood and affect. Her behavior is normal. Judgment and thought content normal.  Nursing note and vitals reviewed.    ED Treatments / Results  Labs (all labs ordered are listed, but only abnormal results are displayed) Labs Reviewed  CBG MONITORING, ED - Abnormal; Notable for the following components:      Result Value   Glucose-Capillary 123 (*)    All other components within normal limits    EKG  EKG Interpretation  Date/Time:  Saturday October 13 2017 08:35:24 EST Ventricular Rate:  106 PR Interval:    QRS Duration: 92 QT Interval:  331 QTC Calculation: 440 R Axis:   52 Text Interpretation:  Sinus tachycardia since last tracing no significant change Confirmed by Daleen Bo [id number]) on 10/13/2017 8:43:44 AM       Radiology Dg Chest 2 View  Result Date: 10/13/2017 CLINICAL DATA:  Chest pain EXAM: CHEST  2 VIEW COMPARISON:  11/30/2016 FINDINGS: Lungs  are clear.  No pleural effusion or pneumothorax. The heart is normal in size. Mild degenerative changes of the visualized thoracolumbar spine. IMPRESSION: Normal chest radiographs. Electronically Signed   By: Julian Hy M.D.   On: 10/13/2017 11:32    Procedures Procedures (including critical care time)  Medications Ordered in ED Medications  traMADol (ULTRAM) tablet 100 mg (100 mg Oral Given 10/13/17  1131)  clindamycin (CLEOCIN) capsule 300 mg (300 mg Oral Given 10/13/17 1130)     Initial Impression / Assessment and Plan / ED Course  I have reviewed the triage vital signs and the nursing notes.  Pertinent labs & imaging results that were available during my care of the patient were reviewed by me and considered in my medical decision making (see chart for details).      Patient Vitals for the past 24 hrs:  BP Temp Temp src Pulse Resp SpO2 Height Weight  10/13/17 0820 (!) 156/99 98.8 F (37.1 C) Oral (!) 119 20 96 % - -  10/13/17 0819 - - - - - - '5\' 3"'$  (1.6 m) 74.8 kg (165 lb)    12:15 PM Reevaluation with update and discussion. After initial assessment and treatment, an updated evaluation reveals she is comfortable now resting and feels better.  Findings discussed and questions answered. Daleen Bo     Final Clinical Impressions(s) / ED Diagnoses   Final diagnoses:  Dental infection   She presents for evaluation of her concern for nodes in the back, and a dental infection.  Chest x-ray is reassuring.  She does not have any obvious stigmata of recurrent lung cancer.  She has a dental infection secondary to a dental fracture, and this is a recurrent problem.  Doubt abscess, requiring drainage, today.  Nursing Notes Reviewed/ Care Coordinated Applicable Imaging Reviewed Interpretation of Laboratory Data incorporated into ED treatment  The patient appears reasonably screened and/or stabilized for discharge and I doubt any other medical condition or other [facility] requiring further screening, evaluation, or treatment in the ED at this time prior to discharge.  Plan: Home Medications-continue usual home medications and use OTC analgesia if needed.; Home Treatments-warm compress to affected area; return here if the recommended treatment, does not improve the symptoms; Recommended follow up-dentist for extraction ASAP    ED Discharge Orders        Ordered    clindamycin (CLEOCIN)  300 MG capsule  3 times daily     10/13/17 1216    traMADol (ULTRAM) 50 MG tablet  Every 6 hours PRN     10/13/17 1216       Daleen Bo, MD 10/14/17 1000

## 2017-10-13 NOTE — ED Notes (Signed)
edp notified of vitals, instructed pt to follow up with pcp this next week.

## 2017-10-13 NOTE — ED Triage Notes (Signed)
Pt c/o toothache x 1 week.  Left sided facial swelling since last night.  Reports her blood pressure has been elevated and has been voiding a lot during the night.  Pt says aches all over and axillary lymph nodes are tender on both sides.  Pt also c/o chest pain and is tearful.

## 2017-10-27 ENCOUNTER — Other Ambulatory Visit: Payer: Self-pay | Admitting: Physician Assistant

## 2017-11-09 ENCOUNTER — Other Ambulatory Visit: Payer: Self-pay

## 2017-11-09 DIAGNOSIS — F329 Major depressive disorder, single episode, unspecified: Secondary | ICD-10-CM

## 2017-11-09 DIAGNOSIS — F419 Anxiety disorder, unspecified: Principal | ICD-10-CM

## 2017-11-09 MED ORDER — BUPROPION HCL ER (SR) 150 MG PO TB12: ORAL_TABLET | ORAL | 2 refills | Status: DC

## 2017-12-08 ENCOUNTER — Other Ambulatory Visit: Payer: Self-pay | Admitting: Physician Assistant

## 2017-12-13 ENCOUNTER — Other Ambulatory Visit: Payer: Self-pay | Admitting: Physician Assistant

## 2017-12-13 DIAGNOSIS — F329 Major depressive disorder, single episode, unspecified: Secondary | ICD-10-CM

## 2017-12-13 DIAGNOSIS — F419 Anxiety disorder, unspecified: Principal | ICD-10-CM

## 2017-12-13 DIAGNOSIS — F32A Depression, unspecified: Secondary | ICD-10-CM

## 2017-12-13 DIAGNOSIS — E119 Type 2 diabetes mellitus without complications: Secondary | ICD-10-CM

## 2018-02-02 ENCOUNTER — Other Ambulatory Visit: Payer: Self-pay | Admitting: Physician Assistant

## 2018-02-02 DIAGNOSIS — F419 Anxiety disorder, unspecified: Principal | ICD-10-CM

## 2018-02-02 DIAGNOSIS — F329 Major depressive disorder, single episode, unspecified: Secondary | ICD-10-CM

## 2018-02-19 ENCOUNTER — Other Ambulatory Visit: Payer: Self-pay | Admitting: Physician Assistant

## 2018-02-19 DIAGNOSIS — G629 Polyneuropathy, unspecified: Secondary | ICD-10-CM

## 2018-03-11 ENCOUNTER — Encounter: Payer: Self-pay | Admitting: Physician Assistant

## 2018-03-11 ENCOUNTER — Other Ambulatory Visit: Payer: Self-pay

## 2018-03-11 ENCOUNTER — Ambulatory Visit (INDEPENDENT_AMBULATORY_CARE_PROVIDER_SITE_OTHER): Payer: Medicare Other | Admitting: Physician Assistant

## 2018-03-11 VITALS — BP 132/84 | HR 85 | Temp 97.8°F | Resp 16 | Ht 63.0 in | Wt 160.0 lb

## 2018-03-11 DIAGNOSIS — E785 Hyperlipidemia, unspecified: Secondary | ICD-10-CM

## 2018-03-11 DIAGNOSIS — M25512 Pain in left shoulder: Secondary | ICD-10-CM

## 2018-03-11 DIAGNOSIS — M25511 Pain in right shoulder: Secondary | ICD-10-CM

## 2018-03-11 DIAGNOSIS — Z0000 Encounter for general adult medical examination without abnormal findings: Secondary | ICD-10-CM

## 2018-03-11 DIAGNOSIS — I10 Essential (primary) hypertension: Secondary | ICD-10-CM | POA: Diagnosis not present

## 2018-03-11 DIAGNOSIS — E119 Type 2 diabetes mellitus without complications: Secondary | ICD-10-CM | POA: Diagnosis not present

## 2018-03-11 DIAGNOSIS — E2839 Other primary ovarian failure: Secondary | ICD-10-CM

## 2018-03-11 DIAGNOSIS — Z780 Asymptomatic menopausal state: Secondary | ICD-10-CM | POA: Diagnosis not present

## 2018-03-11 DIAGNOSIS — Z23 Encounter for immunization: Secondary | ICD-10-CM | POA: Diagnosis not present

## 2018-03-11 DIAGNOSIS — G8929 Other chronic pain: Secondary | ICD-10-CM | POA: Diagnosis not present

## 2018-03-11 NOTE — Addendum Note (Signed)
Addended by: Vonna Kotyk A on: 03/11/2018 11:54 AM   Modules accepted: Orders

## 2018-03-11 NOTE — Progress Notes (Signed)
Patient ID: EMORI MUMME MRN: [id number], DOB: Nov 10, 1950, 67 y.o. Date of Encounter: 03/11/2018,   Chief Complaint: Annual Medicare Exam / Physical (CPE)  HPI: 67 y.o. y/o female  here for annual Medicare Visit, CPE.    Medical providers that she has seen in the past 2 years: Dr. Ninfa Linden at [facility] orthopedic Dr. Oneida Alar at [facility] Dr. Paulla Dolly at podiatry regarding plantar fasciitis She has been seeing me as her PCP. Mental health/behavioral health-- she had been seeing Dr. Harrington Challenger in the past.  States that she had missed an appointment there so there was an issue with her scheulding f/u there. But says her husband sees Dr. Harrington Challenger and Dr. Harrington Challenger told her to just make an appointment"   Review of Systems: Consitutional: No fever, chills, fatigue, night sweats, lymphadenopathy. No significant/unexplained weight changes. Eyes: No visual changes, eye redness, or discharge. ENT/Mouth: No ear pain, sore throat, nasal drainage, or sinus pain. Cardiovascular: No chest pressure,heaviness, tightness or squeezing, even with exertion. No increased shortness of breath or dyspnea on exertion.No palpitations, edema, orthopnea, PND. Respiratory: No cough, hemoptysis, SOB, or wheezing. Gastrointestinal: No anorexia, dysphagia, reflux, pain, nausea, vomiting, hematemesis, diarrhea, constipation, BRBPR, or melena. Breast: No mass, nodules, bulging, or retraction. No skin changes or inflammation. No nipple discharge. No lymphadenopathy. Genitourinary: No dysuria, hematuria, incontinence, vaginal discharge, pruritis, burning, abnormal bleeding, or pain. Musculoskeletal: --------------------------------She has been experiencing limited range of motion in both shoulders and pain in both shoulders.------------------------------------------------------------- Skin: No rash, pruritis, or concerning lesions. Neurological: No headache, dizziness, syncope, seizures, tremors, memory loss, coordination  problems, or paresthesias. Psychological: ----------------------------------------------------No  hallucinations, SI/HI.---------------------------------------------------------------------------------- Endocrine: ------------Known Diabetes-----------------------------------------------No increased fatigue. No palpitations/rapid heart rate. No significant/unexplained weight change. All other systems were reviewed and are otherwise negative.  Past Medical History:  Diagnosis Date  . Allergic rhinitis   . Allergy   . Anxiety   . Arthritis   . Bipolar 1 disorder ([place])   . Cancer [facility]) 2004   breast   . Carpal tunnel syndrome of left wrist   . Chronic neck pain   . Chronic shoulder pain    L>R  . Depression   . Diabetes mellitus without complication ([place])   . GERD (gastroesophageal reflux disease)   . Headache(784.0)   . History of lung surgery   . Hyperlipidemia   . Hypertension   . Lung cancer ([place])   . Lung nodule 2009  . Pain in both feet   . Personal history of radiation therapy 2004  . PONV (postoperative nausea and vomiting)   . Prediabetes    a1c 6.4 12/16  . Shortness of breath    exertion     Past Surgical History:  Procedure Laterality Date  . BREAST SURGERY  2005   lumpectomy - left  . carpel tunnel Right   . CESAREAN SECTION  1971  . COLONOSCOPY WITH PROPOFOL N/A 07/25/2016   Procedure: COLONOSCOPY WITH PROPOFOL;  Surgeon: Danie Binder, MD;  Location: [facility];  Service: Endoscopy;  Laterality: N/A;  8:15 AM  . EYE SURGERY    . LUNG CANCER SURGERY Right 05/23/2012   reportedly clear  . TONSILLECTOMY    . TUBAL LIGATION      Home Meds:  Outpatient Medications Prior to Visit  Medication Sig Dispense Refill  . albuterol (PROVENTIL HFA;VENTOLIN HFA) 108 (90 Base) MCG/ACT inhaler Inhale 2 puffs into the lungs every 4 (four) hours as needed for wheezing or shortness of breath. 1 Inhaler 0  .  aspirin 81 MG tablet Take 1 tablet (81 mg total) by mouth  daily. 30 tablet 11  . blood glucose meter kit and supplies KIT Check up to twice a day before meals 1 each 6  . buPROPion (WELLBUTRIN SR) 150 MG 12 hr tablet TAKE ONCE DAILY FOR 5 DAYS THEN INCREASE TO ONE TWICE A DAY 180 tablet 0  . cetirizine (ZYRTEC) 10 MG tablet Take 1 tablet (10 mg total) by mouth daily. 30 tablet 11  . FLUoxetine (PROZAC) 40 MG capsule TAKE 1 CAPSULE BY MOUTH EVERY DAY 90 capsule 1  . fluticasone (FLONASE) 50 MCG/ACT nasal spray Place 2 sprays into both nostrils daily. 16 g 6  . gabapentin (NEURONTIN) 800 MG tablet TAKE 1 TABLET (800 MG TOTAL) BY MOUTH 2 (TWO) TIMES DAILY. 180 tablet 1  . glucose blood test strip Check up to twice a day 100 each 1  . ibuprofen (ADVIL,MOTRIN) 800 MG tablet Take 1 tablet (800 mg total) by mouth every 8 (eight) hours as needed. 90 tablet 3  . Lancets (ONETOUCH ULTRASOFT) lancets Use as instructed 100 each 1  . losartan (COZAAR) 50 MG tablet Take 1 tablet (50 mg total) by mouth daily. 90 tablet 3  . meloxicam (MOBIC) 7.5 MG tablet TAKE 1 TABLET (7.5 MG TOTAL) BY MOUTH DAILY. TAKE WITH FOOD. 30 tablet 2  . metFORMIN (GLUCOPHAGE) 500 MG tablet TAKE 1 TABLET (500 MG TOTAL) 2 (TWO) TIMES DAILY WITH A MEAL BY MOUTH. 180 tablet 1  . NON FORMULARY [facility]  Peripheral Neuropathy Cream- Bupivacaine 1%, Doxepin 3%, Gabapentin 6%, Pentoxifylline 3%, Topiramate 1% Apply 1-2 grams to affected area 3-4 times daily Qty. 120 gm 3 refills    . pantoprazole (PROTONIX) 40 MG tablet TAKE 1 TABLET BY MOUTH EVERY DAY 90 tablet 2  . rosuvastatin (CRESTOR) 40 MG tablet Take 1 tablet (40 mg total) by mouth daily. 90 tablet 1  . traMADol (ULTRAM) 50 MG tablet Take 1 tablet (50 mg total) by mouth every 6 (six) hours as needed. 15 tablet 0  . clindamycin (CLEOCIN) 300 MG capsule Take 1 capsule (300 mg total) by mouth 3 (three) times daily. X 7 days 21 capsule 0  . HYDROcodone-acetaminophen (NORCO/VICODIN) 5-325 MG tablet Take 1 tablet by mouth every 4 (four)  hours as needed. (Patient not taking: Reported on 10/13/2017) 12 tablet 0  . pantoprazole (PROTONIX) 40 MG tablet TAKE 1 TABLET BY MOUTH EVERY DAY 90 tablet 2  . polyethylene glycol (MIRALAX / GLYCOLAX) packet Take 17 g by mouth daily.     No facility-administered medications prior to visit.     Allergies:  Allergies  Allergen Reactions  . Bee Venom Anaphylaxis  . Penicillins Rash    Has patient had a PCN reaction causing immediate rash, facial/tongue/throat swelling, SOB or lightheadedness with hypotension: Yes Has patient had a PCN reaction causing severe rash involving mucus membranes or skin necrosis: No Has patient had a PCN reaction that required hospitalization No Has patient had a PCN reaction occurring within the last 10 years: No If all of the above answers are "NO", then may proceed with Cephalosporin use.      Social History   Socioeconomic History  . Marital status: Married    Spouse name: Eddie Dibbles  . Number of children: 1  . Years of education: 8  . Highest education level: Not on file  Occupational History  . Occupation: retired    Comment: Location manager  Social Needs  . Financial  resource strain: Not on file  . Food insecurity:    Worry: Not on file    Inability: Not on file  . Transportation needs:    Medical: Not on file    Non-medical: Not on file  Tobacco Use  . Smoking status: Former Smoker    Packs/day: 1.00    Years: 10.00    Pack years: 10.00    Types: Cigarettes    Last attempt to quit: 08/14/1974    Years since quitting: 43.6  . Smokeless tobacco: Never Used  Substance and Sexual Activity  . Alcohol use: Yes    Comment: occasionally   . Drug use: Yes    Types: Marijuana  . Sexual activity: Never  Lifestyle  . Physical activity:    Days per week: Not on file    Minutes per session: Not on file  . Stress: Not on file  Relationships  . Social connections:    Talks on phone: Not on file    Gets together: Not on file    Attends  religious service: Not on file    Active member of club or organization: Not on file    Attends meetings of clubs or organizations: Not on file    Relationship status: Not on file  . Intimate partner violence:    Fear of current or ex partner: Not on file    Emotionally abused: Not on file    Physically abused: Not on file    Forced sexual activity: Not on file  Other Topics Concern  . Not on file  Social History Narrative   Married.    Both pt and her husband have bipolar. She says he "Gets moody and leaves for a while" then comes back.    "Not very supportive."   Caffeine use- 2 cups/day    Family History  Problem Relation Age of Onset  . Heart disease Mother        CHF  . Physical abuse Mother   . Depression Mother   . Cancer Sister   . ADD / ADHD Sister   . Alcohol abuse Father   . Alcohol abuse Brother   . Mental illness Brother   . Cancer Sister   . Heart disease Sister   . Physical abuse Sister   . OCD Sister   . Mental illness Sister   . Anxiety disorder Neg Hx   . Bipolar disorder Neg Hx   . Dementia Neg Hx   . Drug abuse Neg Hx   . Paranoid behavior Neg Hx   . Schizophrenia Neg Hx   . Seizures Neg Hx   . Sexual abuse Neg Hx     Physical Exam: Blood pressure 132/84, pulse 85, temperature 97.8 F (36.6 C), temperature source Oral, resp. rate 16, height '5\' 3"'$  (1.6 m), weight 72.6 kg (160 lb), SpO2 97 %., Body mass index is 28.34 kg/m. General: Well developed, well nourished WF. Appears in no acute distress. HEENT: Normocephalic, atraumatic. Conjunctiva pink, sclera non-icteric. Pupils 2 mm constricting to 1 mm, round, regular, and equally reactive to light and accomodation. EOMI. Internal auditory canal clear. TMs with good cone of light and without pathology. Nasal mucosa pink. Nares are without discharge. No sinus tenderness. Oral mucosa pink.  Neck: Supple. Trachea midline. No thyromegaly. Full ROM. No lymphadenopathy.No Carotid Bruits. Lungs: Clear to  auscultation bilaterally without wheezes, rales, or rhonchi. Breathing is of normal effort and unlabored. Cardiovascular: RRR with S1 S2. No murmurs, rubs, or gallops. Distal  pulses 2+ symmetrically. No carotid or abdominal bruits. Abdomen: Soft, non-tender, non-distended with normoactive bowel sounds. No hepatosplenomegaly or masses. No rebound/guarding. No CVA tenderness. No hernias.  Musculoskeletal: Full range of motion and 5/5 strength throughout.  Skin: Warm and moist without erythema, ecchymosis, wounds, or rash. Neuro: A+Ox3. CN II-XII grossly intact. Moves all extremities spontaneously. Full sensation throughout. Normal gait.  Psych:  Responds to questions appropriately with a normal affect.   Assessment/Plan:  67 y.o. y/o female here for CPE   1. Medicare annual wellness visit, subsequent See Below for Further Documentation for this - MM DIGITAL SCREENING BILATERAL; Future - DG BONE DENSITY (DXA); Future  2. Encounter for preventive health examination A. Screening Labs: She is not fasting today.  However she states that she lives nearby and can easily return fasting for labs tomorrow morning. - CBC with Differential/Platelet; Future - COMPLETE METABOLIC PANEL WITH GFR; Future - TSH; Future - Lipid panel; Future  B. Pap: >80 y/o. No further pap indicated. She defers pelvic exam.   C. Screening Mammogram: Reviewed epic.  Appears that last mammogram was 10/2016 at [facility].  Will order follow-up mammogram for [facility]. - MM DIGITAL SCREENING BILATERAL; Future  D. DEXA/BMD:  Reviewed epic and also discussed with patient.  Appears that she has never had DEXA/bone density.  She is just now age 67 so probably has not had one but now is due.  Agreeable for me to schedule.  We will schedule bone density also at [facility] where she has her mammogram. - DG BONE DENSITY (DXA); Future  E. Colorectal Cancer Screening: She had colonoscopy 07/25/2016 by Dr. Oneida Alar with [facility].  She states that "it was normal and clear" and reports that she was told to repeat 10 years.  F. Immunizations:  Influenza:------------------N/A Tetanus:----------------Not covered by Medicare Pneumococcal:------- she has received no pneumonia vaccine in the past.   ------------------She is now over 65 so we will give Prevnar 13 now.  Discussed with her and she is agreeable.  Give Prevnar 13 today-- 03/11/2018.  Give Pneumovax 23 in 6 to 12 months. Shingrix:--------- discussed today.  She is to check her insurance coverage regarding cost and coverage and if she wants to receive Shingrix will get this at pharmacy.    3. Essential hypertension Blood Pressure is controlled/at goal.  Continue current medication.  Check labs to monitor. - COMPLETE METABOLIC PANEL WITH GFR; Future  4. Controlled type 2 diabetes mellitus without complication, without long-term current use of insulin ([place]) She has diabetes.  She is overdue to check A1c c-Met and microalbumin.  She will return fasting tomorrow for all of these labs. They I discussed that she should be coming in for routine visit and labs every 3 months.  She is scheduling next visit for 3 months. - Hemoglobin A1c; Future - Microalbumin, urine; Future - COMPLETE METABOLIC PANEL WITH GFR; Future  5. Hyperlipidemia, unspecified hyperlipidemia type She is on statin.  She is not fasting today.  Will return fasting tomorrow morning for labs. - COMPLETE METABOLIC PANEL WITH GFR; Future - Lipid panel; Future  6. Chronic pain of both shoulders She has seen Dr. Ninfa Linden at [facility] in the past.  She states that he has done injections into each shoulder in the past.  She reports that it has been over 1 year since this has been performed.  She reports that she is having limited range of motion in each of her shoulders and is having pain in  both shoulders.  Will refer back to Dr. Ninfa Linden for further management. NOTE THAT SHE DOES ASK FOR PAIN  MED TO USE----I TOLD HER TO F/U WITH ORTHOPEDICS REGARDING MANAGEMENT OF THESE PROBLEMS---GAVE HER NO RX AT ALL--------------------- - AMB referral to orthopedics  7. Postmenopausal Reviewed epic and also discussed with patient.  Appears that she has never had DEXA/bone density.  She is just now age 40 so probably has not had one but now is due.  Agreeable for me to schedule.  We will schedule bone density also at [facility] where she has her mammogram. - DG BONE DENSITY (DXA); Future - DG BONE DENSITY (DXA); Future  8. Estrogen deficiency Reviewed epic and also discussed with patient.  Appears that she has never had DEXA/bone density.  She is just now age 74 so probably has not had one but now is due.  Agreeable for me to schedule.  We will schedule bone density also at [facility] where she has her mammogram. - DG BONE DENSITY (DXA); Future - DG BONE DENSITY (DXA); Future     Subjective:   Patient presents for Medicare Annual/Subsequent preventive examination.   Review Past Medical/Family/Social: All of this is entered in epic and reviewed today.  Her husband also has bipolar disorder and also is managed by Dr. Harrington Challenger at behavioral health.  Risk Factors  Current exercise habits: She does no formal or routine exercise.  Active around the house. Dietary issues discussed: She has been educated regarding low carbohydrate low cholesterol low-sodium diet.  She is somewhat compliant with this.  Cardiac risk factors: Age, diabetes, hypertension, hyperlipidemia.  Depression Screen  (Note: if answer to either of the following is "Yes", a more complete depression screening is indicated)  Over the past two weeks, have you felt down, depressed or hopeless? No Over the past two weeks, have you felt little interest or pleasure in doing things? No Have you lost interest or pleasure in daily life? No Do you often feel hopeless? No Do you cry easily over simple problems? No  She does answer no to  these today.  However it is noted that she does have a history of bipolar disorder and has been managed by Dr. Harrington Challenger at behavioral health.  Activities of Daily Living  In your present state of health, do you have any difficulty performing the following activities?:  Driving? No  Managing money? No  Feeding yourself? No  Getting from bed to chair? No  Climbing a flight of stairs? No  Preparing food and eating?: No  Bathing or showering? No  Getting dressed: ------- remarks that she sometimes has difficulty getting dressed by herself.  She states this is secondary to her limited range of motion in her shoulders and being able to get her shirt on and off.  Sometimes has to get her husband to help with this.  Today I am referring her back to orthopedics for further management of her shoulder issues. Getting to the toilet? No  Using the toilet:No  Moving around from place to place: No  In the past year have you fallen or had a near fall?:  She had one fall February 2019. Are you sexually active? No  Do you have more than one partner? No   Hearing Difficulties: No  Do you often ask people to speak up or repeat themselves? No  Do you experience ringing or noises in your ears? No Do you have difficulty understanding soft or whispered voices? No  Do  you feel that you have a problem with memory?  She answered yes that she does feel like she has some problems with her memory at times.  We then did a Mini-Mental exam which is normal.  Her total score was 29.  She states that she sometimes will walk in a room and forget what she went and therefore.  Discussed that this is very common and considered within normal limits.  She has noticed no other significant memory issues. Do you often misplace items? No  Do you feel safe at home? Yes  Cognitive Testing  Alert? Yes Normal Appearance?Yes  Oriented to person? Yes Place? Yes  Time? Yes  Recall of three objects? Yes  Can perform simple calculations? Yes   Displays appropriate judgment?Yes  Can read the correct time from a watch face?Yes   List the Names of Other Physician/Practitioners you currently use:  Medical providers that she has seen in the past 2 years: Dr. Ninfa Linden at [facility] orthopedic Dr. Oneida Alar at [facility] Dr. Paulla Dolly at podiatry regarding plantar fasciitis She has been seeing me as her PCP. Mental health/behavioral health-- she had been seeing Dr. Harrington Challenger in the past.  States that she had missed an appointment there so there was an issue with her scheulding f/u there. But says her husband sees Dr. Harrington Challenger and Dr. Harrington Challenger told her to just make an appointment"    Indicate any recent Medical Services you may have received from other than [facility] providers in the past year (date may be approximate).  Dr. Ninfa Linden at [facility] orthopedic Dr. Oneida Alar at [facility] Dr. Paulla Dolly at podiatry regarding plantar fasciitis She has been seeing me as her PCP. Mental health/behavioral health-- she had been seeing Dr. Harrington Challenger in the past.  States that she had missed an appointment there so there was an issue with her scheulding f/u there. But says her husband sees Dr. Harrington Challenger and Dr. Harrington Challenger told her to just make an appointment"  Screening Tests / Date-------------------This information is documented above. Colonoscopy                     Zostavax  Mammogram  Influenza Vaccine  Tetanus/tdap    Assessment:    Annual wellness medicare exam   Plan:    During the course of the visit the patient was educated and counseled about appropriate screening and preventive services including:  Screening mammography  Colorectal cancer screening  Shingles vaccine.   Medicare Attestation  I have personally reviewed:  The patient's medical and social history  Their current medications and supplements  The patient's functional ability including ADLs,fall risks, home safety risks, cognitive, and hearing and visual impairment  Diet and  physical activities  The patient's weight, height, BMI have been recorded in the chart. I have made referrals, counseling, and provided education to the patient based on review of the above and I have provided the patient with a written personalized care plan for preventive services.       Signed, [address] [place], [place], [facility] 03/11/2018 10:58 AM

## 2018-03-12 ENCOUNTER — Other Ambulatory Visit: Payer: Medicare Other

## 2018-03-12 DIAGNOSIS — E785 Hyperlipidemia, unspecified: Secondary | ICD-10-CM | POA: Diagnosis not present

## 2018-03-12 DIAGNOSIS — I10 Essential (primary) hypertension: Secondary | ICD-10-CM

## 2018-03-12 DIAGNOSIS — Z0000 Encounter for general adult medical examination without abnormal findings: Secondary | ICD-10-CM

## 2018-03-12 DIAGNOSIS — E119 Type 2 diabetes mellitus without complications: Secondary | ICD-10-CM | POA: Diagnosis not present

## 2018-03-13 ENCOUNTER — Other Ambulatory Visit: Payer: Self-pay

## 2018-03-13 LAB — CBC WITH DIFFERENTIAL/PLATELET
BASOS ABS: 28 {cells}/uL (ref 0–200)
BASOS PCT: 0.4 %
EOS ABS: 71 {cells}/uL (ref 15–500)
Eosinophils Relative: 1 %
HCT: 39.4 % (ref 35.0–45.0)
Hemoglobin: 12.9 g/dL (ref 11.7–15.5)
Lymphs Abs: 1548 cells/uL (ref 850–3900)
MCH: 29.5 pg (ref 27.0–33.0)
MCHC: 32.7 g/dL (ref 32.0–36.0)
MCV: 90.2 fL (ref 80.0–100.0)
MONOS PCT: 9.6 %
MPV: 10.8 fL (ref 7.5–12.5)
Neutro Abs: 4771 cells/uL (ref 1500–7800)
Neutrophils Relative %: 67.2 %
PLATELETS: 227 10*3/uL (ref 140–400)
RBC: 4.37 10*6/uL (ref 3.80–5.10)
RDW: 12.1 % (ref 11.0–15.0)
TOTAL LYMPHOCYTE: 21.8 %
WBC mixed population: 682 cells/uL (ref 200–950)
WBC: 7.1 10*3/uL (ref 3.8–10.8)

## 2018-03-13 LAB — COMPLETE METABOLIC PANEL WITH GFR
AG RATIO: 2.1 (calc) (ref 1.0–2.5)
ALT: 17 U/L (ref 6–29)
AST: 20 U/L (ref 10–35)
Albumin: 4.8 g/dL (ref 3.6–5.1)
Alkaline phosphatase (APISO): 65 U/L (ref 33–130)
BILIRUBIN TOTAL: 0.5 mg/dL (ref 0.2–1.2)
BUN/Creatinine Ratio: 17 (calc) (ref 6–22)
BUN: 21 mg/dL (ref 7–25)
CALCIUM: 9.7 mg/dL (ref 8.6–10.4)
CO2: 27 mmol/L (ref 20–32)
Chloride: 105 mmol/L (ref 98–110)
Creat: 1.25 mg/dL — ABNORMAL HIGH (ref 0.50–0.99)
GFR, EST NON AFRICAN AMERICAN: 45 mL/min/{1.73_m2} — AB (ref >=60)
GFR, Est African American: 52 mL/min/{1.73_m2} — ABNORMAL LOW (ref >=60)
GLOBULIN: 2.3 g/dL (ref 1.9–3.7)
Glucose, Bld: 98 mg/dL (ref 65–99)
POTASSIUM: 4.4 mmol/L (ref 3.5–5.3)
Sodium: 140 mmol/L (ref 135–146)
Total Protein: 7.1 g/dL (ref 6.1–8.1)

## 2018-03-13 LAB — HEMOGLOBIN A1C
EAG (MMOL/L): 6.6 (calc)
Hgb A1c MFr Bld: 5.8 % of total Hgb — ABNORMAL HIGH (ref <5.7)
MEAN PLASMA GLUCOSE: 120 (calc)

## 2018-03-13 LAB — LIPID PANEL
CHOL/HDL RATIO: 2.4 (calc) (ref <5.0)
Cholesterol: 163 mg/dL (ref <200)
HDL: 68 mg/dL (ref >50)
LDL CHOLESTEROL (CALC): 74 mg/dL
NON-HDL CHOLESTEROL (CALC): 95 mg/dL (ref <130)
TRIGLYCERIDES: 128 mg/dL (ref <150)

## 2018-03-13 LAB — MICROALBUMIN, URINE: Microalb, Ur: 15.8 mg/dL

## 2018-03-13 LAB — TSH: TSH: 3.14 m[IU]/L (ref 0.40–4.50)

## 2018-03-29 ENCOUNTER — Other Ambulatory Visit: Payer: Self-pay | Admitting: Physician Assistant

## 2018-04-01 ENCOUNTER — Ambulatory Visit (INDEPENDENT_AMBULATORY_CARE_PROVIDER_SITE_OTHER): Payer: Medicare Other | Admitting: Orthopaedic Surgery

## 2018-04-01 ENCOUNTER — Ambulatory Visit (INDEPENDENT_AMBULATORY_CARE_PROVIDER_SITE_OTHER): Payer: Medicare Other

## 2018-04-01 ENCOUNTER — Encounter (INDEPENDENT_AMBULATORY_CARE_PROVIDER_SITE_OTHER): Payer: Self-pay | Admitting: Orthopaedic Surgery

## 2018-04-01 DIAGNOSIS — M25512 Pain in left shoulder: Secondary | ICD-10-CM

## 2018-04-01 DIAGNOSIS — G8929 Other chronic pain: Secondary | ICD-10-CM

## 2018-04-01 DIAGNOSIS — M19011 Primary osteoarthritis, right shoulder: Secondary | ICD-10-CM | POA: Insufficient documentation

## 2018-04-01 DIAGNOSIS — M19012 Primary osteoarthritis, left shoulder: Secondary | ICD-10-CM

## 2018-04-01 DIAGNOSIS — M25511 Pain in right shoulder: Secondary | ICD-10-CM | POA: Diagnosis not present

## 2018-04-01 MED ORDER — ACETAMINOPHEN-CODEINE #3 300-30 MG PO TABS: 1.0000 | ORAL_TABLET | Freq: Three times a day (TID) | ORAL | 0 refills | Status: DC | PRN

## 2018-04-01 NOTE — Progress Notes (Signed)
The patient is someone was seen before.  She has known and well-documented glenohumeral arthritis that is severe involving both shoulders.  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  Reaching down and reaching overhead is become very painful and her shoulders become very stiff.  In December 2017 we had Dr. Ernestina Patches provide intra-articular steroid injections in both shoulder glenohumeral joints.  She says this was not really helpful at all.  On exam both shoulders are significantly stiff and she has a lot of guarding with the attending of her shoulders the range of motion.  There is grinding at the glenohumeral joint on both shoulders.  3 views of the shoulder shoulders are obtained including AP outlet and axillary views.  These both show significant glenohumeral arthritis of both shoulders.  This point no other option would be talking with a shoulder specialist about shoulder replacement surgery.  She is interested in considering this.  We will set her up an appointment to see my partner Dr. Marlou Sa.

## 2018-04-18 ENCOUNTER — Ambulatory Visit (INDEPENDENT_AMBULATORY_CARE_PROVIDER_SITE_OTHER): Payer: Medicare Other | Admitting: Orthopedic Surgery

## 2018-04-18 ENCOUNTER — Encounter (INDEPENDENT_AMBULATORY_CARE_PROVIDER_SITE_OTHER): Payer: Self-pay | Admitting: Orthopedic Surgery

## 2018-04-18 DIAGNOSIS — M19011 Primary osteoarthritis, right shoulder: Secondary | ICD-10-CM | POA: Diagnosis not present

## 2018-04-18 DIAGNOSIS — M19012 Primary osteoarthritis, left shoulder: Secondary | ICD-10-CM | POA: Diagnosis not present

## 2018-04-18 MED ORDER — ACETAMINOPHEN-CODEINE #3 300-30 MG PO TABS: 1.0000 | ORAL_TABLET | Freq: Three times a day (TID) | ORAL | 0 refills | Status: DC | PRN

## 2018-04-19 ENCOUNTER — Encounter (INDEPENDENT_AMBULATORY_CARE_PROVIDER_SITE_OTHER): Payer: Self-pay | Admitting: Orthopedic Surgery

## 2018-04-19 NOTE — Progress Notes (Signed)
Office Visit Note   Patient: MONIFAH FREEHLING           Date of Birth: 25-Dec-1950           MRN: [id number] Visit Date: 04/18/2018 Requested by: Orlena Sheldon, PA-C [address], [place] [id number] PCP: Rennis Golden  Subjective: Chief Complaint  Patient presents with  . Right Shoulder - Pain  . Left Shoulder - Pain    HPI: Patient presents for evaluation of bilateral shoulder pain.  Patient has had bilateral shoulder pain for years.  It hurts for her to cross her arms.  Hurts for her to do any type of overhead motion.  She takes Tylenol 3 with a little bit of relief.  Injections have not been helpful.  She states her diabetes has been under good control.  The pain does wake her from sleep at night.  She does not have any stairs but does have a husband at home.  She is not a smoker.  No prior surgery on either shoulder.  She does not use a walker and thus does not use the shoulders for weightbearing.              ROS: All systems reviewed are negative as they relate to the chief complaint within the history of present illness.  Patient denies  fevers or chills.   Assessment & Plan: Visit Diagnoses:  1. Primary osteoarthritis, left shoulder   2. Primary osteoarthritis, right shoulder     Plan: Impression is bilateral shoulder arthritis with diminished range of motion below 90 degrees of forward flexion and abduction.  Rotator cuff strength does appear to be intact.  Due to her age she may be a candidate for a modular total shoulder replacement.  Plan is bilateral CT scans for preoperative glenoid bone stock.  I will see her back after those studies.  Follow-Up Instructions: No follow-ups on file.   Orders:  Orders Placed This Encounter  Procedures  . CT SHOULDER RIGHT WO CONTRAST  . CT SHOULDER LEFT WO CONTRAST   Meds ordered this encounter  Medications  . acetaminophen-codeine (TYLENOL #3) 300-30 MG tablet    Sig: Take 1 tablet by mouth every 8 (eight) hours  as needed for moderate pain.    Dispense:  40 tablet    Refill:  0      Procedures: No procedures performed   Clinical Data: No additional findings.  Objective: Vital Signs: There were no vitals taken for this visit.  Physical Exam:   Constitutional: Patient appears well-developed HEENT:  Head: Normocephalic Eyes:EOM are normal Neck: Normal range of motion Cardiovascular: Normal rate Pulmonary/chest: Effort normal Neurologic: Patient is alert Skin: Skin is warm Psychiatric: Patient has normal mood and affect    Ortho Exam: Ortho exam demonstrates intact motor or sensory function to her hands although she does have complaints consistent with bilateral carpal tunnel syndrome.  Negative Tinel's cubital tunnel in either elbow.  Radial pulse intact bilaterally.  Deltoid fires bilaterally.  She has about 25 degrees of passive external rotation at 15 degrees of abduction both arms.  Forward flexion and abduction passively both limited to below 90 degrees in both shoulders.  Rotator cuff strength is pretty good to subscap supraspinatus and infraspinatus testing.  Specialty Comments:  No specialty comments available.  Imaging: No results found.   PMFS History: Patient Active Problem List   Diagnosis Date Noted  . Primary osteoarthritis, left shoulder 04/01/2018  .  Primary osteoarthritis, right shoulder 04/01/2018  . Essential hypertension 01/22/2017  . Controlled type 2 diabetes mellitus without complication, without long-term current use of insulin ([place]) 11/23/2016  . Chronic pain of both shoulders 07/24/2016  . Prediabetes   . Anxiety   . Bipolar 1 disorder ([place])   . Depression   . Chronic back pain 11/07/2012  . Insomnia secondary to depression with anxiety 09/23/2012  . Unspecified vitamin D deficiency 09/23/2012  . COPD, mild ([place]) 07/01/2012  . Acute bronchitis 06/08/2012  . Adenocarcinoma of lung, stage 1 ([place]) 05/23/2012  . RECTAL BLEEDING 04/27/2010  .  HEMORRHOIDS 03/11/2009  . ADENOCARCINOMA, BREAST 12/15/2008  . FIBROIDS, UTERUS 12/15/2008  . HYPERCHOLESTEROLEMIA 12/15/2008  . Hyperlipidemia 12/15/2008  . Anxiety and depression 12/15/2008  . GASTROESOPHAGEAL REFLUX DISEASE 12/15/2008  . GASTRITIS 12/15/2008  . CONSTIPATION 12/15/2008  . FLATULENCE ERUCTATION AND GAS PAIN 12/15/2008  . ABDOMINAL PAIN 12/15/2008  . RECTAL BLEEDING, HX OF 12/15/2008   Past Medical History:  Diagnosis Date  . Allergic rhinitis   . Allergy   . Anxiety   . Arthritis   . Bipolar 1 disorder ([place])   . Cancer [facility]) 2004   breast   . Carpal tunnel syndrome of left wrist   . Chronic neck pain   . Chronic shoulder pain    L>R  . Depression   . Diabetes mellitus without complication ([place])   . GERD (gastroesophageal reflux disease)   . Headache(784.0)   . History of lung surgery   . Hyperlipidemia   . Hypertension   . Lung cancer ([place])   . Lung nodule 2009  . Pain in both feet   . Personal history of radiation therapy 2004  . PONV (postoperative nausea and vomiting)   . Prediabetes    a1c 6.4 12/16  . Shortness of breath    exertion    Family History  Problem Relation Age of Onset  . Heart disease Mother        CHF  . Physical abuse Mother   . Depression Mother   . Cancer Sister   . ADD / ADHD Sister   . Alcohol abuse Father   . Alcohol abuse Brother   . Mental illness Brother   . Cancer Sister   . Heart disease Sister   . Physical abuse Sister   . OCD Sister   . Mental illness Sister   . Anxiety disorder Neg Hx   . Bipolar disorder Neg Hx   . Dementia Neg Hx   . Drug abuse Neg Hx   . Paranoid behavior Neg Hx   . Schizophrenia Neg Hx   . Seizures Neg Hx   . Sexual abuse Neg Hx     Past Surgical History:  Procedure Laterality Date  . BREAST SURGERY  2005   lumpectomy - left  . carpel tunnel Right   . CESAREAN SECTION  1971  . COLONOSCOPY WITH PROPOFOL N/A 07/25/2016   Procedure: COLONOSCOPY WITH PROPOFOL;  Surgeon: Danie Binder, MD;  [facility];  Service: Endoscopy;  Laterality: N/A;  8:15 AM  . EYE SURGERY    . LUNG CANCER SURGERY Right 05/23/2012   reportedly clear  . TONSILLECTOMY    . TUBAL LIGATION     Social History   Occupational History  . Occupation: retired    Comment: Location manager  Tobacco Use  . Smoking status: Former Smoker    Packs/day: 1.00    Years: 10.00  Pack years: 10.00    Types: Cigarettes    Last attempt to quit: 08/14/1974    Years since quitting: 43.7  . Smokeless tobacco: Never Used  Substance and Sexual Activity  . Alcohol use: Yes    Comment: occasionally   . Drug use: Yes    Types: Marijuana  . Sexual activity: Never

## 2018-04-25 ENCOUNTER — Other Ambulatory Visit: Payer: Self-pay | Admitting: Physician Assistant

## 2018-04-25 DIAGNOSIS — F329 Major depressive disorder, single episode, unspecified: Secondary | ICD-10-CM

## 2018-04-25 DIAGNOSIS — F419 Anxiety disorder, unspecified: Principal | ICD-10-CM

## 2018-04-26 ENCOUNTER — Ambulatory Visit
Admission: RE | Admit: 2018-04-26 | Discharge: 2018-04-26 | Disposition: A | Discharge status: Home / Self Care | Payer: Medicare Other | Source: Ambulatory Visit | Attending: Orthopedic Surgery | Admitting: Orthopedic Surgery

## 2018-04-26 DIAGNOSIS — M19012 Primary osteoarthritis, left shoulder: Secondary | ICD-10-CM | POA: Diagnosis not present

## 2018-04-26 DIAGNOSIS — M19011 Primary osteoarthritis, right shoulder: Secondary | ICD-10-CM

## 2018-05-01 ENCOUNTER — Encounter (INDEPENDENT_AMBULATORY_CARE_PROVIDER_SITE_OTHER): Payer: Self-pay | Admitting: Orthopedic Surgery

## 2018-05-01 ENCOUNTER — Ambulatory Visit (INDEPENDENT_AMBULATORY_CARE_PROVIDER_SITE_OTHER): Payer: Medicare Other | Admitting: Orthopedic Surgery

## 2018-05-01 DIAGNOSIS — M19011 Primary osteoarthritis, right shoulder: Secondary | ICD-10-CM

## 2018-05-01 DIAGNOSIS — M19012 Primary osteoarthritis, left shoulder: Secondary | ICD-10-CM | POA: Diagnosis not present

## 2018-05-01 MED ORDER — ACETAMINOPHEN-CODEINE #3 300-30 MG PO TABS: 1.0000 | ORAL_TABLET | Freq: Three times a day (TID) | ORAL | 0 refills | Status: DC | PRN

## 2018-05-01 NOTE — Progress Notes (Signed)
Office Visit Note   Patient: Deborah Mullins           Date of Birth: [birth year]           MRN: [id number] Visit Date: 05/01/2018 Requested by: Orlena Sheldon, PA-C [address], [place] [id number] PCP: Rennis Golden  Subjective: Chief Complaint  Patient presents with  . Right Shoulder - Follow-up  . Left Shoulder - Follow-up    HPI: Patient presents for evaluation of CT scans.  She has bilateral end-stage shoulder arthritis.  Left is worse than right.  She does have a history of breast cancer and lung resection in the past.  CT scan does show some coronary artery atherosclerosis.  She does not report any discrete chest pain but she does describe some quirky "abdominal" pain which may be chest related.  She is having significant bilateral shoulder pain.  She is only an occasional smoker.              ROS: All systems reviewed are negative as they relate to the chief complaint within the history of present illness.  Patient denies  fevers or chills.   Assessment & Plan: Visit Diagnoses:  1. Primary osteoarthritis, left shoulder   2. Primary osteoarthritis, right shoulder     Plan: Impression is bilateral shoulder arthritis.  Left is worse than right.  She has a little bit of narrowing of the acromiohumeral distance on that left-hand side but the rotator cuff on CT scan appears to be intact with no atrophy.  This will need to be evaluated at the time of surgery when deciding upon reverse shoulder replacement versus primary shoulder replacement.  The risk and benefits of surgical intervention are discussed including but not limited to infection nerve vessel damage incomplete healing potential for revision surgery as well as instability.  Patient understands risk benefits and wishes to proceed.  All questions answered.  Follow-Up Instructions: No follow-ups on file.   Orders:  No orders of the defined types were placed in this encounter.  Meds ordered this encounter    Medications  . acetaminophen-codeine (TYLENOL #3) 300-30 MG tablet    Sig: Take 1 tablet by mouth every 8 (eight) hours as needed for moderate pain.    Dispense:  40 tablet    Refill:  0      Procedures: No procedures performed   Clinical Data: No additional findings.  Objective: Vital Signs: There were no vitals taken for this visit.  Physical Exam:   Constitutional: Patient appears well-developed HEENT:  Head: Normocephalic Eyes:EOM are normal Neck: Normal range of motion Cardiovascular: Normal rate Pulmonary/chest: Effort normal Neurologic: Patient is alert Skin: Skin is warm Psychiatric: Patient has normal mood and affect    Ortho Exam: Orthopedic exam demonstrates full active and passive range of motion of the elbows and wrists.  Deltoid functional bilaterally.  Both shoulders have limited forward flexion and abduction to less than 90.  She does have some coarseness and grinding.  Specialty Comments:  No specialty comments available.  Imaging: No results found.   PMFS History: Patient Active Problem List   Diagnosis Date Noted  . Primary osteoarthritis, left shoulder 04/01/2018  . Primary osteoarthritis, right shoulder 04/01/2018  . Essential hypertension 01/22/2017  . Controlled type 2 diabetes mellitus without complication, without long-term current use of insulin ([place]) 11/23/2016  . Chronic pain of both shoulders 07/24/2016  . Prediabetes   . Anxiety   . Bipolar 1  disorder ([place])   . Depression   . Chronic back pain 11/07/2012  . Insomnia secondary to depression with anxiety 09/23/2012  . Unspecified vitamin D deficiency 09/23/2012  . COPD, mild ([place]) 07/01/2012  . Acute bronchitis 06/08/2012  . Adenocarcinoma of lung, stage 1 ([place]) 05/23/2012  . RECTAL BLEEDING 04/27/2010  . HEMORRHOIDS 03/11/2009  . ADENOCARCINOMA, BREAST 12/15/2008  . FIBROIDS, UTERUS 12/15/2008  . HYPERCHOLESTEROLEMIA 12/15/2008  . Hyperlipidemia 12/15/2008  . Anxiety and  depression 12/15/2008  . GASTROESOPHAGEAL REFLUX DISEASE 12/15/2008  . GASTRITIS 12/15/2008  . CONSTIPATION 12/15/2008  . FLATULENCE ERUCTATION AND GAS PAIN 12/15/2008  . ABDOMINAL PAIN 12/15/2008  . RECTAL BLEEDING, HX OF 12/15/2008   Past Medical History:  Diagnosis Date  . Allergic rhinitis   . Allergy   . Anxiety   . Arthritis   . Bipolar 1 disorder ([place])   . Cancer [facility]) 2004   breast   . Carpal tunnel syndrome of left wrist   . Chronic neck pain   . Chronic shoulder pain    L>R  . Depression   . Diabetes mellitus without complication ([place])   . GERD (gastroesophageal reflux disease)   . Headache(784.0)   . History of lung surgery   . Hyperlipidemia   . Hypertension   . Lung cancer ([place])   . Lung nodule 2009  . Pain in both feet   . Personal history of radiation therapy 2004  . PONV (postoperative nausea and vomiting)   . Prediabetes    a1c 6.4 12/16  . Shortness of breath    exertion    Family History  Problem Relation Age of Onset  . Heart disease Mother        CHF  . Physical abuse Mother   . Depression Mother   . Cancer Sister   . ADD / ADHD Sister   . Alcohol abuse Father   . Alcohol abuse Brother   . Mental illness Brother   . Cancer Sister   . Heart disease Sister   . Physical abuse Sister   . OCD Sister   . Mental illness Sister   . Anxiety disorder Neg Hx   . Bipolar disorder Neg Hx   . Dementia Neg Hx   . Drug abuse Neg Hx   . Paranoid behavior Neg Hx   . Schizophrenia Neg Hx   . Seizures Neg Hx   . Sexual abuse Neg Hx     Past Surgical History:  Procedure Laterality Date  . BREAST SURGERY  2005   lumpectomy - left  . carpel tunnel Right   . CESAREAN SECTION  1971  . COLONOSCOPY WITH PROPOFOL N/A 07/25/2016   Procedure: COLONOSCOPY WITH PROPOFOL;  Surgeon: Danie Binder, MD;  [facility];  Service: Endoscopy;  Laterality: N/A;  8:15 AM  . EYE SURGERY    . LUNG CANCER SURGERY Right 05/23/2012   reportedly clear  .  TONSILLECTOMY    . TUBAL LIGATION     Social History   Occupational History  . Occupation: retired    Comment: Location manager  Tobacco Use  . Smoking status: Former Smoker    Packs/day: 1.00    Years: 10.00    Pack years: 10.00    Types: Cigarettes    Last attempt to quit: 08/14/1974    Years since quitting: 43.7  . Smokeless tobacco: Never Used  Substance and Sexual Activity  . Alcohol use: Yes    Comment: occasionally   .  Drug use: Yes    Types: Marijuana  . Sexual activity: Never

## 2018-05-14 ENCOUNTER — Other Ambulatory Visit (INDEPENDENT_AMBULATORY_CARE_PROVIDER_SITE_OTHER): Payer: Self-pay | Admitting: Orthopaedic Surgery

## 2018-05-21 ENCOUNTER — Telehealth (INDEPENDENT_AMBULATORY_CARE_PROVIDER_SITE_OTHER): Payer: Self-pay | Admitting: Orthopedic Surgery

## 2018-05-21 NOTE — Telephone Encounter (Signed)
Patient left a message stating she was returning your call.  CB#[phone].  Thank you.

## 2018-05-21 NOTE — Telephone Encounter (Signed)
I have not tried calling patient. Was not sure if you had.

## 2018-05-22 ENCOUNTER — Ambulatory Visit (HOSPITAL_COMMUNITY): Payer: Medicare Other

## 2018-05-22 ENCOUNTER — Other Ambulatory Visit (HOSPITAL_COMMUNITY): Payer: Medicare Other

## 2018-05-23 ENCOUNTER — Telehealth (INDEPENDENT_AMBULATORY_CARE_PROVIDER_SITE_OTHER): Payer: Self-pay | Admitting: Orthopedic Surgery

## 2018-05-23 MED ORDER — HYDROCODONE-ACETAMINOPHEN 5-325 MG PO TABS: ORAL_TABLET | ORAL | 0 refills | Status: DC

## 2018-05-23 NOTE — Telephone Encounter (Signed)
y

## 2018-05-23 NOTE — Telephone Encounter (Signed)
Patient called needing Rx refilled (Hydrocodone) The number to contact patient is [phone]

## 2018-05-23 NOTE — Telephone Encounter (Signed)
IC advised could pick up at front desk and take to pharmacy.

## 2018-05-23 NOTE — Telephone Encounter (Signed)
Ok to rf? 

## 2018-05-27 ENCOUNTER — Encounter: Payer: Self-pay | Admitting: Physician Assistant

## 2018-05-27 ENCOUNTER — Ambulatory Visit (INDEPENDENT_AMBULATORY_CARE_PROVIDER_SITE_OTHER): Payer: Medicare Other | Admitting: Physician Assistant

## 2018-05-27 VITALS — BP 140/90 | HR 85 | Temp 97.8°F | Resp 16 | Ht 63.0 in | Wt 255.2 lb

## 2018-05-27 DIAGNOSIS — E785 Hyperlipidemia, unspecified: Secondary | ICD-10-CM

## 2018-05-27 DIAGNOSIS — M25512 Pain in left shoulder: Secondary | ICD-10-CM | POA: Diagnosis not present

## 2018-05-27 DIAGNOSIS — Z01810 Encounter for preprocedural cardiovascular examination: Secondary | ICD-10-CM

## 2018-05-27 DIAGNOSIS — M19012 Primary osteoarthritis, left shoulder: Secondary | ICD-10-CM

## 2018-05-27 DIAGNOSIS — I10 Essential (primary) hypertension: Secondary | ICD-10-CM | POA: Diagnosis not present

## 2018-05-27 DIAGNOSIS — M19011 Primary osteoarthritis, right shoulder: Secondary | ICD-10-CM | POA: Diagnosis not present

## 2018-05-27 DIAGNOSIS — E119 Type 2 diabetes mellitus without complications: Secondary | ICD-10-CM

## 2018-05-27 DIAGNOSIS — M25511 Pain in right shoulder: Secondary | ICD-10-CM | POA: Diagnosis not present

## 2018-05-27 DIAGNOSIS — I2510 Atherosclerotic heart disease of native coronary artery without angina pectoris: Secondary | ICD-10-CM

## 2018-05-27 DIAGNOSIS — G8929 Other chronic pain: Secondary | ICD-10-CM

## 2018-05-27 NOTE — Progress Notes (Signed)
Patient ID: Deborah Mullins MRN: [id number], DOB: [birth year], 67 y.o. Date of Encounter: 05/27/2018, 10:23 AM    Chief Complaint:  Chief Complaint  Patient presents with  . referral to cardiologist    blockage in arteries     HPI: 67 y.o. year old female presents with above.   She reports that Dr. Marlou Sa recently did a CT scan that showed blockage in her arteries so she needs to referral to Cardiologist.   Also states that she needs to have surgery to her shoulders but needs Cardiology clearance before she can have that surgery. She has no other specific concerns to address today. She has been having no chest pressure chest heaviness chest tightness.  No increased shortness of breath or dyspnea on exertion.     Home Meds:   Outpatient Medications Prior to Visit  Medication Sig Dispense Refill  . acetaminophen-codeine (TYLENOL #3) 300-30 MG tablet Take 1 tablet by mouth every 8 (eight) hours as needed for moderate pain. 40 tablet 0  . albuterol (PROVENTIL HFA;VENTOLIN HFA) 108 (90 Base) MCG/ACT inhaler Inhale 2 puffs into the lungs every 4 (four) hours as needed for wheezing or shortness of breath. 1 Inhaler 0  . aspirin 81 MG tablet Take 1 tablet (81 mg total) by mouth daily. 30 tablet 11  . blood glucose meter kit and supplies KIT Check up to twice a day before meals 1 each 6  . buPROPion (WELLBUTRIN SR) 150 MG 12 hr tablet TAKE ONCE DAILY FOR 5 DAYS THEN INCREASE TO ONE TWICE A DAY 180 tablet 0  . cetirizine (ZYRTEC) 10 MG tablet Take 1 tablet (10 mg total) by mouth daily. 30 tablet 11  . FLUoxetine (PROZAC) 40 MG capsule TAKE 1 CAPSULE BY MOUTH EVERY DAY 90 capsule 1  . fluticasone (FLONASE) 50 MCG/ACT nasal spray Place 2 sprays into both nostrils daily. 16 g 6  . gabapentin (NEURONTIN) 800 MG tablet TAKE 1 TABLET (800 MG TOTAL) BY MOUTH 2 (TWO) TIMES DAILY. 180 tablet 1  . glucose blood test strip Check up to twice a day 100 each 1  . HYDROcodone-acetaminophen (NORCO/VICODIN)  5-325 MG tablet 1 po q 8-12hrs prn pain 30 tablet 0  . ibuprofen (ADVIL,MOTRIN) 800 MG tablet TAKE 1 TABLET BY MOUTH EVERY 8 HOURS AS NEEDED 270 tablet 1  . Lancets (ONETOUCH ULTRASOFT) lancets Use as instructed 100 each 1  . losartan (COZAAR) 50 MG tablet Take 1 tablet (50 mg total) by mouth daily. 90 tablet 3  . metFORMIN (GLUCOPHAGE) 500 MG tablet TAKE 1 TABLET (500 MG TOTAL) 2 (TWO) TIMES DAILY WITH A MEAL BY MOUTH. 180 tablet 1  . NON FORMULARY [facility]  Peripheral Neuropathy Cream- Bupivacaine 1%, Doxepin 3%, Gabapentin 6%, Pentoxifylline 3%, Topiramate 1% Apply 1-2 grams to affected area 3-4 times daily Qty. 120 gm 3 refills    . pantoprazole (PROTONIX) 40 MG tablet TAKE 1 TABLET BY MOUTH EVERY DAY 90 tablet 2  . rosuvastatin (CRESTOR) 40 MG tablet TAKE 1 TABLET BY MOUTH EVERY DAY 90 tablet 1  . traMADol (ULTRAM) 50 MG tablet Take 1 tablet (50 mg total) by mouth every 6 (six) hours as needed. 15 tablet 0   No facility-administered medications prior to visit.     Allergies:  Allergies  Allergen Reactions  . Bee Venom Anaphylaxis  . Penicillins Rash    Has patient had a PCN reaction causing immediate rash, facial/tongue/throat swelling, SOB or lightheadedness with hypotension: Yes Has patient had  a PCN reaction causing severe rash involving mucus membranes or skin necrosis: No Has patient had a PCN reaction that required hospitalization No Has patient had a PCN reaction occurring within the last 10 years: No If all of the above answers are "NO", then may proceed with Cephalosporin use.        Review of Systems: See HPI for pertinent ROS. All other ROS negative.    Physical Exam: Blood pressure 140/90, pulse 85, temperature 97.8 F (36.6 C), temperature source Oral, resp. rate 16, height '5\' 3"'$  (1.6 m), weight 115.8 kg, SpO2 96 %., Body mass index is 45.21 kg/m. General:  WNWD WF. Appears in no acute distress. Neck: Supple. No thyromegaly. No  lymphadenopathy. Lungs: Clear bilaterally to auscultation without wheezes, rales, or rhonchi. Breathing is unlabored. Heart: Regular rhythm. No murmurs, rubs, or gallops. Msk:  Strength and tone normal for age. Extremities/Skin: Warm and dry. Neuro: Alert and oriented X 3. Moves all extremities spontaneously. Gait is normal. CNII-XII grossly in tact. Psych:  Responds to questions appropriately with a normal affect.     ASSESSMENT AND PLAN:  67 y.o. year old female with   1. Atherosclerosis of native coronary artery of native heart without angina pectoris CT scan of left shoulder performed on 04/26/2018 shows "coronary artery atherosclerosis ". She needs further management by cardiology.  She prefers to go to the [facility] location. - Ambulatory referral to Cardiology  2. Pre-operative cardiovascular examination She reports that she is having severe pain in both of her shoulders and is "bone on bone and has to have surgery.  Has to have cardiac clearance prior to surgery. - Ambulatory referral to Cardiology  3. Controlled type 2 diabetes mellitus without complication, without long-term current use of insulin ([facility]) In addition to the CT showing coronary artery atherosclerosis, she has known risk factors including diabetes hypertension hyperlipidemia.  She also reports family history of cardiovascular disease.  Reports that her older sister has had 4 stents and that her other sister has had CVA. - Ambulatory referral to Cardiology  4. Essential hypertension In addition to the CT showing coronary artery atherosclerosis, she has known risk factors including diabetes hypertension hyperlipidemia.  She also reports family history of cardiovascular disease.  Reports that her older sister has had 4 stents and that her other sister has had CVA.  - Ambulatory referral to Cardiology  5. Hyperlipidemia, unspecified hyperlipidemia type In addition to the CT showing coronary artery atherosclerosis,  she has known risk factors including diabetes hypertension hyperlipidemia.  She also reports family history of cardiovascular disease.  Reports that her older sister has had 4 stents and that her other sister has had CVA.  - Ambulatory referral to Cardiology  6. Chronic pain of both shoulders This is being managed by Dr. Marlou Sa, orthopedics.  Needs to have surgery once she has cardiac evaluation. - Ambulatory referral to Cardiology  7. Primary osteoarthritis, left shoulder This is being managed by Dr. Marlou Sa, orthopedics.  Needs to have surgery once she has cardiac evaluation.  - Ambulatory referral to Cardiology  8. Primary osteoarthritis, right shoulder This is being managed by Dr. Marlou Sa, orthopedics.  Needs to have surgery once she has cardiac evaluation. - Ambulatory referral to Cardiology   Signed, Olean Ree [place], [place], [facility] 05/27/2018 10:23 AM

## 2018-06-13 ENCOUNTER — Ambulatory Visit: Payer: Medicare Other | Admitting: Physician Assistant

## 2018-06-13 ENCOUNTER — Ambulatory Visit: Payer: Medicare Other | Admitting: Family Medicine

## 2018-06-18 ENCOUNTER — Encounter: Payer: Self-pay | Admitting: Physician Assistant

## 2018-06-19 ENCOUNTER — Encounter: Payer: Self-pay | Admitting: Family Medicine

## 2018-06-19 ENCOUNTER — Other Ambulatory Visit: Payer: Self-pay

## 2018-06-19 ENCOUNTER — Ambulatory Visit (INDEPENDENT_AMBULATORY_CARE_PROVIDER_SITE_OTHER): Payer: Medicare Other | Admitting: Family Medicine

## 2018-06-19 VITALS — BP 132/70 | HR 82 | Temp 98.4°F | Resp 16 | Ht 63.0 in | Wt 153.0 lb

## 2018-06-19 DIAGNOSIS — N183 Chronic kidney disease, stage 3 unspecified: Secondary | ICD-10-CM

## 2018-06-19 DIAGNOSIS — J302 Other seasonal allergic rhinitis: Secondary | ICD-10-CM | POA: Diagnosis not present

## 2018-06-19 DIAGNOSIS — J449 Chronic obstructive pulmonary disease, unspecified: Secondary | ICD-10-CM

## 2018-06-19 DIAGNOSIS — I10 Essential (primary) hypertension: Secondary | ICD-10-CM | POA: Diagnosis not present

## 2018-06-19 DIAGNOSIS — E119 Type 2 diabetes mellitus without complications: Secondary | ICD-10-CM

## 2018-06-19 DIAGNOSIS — I2510 Atherosclerotic heart disease of native coronary artery without angina pectoris: Secondary | ICD-10-CM | POA: Diagnosis not present

## 2018-06-19 DIAGNOSIS — F329 Major depressive disorder, single episode, unspecified: Secondary | ICD-10-CM

## 2018-06-19 DIAGNOSIS — F419 Anxiety disorder, unspecified: Secondary | ICD-10-CM

## 2018-06-19 DIAGNOSIS — E785 Hyperlipidemia, unspecified: Secondary | ICD-10-CM | POA: Diagnosis not present

## 2018-06-19 MED ORDER — CETIRIZINE HCL 10 MG PO TABS: 10.0000 mg | ORAL_TABLET | Freq: Every day | ORAL | 11 refills | Status: DC

## 2018-06-19 MED ORDER — ALBUTEROL SULFATE HFA 108 (90 BASE) MCG/ACT IN AERS: 2.0000 | INHALATION_SPRAY | RESPIRATORY_TRACT | 0 refills | Status: AC | PRN

## 2018-06-19 NOTE — Assessment & Plan Note (Signed)
BP well controlled, at goal today, check CMP, continue meds

## 2018-06-19 NOTE — Patient Instructions (Signed)
Come for fasting labs as soon as you can  Follow up with cardiology and we will help finish up with your pre-op clearance.  Continue all meds and treatment as they are right now.  Return in 3-4 months for fasting labs and follow up visit on all your chronic issues.

## 2018-06-19 NOTE — Assessment & Plan Note (Signed)
Uses albuterol inhaler infrequently, she denies recurrent exacerbations or acute bronchitis with COPD, refill for albuterol inhaler sent through, encourage patient to follow-up with any worsening not improved with albuterol inhaler

## 2018-06-19 NOTE — Assessment & Plan Note (Signed)
Tolerating medication, no side effects, no hypoglycemic episodes, checks sugars infrequently, will repeat labs today, CMP and A1c, continue metformin as prescribed, patient is on statin and ARB

## 2018-06-19 NOTE — Progress Notes (Signed)
Patient ID: Deborah Mullins, female    DOB: 1951/06/01, 67 y.o.   MRN: [id number]  PCP: Orlena Sheldon, PA-C  Chief Complaint  Patient presents with  . Follow-up    is not fasting    Subjective:   Deborah Mullins is a 67 y.o. female, presents to clinic with CC of DM, HTN, HLD, CKD and CAD follow up.  Pt was supposed to f/up every three months for repeated labs and routine f/up on DM, HTN, HLD, last labs also show CKD and recent scan showed CAD - MBD took her off NSAIDS and mobic, recheck renal function today  She does not comply with these recommendations and is here today, accidentally ate/is not fasting today  Reviewed from recent OV with PCP at [facility] and subsequent visit asking for cardiology referral for surgical clearance and for eval of incidental finding of CAD.  All notes reviewed, as well as applicable lab results and images from 05/27/18, 03/11/18   On losartan for BP - is compliant with this, no SE, no high or low reading or concerns, pt denies CP, SOB, lower extremity edema, palpitations, near-syncope, headaches, visual disturbances Crestor 40 mg for HLD and CAD, on ASA per her past PCP, no side effects of statin medication, denies myalgias she does have bilateral shoulder joint pain which she states is from severe arthritis and bone-on-bone but no other muscle aches and her body is not no known side effects including malaise, abdominal pain, nausea, vomiting, scleral icterus or urinary changes Metformin 500 mg BID for DM -states she is compliant with this, checks her sugar occasionally, has not had any low sugars to report, last time she checked her sugars was in the evening sometime this last week and it was 117. prozac and wellbutrin - says she takes cause "shes snappy" she feels that her moods are well managed with these medications, she denies any anxiety attacks, panic attacks, suicidal ideations, homicidal ideations or auditory or visual hallucinations she is sleeping well,  has no trouble with her focus or controlling her emotions.  Has COPD - need refill of ProAir, previously went to pulmonology, states she just gets short of breath with going up stairs, and notes that several years ago he was diagnosed with lung cancer and COPD.  She rarely gets bronchitis or uses oral steroids.  She does see Dr. Lamonte Sakai at [facility] pulmonology, last visit ~1year ago.  Currently does not have any productive cough, wheeze or shortness of breath at baseline.  She states she is a former smoker without any other history of lung disease.  Is due to see cardiology in the next couple weeks for evaluation of CAD and for clearance so she can have shoulder surgery.  Has no other complaints or acute issues.  Patient Active Problem List   Diagnosis Date Noted  . Primary osteoarthritis, left shoulder 04/01/2018  . Primary osteoarthritis, right shoulder 04/01/2018  . Essential hypertension 01/22/2017  . Controlled type 2 diabetes mellitus without complication, without long-term current use of insulin ([place]) 11/23/2016  . Chronic pain of both shoulders 07/24/2016  . Prediabetes   . Anxiety   . Bipolar 1 disorder ([place])   . Depression   . Chronic back pain 11/07/2012  . Insomnia secondary to depression with anxiety 09/23/2012  . Unspecified vitamin D deficiency 09/23/2012  . COPD, mild ([place]) 07/01/2012  . Acute bronchitis 06/08/2012  . Adenocarcinoma of lung, stage 1 ([place]) 05/23/2012  . RECTAL BLEEDING 04/27/2010  .  HEMORRHOIDS 03/11/2009  . ADENOCARCINOMA, BREAST 12/15/2008  . FIBROIDS, UTERUS 12/15/2008  . HYPERCHOLESTEROLEMIA 12/15/2008  . Hyperlipidemia 12/15/2008  . Anxiety and depression 12/15/2008  . GASTROESOPHAGEAL REFLUX DISEASE 12/15/2008  . GASTRITIS 12/15/2008  . CONSTIPATION 12/15/2008  . FLATULENCE ERUCTATION AND GAS PAIN 12/15/2008  . ABDOMINAL PAIN 12/15/2008  . RECTAL BLEEDING, HX OF 12/15/2008    Current Meds  Medication Sig  . acetaminophen-codeine (TYLENOL  #3) 300-30 MG tablet Take 1 tablet by mouth every 8 (eight) hours as needed for moderate pain.  Marland Kitchen albuterol (PROVENTIL HFA;VENTOLIN HFA) 108 (90 Base) MCG/ACT inhaler Inhale 2 puffs into the lungs every 4 (four) hours as needed for wheezing or shortness of breath.  Marland Kitchen aspirin 81 MG tablet Take 1 tablet (81 mg total) by mouth daily.  . blood glucose meter kit and supplies KIT Check up to twice a day before meals  . buPROPion (WELLBUTRIN SR) 150 MG 12 hr tablet TAKE ONCE DAILY FOR 5 DAYS THEN INCREASE TO ONE TWICE A DAY  . cetirizine (ZYRTEC) 10 MG tablet Take 1 tablet (10 mg total) by mouth daily. (Patient taking differently: Take 20 mg by mouth daily. )  . FLUoxetine (PROZAC) 40 MG capsule TAKE 1 CAPSULE BY MOUTH EVERY DAY  . fluticasone (FLONASE) 50 MCG/ACT nasal spray Place 2 sprays into both nostrils daily.  Marland Kitchen gabapentin (NEURONTIN) 800 MG tablet TAKE 1 TABLET (800 MG TOTAL) BY MOUTH 2 (TWO) TIMES DAILY.  Marland Kitchen glucose blood test strip Check up to twice a day  . HYDROcodone-acetaminophen (NORCO/VICODIN) 5-325 MG tablet 1 po q 8-12hrs prn pain  . ibuprofen (ADVIL,MOTRIN) 800 MG tablet TAKE 1 TABLET BY MOUTH EVERY 8 HOURS AS NEEDED  . Lancets (ONETOUCH ULTRASOFT) lancets Use as instructed  . losartan (COZAAR) 50 MG tablet Take 1 tablet (50 mg total) by mouth daily.  . metFORMIN (GLUCOPHAGE) 500 MG tablet TAKE 1 TABLET (500 MG TOTAL) 2 (TWO) TIMES DAILY WITH A MEAL BY MOUTH.  Salley Scarlet FORMULARY [facility]  Peripheral Neuropathy Cream- Bupivacaine 1%, Doxepin 3%, Gabapentin 6%, Pentoxifylline 3%, Topiramate 1% Apply 1-2 grams to affected area 3-4 times daily Qty. 120 gm 3 refills  . pantoprazole (PROTONIX) 40 MG tablet TAKE 1 TABLET BY MOUTH EVERY DAY  . rosuvastatin (CRESTOR) 40 MG tablet TAKE 1 TABLET BY MOUTH EVERY DAY  . traMADol (ULTRAM) 50 MG tablet Take 1 tablet (50 mg total) by mouth every 6 (six) hours as needed.     Review of Systems  Constitutional: Negative.  Negative for  activity change, appetite change, fatigue and unexpected weight change.  HENT: Negative.  Negative for congestion.   Eyes: Negative.   Respiratory: Negative.  Negative for cough, choking, chest tightness and shortness of breath.   Cardiovascular: Negative.  Negative for chest pain, palpitations and leg swelling.  Gastrointestinal: Negative.  Negative for abdominal pain, blood in stool, diarrhea, nausea and vomiting.  Endocrine: Negative.  Negative for heat intolerance, polydipsia, polyphagia and polyuria.  Genitourinary: Negative.  Negative for decreased urine volume, difficulty urinating, dysuria, enuresis, frequency and urgency.  Musculoskeletal: Positive for arthralgias. Negative for back pain, gait problem, joint swelling, myalgias, neck pain and neck stiffness.  Skin: Negative.  Negative for color change, pallor and rash.  Allergic/Immunologic: Negative.   Neurological: Negative.  Negative for dizziness, syncope, weakness, light-headedness, numbness and headaches.  Hematological: Negative.   Psychiatric/Behavioral: Negative.  Negative for behavioral problems, confusion, dysphoric mood, self-injury and suicidal ideas. The patient is not nervous/anxious.   All  other systems reviewed and are negative.      Objective:    Vitals:   06/19/18 1047  BP: 132/70  Pulse: 82  Resp: 16  Temp: 98.4 F (36.9 C)  TempSrc: Oral  SpO2: 97%  Weight: 153 lb (69.4 kg)  Height: '5\' 3"'$  (1.6 m)      Physical Exam  Constitutional: She is oriented to person, place, and time. She appears well-developed and well-nourished.  Non-toxic appearance. No distress.  HENT:  Head: Normocephalic and atraumatic.  Right Ear: External ear normal.  Left Ear: External ear normal.  Nose: Nose normal.  Mouth/Throat: Uvula is midline, oropharynx is clear and moist and mucous membranes are normal.  Eyes: Pupils are equal, round, and reactive to light. Conjunctivae, EOM and lids are normal. No scleral icterus.    Neck: Normal range of motion and phonation normal. Neck supple. No tracheal deviation present.  Cardiovascular: Normal rate, regular rhythm, normal heart sounds, intact distal pulses and normal pulses. Exam reveals no gallop and no friction rub.  No murmur heard. Pulses:      Radial pulses are 2+ on the right side, and 2+ on the left side.       Posterior tibial pulses are 2+ on the right side, and 2+ on the left side.  Pulmonary/Chest: Effort normal and breath sounds normal. No stridor. No respiratory distress. She has no wheezes. She has no rhonchi. She has no rales. She exhibits no tenderness.  Abdominal: Soft. Normal appearance and bowel sounds are normal. She exhibits no distension and no mass. There is no tenderness. There is no rebound and no guarding.  Musculoskeletal: She exhibits no edema or deformity.  Lymphadenopathy:    She has no cervical adenopathy.  Neurological: She is alert and oriented to person, place, and time. She exhibits normal muscle tone. Coordination and gait normal.  Skin: Skin is warm, dry and intact. Capillary refill takes less than 2 seconds. No rash noted. She is not diaphoretic. No pallor.  Psychiatric: She has a normal mood and affect. Her speech is normal and behavior is normal. Judgment and thought content normal.  Nursing note and vitals reviewed.         Assessment & Plan:   Problem List Items Addressed This Visit      Cardiovascular and Mediastinum   Essential hypertension - Primary    BP well controlled, at goal today, check CMP, continue meds      Relevant Orders   COMPLETE METABOLIC PANEL WITH GFR     Respiratory   COPD, mild (HCC)    Uses albuterol inhaler infrequently, she denies recurrent exacerbations or acute bronchitis with COPD, refill for albuterol inhaler sent through, encourage patient to follow-up with any worsening not improved with albuterol inhaler      Relevant Medications   albuterol (PROVENTIL HFA;VENTOLIN HFA) 108 (90  Base) MCG/ACT inhaler   cetirizine (ZYRTEC) 10 MG tablet     Endocrine   Controlled type 2 diabetes mellitus without complication, without long-term current use of insulin (HCC)    Tolerating medication, no side effects, no hypoglycemic episodes, checks sugars infrequently, will repeat labs today, CMP and A1c, continue metformin as prescribed, patient is on statin and ARB      Relevant Orders   COMPLETE METABOLIC PANEL WITH GFR   Hemoglobin A1c   Lipid panel     Other   Anxiety and depression (Chronic)    Pt reports well managed with current meds, no SE, no SI, HI  AVH, no depression or anxiety sx con't current meds       Hyperlipidemia    Tolerating statin, no SE, repeat FLP, encouraged lifestyle changes, diet/exercise as permitted by pain and other conditions      Relevant Orders   COMPLETE METABOLIC PANEL WITH GFR   CBC with Differential/Platelet   Hemoglobin A1c   Lipid panel    Other Visit Diagnoses    Atherosclerosis of native coronary artery of native heart without angina pectoris       Relevant Orders   COMPLETE METABOLIC PANEL WITH GFR   Lipid panel   Stage 3 chronic kidney disease (HCC)       Relevant Orders   COMPLETE METABOLIC PANEL WITH GFR   Allergic rhinitis       Relevant Medications   cetirizine (ZYRTEC) 10 MG tablet      Last lab work was reviewed by me, did show some mild CKD stage III, will repeat labs  Patient did want a refill of allergy medications, she typically has worsening symptoms in April and fall  She was seeing Karis Juba every 3 months, unfortunately her PCP is currently not at this clinic, patient does not want to do every 3 months, she is very hesitant to come a few times a year, will attempt to stretch this out to every 4 months, advised patient she does need to come repeat her lab work prior to her next visit and follow-up in early March, when checking out she try to make this appointment 6 months from now, I again reiterated that  we need to make sure everything is stable especially with cardiac evaluation for no evidence of coronary artery disease and then once things are stable we can try and space out some of our routine follow-up.    Delsa Grana, PA-C 06/19/18 10:56 AM

## 2018-06-19 NOTE — Assessment & Plan Note (Signed)
Tolerating statin, no SE, repeat FLP, encouraged lifestyle changes, diet/exercise as permitted by pain and other conditions

## 2018-06-19 NOTE — Assessment & Plan Note (Addendum)
Pt reports well managed with current meds, no SE, no SI, HI AVH, no depression or anxiety sx con't current meds

## 2018-06-26 ENCOUNTER — Telehealth (INDEPENDENT_AMBULATORY_CARE_PROVIDER_SITE_OTHER): Payer: Self-pay | Admitting: Orthopaedic Surgery

## 2018-06-26 MED ORDER — HYDROCODONE-ACETAMINOPHEN 5-325 MG PO TABS: ORAL_TABLET | ORAL | 0 refills | Status: DC

## 2018-06-26 NOTE — Telephone Encounter (Signed)
Okay to refill hydrocodone 1 p.o. daily to q. every other day #30.  She also will need to have cardiac risk gratification before surgery if she decides for surgery.  If she does not decide for surgery and wants to try to manage this with narcotics then we will have to send her to pain management.  Please inform her of same.  Thanks

## 2018-06-26 NOTE — Telephone Encounter (Signed)
Please advise 

## 2018-06-26 NOTE — Telephone Encounter (Signed)
Medication Refill  Hydrocodone

## 2018-06-26 NOTE — Telephone Encounter (Signed)
Tried calling patient. No answer. LMVM advising per Dr Marlou Sa. Advised could pick up rx to take to pharmacy at the front desk.

## 2018-06-26 NOTE — Telephone Encounter (Signed)
I referred her to Dr. Marlou Sa who then saw her about scheduling a shoulder replacement.  I'll defer to him.

## 2018-06-26 NOTE — Telephone Encounter (Signed)
Please see below. Please advise. Thanks.

## 2018-07-01 ENCOUNTER — Other Ambulatory Visit: Payer: Self-pay | Admitting: Physician Assistant

## 2018-07-01 DIAGNOSIS — F32A Depression, unspecified: Secondary | ICD-10-CM

## 2018-07-01 DIAGNOSIS — F419 Anxiety disorder, unspecified: Principal | ICD-10-CM

## 2018-07-01 DIAGNOSIS — F329 Major depressive disorder, single episode, unspecified: Secondary | ICD-10-CM

## 2018-07-09 ENCOUNTER — Encounter: Payer: Self-pay | Admitting: Cardiology

## 2018-07-09 NOTE — Progress Notes (Signed)
Cardiology Office Note  Date: 07/10/2018   ID: Deborah Mullins, DOB Jan 12, 1951, MRN [id number]  PCP: Susy Frizzle, MD  Consulting Cardiologist: Rozann Lesches, MD   Chief Complaint  Patient presents with  . Preoperative cardiac evaluation    History of Present Illness: Deborah Mullins is a 67 y.o. female referred for cardiology consultation by Ms. Dixon PA-C for preoperative cardiac evaluation and also assessment of coronary artery calcifications by CT imaging.  She is being evaluated by Dr. Marlou Sa with advanced arthritis of the shoulders and being considered for surgical management under general anesthesia.  As part of her evaluation she underwent CT imaging of the shoulders which incidentally mentioned coronary artery atherosclerosis.  She presents today reporting no exertional chest pain.  She has chronic dyspnea on exertion, not worse recently, no syncope.  She is able to climb a flight of steps with shortness of breath.  Also limited by arthritic pain in her shoulders and also knees.  He is currently on Crestor for treatment of hyperlipidemia, LDL was 74 back in July.  Also on antihypertensive therapy.  Has a history of left-sided breast cancer, underwent surgical therapy followed by radiation back in 2005.  She has not undergone any previous objective ischemic testing.  I did review her ECG from earlier this year.  Past Medical History:  Diagnosis Date  . Allergic rhinitis   . Anxiety   . Arthritis   . Bipolar 1 disorder ([place])   . Breast cancer ([place]) 2004  . Carpal tunnel syndrome of left wrist   . Chronic neck pain   . Chronic shoulder pain    L>R  . Depression   . GERD (gastroesophageal reflux disease)   . Headache(784.0)   . History of lung surgery   . Hyperlipidemia   . Hypertension   . Lung cancer ([place])   . Lung nodule 2009  . Pain in both feet   . Personal history of radiation therapy 2004  . PONV (postoperative nausea and vomiting)   . Type 2 diabetes  mellitus ([place])     Past Surgical History:  Procedure Laterality Date  . BREAST SURGERY  2005   lumpectomy - left  . carpel tunnel Right   . CESAREAN SECTION  1971  . COLONOSCOPY WITH PROPOFOL N/A 07/25/2016   Procedure: COLONOSCOPY WITH PROPOFOL;  Surgeon: Danie Binder, MD;  Location: [facility];  Service: Endoscopy;  Laterality: N/A;  8:15 AM  . EYE SURGERY    . LUNG CANCER SURGERY Right 05/23/2012   reportedly clear  . TONSILLECTOMY    . TUBAL LIGATION      Current Outpatient Medications  Medication Sig Dispense Refill  . acetaminophen-codeine (TYLENOL #3) 300-30 MG tablet Take 1 tablet by mouth every 8 (eight) hours as needed for moderate pain. 40 tablet 0  . albuterol (PROVENTIL HFA;VENTOLIN HFA) 108 (90 Base) MCG/ACT inhaler Inhale 2 puffs into the lungs every 4 (four) hours as needed for wheezing or shortness of breath. 1 Inhaler 0  . albuterol (PROVENTIL HFA;VENTOLIN HFA) 108 (90 Base) MCG/ACT inhaler Inhale 2 puffs into the lungs every 4 (four) hours as needed for wheezing or shortness of breath. 1 Inhaler 0  . aspirin 81 MG tablet Take 1 tablet (81 mg total) by mouth daily. 30 tablet 11  . blood glucose meter kit and supplies KIT Check up to twice a day before meals 1 each 6  . buPROPion (WELLBUTRIN SR) 150 MG 12 hr tablet TAKE  ONCE DAILY FOR 5 DAYS THEN INCREASE TO ONE TWICE A DAY 180 tablet 0  . cetirizine (ZYRTEC) 10 MG tablet Take 1 tablet (10 mg total) by mouth daily. 30 tablet 11  . FLUoxetine (PROZAC) 40 MG capsule TAKE 1 CAPSULE BY MOUTH EVERY DAY 90 capsule 1  . fluticasone (FLONASE) 50 MCG/ACT nasal spray Place 2 sprays into both nostrils daily. 16 g 6  . gabapentin (NEURONTIN) 800 MG tablet TAKE 1 TABLET (800 MG TOTAL) BY MOUTH 2 (TWO) TIMES DAILY. 180 tablet 1  . glucose blood test strip Check up to twice a day 100 each 1  . HYDROcodone-acetaminophen (NORCO/VICODIN) 5-325 MG tablet 1 every other day as needed for pain 30 tablet 0  . Lancets (ONETOUCH  ULTRASOFT) lancets Use as instructed 100 each 1  . losartan (COZAAR) 50 MG tablet Take 1 tablet (50 mg total) by mouth daily. 90 tablet 3  . metFORMIN (GLUCOPHAGE) 500 MG tablet TAKE 1 TABLET (500 MG TOTAL) 2 (TWO) TIMES DAILY WITH A MEAL BY MOUTH. 180 tablet 1  . NON FORMULARY [facility]  Peripheral Neuropathy Cream- Bupivacaine 1%, Doxepin 3%, Gabapentin 6%, Pentoxifylline 3%, Topiramate 1% Apply 1-2 grams to affected area 3-4 times daily Qty. 120 gm 3 refills    . pantoprazole (PROTONIX) 40 MG tablet TAKE 1 TABLET BY MOUTH EVERY DAY 90 tablet 2  . rosuvastatin (CRESTOR) 40 MG tablet TAKE 1 TABLET BY MOUTH EVERY DAY 90 tablet 1  . traMADol (ULTRAM) 50 MG tablet Take 1 tablet (50 mg total) by mouth every 6 (six) hours as needed. 15 tablet 0   No current facility-administered medications for this visit.    Allergies:  Bee venom and Penicillins   Social History: The patient  reports that she quit smoking about 43 years ago. Her smoking use included cigarettes. She has a 10.00 pack-year smoking history. She has never used smokeless tobacco. She reports that she drinks alcohol. She reports that she has current or past drug history. Drug: Marijuana.   Family History: The patient's family history includes ADD / ADHD in her sister; Alcohol abuse in her brother and father; Cancer in her sister and sister; Depression in her mother; Heart disease in her mother and sister; Mental illness in her brother and sister; OCD in her sister; Physical abuse in her mother and sister.   ROS:  Please see the history of present illness. Otherwise, complete review of systems is positive for none.  All other systems are reviewed and negative.   Physical Exam: VS:  BP 122/78 (BP Location: Right Arm)   Pulse 94   Ht '5\' 3"'$  (1.6 m)   Wt 151 lb (68.5 kg)   SpO2 98%   BMI 26.75 kg/m , BMI Body mass index is 26.75 kg/m.  Wt Readings from Last 3 Encounters:  07/10/18 151 lb (68.5 kg)  06/19/18 153 lb (69.4  kg)  05/27/18 255 lb 3.2 oz (115.8 kg)    General: Patient appears comfortable at rest. HEENT: Conjunctiva and lids normal, oropharynx clear. Neck: Supple, no elevated JVP or carotid bruits, no thyromegaly. Lungs: Clear to auscultation, nonlabored breathing at rest. Cardiac: Regular rate and rhythm, no S3 or significant systolic murmur. Abdomen: Soft, nontender, bowel sounds present, no guarding or rebound. Extremities: No pitting edema, distal pulses 2+. Skin: Warm and dry. Musculoskeletal: No kyphosis. Neuropsychiatric: Alert and oriented x3, affect grossly appropriate.  ECG: I personally reviewed the tracing from 10/13/2017 which showed sinus tachycardia.  Recent Labwork: 03/12/2018: ALT  17; AST 20; BUN 21; Creat 1.25; Hemoglobin 12.9; Platelets 227; Potassium 4.4; Sodium 140; TSH 3.14     Component Value Date/Time   CHOL 163 03/12/2018 0930   TRIG 128 03/12/2018 0930   HDL 68 03/12/2018 0930   CHOLHDL 2.4 03/12/2018 0930   VLDL 30 01/22/2017 0908   LDLCALC 74 03/12/2018 0930    Other Studies Reviewed Today:  CT left shoulder 04/26/2018: FINDINGS: Bones/Joint/Cartilage  No fracture or dislocation. Normal alignment. No joint effusion.  Moderate to severe osteoarthritis of the glenohumeral joint with severe joint space narrowing, bone-on-bone appearance, subchondral sclerosis, subchondral cystic changes and marginal osteophytosis.  Mild arthropathy of the acromioclavicular joint.  Type II acromion.  Ligaments  Ligaments are suboptimally evaluated by CT.  Muscles and Tendons No muscle atrophy. Rotator cuff grossly intact.  Soft tissue No fluid collection or hematoma.  No soft tissue mass.  Stable radiation changes in the anterior left upper lobe. Coronary artery atherosclerosis. Surgical clips in the left breast.  IMPRESSION: 1. Moderate to severe glenohumeral osteoarthritis.  CT right shoulder 04/26/2018: FINDINGS: Bones/Joint/Cartilage  No fracture  or dislocation. Normal alignment. Small joint effusion. Small subacromial/subdeltoid bursal fluid.  Severe osteoarthritis of the glenohumeral joint with severe joint space narrowing, bone-on-bone appearance, subchondral sclerosis, subchondral cystic changes and marginal osteophytosis. Multiple loose bodies measuring up to 2.0 cm.  Moderate arthropathy of the acromioclavicular joint. Type II acromion.  Ligaments  Ligaments are suboptimally evaluated by CT.  Muscles and Tendons No muscle atrophy.  Rotator cuff is grossly intact.  Soft tissue No fluid collection or hematoma.  No soft tissue mass.  Postsurgical changes of the right lower lobe related to prior superior segment see.  IMPRESSION: 1. Severe glenohumeral osteoarthritis with multiple loose bodies. 2. Fluid in the subacromial/subdeltoid bursa could reflect bursitis or underlying full-thickness rotator cuff tear. No muscle atrophy to suggest chronic high-grade tear.  Assessment and Plan:  1.  Preoperative cardiac evaluation in a 67 year old woman with coronary artery calcifications by CT imaging, hypertension, hyperlipidemia on statin therapy, heart disease in her mother and sister, and a more remote history of tobacco use.  She does not describe any angina symptoms but does have chronic dyspnea on exertion, able to climb a flight of steps.  I reviewed her interval ECG.  She has not undergone any objective ischemic testing and we will arrange a [place] with further recommendations to follow.  2.  Hyperlipidemia, on Crestor.  Most recent LDL was well controlled at 74.  3.  Essential hypertension, on Cozaar with good blood pressure control today.  4.  Coronary artery calcifications by CT imaging.  She is on aspirin and statin therapy.  Current medicines were reviewed with the patient today.   Orders Placed This Encounter  Procedures  . NM Myocar Multi W/Spect W/Wall Motion / EF    Disposition: Call  with test results and further recommendations.  Signed, Satira Sark, MD, [facility] 07/10/2018 1:42 PM    [facility] [address] [place],  [address] Phone: [phone]; Fax: [phone]

## 2018-07-10 ENCOUNTER — Encounter: Payer: Self-pay | Admitting: Cardiology

## 2018-07-10 ENCOUNTER — Other Ambulatory Visit: Payer: Self-pay | Admitting: Family Medicine

## 2018-07-10 ENCOUNTER — Ambulatory Visit (INDEPENDENT_AMBULATORY_CARE_PROVIDER_SITE_OTHER): Payer: Medicare Other | Admitting: Cardiology

## 2018-07-10 ENCOUNTER — Other Ambulatory Visit: Payer: Self-pay | Admitting: Physician Assistant

## 2018-07-10 VITALS — BP 122/78 | HR 94 | Ht 63.0 in | Wt 151.0 lb

## 2018-07-10 DIAGNOSIS — E782 Mixed hyperlipidemia: Secondary | ICD-10-CM | POA: Diagnosis not present

## 2018-07-10 DIAGNOSIS — Z01810 Encounter for preprocedural cardiovascular examination: Secondary | ICD-10-CM

## 2018-07-10 DIAGNOSIS — I10 Essential (primary) hypertension: Secondary | ICD-10-CM | POA: Diagnosis not present

## 2018-07-10 DIAGNOSIS — I2510 Atherosclerotic heart disease of native coronary artery without angina pectoris: Secondary | ICD-10-CM | POA: Diagnosis not present

## 2018-07-10 DIAGNOSIS — Z1231 Encounter for screening mammogram for malignant neoplasm of breast: Secondary | ICD-10-CM

## 2018-07-10 NOTE — Patient Instructions (Signed)
Medication Instructions:  Your physician recommends that you continue on your current medications as directed. Please refer to the Current Medication list given to you today.   Labwork: NONE  Testing/Procedures: Your physician has requested that you have a lexiscan myoview. For further information please visit [email or web address]. Please follow instruction sheet, as given.    Follow-Up: Your physician recommends that you schedule a follow-up appointment in: AS NEEDED, WILL CALL WITH TEST RESULTS    Any Other Special Instructions Will Be Listed Below (If Applicable).     If you need a refill on your cardiac medications before your next appointment, please call your pharmacy.

## 2018-07-15 ENCOUNTER — Telehealth: Payer: Self-pay | Admitting: Family Medicine

## 2018-07-15 NOTE — Telephone Encounter (Signed)
Patient calling to see if she can get an order put in for bone density test if possible  [phone]

## 2018-07-15 NOTE — Telephone Encounter (Signed)
There is an order already in Epic that was entered on 03/11/18 by Doreen Beam at [facility]

## 2018-07-18 ENCOUNTER — Encounter (HOSPITAL_COMMUNITY)
Admission: RE | Admit: 2018-07-18 | Discharge: 2018-07-18 | Disposition: A | Discharge status: Home / Self Care | Payer: Medicare Other | Source: Ambulatory Visit | Attending: Cardiology | Admitting: Cardiology

## 2018-07-18 ENCOUNTER — Ambulatory Visit (HOSPITAL_COMMUNITY)
Admission: RE | Admit: 2018-07-18 | Discharge: 2018-07-18 | Disposition: A | Discharge status: Home / Self Care | Payer: Medicare Other | Source: Ambulatory Visit | Attending: Family Medicine | Admitting: Family Medicine

## 2018-07-18 ENCOUNTER — Encounter (HOSPITAL_COMMUNITY): Payer: Self-pay

## 2018-07-18 ENCOUNTER — Encounter (HOSPITAL_BASED_OUTPATIENT_CLINIC_OR_DEPARTMENT_OTHER)
Admission: RE | Admit: 2018-07-18 | Discharge: 2018-07-18 | Disposition: A | Discharge status: Home / Self Care | Payer: Medicare Other | Source: Ambulatory Visit | Attending: Cardiology | Admitting: Cardiology

## 2018-07-18 DIAGNOSIS — I2510 Atherosclerotic heart disease of native coronary artery without angina pectoris: Secondary | ICD-10-CM | POA: Diagnosis not present

## 2018-07-18 DIAGNOSIS — Z1231 Encounter for screening mammogram for malignant neoplasm of breast: Secondary | ICD-10-CM | POA: Insufficient documentation

## 2018-07-18 DIAGNOSIS — Z01810 Encounter for preprocedural cardiovascular examination: Secondary | ICD-10-CM

## 2018-07-18 LAB — NM MYOCAR MULTI W/SPECT W/WALL MOTION / EF
CHL CUP NUCLEAR SSS: 1
LHR: 0.41
LV dias vol: 49 mL (ref 46–106)
LV sys vol: 9 mL
Peak HR: 134 {beats}/min
Rest HR: 76 {beats}/min
SDS: 1
SRS: 0
TID: 0.96

## 2018-07-18 MED ORDER — REGADENOSON 0.4 MG/5ML IV SOLN
INTRAVENOUS | Status: AC
  Administered 2018-07-18: 0.4 mg via INTRAVENOUS
  Filled 2018-07-18: qty 5

## 2018-07-18 MED ORDER — TECHNETIUM TC 99M TETROFOSMIN IV KIT
30.0000 | PACK | Freq: Once | INTRAVENOUS | Status: AC | PRN
  Administered 2018-07-18: 33 via INTRAVENOUS

## 2018-07-18 MED ORDER — SODIUM CHLORIDE 0.9% FLUSH
INTRAVENOUS | Status: AC
  Administered 2018-07-18: 10 mL via INTRAVENOUS
  Filled 2018-07-18: qty 10

## 2018-07-18 MED ORDER — TECHNETIUM TC 99M TETROFOSMIN IV KIT
10.0000 | PACK | Freq: Once | INTRAVENOUS | Status: AC | PRN
  Administered 2018-07-18: 9.9 via INTRAVENOUS

## 2018-07-19 ENCOUNTER — Telehealth (INDEPENDENT_AMBULATORY_CARE_PROVIDER_SITE_OTHER): Payer: Self-pay | Admitting: *Deleted

## 2018-07-19 ENCOUNTER — Telehealth: Payer: Self-pay

## 2018-07-19 NOTE — Telephone Encounter (Signed)
-----   Message from Satira Sark, MD sent at 07/18/2018  3:20 PM EST ----- Results reviewed.  Stress test was low risk, no evidence of ischemia and LVEF vigorous.  She should be able to proceed with orthopedic surgery at an acceptable perioperative cardiac risk.  Please obtain any preoperative clearance forms from surgeon for completion. A copy of this test should be forwarded to Susy Frizzle, MD.

## 2018-07-19 NOTE — Telephone Encounter (Signed)
I cannot reach pt, phone says call back, cannot leave message. Dr.Gregory Dean's office-ortho- will fax cardiac clearance for Dr.McDowell to complete

## 2018-07-22 ENCOUNTER — Telehealth (INDEPENDENT_AMBULATORY_CARE_PROVIDER_SITE_OTHER): Payer: Self-pay | Admitting: Orthopaedic Surgery

## 2018-07-22 MED ORDER — HYDROCODONE-ACETAMINOPHEN 5-325 MG PO TABS: 1.0000 | ORAL_TABLET | Freq: Four times a day (QID) | ORAL | 0 refills | Status: DC | PRN

## 2018-07-22 NOTE — Telephone Encounter (Signed)
Please advise 

## 2018-07-22 NOTE — Telephone Encounter (Signed)
Patient stated she told that she could get script today. Went to pharmacy and was told that RX script was written  And can't be filled until January. She wants to know why and what I she to do until then?  Please call patient to advise [phone]

## 2018-07-22 NOTE — Progress Notes (Signed)
Suhailah looks like she has cardiac clearance.  Can you post her for left shoulder replacement with [employer] patient specific instrumentation thanks

## 2018-07-22 NOTE — Telephone Encounter (Signed)
Patient called requesting an RX refill on her Hydrocodone and needs the dosage to be every six hours not every other day.  CB#[phone].  Thank you.

## 2018-07-22 NOTE — Telephone Encounter (Signed)
I will refill this medication just one more time from my standpoint.  I will defer any other medications to Dr. Marlou Sa or her primary care physician.  Please let her know that we cannot keep her on narcotics long-term and that she may need a pain management referral.

## 2018-07-22 NOTE — Telephone Encounter (Signed)
LMOM for patient of the below message  

## 2018-07-23 ENCOUNTER — Telehealth (INDEPENDENT_AMBULATORY_CARE_PROVIDER_SITE_OTHER): Payer: Self-pay | Admitting: Orthopaedic Surgery

## 2018-07-23 MED ORDER — HYDROCODONE-ACETAMINOPHEN 5-325 MG PO TABS: 1.0000 | ORAL_TABLET | Freq: Four times a day (QID) | ORAL | 0 refills | Status: DC | PRN

## 2018-07-23 NOTE — Telephone Encounter (Signed)
ok 

## 2018-07-23 NOTE — Telephone Encounter (Signed)
Patient aware this is fixed now

## 2018-07-23 NOTE — Telephone Encounter (Signed)
The "every other day" is ON the prescription of her Hydrocodone. I don't know why or how it is there but can you send another Rx to her pharmacy without this, they will not fill it until then

## 2018-07-23 NOTE — Telephone Encounter (Signed)
Pt called back saying that [facility]one she expressed she wants them every 6 hours.

## 2018-07-25 ENCOUNTER — Telehealth (INDEPENDENT_AMBULATORY_CARE_PROVIDER_SITE_OTHER): Payer: Self-pay | Admitting: Orthopedic Surgery

## 2018-07-25 NOTE — Telephone Encounter (Signed)
Left voice mail message at [phone], providing my name and direct extension.   There are calling restrictions on the primary contact number [phone] which was provided by the pateint.  I will be glad to schedule patient's shoulder replacement surgery with Dr. Marlou Sa once contact is made.

## 2018-07-25 NOTE — Telephone Encounter (Signed)
noted 

## 2018-08-20 ENCOUNTER — Other Ambulatory Visit (INDEPENDENT_AMBULATORY_CARE_PROVIDER_SITE_OTHER): Payer: Self-pay | Admitting: Orthopedic Surgery

## 2018-08-20 ENCOUNTER — Other Ambulatory Visit: Payer: Self-pay | Admitting: *Deleted

## 2018-08-20 DIAGNOSIS — M19012 Primary osteoarthritis, left shoulder: Secondary | ICD-10-CM

## 2018-08-20 DIAGNOSIS — E119 Type 2 diabetes mellitus without complications: Secondary | ICD-10-CM

## 2018-08-20 DIAGNOSIS — G629 Polyneuropathy, unspecified: Secondary | ICD-10-CM

## 2018-08-20 MED ORDER — METFORMIN HCL 500 MG PO TABS: 500.0000 mg | ORAL_TABLET | Freq: Two times a day (BID) | ORAL | 1 refills | Status: DC

## 2018-08-20 MED ORDER — GABAPENTIN 800 MG PO TABS: 800.0000 mg | ORAL_TABLET | Freq: Two times a day (BID) | ORAL | 1 refills | Status: DC

## 2018-08-21 NOTE — Pre-Procedure Instructions (Signed)
Deborah Mullins  08/21/2018      [id number] [place] [place] [address] [facility] [address] [place] [address] Phone: [phone] Fax: [phone]    Your procedure is scheduled on  Tuesday, January 14.  Report to [facility] Admitting at 5:30 AM                 Your surgery or procedure is scheduled for 7:30 AM    Call this number if you have problems the morning of [phone]  This is the number for the Pre- Surgical Desk.                For any other questions, please call [phone], Monday - Friday 8 AM - 4 PM.   Remember:  Do not eat or drink after midnight  Monday, January 13.    Take these medicines the morning of surgery with A SIP OF WATER: buPROPion (WELLBUTRIN SR) cetirizine (ZYRTEC)  FLUoxetine (PROZAC) gabapentin (NEURONTIN) pantoprazole (PROTONIX)  rosuvastatin (CRESTOR)   Take if needed: albuterol (PROVENTIL HFA;VENTOLIN HFA) inhaler, please bring it with you. fluticasone (FLONASE) nasal spray HYDROcodone-acetaminophen (NORCO/VICODIN)  DO NOT Take Metformion the morning of surgery  Follow Dr. Randel Pigg instructions on if/when to stop Aspirin.   Today STOP/Do not Start taking  Aspirin Products (Goody Powder, Excedrin Migraine), Ibuprofen (Advil), Naproxen (Aleve), Vitamins and Herbal Products (ie Fish Oil).   How to Manage Your Diabetes Before and After Surgery  Why is it important to control my blood sugar before and after surgery? . Improving blood sugar levels before and after surgery helps healing and can limit problems. . A way of improving blood sugar control is eating a healthy diet by: o  Eating less sugar and carbohydrates o  Increasing activity/exercise o  Talking with your doctor about reaching your blood sugar goals . High blood sugars (greater than 180 mg/dL) can raise your risk of infections and slow your recovery, so you will need to focus on controlling your diabetes during the weeks  before surgery. . Make sure that the doctor who takes care of your diabetes knows about your planned surgery including the date and location.  How do I manage my blood sugar before surgery? . Check your blood sugar at least 4 times a day, starting 2 days before surgery, to make sure that the level is not too high or low. o Check your blood sugar the morning of your surgery when you wake up and every 2 hours until you get to the Short Stay unit. . If your blood sugar is less than 70 mg/dL, you will need to treat for low blood sugar: o Do not take insulin. o Treat a low blood sugar (less than 70 mg/dL) with  cup of clear juice (cranberry or apple), 4 glucose tablets, OR glucose gel. Recheck blood sugar in 15 minutes after treatment (to make sure it is greater than 70 mg/dL). If your blood sugar is not greater than 70 mg/dL on recheck, call [phone] o  for further instructions. . Report your blood sugar to the short stay nurse when you get to Short Stay.  . If you are admitted to the hospital after surgery: o Your blood sugar will be checked by the staff and you will probably be given insulin after surgery (instead of oral diabetes medicines) to make sure you have good blood sugar levels. o The goal for blood sugar control after surgery is 80-180  mg/dL.   WHAT DO I DO ABOUT MY DIABETES MEDICATION? Do NOT take oral medications for Diabetes the morning of surgery  Reviewed and Endorsed by [facility] Patient Education Committee, August 2015   Do not wear jewelry, make-up or nail polish.  Do not wear lotions, powders, or perfumes, or deodorant.  Do not shave 48 hours prior to surgery.  Men may shave face and neck.  Do not bring valuables to the hospital.  [facility] is not responsible for any belongings or valuables.  Contacts, dentures or bridgework may not be worn into surgery.  Leave your suitcase in the car.  After surgery it may be brought to your room.  For patients admitted to  the hospital, discharge time will be determined by your treatment team.  Patients discharged the day of surgery will not be allowed to drive home.   Special instructions:   [place]- Preparing For Surgery  Before surgery, you can play an important role. Because skin is not sterile, your skin needs to be as free of germs as possible. You can reduce the number of germs on your skin by washing with CHG (chlorahexidine gluconate) Soap before surgery.  CHG is an antiseptic cleaner which kills germs and bonds with the skin to continue killing germs even after washing.    Oral Hygiene is also important to reduce your risk of infection.  Remember - BRUSH YOUR TEETH THE MORNING OF SURGERY WITH YOUR REGULAR TOOTHPASTE  Please do not use if you have an allergy to CHG or antibacterial soaps. If your skin becomes reddened/irritated stop using the CHG.  Do not shave (including legs and underarms) for at least 48 hours prior to first CHG shower. It is OK to shave your face.  Please follow these instructions carefully.   1. Shower the NIGHT BEFORE SURGERY and the MORNING OF SURGERY with CHG.   2. If you chose to wash your hair, wash your hair first as usual with your normal shampoo.  3. After you shampoo, rinse your hair and body thoroughly to remove the shampoo.  4. Use CHG as you would any other liquid soap. You can apply CHG directly to the skin and wash gently with a scrungie or a clean washcloth.   5. Apply the CHG Soap to your body ONLY FROM THE NECK DOWN.  Do not use on open wounds or open sores. Avoid contact with your eyes, ears, mouth and genitals (private parts). Wash Face and genitals (private parts)  with your normal soap.  6. Wash thoroughly, paying special attention to the area where your surgery will be performed.  7. Thoroughly rinse your body with warm water from the neck down.  8. DO NOT shower/wash with your normal soap after using and rinsing off the CHG Soap.  9. Pat yourself  dry with a CLEAN TOWEL.  10. Wear CLEAN PAJAMAS to bed the night before surgery, wear comfortable clothes the morning of surgery  11. Place CLEAN SHEETS on your bed the night of your first shower and DO NOT SLEEP WITH PETS.  Day of Surgery: Shower as instructed above  Do not apply any deodorants/lotions, powders or colognes.  Please wear clean clothes to the hospital/surgery center.   Remember to brush your teeth WITH YOUR REGULAR TOOTHPASTE.  Please read over the following fact sheets that you were given.

## 2018-08-22 ENCOUNTER — Encounter (HOSPITAL_COMMUNITY)
Admission: RE | Admit: 2018-08-22 | Discharge: 2018-08-22 | Disposition: A | Discharge status: Home / Self Care | Payer: Medicare Other | Source: Ambulatory Visit | Attending: Orthopedic Surgery | Admitting: Orthopedic Surgery

## 2018-08-22 ENCOUNTER — Encounter (HOSPITAL_COMMUNITY): Payer: Self-pay

## 2018-08-22 ENCOUNTER — Other Ambulatory Visit: Payer: Self-pay

## 2018-08-22 DIAGNOSIS — Z01812 Encounter for preprocedural laboratory examination: Secondary | ICD-10-CM | POA: Diagnosis not present

## 2018-08-22 LAB — CBC
HCT: 39.2 % (ref 36.0–46.0)
Hemoglobin: 12.2 g/dL (ref 12.0–15.0)
MCH: 29.7 pg (ref 26.0–34.0)
MCHC: 31.1 g/dL (ref 30.0–36.0)
MCV: 95.4 fL (ref 80.0–100.0)
Platelets: 236 10*3/uL (ref 150–400)
RBC: 4.11 MIL/uL (ref 3.87–5.11)
RDW: 12.8 % (ref 11.5–15.5)
WBC: 6.6 10*3/uL (ref 4.0–10.5)
nRBC: 0 % (ref 0.0–0.2)

## 2018-08-22 LAB — BASIC METABOLIC PANEL
ANION GAP: 8 (ref 5–15)
BUN: 13 mg/dL (ref 8–23)
CO2: 27 mmol/L (ref 22–32)
Calcium: 9.3 mg/dL (ref 8.9–10.3)
Chloride: 105 mmol/L (ref 98–111)
Creatinine, Ser: 1.02 mg/dL — ABNORMAL HIGH (ref 0.44–1.00)
GFR calc Af Amer: >60 mL/min (ref >60)
GFR calc non Af Amer: 57 mL/min — ABNORMAL LOW (ref >60)
Glucose, Bld: 84 mg/dL (ref 70–99)
Potassium: 3.8 mmol/L (ref 3.5–5.1)
Sodium: 140 mmol/L (ref 135–145)

## 2018-08-22 LAB — HEMOGLOBIN A1C
Hgb A1c MFr Bld: 5.9 % — ABNORMAL HIGH (ref 4.8–5.6)
Mean Plasma Glucose: 122.63 mg/dL

## 2018-08-22 LAB — URINALYSIS, ROUTINE W REFLEX MICROSCOPIC
Bilirubin Urine: NEGATIVE
GLUCOSE, UA: NEGATIVE mg/dL
Hgb urine dipstick: NEGATIVE
Ketones, ur: NEGATIVE mg/dL
Leukocytes, UA: NEGATIVE
Nitrite: NEGATIVE
Protein, ur: NEGATIVE mg/dL
Specific Gravity, Urine: 1.028 (ref 1.005–1.030)
pH: 5 (ref 5.0–8.0)

## 2018-08-22 LAB — COMPREHENSIVE METABOLIC PANEL
ALT: 23 U/L (ref 0–44)
AST: 21 U/L (ref 15–41)
Albumin: 4.3 g/dL (ref 3.5–5.0)
Alkaline Phosphatase: 61 U/L (ref 38–126)
Anion gap: 8 (ref 5–15)
BUN: 14 mg/dL (ref 8–23)
CO2: 25 mmol/L (ref 22–32)
Calcium: 9.5 mg/dL (ref 8.9–10.3)
Chloride: 106 mmol/L (ref 98–111)
Creatinine, Ser: 1.04 mg/dL — ABNORMAL HIGH (ref 0.44–1.00)
GFR calc Af Amer: >60 mL/min (ref >60)
GFR calc non Af Amer: 56 mL/min — ABNORMAL LOW (ref >60)
GLUCOSE: 82 mg/dL (ref 70–99)
Potassium: 3.8 mmol/L (ref 3.5–5.1)
Sodium: 139 mmol/L (ref 135–145)
Total Bilirubin: 0.5 mg/dL (ref 0.3–1.2)
Total Protein: 7 g/dL (ref 6.5–8.1)

## 2018-08-22 LAB — SURGICAL PCR SCREEN
MRSA, PCR: NEGATIVE
Staphylococcus aureus: NEGATIVE

## 2018-08-22 LAB — GLUCOSE, CAPILLARY: Glucose-Capillary: 87 mg/dL (ref 70–99)

## 2018-08-22 NOTE — Progress Notes (Signed)
PCP - Jenna Luo Cardiologist - McDowell  Chest x-ray - 10/2017 EKG - 10/2017 Stress Test -2019  ECHO - denies Cardiac Cath - denies   Fasting Blood Sugar - doesn't know Checks Blood Sugar ___1__ times a week  instructed patient to stop using mariajuana 1 week prior to surgery   Aspirin Instructions: stop 5 days  Anesthesia review: yes, patient has had recent GI symptoms.  Starting Saturday last week to Tuesday this week.  Vomiting, diarrhea, and weakness.  patient did not seek medical attention  Patient denies shortness of breath, fever, cough and chest pain at PAT appointment   Patient verbalized understanding of instructions that were given to them at the PAT appointment. Patient was also instructed that they will need to review over the PAT instructions again at home before surgery.

## 2018-08-22 NOTE — Pre-Procedure Instructions (Signed)
Deborah Mullins  08/22/2018      [id number] [place], [place] [address] AT [address] [name] [address] Phone: [phone] Fax: [phone]    Your procedure is scheduled on January 14  Report to [place] at [place].  Call this number if you have problems the morning of surgery:  [phone]   Remember:  Do not eat or drink after midnight.   Take these medicines the morning of surgery with A SIP OF WATER  albuterol (PROVENTIL HFA;VENTOLIN HFA)  buPROPion (WELLBUTRIN SR)  cetirizine (ZYRTEC)  FLUoxetine (PROZAC) fluticasone (FLONASE) gabapentin (NEURONTIN) HYDROcodone-acetaminophen (NORCO/VICODIN) pantoprazole (PROTONIX)  7 days prior to surgery STOP taking any Aspirin (unless otherwise instructed by your surgeon), Aleve, Naproxen, Ibuprofen, Motrin, Advil, Goody's, BC's, all herbal medications, fish oil, and all vitamins.  Follow your surgeon's instructions on when to stop Asprin.  If no instructions were given by your surgeon then you will need to call the office to get those instructions.     WHAT DO I DO ABOUT MY DIABETES MEDICATION?   Marland Kitchen Do not take oral diabetes medicines (pills) the morning of surgery. metFORMIN (GLUCOPHAGE)   How to Manage Your Diabetes Before and After Surgery  Why is it important to control my blood sugar before and after surgery? . Improving blood sugar levels before and after surgery helps healing and can limit problems. . A way of improving blood sugar control is eating a healthy diet by: o  Eating less sugar and carbohydrates o  Increasing activity/exercise o  Talking with your doctor about reaching your blood sugar goals . High blood sugars (greater than 180 mg/dL) can raise your risk of infections and slow your recovery, so you will need to focus on controlling your diabetes during the weeks before surgery. . Make sure that the doctor who takes care of your diabetes  knows about your planned surgery including the date and location.  How do I manage my blood sugar before surgery? . Check your blood sugar at least 4 times a day, starting 2 days before surgery, to make sure that the level is not too high or low. o Check your blood sugar the morning of your surgery when you wake up and every 2 hours until you get to the Short Stay unit. . If your blood sugar is less than 70 mg/dL, you will need to treat for low blood sugar: o Do not take insulin. o Treat a low blood sugar (less than 70 mg/dL) with  cup of clear juice (cranberry or apple), 4 glucose tablets, OR glucose gel. o Recheck blood sugar in 15 minutes after treatment (to make sure it is greater than 70 mg/dL). If your blood sugar is not greater than 70 mg/dL on recheck, call [phone] for further instructions. . Report your blood sugar to the short stay nurse when you get to Short Stay.  . If you are admitted to the hospital after surgery: o Your blood sugar will be checked by the staff and you will probably be given insulin after surgery (instead of oral diabetes medicines) to make sure you have good blood sugar levels. o The goal for blood sugar control after surgery is 80-180 mg/dL.    Do not wear jewelry, make-up or nail polish.  Do not wear lotions, powders, or perfumes, or deodorant.  Do not shave 48 hours prior to surgery.    Do not bring valuables to the  hospital.  [facility] is not responsible for any belongings or valuables.  Contacts, dentures or bridgework may not be worn into surgery.  Leave your suitcase in the car.  After surgery it may be brought to your room.  For patients admitted to the hospital, discharge time will be determined by your treatment team.  Patients discharged the day of surgery will not be allowed to drive home.    Special instructions:   - Preparing For Surgery  Before surgery, you can play an important role. Because skin is not sterile, your  skin needs to be as free of germs as possible. You can reduce the number of germs on your skin by washing with CHG (chlorahexidine gluconate) Soap before surgery.  CHG is an antiseptic cleaner which kills germs and bonds with the skin to continue killing germs even after washing.    Oral Hygiene is also important to reduce your risk of infection.  Remember - BRUSH YOUR TEETH THE MORNING OF SURGERY WITH YOUR REGULAR TOOTHPASTE  Please do not use if you have an allergy to CHG or antibacterial soaps. If your skin becomes reddened/irritated stop using the CHG.  Do not shave (including legs and underarms) for at least 48 hours prior to first CHG shower. It is OK to shave your face.  Please follow these instructions carefully.   1. Shower the NIGHT BEFORE SURGERY and the MORNING OF SURGERY with CHG.   2. If you chose to wash your hair, wash your hair first as usual with your normal shampoo.  3. After you shampoo, rinse your hair and body thoroughly to remove the shampoo.  4. Use CHG as you would any other liquid soap. You can apply CHG directly to the skin and wash gently with a scrungie or a clean washcloth.   5. Apply the CHG Soap to your body ONLY FROM THE NECK DOWN.  Do not use on open wounds or open sores. Avoid contact with your eyes, ears, mouth and genitals (private parts). Wash Face and genitals (private parts)  with your normal soap.  6. Wash thoroughly, paying special attention to the area where your surgery will be performed.  7. Thoroughly rinse your body with warm water from the neck down.  8. DO NOT shower/wash with your normal soap after using and rinsing off the CHG Soap.  9. Pat yourself dry with a CLEAN TOWEL.  10. Wear CLEAN PAJAMAS to bed the night before surgery, wear comfortable clothes the morning of surgery  11. Place CLEAN SHEETS on your bed the night of your first shower and DO NOT SLEEP WITH PETS.    Day of Surgery:  Do not apply any deodorants/lotions.   Please wear clean clothes to the hospital/surgery center.   Remember to brush your teeth WITH YOUR REGULAR TOOTHPASTE.    Please read over the following fact sheets that you were given.

## 2018-08-23 LAB — URINE CULTURE: Culture: NO GROWTH

## 2018-08-23 NOTE — Progress Notes (Addendum)
Anesthesia Chart Review:  Case:  [id number] Date/Time:  08/27/18 0715   Procedure:  LEFT TOTAL SHOULDER ARTHROPLASTY (Left )   Anesthesia type:  General   Pre-op diagnosis:  left shoulder osteoarthritis   Location:  [facility] OR ROOM 05 / [place] OR   Surgeon:  Meredith Pel, MD      DISCUSSION: Patient is a 68 year old female scheduled for the above procedure.   History includes former smoker (quit '76), postoperative N/V, HTN, HLD, DM2, GERD, Bipolar 1 disorder,  breast cancer (s/p left lumpectomy '05, radiation), lung cancer (adenocarcinoma, stage 1a,s/p RL lobectomy 05/23/12).  Low risk stress test last month.  Had N/V/D 08/17/18-08/20/18. No fever and reported feeling better at her 08/22/18 PAT visit. HFP added to preoperative labs which were WNL. She is to seek further evaluation if recurrent/worsening symptoms prior to surgery. Urine culture showed no growth.   Based on currently available information, I would anticipate that she can proceed as planned if no acute changed. Reported she is to hold ASA 5 days prior to surgery.    VS: BP (!) 141/80   Pulse 92   Temp 36.8 C   Ht '5\' 3"'$  (1.6 m)   Wt 69.2 kg   SpO2 100%   BMI 27.01 kg/m    PROVIDERS: Susy Frizzle, MD is PCP  Rozann Lesches, MD is cardiologist. He saw patient on 07/10/18 just for preoperative evaluation. Coronary calcifications noted on 06/2016 CT imaging. No chest pain but with chronic exertional dyspnea. Stress test was ordered which was low risk.    LABS: Labs reviewed: Acceptable for surgery.  (all labs ordered are listed, but only abnormal results are displayed)  Labs Reviewed  BASIC METABOLIC PANEL - Abnormal; Notable for the following components:      Result Value   Creatinine, Ser 1.02 (*)    GFR calc non Af Amer 57 (*)    All other components within normal limits  HEMOGLOBIN A1C - Abnormal; Notable for the following components:   Hgb A1c MFr Bld 5.9 (*)    All other components within normal limits   COMPREHENSIVE METABOLIC PANEL - Abnormal; Notable for the following components:   Creatinine, Ser 1.04 (*)    GFR calc non Af Amer 56 (*)    All other components within normal limits  URINE CULTURE  SURGICAL PCR SCREEN  GLUCOSE, CAPILLARY  CBC  URINALYSIS, ROUTINE W REFLEX MICROSCOPIC    Spirometry 06/16/14: FVC 2.47 (93%). FEV1 1.80 (84%). FEF 25-75 1.31 (56%).   IMAGES: CXR 10/13/17: IMPRESSION: Normal chest radiographs.   EKG: 10/13/17: ST at 106 bpm.   CV: Nuclear stress test 07/18/18:  No diagnostic ST segment changes to indicate ischemia.  No significant myocardial perfusion defects to indicate scar or ischemia.  This is a low risk study.  Nuclear stress EF: 82%.   Past Medical History:  Diagnosis Date  . Allergic rhinitis   . Anxiety   . Arthritis   . Bipolar 1 disorder ([place])   . Breast cancer ([place]) 2004  . Carpal tunnel syndrome of left wrist   . Chronic neck pain   . Chronic shoulder pain    L>R  . Depression   . GERD (gastroesophageal reflux disease)   . Headache(784.0)   . History of lung surgery   . Hyperlipidemia   . Hypertension   . Lung cancer ([place])   . Lung nodule 2009  . Pain in both feet   . Personal history of radiation  therapy 2004  . PONV (postoperative nausea and vomiting)   . Type 2 diabetes mellitus ([place])     Past Surgical History:  Procedure Laterality Date  . BREAST SURGERY  2005   lumpectomy - left  . carpel tunnel Bilateral   . CESAREAN SECTION  1971  . COLONOSCOPY WITH PROPOFOL N/A 07/25/2016   Procedure: COLONOSCOPY WITH PROPOFOL;  Surgeon: Danie Binder, MD;  Location: [facility];  Service: Endoscopy;  Laterality: N/A;  8:15 AM  . EYE SURGERY    . LUNG CANCER SURGERY Right 05/23/2012   reportedly clear  . TONSILLECTOMY    . TUBAL LIGATION      MEDICATIONS: . acetaminophen-codeine (TYLENOL #3) 300-30 MG tablet  . albuterol (PROVENTIL HFA;VENTOLIN HFA) 108 (90 Base) MCG/ACT inhaler  . aspirin 81 MG tablet  .  blood glucose meter kit and supplies KIT  . buPROPion (WELLBUTRIN SR) 150 MG 12 hr tablet  . cetirizine (ZYRTEC) 10 MG tablet  . FLUoxetine (PROZAC) 40 MG capsule  . fluticasone (FLONASE) 50 MCG/ACT nasal spray  . gabapentin (NEURONTIN) 800 MG tablet  . glucose blood test strip  . HYDROcodone-acetaminophen (NORCO/VICODIN) 5-325 MG tablet  . Lancets (ONETOUCH ULTRASOFT) lancets  . losartan (COZAAR) 50 MG tablet  . metFORMIN (GLUCOPHAGE) 500 MG tablet  . pantoprazole (PROTONIX) 40 MG tablet  . rosuvastatin (CRESTOR) 40 MG tablet   No current facility-administered medications for this encounter.     Myra Gianotti, PA-C Surgical Short Stay/Anesthesiology [facility] Phone [phone] [facility] Phone [phone] 08/23/2018 1:13 PM

## 2018-08-23 NOTE — Anesthesia Preprocedure Evaluation (Addendum)
Anesthesia Evaluation  Patient identified by MRN, date of birth, ID band Patient awake    Reviewed: Allergy & Precautions, H&P , NPO status , Patient's Chart, lab work & pertinent test results  History of Anesthesia Complications (+) PONV and history of anesthetic complications  Airway Mallampati: II   Neck ROM: full    Dental   Pulmonary COPD, former smoker,  Lung CA s/p RLL lobectomy   breath sounds clear to auscultation       Cardiovascular hypertension,  Rhythm:regular Rate:Normal     Neuro/Psych  Headaches, PSYCHIATRIC DISORDERS Anxiety Depression Bipolar Disorder  Neuromuscular disease    GI/Hepatic GERD  ,  Endo/Other  diabetes, Type 2  Renal/GU      Musculoskeletal  (+) Arthritis ,   Abdominal   Peds  Hematology   Anesthesia Other Findings   Reproductive/Obstetrics                            Anesthesia Physical Anesthesia Plan  ASA: III  Anesthesia Plan: General   Post-op Pain Management:  Regional for Post-op pain   Induction: Intravenous  PONV Risk Score and Plan: 4 or greater and Ondansetron, Dexamethasone and Treatment may vary due to age or medical condition  Airway Management Planned: Oral ETT  Additional Equipment:   Intra-op Plan:   Post-operative Plan: Extubation in OR  Informed Consent: I have reviewed the patients History and Physical, chart, labs and discussed the procedure including the risks, benefits and alternatives for the proposed anesthesia with the patient or authorized representative who has indicated his/her understanding and acceptance.     Plan Discussed with: CRNA, Anesthesiologist and Surgeon  Anesthesia Plan Comments: (PAT note written 08/23/2018 by Myra Gianotti, PA-C. )       Anesthesia Quick Evaluation

## 2018-08-26 NOTE — H&P (Signed)
Deborah Mullins is an 68 y.o. female.   Chief Complaint: Left shoulder pain HPI: Deborah Mullins is a 68 year old patient with left shoulder pain.  She has end-stage arthritis in both shoulders left worse than right.  Does have a history of breast cancer treatment with radiation on that side.  She reports functional limitation with both shoulders as well.  Past Medical History:  Diagnosis Date  . Allergic rhinitis   . Anxiety   . Arthritis   . Bipolar 1 disorder ([place])   . Breast cancer ([place]) 2004  . Carpal tunnel syndrome of left wrist   . Chronic neck pain   . Chronic shoulder pain    L>R  . Depression   . GERD (gastroesophageal reflux disease)   . Headache(784.0)   . History of lung surgery   . Hyperlipidemia   . Hypertension   . Lung cancer ([place])   . Lung nodule 2009  . Pain in both feet   . Personal history of radiation therapy 2004  . PONV (postoperative nausea and vomiting)   . Type 2 diabetes mellitus ([place])     Past Surgical History:  Procedure Laterality Date  . BREAST SURGERY  2005   lumpectomy - left  . carpel tunnel Bilateral   . CESAREAN SECTION  1971  . COLONOSCOPY WITH PROPOFOL N/A 07/25/2016   Procedure: COLONOSCOPY WITH PROPOFOL;  Surgeon: Danie Binder, MD;  Location: [facility];  Service: Endoscopy;  Laterality: N/A;  8:15 AM  . EYE SURGERY    . LUNG CANCER SURGERY Right 05/23/2012   reportedly clear  . TONSILLECTOMY    . TUBAL LIGATION      Family History  Problem Relation Age of Onset  . Heart disease Mother        CHF  . Physical abuse Mother   . Depression Mother   . Cancer Sister   . ADD / ADHD Sister   . Alcohol abuse Father   . Alcohol abuse Brother   . Mental illness Brother   . Cancer Sister   . Heart disease Sister   . Physical abuse Sister   . OCD Sister   . Mental illness Sister   . Anxiety disorder Neg Hx   . Bipolar disorder Neg Hx   . Dementia Neg Hx   . Drug abuse Neg Hx   . Paranoid behavior Neg Hx   . Schizophrenia Neg Hx    . Seizures Neg Hx   . Sexual abuse Neg Hx    Social History:  reports that she quit smoking about 44 years ago. Her smoking use included cigarettes. She has a 10.00 pack-year smoking history. She has never used smokeless tobacco. She reports current alcohol use. She reports current drug use. Drug: Marijuana.  Allergies:  Allergies  Allergen Reactions  . Bee Venom Anaphylaxis  . Penicillins Rash and Other (See Comments)    PATIENT HAS HAD A PCN REACTION WITH IMMEDIATE RASH, FACIAL/TONGUE/THROAT SWELLING, SOB, OR LIGHTHEADEDNESS WITH HYPOTENSION:  #  #  YES  #  #  Has patient had a PCN reaction causing severe rash involving mucus membranes or skin necrosis: No Has patient had a PCN reaction that required hospitalization No Has patient had a PCN reaction occurring within the last 10 years: No      No medications prior to admission.    No results found for this or any previous visit (from the past 48 hour(s)). No results found.  Review of Systems  Musculoskeletal:  Positive for joint pain.  All other systems reviewed and are negative.   There were no vitals taken for this visit. Physical Exam  Constitutional: She appears well-developed.  HENT:  Head: Normocephalic.  Eyes: Pupils are equal, round, and reactive to light.  Neck: Normal range of motion.  Cardiovascular: Normal rate.  Respiratory: Effort normal.  Neurological: She is alert.  Skin: Skin is warm.  Psychiatric: She has a normal mood and affect.  Examination of the shoulder demonstrates functional deltoid.  Forward flexion and abduction both below 90 degrees.  Coarseness and grinding with passive range of motion is present.  External rotation also limited to about 20 degrees.  No masses lymphadenopathy or skin changes noted in that shoulder girdle region.  Motor or sensory function in the left hand is intact Assessment/Plan Impression is left shoulder arthritis in a 69 year old patient with history of breast cancer.   Plan is left shoulder replacement.  This may be anatomic shoulder replacement versus reverse shoulder replacement.  She is in the gray zone as far as her age goes.  If we do go with the anatomic shoulder replacement I would use convertible glenoid.  Reverse shoulder replacement may also be indicated if her rotator cuff looks at all suspect.  The risk and benefits of the procedure are discussed with the patient including not limited to infection nerve vessel damage dislocation potential need for revision surgery which we would try to avoid in her case as well as incomplete pain relief and incomplete restoration of function.  All questions answered.  Anderson Malta, MD 08/26/2018, 6:11 PM

## 2018-08-27 ENCOUNTER — Inpatient Hospital Stay (HOSPITAL_COMMUNITY): Payer: Medicare Other | Admitting: Vascular Surgery

## 2018-08-27 ENCOUNTER — Inpatient Hospital Stay (HOSPITAL_COMMUNITY): Payer: Medicare Other | Admitting: Anesthesiology

## 2018-08-27 ENCOUNTER — Encounter (HOSPITAL_COMMUNITY)
Admission: RE | Disposition: A | Discharge status: Home / Self Care | Payer: Self-pay | Source: Home / Self Care | Attending: Orthopedic Surgery

## 2018-08-27 ENCOUNTER — Encounter (HOSPITAL_COMMUNITY): Payer: Self-pay

## 2018-08-27 ENCOUNTER — Inpatient Hospital Stay (HOSPITAL_COMMUNITY): Payer: Medicare Other

## 2018-08-27 ENCOUNTER — Inpatient Hospital Stay (HOSPITAL_COMMUNITY)
Admission: RE | Admit: 2018-08-27 | Discharge: 2018-08-28 | DRG: 483 | Disposition: A | Discharge status: Home / Self Care | Payer: Medicare Other | Attending: Orthopedic Surgery | Admitting: Orthopedic Surgery

## 2018-08-27 DIAGNOSIS — M19012 Primary osteoarthritis, left shoulder: Secondary | ICD-10-CM

## 2018-08-27 DIAGNOSIS — Z809 Family history of malignant neoplasm, unspecified: Secondary | ICD-10-CM

## 2018-08-27 DIAGNOSIS — G8929 Other chronic pain: Secondary | ICD-10-CM | POA: Diagnosis present

## 2018-08-27 DIAGNOSIS — Z91030 Bee allergy status: Secondary | ICD-10-CM

## 2018-08-27 DIAGNOSIS — Z85118 Personal history of other malignant neoplasm of bronchus and lung: Secondary | ICD-10-CM

## 2018-08-27 DIAGNOSIS — Z811 Family history of alcohol abuse and dependence: Secondary | ICD-10-CM

## 2018-08-27 DIAGNOSIS — M25712 Osteophyte, left shoulder: Secondary | ICD-10-CM | POA: Diagnosis present

## 2018-08-27 DIAGNOSIS — K219 Gastro-esophageal reflux disease without esophagitis: Secondary | ICD-10-CM | POA: Diagnosis present

## 2018-08-27 DIAGNOSIS — E785 Hyperlipidemia, unspecified: Secondary | ICD-10-CM | POA: Diagnosis present

## 2018-08-27 DIAGNOSIS — F419 Anxiety disorder, unspecified: Secondary | ICD-10-CM | POA: Diagnosis present

## 2018-08-27 DIAGNOSIS — Z8249 Family history of ischemic heart disease and other diseases of the circulatory system: Secondary | ICD-10-CM | POA: Diagnosis not present

## 2018-08-27 DIAGNOSIS — Z880 Allergy status to penicillin: Secondary | ICD-10-CM | POA: Diagnosis not present

## 2018-08-27 DIAGNOSIS — M19019 Primary osteoarthritis, unspecified shoulder: Secondary | ICD-10-CM | POA: Diagnosis present

## 2018-08-27 DIAGNOSIS — Z853 Personal history of malignant neoplasm of breast: Secondary | ICD-10-CM

## 2018-08-27 DIAGNOSIS — Z471 Aftercare following joint replacement surgery: Secondary | ICD-10-CM | POA: Diagnosis not present

## 2018-08-27 DIAGNOSIS — I10 Essential (primary) hypertension: Secondary | ICD-10-CM | POA: Diagnosis present

## 2018-08-27 DIAGNOSIS — G8918 Other acute postprocedural pain: Secondary | ICD-10-CM | POA: Diagnosis not present

## 2018-08-27 DIAGNOSIS — Z923 Personal history of irradiation: Secondary | ICD-10-CM | POA: Diagnosis not present

## 2018-08-27 DIAGNOSIS — F319 Bipolar disorder, unspecified: Secondary | ICD-10-CM | POA: Diagnosis present

## 2018-08-27 DIAGNOSIS — Z87891 Personal history of nicotine dependence: Secondary | ICD-10-CM

## 2018-08-27 DIAGNOSIS — E119 Type 2 diabetes mellitus without complications: Secondary | ICD-10-CM | POA: Diagnosis present

## 2018-08-27 DIAGNOSIS — Z818 Family history of other mental and behavioral disorders: Secondary | ICD-10-CM | POA: Diagnosis not present

## 2018-08-27 DIAGNOSIS — Z902 Acquired absence of lung [part of]: Secondary | ICD-10-CM | POA: Diagnosis not present

## 2018-08-27 DIAGNOSIS — Z96612 Presence of left artificial shoulder joint: Secondary | ICD-10-CM | POA: Diagnosis not present

## 2018-08-27 DIAGNOSIS — M19011 Primary osteoarthritis, right shoulder: Secondary | ICD-10-CM | POA: Diagnosis not present

## 2018-08-27 HISTORY — PX: TOTAL SHOULDER ARTHROPLASTY: SHX126

## 2018-08-27 LAB — GLUCOSE, CAPILLARY
Glucose-Capillary: 89 mg/dL (ref 70–99)
Glucose-Capillary: 103 mg/dL — ABNORMAL HIGH (ref 70–99)
Glucose-Capillary: 159 mg/dL — ABNORMAL HIGH (ref 70–99)
Glucose-Capillary: 129 mg/dL — ABNORMAL HIGH (ref 70–99)

## 2018-08-27 SURGERY — ARTHROPLASTY, SHOULDER, TOTAL
Anesthesia: General | Site: Shoulder | Laterality: Left

## 2018-08-27 MED ORDER — ACETAMINOPHEN 325 MG PO TABS: 325.0000 mg | ORAL_TABLET | Freq: Four times a day (QID) | ORAL | Status: DC | PRN

## 2018-08-27 MED ORDER — VANCOMYCIN HCL IN DEXTROSE 1-5 GM/200ML-% IV SOLN
1000.0000 mg | Freq: Two times a day (BID) | INTRAVENOUS | Status: AC
  Administered 2018-08-27: 1000 mg via INTRAVENOUS
  Filled 2018-08-27: qty 200

## 2018-08-27 MED ORDER — 0.9 % SODIUM CHLORIDE (POUR BTL) OPTIME
TOPICAL | Status: DC | PRN
  Administered 2018-08-27 (×6): 1000 mL

## 2018-08-27 MED ORDER — OXYCODONE HCL 5 MG PO TABS
5.0000 mg | ORAL_TABLET | ORAL | Status: DC | PRN
  Administered 2018-08-27 – 2018-08-28 (×3): 10 mg via ORAL
  Filled 2018-08-27 (×3): qty 2

## 2018-08-27 MED ORDER — OXYCODONE HCL 5 MG/5ML PO SOLN: 5.0000 mg | Freq: Once | ORAL | Status: DC | PRN

## 2018-08-27 MED ORDER — METHOCARBAMOL 500 MG PO TABS
500.0000 mg | ORAL_TABLET | Freq: Four times a day (QID) | ORAL | Status: DC | PRN
  Administered 2018-08-28: 500 mg via ORAL
  Filled 2018-08-27: qty 1

## 2018-08-27 MED ORDER — ONDANSETRON HCL 4 MG/2ML IJ SOLN: 4.0000 mg | Freq: Four times a day (QID) | INTRAMUSCULAR | Status: DC | PRN

## 2018-08-27 MED ORDER — ROCURONIUM BROMIDE 10 MG/ML (PF) SYRINGE
PREFILLED_SYRINGE | INTRAVENOUS | Status: DC | PRN
  Administered 2018-08-27: 50 mg via INTRAVENOUS

## 2018-08-27 MED ORDER — MENTHOL 3 MG MT LOZG
1.0000 | LOZENGE | OROMUCOSAL | Status: DC | PRN
  Administered 2018-08-27: 3 mg via ORAL
  Filled 2018-08-27: qty 9

## 2018-08-27 MED ORDER — CHLORHEXIDINE GLUCONATE 4 % EX LIQD: 60.0000 mL | Freq: Once | CUTANEOUS | Status: DC

## 2018-08-27 MED ORDER — MIDAZOLAM HCL 2 MG/2ML IJ SOLN
INTRAMUSCULAR | Status: AC
  Filled 2018-08-27: qty 2

## 2018-08-27 MED ORDER — LOSARTAN POTASSIUM 50 MG PO TABS
50.0000 mg | ORAL_TABLET | Freq: Every day | ORAL | Status: DC
  Filled 2018-08-27: qty 1

## 2018-08-27 MED ORDER — PHENOL 1.4 % MT LIQD: 1.0000 | OROMUCOSAL | Status: DC | PRN

## 2018-08-27 MED ORDER — GABAPENTIN 400 MG PO CAPS
800.0000 mg | ORAL_CAPSULE | Freq: Two times a day (BID) | ORAL | Status: DC
  Administered 2018-08-27: 800 mg via ORAL
  Filled 2018-08-27: qty 2

## 2018-08-27 MED ORDER — LORATADINE 10 MG PO TABS
10.0000 mg | ORAL_TABLET | Freq: Every day | ORAL | Status: DC
  Filled 2018-08-27: qty 1

## 2018-08-27 MED ORDER — FENTANYL CITRATE (PF) 100 MCG/2ML IJ SOLN: 25.0000 ug | INTRAMUSCULAR | Status: DC | PRN

## 2018-08-27 MED ORDER — DEXAMETHASONE SODIUM PHOSPHATE 10 MG/ML IJ SOLN
INTRAMUSCULAR | Status: DC | PRN
  Administered 2018-08-27: 5 mg via INTRAVENOUS

## 2018-08-27 MED ORDER — PANTOPRAZOLE SODIUM 40 MG PO TBEC
40.0000 mg | DELAYED_RELEASE_TABLET | Freq: Every day | ORAL | Status: DC
  Filled 2018-08-27: qty 1

## 2018-08-27 MED ORDER — BUPIVACAINE HCL (PF) 0.5 % IJ SOLN
INTRAMUSCULAR | Status: DC | PRN
  Administered 2018-08-27: 15 mL via PERINEURAL

## 2018-08-27 MED ORDER — FLUOXETINE HCL 20 MG PO CAPS
40.0000 mg | ORAL_CAPSULE | Freq: Every day | ORAL | Status: DC
  Filled 2018-08-27: qty 2

## 2018-08-27 MED ORDER — METOCLOPRAMIDE HCL 5 MG/ML IJ SOLN: 5.0000 mg | Freq: Three times a day (TID) | INTRAMUSCULAR | Status: DC | PRN

## 2018-08-27 MED ORDER — LIDOCAINE 2% (20 MG/ML) 5 ML SYRINGE
INTRAMUSCULAR | Status: DC | PRN
  Administered 2018-08-27: 30 mg via INTRAVENOUS

## 2018-08-27 MED ORDER — ALBUMIN HUMAN 5 % IV SOLN
INTRAVENOUS | Status: DC | PRN
  Administered 2018-08-27: 10:00:00 via INTRAVENOUS

## 2018-08-27 MED ORDER — PROPOFOL 10 MG/ML IV BOLUS
INTRAVENOUS | Status: DC | PRN
  Administered 2018-08-27: 40 mg via INTRAVENOUS
  Administered 2018-08-27: 160 mg via INTRAVENOUS

## 2018-08-27 MED ORDER — VANCOMYCIN HCL 1000 MG IV SOLR
INTRAVENOUS | Status: DC | PRN
  Administered 2018-08-27: 1000 mg via TOPICAL

## 2018-08-27 MED ORDER — DOCUSATE SODIUM 100 MG PO CAPS
100.0000 mg | ORAL_CAPSULE | Freq: Two times a day (BID) | ORAL | Status: DC
  Administered 2018-08-27 (×2): 100 mg via ORAL
  Filled 2018-08-27 (×2): qty 1

## 2018-08-27 MED ORDER — METFORMIN HCL 500 MG PO TABS
500.0000 mg | ORAL_TABLET | Freq: Two times a day (BID) | ORAL | Status: DC
  Administered 2018-08-27 – 2018-08-28 (×2): 500 mg via ORAL
  Filled 2018-08-27 (×2): qty 1

## 2018-08-27 MED ORDER — ONDANSETRON HCL 4 MG/2ML IJ SOLN
INTRAMUSCULAR | Status: DC | PRN
  Administered 2018-08-27: 4 mg via INTRAVENOUS

## 2018-08-27 MED ORDER — METHOCARBAMOL 1000 MG/10ML IJ SOLN
500.0000 mg | Freq: Four times a day (QID) | INTRAVENOUS | Status: DC | PRN
  Filled 2018-08-27: qty 5

## 2018-08-27 MED ORDER — BUPROPION HCL ER (SR) 150 MG PO TB12
150.0000 mg | ORAL_TABLET | Freq: Two times a day (BID) | ORAL | Status: DC
  Administered 2018-08-27: 150 mg via ORAL
  Filled 2018-08-27 (×2): qty 1

## 2018-08-27 MED ORDER — LACTATED RINGERS IV SOLN: INTRAVENOUS | Status: DC

## 2018-08-27 MED ORDER — FENTANYL CITRATE (PF) 250 MCG/5ML IJ SOLN
INTRAMUSCULAR | Status: DC | PRN
  Administered 2018-08-27: 50 ug via INTRAVENOUS

## 2018-08-27 MED ORDER — FENTANYL CITRATE (PF) 250 MCG/5ML IJ SOLN
INTRAMUSCULAR | Status: AC
  Filled 2018-08-27: qty 5

## 2018-08-27 MED ORDER — PROPOFOL 10 MG/ML IV BOLUS
INTRAVENOUS | Status: AC
  Filled 2018-08-27: qty 20

## 2018-08-27 MED ORDER — BUPIVACAINE-EPINEPHRINE (PF) 0.25% -1:200000 IJ SOLN
INTRAMUSCULAR | Status: AC
  Filled 2018-08-27: qty 30

## 2018-08-27 MED ORDER — VANCOMYCIN HCL 1000 MG IV SOLR
INTRAVENOUS | Status: AC
  Filled 2018-08-27: qty 1000

## 2018-08-27 MED ORDER — BUPIVACAINE HCL (PF) 0.25 % IJ SOLN
INTRAMUSCULAR | Status: AC
  Filled 2018-08-27: qty 30

## 2018-08-27 MED ORDER — SUGAMMADEX SODIUM 200 MG/2ML IV SOLN
INTRAVENOUS | Status: DC | PRN
  Administered 2018-08-27: 137.8 mg via INTRAVENOUS

## 2018-08-27 MED ORDER — ROSUVASTATIN CALCIUM 20 MG PO TABS: 40.0000 mg | ORAL_TABLET | Freq: Every day | ORAL | Status: DC

## 2018-08-27 MED ORDER — ONDANSETRON HCL 4 MG PO TABS: 4.0000 mg | ORAL_TABLET | Freq: Four times a day (QID) | ORAL | Status: DC | PRN

## 2018-08-27 MED ORDER — LACTATED RINGERS IV SOLN
INTRAVENOUS | Status: DC | PRN
  Administered 2018-08-27 (×2): via INTRAVENOUS

## 2018-08-27 MED ORDER — VANCOMYCIN HCL IN DEXTROSE 1-5 GM/200ML-% IV SOLN
1000.0000 mg | INTRAVENOUS | Status: AC
  Administered 2018-08-27: 1000 mg via INTRAVENOUS
  Filled 2018-08-27: qty 200

## 2018-08-27 MED ORDER — GABAPENTIN 800 MG PO TABS
800.0000 mg | ORAL_TABLET | Freq: Two times a day (BID) | ORAL | Status: DC
  Filled 2018-08-27 (×2): qty 1

## 2018-08-27 MED ORDER — METOCLOPRAMIDE HCL 5 MG PO TABS: 5.0000 mg | ORAL_TABLET | Freq: Three times a day (TID) | ORAL | Status: DC | PRN

## 2018-08-27 MED ORDER — ASPIRIN 81 MG PO CHEW
81.0000 mg | CHEWABLE_TABLET | Freq: Every day | ORAL | Status: DC
  Filled 2018-08-27: qty 1

## 2018-08-27 MED ORDER — OXYCODONE HCL 5 MG PO TABS: 5.0000 mg | ORAL_TABLET | Freq: Once | ORAL | Status: DC | PRN

## 2018-08-27 MED ORDER — DEXAMETHASONE SODIUM PHOSPHATE 10 MG/ML IJ SOLN
INTRAMUSCULAR | Status: AC
  Filled 2018-08-27: qty 1

## 2018-08-27 MED ORDER — TRAMADOL HCL 50 MG PO TABS
50.0000 mg | ORAL_TABLET | Freq: Four times a day (QID) | ORAL | Status: DC
  Filled 2018-08-27: qty 1

## 2018-08-27 MED ORDER — FLUTICASONE PROPIONATE 50 MCG/ACT NA SUSP
2.0000 | Freq: Every day | NASAL | Status: DC | PRN
  Filled 2018-08-27: qty 16

## 2018-08-27 MED ORDER — ALBUTEROL SULFATE (2.5 MG/3ML) 0.083% IN NEBU: 3.0000 mL | INHALATION_SOLUTION | RESPIRATORY_TRACT | Status: DC | PRN

## 2018-08-27 MED ORDER — HYDROMORPHONE HCL 1 MG/ML IJ SOLN: 0.5000 mg | INTRAMUSCULAR | Status: DC | PRN

## 2018-08-27 MED ORDER — BUPIVACAINE LIPOSOME 1.3 % IJ SUSP
INTRAMUSCULAR | Status: DC | PRN
  Administered 2018-08-27: 10 mL via PERINEURAL

## 2018-08-27 MED ORDER — SODIUM CHLORIDE 0.9 % IV SOLN
INTRAVENOUS | Status: DC | PRN
  Administered 2018-08-27: 20 ug/min via INTRAVENOUS

## 2018-08-27 MED ORDER — MIDAZOLAM HCL 5 MG/5ML IJ SOLN
INTRAMUSCULAR | Status: DC | PRN
  Administered 2018-08-27: 2 mg via INTRAVENOUS

## 2018-08-27 SURGICAL SUPPLY — 78 items
AID PSTN UNV HD RSTRNT DISP (MISCELLANEOUS) ×1
BASEPLATE GLENOSPHERE 25 (Plate) ×1 IMPLANT
BEARING HUMERAL SHLDER 36M STD (Shoulder) IMPLANT
BIT DRILL TWIST 2.7 (BIT) ×1 IMPLANT
BLADE SAW SGTL 13X75X1.27 (BLADE) ×2 IMPLANT
BRNG HUM STD 36 RVRS SHLDR (Shoulder) ×1 IMPLANT
CHLORAPREP W/TINT 26ML (MISCELLANEOUS) ×4 IMPLANT
COVER SURGICAL LIGHT HANDLE (MISCELLANEOUS) ×2 IMPLANT
COVER WAND RF STERILE (DRAPES) ×2 IMPLANT
DRAPE INCISE IOBAN 66X45 STRL (DRAPES) ×2 IMPLANT
DRAPE U-SHAPE 47X51 STRL (DRAPES) ×4 IMPLANT
DRSG AQUACEL AG ADV 3.5X10 (GAUZE/BANDAGES/DRESSINGS) ×2 IMPLANT
ELECT BLADE 4.0 EZ CLEAN MEGAD (MISCELLANEOUS)
ELECT REM PT RETURN 9FT ADLT (ELECTROSURGICAL) ×2
ELECTRODE BLDE 4.0 EZ CLN MEGD (MISCELLANEOUS) IMPLANT
ELECTRODE REM PT RTRN 9FT ADLT (ELECTROSURGICAL) ×1 IMPLANT
GAUZE SPONGE 4X4 12PLY STRL LF (GAUZE/BANDAGES/DRESSINGS) ×2 IMPLANT
GLENOID SPHERE STD STRL 36MM (Orthopedic Implant) ×1 IMPLANT
GLOVE BIOGEL PI IND STRL 7.5 (GLOVE) ×1 IMPLANT
GLOVE BIOGEL PI IND STRL 8 (GLOVE) ×1 IMPLANT
GLOVE BIOGEL PI INDICATOR 7.5 (GLOVE) ×1
GLOVE BIOGEL PI INDICATOR 8 (GLOVE) ×1
GLOVE ECLIPSE 7.0 STRL STRAW (GLOVE) ×3 IMPLANT
GLOVE SURG ORTHO 8.0 STRL STRW (GLOVE) ×2 IMPLANT
GOWN STRL REUS W/ TWL LRG LVL3 (GOWN DISPOSABLE) ×2 IMPLANT
GOWN STRL REUS W/ TWL XL LVL3 (GOWN DISPOSABLE) ×1 IMPLANT
GOWN STRL REUS W/TWL LRG LVL3 (GOWN DISPOSABLE) ×4
GOWN STRL REUS W/TWL XL LVL3 (GOWN DISPOSABLE) ×2
KIT BASIN OR (CUSTOM PROCEDURE TRAY) ×2 IMPLANT
KIT TURNOVER KIT B (KITS) ×2 IMPLANT
LOOP VESSEL MAXI BLUE (MISCELLANEOUS) ×1 IMPLANT
MANIFOLD NEPTUNE II (INSTRUMENTS) ×2 IMPLANT
MODEL GLENOID TOTAL SIGNATURE (SYSTAGENIX WOUND MANAGEMENT) ×1 IMPLANT
NDL HYPO 25GX1X1/2 BEV (NEEDLE) IMPLANT
NDL SUT 6 .5 CRC .975X.05 MAYO (NEEDLE) ×1 IMPLANT
NEEDLE HYPO 25GX1X1/2 BEV (NEEDLE) IMPLANT
NEEDLE MAYO TAPER (NEEDLE) ×2
NS IRRIG 1000ML POUR BTL (IV SOLUTION) ×8 IMPLANT
PACK SHOULDER (CUSTOM PROCEDURE TRAY) ×2 IMPLANT
PAD ARMBOARD 7.5X6 YLW CONV (MISCELLANEOUS) ×4 IMPLANT
PIN THREADED REVERSE (PIN) ×1 IMPLANT
RESTRAINT HEAD UNIVERSAL NS (MISCELLANEOUS) ×2 IMPLANT
RETRIEVER SUT HEWSON (MISCELLANEOUS) ×2 IMPLANT
SCREW BONE LOCKING 4.75X30X3.5 (Screw) ×1 IMPLANT
SCREW BONE LOCKING 4.75X35X3.5 (Screw) ×1 IMPLANT
SCREW BONE STRL 6.5MMX25MM (Screw) ×1 IMPLANT
SCREW CENTRAL 6.5X20MM (Screw) ×1 IMPLANT
SCREW LOCKING 4.75MMX15MM (Screw) ×1 IMPLANT
SCREW LOCKING NS 4.75MMX20MM (Screw) ×1 IMPLANT
SHOULDER HUMERAL BEAR 36M STD (Shoulder) ×2 IMPLANT
SLING ARM FOAM STRAP LRG (SOFTGOODS) ×1 IMPLANT
SLING ARM IMMOBILIZER LRG (SOFTGOODS) ×2 IMPLANT
SPONGE LAP 18X18 X RAY DECT (DISPOSABLE) ×2 IMPLANT
STEM HUMERAL STRL 15MMX140MM (Stem) ×1 IMPLANT
STRIP CLOSURE SKIN 1/2X4 (GAUZE/BANDAGES/DRESSINGS) ×3 IMPLANT
SUCTION FRAZIER HANDLE 10FR (MISCELLANEOUS) ×1
SUCTION TUBE FRAZIER 10FR DISP (MISCELLANEOUS) ×1 IMPLANT
SUT BROADBAND TAPE 2PK 1.5 (SUTURE) ×2 IMPLANT
SUT FIBERWIRE #2 38 T-5 BLUE (SUTURE)
SUT MAXBRAID (SUTURE) IMPLANT
SUT MNCRL AB 3-0 PS2 18 (SUTURE) ×2 IMPLANT
SUT VIC AB 0 CT1 27 (SUTURE) ×4
SUT VIC AB 0 CT1 27XBRD ANBCTR (SUTURE) ×2 IMPLANT
SUT VIC AB 1 CT1 27 (SUTURE) ×2
SUT VIC AB 1 CT1 27XBRD ANBCTR (SUTURE) ×1 IMPLANT
SUT VIC AB 2-0 CT1 27 (SUTURE) ×4
SUT VIC AB 2-0 CT1 TAPERPNT 27 (SUTURE) ×2 IMPLANT
SUT VICRYL 0 UR6 27IN ABS (SUTURE) ×8 IMPLANT
SUTURE FIBERWR #2 38 T-5 BLUE (SUTURE) ×1 IMPLANT
SUTURE TAPE 1.3 40 TPR END (SUTURE) IMPLANT
SUTURETAPE 1.3 40 TPR END (SUTURE)
SYR CONTROL 10ML LL (SYRINGE) IMPLANT
TOWEL OR 17X24 6PK STRL BLUE (TOWEL DISPOSABLE) ×2 IMPLANT
TOWEL OR 17X26 10 PK STRL BLUE (TOWEL DISPOSABLE) ×2 IMPLANT
TRAY FOLEY BAG SILVER LF 16FR (CATHETERS) IMPLANT
TRAY HUM REV SHOULDER STD +6 (Shoulder) ×1 IMPLANT
WATER STERILE IRR 1000ML POUR (IV SOLUTION) ×2 IMPLANT
YANKAUER SUCT BULB TIP NO VENT (SUCTIONS) ×1 IMPLANT

## 2018-08-27 NOTE — Anesthesia Procedure Notes (Signed)
Anesthesia Regional Block: Interscalene brachial plexus block   Pre-Anesthetic Checklist: ,, timeout performed, Correct Patient, Correct Site, Correct Laterality, Correct Procedure, Correct Position, site marked, Risks and benefits discussed,  Surgical consent,  Pre-op evaluation,  At surgeon's request and post-op pain management  Laterality: Left  Prep: chloraprep       Needles:  Injection technique: Single-shot  Needle Type: Echogenic Stimulator Needle     Needle Length: 5cm  Needle Gauge: 22     Additional Needles:   Procedures:, nerve stimulator,,,,,,,   Nerve Stimulator or Paresthesia:  Response: biceps flexion, 0.45 mA,   Additional Responses:   Narrative:  Start time: 08/27/2018 7:02 AM End time: 08/27/2018 7:09 AM Injection made incrementally with aspirations every 5 mL.  Performed by: Personally  Anesthesiologist: Albertha Ghee, MD  Additional Notes: Functioning IV was confirmed and monitors were applied.  A 14mm 22ga Arrow echogenic stimulator needle was used. Sterile prep and drape,hand hygiene and sterile gloves were used.  Negative aspiration and negative test dose prior to incremental administration of local anesthetic. The patient tolerated the procedure well.  Ultrasound guidance: relevent anatomy identified, needle position confirmed, local anesthetic spread visualized around nerve(s), vascular puncture avoided.  Image printed for medical record.

## 2018-08-27 NOTE — Op Note (Signed)
NAMETEANDRA, HARLAN MEDICAL RECORD BZ:[id number] ACCOUNT [id number] DATE OF BIRTH:June 27, 1951 FACILITY: [facility] LOCATION: [facility] PHYSICIAN:Keyanni Whittinghill Randel Pigg, MD  OPERATIVE REPORT  DATE OF PROCEDURE:  08/27/2018  PREOPERATIVE DIAGNOSIS:  Left shoulder arthritis.  POSTOPERATIVE DIAGNOSIS:  Left shoulder arthritis.  PROCEDURE:  Left reverse shoulder replacement utilizing [employer] comprehensive reverse shoulder mini-glenosphere baseplate 25 mm with one 25 mm central screw and 4 peripheral locking screws with 36 mm standard glenosphere micro length, 15 mm x 55 mm porous  plasma shoulder stem, and standard 36 mm diameter highly cross-linked polyethylene bearing with mini-humeral tray standard thickness +6 mm taper offset, 40 mm in diameter.  SURGEON:  Meredith Pel, MD  ASSISTANT:  Laure Kidney, RNFA  INDICATIONS:  The patient is a 68 year old patient with medical issues including history of prior partial lung resection and radiation into the left upper quadrant for cancer.  She presents now after a long history of shoulder arthritis and failure of  conservative measures.  PROCEDURE IN DETAIL:  The patient was brought to the operating room where general anesthetic was induced.  Preoperative antibiotics were administered.  Time-out was called.  The patient's head was in neutral position.  Left shoulder, arm and hand were  prescrubbed first with hydrogen peroxide and alcohol and Betadine, which was allowed to air dry, then prepped with ChloraPrep solution.  The axilla and the entire operative field were covered with Ioban.  Time-out was called.  Incision was made just  superior to the coracoid process, extending proximally.  Skin and subcutaneous tissue were sharply divided.  Cephalic vein was mobilized laterally.  The deltoid was mobilized digitally off of the humeral head.  The patient had to begin with about 70  degrees of forward flexion, 70 degrees of abduction, and about 15-20 degrees of  external rotation.  The deltoid was elevated as a sleeve, its anterior half distally.  The biceps tendon was then tenodesed to the proximal aspect of the pec after  visualization.  A Cobell retractor was placed.  At this time, the circumflex vessels were ligated.  Rotator interval was opened.  Coracohumeral ligament was released from the base of the coracoid.  The patient had some fatty infiltration of the subscap.   Also, the supraspinatus was intact but pretty diminutive at its attachment anteriorly.  For this reason and based on the patient's overall general medical condition and general age, which looks more like she is in the 16s as opposed to the 6s, it was  elected to proceed with reverse shoulder replacement as opposed to anatomic shoulder replacement.  Bone quality was also somewhat marginal which also facilitated that decision in order to achieve better fixation on that glenoid baseplate.  At this time,  the subscapularis was peeled off of the lesser tuberosity down to the 7 o'clock position.  The capsule was also removed from the humeral neck 2 cm inferiorly.  At this time  prior to doing that, a subscapularis release axillary nerve was identified,  palpated, visualized and a vessel loop was placed around it.  It was protected at all times during the case.  At this time, subscap was then taken down.  Progressive external rotation delivered the humeral head into the operative field.  Osteophytes were  removed.  Fatty infiltration of the subscap was noted.  At this time, the humeral head was dislocated anteriorly.  The head was then cut in approximately 30 degrees of retroversion compatible to its native version.  At this time, a cap was  placed.  We  were able to ream up to about 15 mm.  The cap was placed on the humeral head.  At this time, attention was directed towards the glenoid.  The anterior retractor was placed, and essentially a Bankart lesion was created from 12 o'clock down to 7 o'clock.    The middle glenohumeral ligament was released in order to facilitate subscapularis mobilization.  Tagging sutures were placed on the subscapularis.  At this time using patient specific instrumentation, the pin was placed.  Reaming of the glenoid was then  performed.  Again, bone quality was somewhat marginal.  We were able to ream primarily inferiorly and obtain bleeding bony surfaces around about 75% of the circumference of the reaming.  This was in correlation with preoperative plan which showed  predominantly inferior reaming of about 4 mm.  A baseplate was then placed on the small ledge.  A standard glenosphere was chosen.  This went on nicely in the [facility] taper.  It should be noted that the glenosphere baseplate was secured using 1 central  screw and 4 peripheral locking screws with good purchase obtained.  At this time, the glenosphere was placed.  Attention was then directed towards the humerus.  The stem was then broached up to a size 15.  This did give a good press-fit.  Trial reduction  was then performed with various combinations of baseplate and liner.  The once that was most stable without over tightening was the standard bearing with a 40 mm mini-humeral tray +6 taper offset.  This gave excellent stability and was very stable in  the position of forward flexion, extension and adduction.  The true component was then placed.  Same stability parameters were maintained, and the shoulder was reduced.  Axillary nerve was palpated, visualized and found to be intact.  Excessive tension  was not present on the conjoined tendon or the deltoid.  At this time, thorough irrigation was performed with about 3 L of irrigating solution.  Vancomycin powder was then placed within the glenohumeral joint.  Subscapularis was then repaired through  drill holes placed prior to placement on the stem.  Nice knots were utilized with [employer] which gave a very secure fixation in about 30 degrees of external  rotation.  Rotator interval was also closed with the arm in 30 degrees of external  rotation using 0 Vicryl suture.  At this time, the arm had a very smooth range of motion.  The incision was then irrigated again, and vancomycin powder was placed above the subscap closure.  The deltopectoral interval was closed using #1 Vicryl suture  followed by interrupted inverted 0 Vicryl suture, 2-0 Vicryl suture and a 3-0 Monocryl.  Aquacel dressing was placed along with a shoulder immobilizer.  The patient tolerated the procedure well without immediate complications.  She was transferred to the  recovery room in stable condition.  LN/NUANCE  D:08/27/2018 T:08/27/2018 JOB:[id number]

## 2018-08-27 NOTE — Transfer of Care (Signed)
Immediate Anesthesia Transfer of Care Note  Patient: Deborah Mullins  Procedure(s) Performed: LEFT TOTAL SHOULDER ARTHROPLASTY (Left Shoulder)  Patient Location: PACU  Anesthesia Type:GA combined with regional for post-op pain  Level of Consciousness: awake, alert  and oriented  Airway & Oxygen Therapy: Patient Spontanous Breathing and Patient connected to nasal cannula oxygen  Post-op Assessment: Report given to RN and Post -op Vital signs reviewed and stable  Post vital signs: Reviewed and stable  Last Vitals:  Vitals Value Taken Time  BP 125/87 08/27/2018 11:25 AM  Temp    Pulse 96 08/27/2018 11:28 AM  Resp 19 08/27/2018 11:29 AM  SpO2 100 % 08/27/2018 11:28 AM  Vitals shown include unvalidated device data.  Last Pain:  Vitals:   08/27/18 0559  TempSrc:   PainSc: 10-Worst pain ever      Patients Stated Pain Goal: 4 (40/37/54 3606)  Complications: No apparent anesthesia complications

## 2018-08-27 NOTE — Plan of Care (Signed)
  Problem: Safety: Goal: Ability to remain free from injury will improve Outcome: Progressing   Problem: Education: Goal: Knowledge of the prescribed therapeutic regimen will improve Outcome: Progressing Goal: Understanding of activity limitations/precautions following surgery will improve Outcome: Progressing Goal: Individualized Educational Video(s) Outcome: Progressing   Problem: Pain Management: Goal: Pain level will decrease with appropriate interventions Outcome: Progressing   Problem: Activity: Goal: Ability to tolerate increased activity will improve Outcome: Progressing

## 2018-08-27 NOTE — Anesthesia Procedure Notes (Signed)
Procedure Name: Intubation Date/Time: 08/27/2018 7:38 AM Performed by: Mariea Clonts, CRNA Pre-anesthesia Checklist: Patient identified, Emergency Drugs available, Suction available, Patient being monitored and Timeout performed Patient Re-evaluated:Patient Re-evaluated prior to induction Oxygen Delivery Method: Circle system utilized Preoxygenation: Pre-oxygenation with 100% oxygen Induction Type: IV induction and Cricoid Pressure applied Ventilation: Mask ventilation without difficulty Laryngoscope Size: Mac and 3 Grade View: Grade II Tube type: Oral Tube size: 7.0 mm Number of attempts: 1 Airway Equipment and Method: Stylet Placement Confirmation: ETT inserted through vocal cords under direct vision,  positive ETCO2,  CO2 detector and breath sounds checked- equal and bilateral Secured at: 22 cm Tube secured with: Tape Dental Injury: Teeth and Oropharynx as per pre-operative assessment

## 2018-08-27 NOTE — Progress Notes (Signed)
Small nondisplaced inferior humeral shaft fracture measuring about 2 cm noted on postop x-rays.  Implant is stable.  We went through significant range of motion after reduction.  I think this is a stable fracture.  We will slow down rehab for the first week or 2 and follow it closely radiographically but do not anticipate return to the OR unless implant settling is noted.

## 2018-08-27 NOTE — Brief Op Note (Signed)
08/27/2018  3:24 PM  PATIENT:  Deborah Mullins  68 y.o. female  PRE-OPERATIVE DIAGNOSIS:  left shoulder osteoarthritis  POST-OPERATIVE DIAGNOSIS:  left shoulder osteoarthritis  PROCEDURE:  Procedure(s): LEFT TOTAL SHOULDER ARTHROPLASTY  SURGEON:  Surgeon(s): Marlou Sa, Tonna Corner, MD  ASSISTANT: Modena Slater rnfa  ANESTHESIA:   general  EBL: 250 ml    Total I/O In: 4665 [I.V.:1000; Other:200; IV Piggyback:250] Out: 800 [Urine:500; Blood:300]  BLOOD ADMINISTERED: none  DRAINS: none   LOCAL MEDICATIONS USED:  none  SPECIMEN:  No Specimen  COUNTS:  YES  TOURNIQUET:  * No tourniquets in log *  DICTATION: .Other Dictation: Dictation Number [id number]  PLAN OF CARE: Admit to inpatient   PATIENT DISPOSITION:  PACU - hemodynamically stable

## 2018-08-27 NOTE — Interval H&P Note (Signed)
History and Physical Interval Note:  08/27/2018 7:09 AM  Deborah Mullins  has presented today for surgery, with the diagnosis of left shoulder osteoarthritis  The various methods of treatment have been discussed with the patient and family. After consideration of risks, benefits and other options for treatment, the patient has consented to  Procedure(s): LEFT TOTAL SHOULDER ARTHROPLASTY (Left) as a surgical intervention .  The patient's history has been reviewed, patient examined, no change in status, stable for surgery.  I have reviewed the patient's chart and labs.  Questions were answered to the patient's satisfaction.     Anderson Malta

## 2018-08-28 ENCOUNTER — Encounter (HOSPITAL_COMMUNITY): Payer: Self-pay | Admitting: Orthopedic Surgery

## 2018-08-28 LAB — GLUCOSE, CAPILLARY: GLUCOSE-CAPILLARY: 93 mg/dL (ref 70–99)

## 2018-08-28 MED ORDER — HYDROCODONE-ACETAMINOPHEN 10-325 MG PO TABS: 1.0000 | ORAL_TABLET | ORAL | 0 refills | Status: DC | PRN

## 2018-08-28 MED ORDER — HYDROCODONE-ACETAMINOPHEN 10-325 MG PO TABS: 1.0000 | ORAL_TABLET | ORAL | Status: DC | PRN

## 2018-08-28 MED ORDER — METHOCARBAMOL 500 MG PO TABS: 500.0000 mg | ORAL_TABLET | Freq: Three times a day (TID) | ORAL | 0 refills | Status: DC | PRN

## 2018-08-28 MED ORDER — TRAMADOL HCL 50 MG PO TABS: 50.0000 mg | ORAL_TABLET | Freq: Four times a day (QID) | ORAL | 0 refills | Status: DC

## 2018-08-28 MED FILL — HYDROCODON-APAP 10-325: 10-325 | 7 days supply | Qty: 42 | Fill #0

## 2018-08-28 NOTE — Progress Notes (Signed)
Occupational Therapy Treatment Patient Details Name: Deborah Mullins MRN: [id number] DOB: 1950/08/31 Today's Date: 08/28/2018    History of present illness 68 yo female s/p left total shoulder arthroplasty. Small nondisplaced inferior humeral shaft fracture measuring about 2 cm shown on postop x-rays. PMH including Anxiety, Arthritis, Bipolar 1 disorder, Breast cancer (2004), Carpal tunnel syndrome of left wrist, Chronic neck pain, Chronic shoulder pain, Depression, GERD, Hyperlipidemia, HTN, Lung cancer, PONV (postoperative nausea and vomiting), and Type 2 diabetes mellitus.   OT comments  Returned for second visit to continue education on compensatory techniques for UB ADLs. Pt planning for dc later today and clarified MD ROM orders for no ROM at shoulder for one week post surgery. Providing education on compensatory techniques for UB dressing, bathing, and sling management. Pt and significant other demonstrating understanding; pt requiring Min A for bathing and Max A for donning shirt and sling. Providing education on proper sling positioning. Update dc recommendation to resume therapy one week post surgery. All questions answered and education provided in preparation for dc home later today.    Follow Up Recommendations  Other (comment);Follow surgeon's recommendation for DC plan and follow-up therapies;Supervision/Assistance - 24 hour(Hold therapy for one week untill follow up with MD)    Equipment Recommendations  None recommended by OT    Recommendations for Other Services      Precautions / Restrictions Precautions Precautions: Shoulder Type of Shoulder Precautions: NWB. Active protocal. FF 0-90. AB 0-60. ER 0-30. Pendulums. Shoulder Interventions: Shoulder sling/immobilizer;Shoulder abduction pillow;At all times;Off for dressing/bathing/exercises Precaution Booklet Issued: Yes (comment) Precaution Comments: Verbal order from [facility] for no ROM at shoulder for one week due to break.  Continue ROM at hand, wrist, and elbow.  Required Braces or Orthoses: Sling Restrictions Weight Bearing Restrictions: No LUE Weight Bearing: Non weight bearing       Mobility Bed Mobility Overal bed mobility: Needs Assistance Bed Mobility: Rolling;Sit to Sidelying Rolling: Min guard       Sit to sidelying: Min guard General bed mobility comments: Min Guard A for safety  Transfers                      Balance Overall balance assessment: Needs assistance Sitting-balance support: No upper extremity supported;Feet supported Sitting balance-Leahy Scale: Good                                     ADL either performed or assessed with clinical judgement   ADL Overall ADL's : Needs assistance/impaired         Upper Body Bathing: Minimal assistance;Sitting Upper Body Bathing Details (indicate cue type and reason): Edcauting pt on compensatory techniques for UB bathing to reduce LUE movement and adhere to ROM precautions. Pt demonstrating understanding     Upper Body Dressing : Sitting;Maximal assistance Upper Body Dressing Details (indicate cue type and reason): Max A for donning shirt (button up) and sling. Educating pt compensatory techniques to aadhere to precautions. Significant other demosntrting understanding. Also repeating education on sling positioning                    General ADL Comments: Continued education on UB ADLs for compensatory techniques to adhere to precautions for no ROM at shoulder     Vision       Perception     Praxis      Cognition Arousal/Alertness: Awake/alert Behavior During Therapy:  Anxious;Restless Overall Cognitive Status: Within Functional Limits for tasks assessed                                 General Comments: Pt with baseline bipolar diagnosis. Pt with increased anxiety throughout session and required cues for relaxation techniques.         Exercises Exercises: Shoulder Shoulder  Exercises Shoulder External Rotation: (0-30; Did not perform due to pain)   Shoulder Instructions Shoulder Instructions Donning/doffing shirt without moving shoulder: Maximal assistance;Caregiver independent with task Method for sponge bathing under operated UE: Caregiver independent with task;Minimal assistance Donning/doffing sling/immobilizer: Caregiver independent with task;Maximal assistance Correct positioning of sling/immobilizer: Maximal assistance;Caregiver independent with task ROM for elbow, wrist and digits of operated UE: Supervision/safety Sling wearing schedule (on at all times/off for ADL's): Supervision/safety Proper positioning of operated UE when showering: Minimal assistance Positioning of UE while sleeping: Minimal assistance     General Comments Significant other present throughout    Pertinent Vitals/ Pain       Pain Assessment: Faces Faces Pain Scale: Hurts even more Pain Location: Left shoulder, chest, and back Pain Descriptors / Indicators: Constant;Grimacing;Discomfort;Guarding;Aching;Moaning;Restless Pain Intervention(s): Monitored during session;Limited activity within patient's tolerance;Repositioned;Relaxation  Home Living                                          Prior Functioning/Environment              Frequency  Min 2X/week        Progress Toward Goals  OT Goals(current goals can now be found in the care plan section)  Progress towards OT goals: Progressing toward goals;Goals met/education completed, patient discharged from OT  Acute Rehab OT Goals Patient Stated Goal: "Talk to the doctor to change my medication" OT Goal Formulation: With patient Time For Goal Achievement: 09/11/18 Potential to Achieve Goals: Good ADL Goals Pt Will Perform Upper Body Dressing: with caregiver independent in assisting;sitting Pt/caregiver will Perform Home Exercise Program: Left upper extremity;Increased strength;Increased  ROM;Independently;With written HEP provided(Adhering to precautions) Additional ADL Goal #1: Pt will verbalize shoudler precautions with 1-2 VCs  Plan Discharge plan needs to be updated;All goals met and education completed, patient discharged from OT services    Co-evaluation                 AM-PAC OT "6 Clicks" Daily Activity     Outcome Measure   Help from another person eating meals?: None Help from another person taking care of personal grooming?: None Help from another person toileting, which includes using toliet, bedpan, or urinal?: A Little Help from another person bathing (including washing, rinsing, drying)?: A Lot Help from another person to put on and taking off regular upper body clothing?: A Lot Help from another person to put on and taking off regular lower body clothing?: A Lot 6 Click Score: 17    End of Session Equipment Utilized During Treatment: Other (comment)(Sling)  OT Visit Diagnosis: Unsteadiness on feet (R26.81);Other abnormalities of gait and mobility (R26.89);Muscle weakness (generalized) (M62.81);Pain Pain - Right/Left: Left Pain - part of body: Shoulder   Activity Tolerance Patient tolerated treatment well;Patient limited by pain   Patient Left in bed;with call bell/phone within reach;with family/visitor present   Nurse Communication Mobility status;Precautions;Weight bearing status        Time: 4081-4481 OT  Time Calculation (min): 15 min  Charges: OT General Charges $OT Visit: 1 Visit OT Treatments $Self Care/Home Management : 8-22 mins  [place], OTR/L Acute Rehab Pager: [phone] Office: [place] 08/28/2018, 1:59 PM

## 2018-08-28 NOTE — Care Management (Addendum)
Requested face to face and PT order for [facility]. Spoke w Dr Marlou Sa who states that patient will DC to home and does not need HH. Patient was pre op assigned to [facility]. Notified Tiffany Watson clinical liaison w [facility]

## 2018-08-28 NOTE — Progress Notes (Signed)
Subjective: Patient stable.  Block is worn off and she is having some pain.  Left hand is functional.   Objective: Vital signs in last 24 hours: Temp:  [97.6 F (36.4 C)-98.5 F (36.9 C)] 98.2 F (36.8 C) (01/15 0812) Pulse Rate:  [77-116] 116 (01/15 0812) Resp:  [11-18] 18 (01/15 0812) BP: (101-154)/(70-98) 154/98 (01/15 0812) SpO2:  [90 %-100 %] 97 % (01/15 0812)  Intake/Output from previous day: 01/14 0701 - 01/15 0700 In: 1450 [I.V.:1000; IV Piggyback:250] Out: 800 [Urine:500; Blood:300] Intake/Output this shift: No intake/output data recorded.  Exam:  Incision: no drainage No cellulitis present  Labs: No results for input(s): HGB in the last 72 hours. No results for input(s): WBC, RBC, HCT, PLT in the last 72 hours. No results for input(s): NA, K, CL, CO2, BUN, CREATININE, GLUCOSE, CALCIUM in the last 72 hours. No results for input(s): LABPT, INR in the last 72 hours.  Assessment/Plan: Patient doing reasonably well following left shoulder surgery.  I did show the patient and her husband the small crack below the stem of the prosthesis.  Again I do not think this is a major issue in terms of shoulder stability.  Think there is a small chance that the implant may settle but the current design would argue against that based on the humeral tray.  Nonetheless I am going to have her hold off on any active range of motion for the first week until we get repeat radiographs next week.  At that time I would likely start her in the abduction brace for range of motion if there is been no change in radiographs.  This was moved around significantly at the time of surgery with no untoward effects on implant stability before the crack in the humerus was seen on postop x-rays.   Landry Dyke Emmaclaire Switala 08/28/2018, 8:42 AM

## 2018-08-28 NOTE — Progress Notes (Signed)
Pt and husband given D/C instructions with verbal understanding. Pain med Rx was filled by [facility] prior to D/C, other Rx's were e-faxed to Pt's pharmacy. Pt's incision is clean and dry with no sign of infection. Pt's IV was removed prior to D/C. Pt D/C'd home via wheelchair @ 1035 per MD order. Holli Humbles, RN

## 2018-08-28 NOTE — Evaluation (Addendum)
Occupational Therapy Evaluation Patient Details Name: Deborah Mullins MRN: [id number] DOB: January 27, 1951 Today's Date: 08/28/2018    History of Present Illness 68 yo female s/p left total shoulder arthroplasty. Small nondisplaced inferior humeral shaft fracture measuring about 2 cm shown on postop x-rays. PMH including Anxiety, Arthritis, Bipolar 1 disorder, Breast cancer (2004), Carpal tunnel syndrome of left wrist, Chronic neck pain, Chronic shoulder pain, Depression, GERD, Hyperlipidemia, HTN, Lung cancer, PONV (postoperative nausea and vomiting), and Type 2 diabetes mellitus.    Clinical Impression   PTA, pt was living with her significant other who assisted with some bathing/dressing due to shoulder pain. Pt currently requiring Mod-Max A for UB ADLs, Mod A for LB ADLs, and Min A for bed mobility. Due to pain, pt with limited participate this first session; completing hand, wrist, and elbow exercises only. Educating pt on shoulder exercises for when pain reduces. Initiated education on compensatory techniques for UB ADLs. Will return for second visit later today to continue education on ADLs and shoulder precautions. Recommend dc to home with HHOT for further OT to optimize safety, independence with ADLs, and return to PLOF.       Follow Up Recommendations  Home health OT;Supervision/Assistance - 24 hour    Equipment Recommendations  None recommended by OT    Recommendations for Other Services       Precautions / Restrictions Precautions Precautions: Shoulder Type of Shoulder Precautions: NWB. Active protocal. FF 0-90. AB 0-60. ER 0-30. Pendulums. Shoulder Interventions: Shoulder sling/immobilizer;Shoulder abduction pillow;At all times;Off for dressing/bathing/exercises Precaution Booklet Issued: Yes (comment) Precaution Comments: Educated pt on precautions and exercises. Initating compensatory techniques for ADLs.  Required Braces or Orthoses: Sling Restrictions Weight Bearing  Restrictions: No LUE Weight Bearing: Non weight bearing      Mobility Bed Mobility Overal bed mobility: Needs Assistance Bed Mobility: Rolling;Sidelying to Sit Rolling: Min guard Sidelying to sit: Min assist       General bed mobility comments: Min A to elevate trunk  Transfers                      Balance Overall balance assessment: Needs assistance Sitting-balance support: No upper extremity supported;Feet supported Sitting balance-Leahy Scale: Good                                     ADL either performed or assessed with clinical judgement   ADL Overall ADL's : Needs assistance/impaired Eating/Feeding: Set up;Supervision/ safety;Sitting   Grooming: Set up;Supervision/safety;Sitting   Upper Body Bathing: Moderate assistance;Sitting   Lower Body Bathing: Moderate assistance;Sit to/from stand   Upper Body Dressing : Maximal assistance;Sitting Upper Body Dressing Details (indicate cue type and reason): Max A to don sling. Initating education on UB dressing Lower Body Dressing: Moderate assistance;Sit to/from stand                 General ADL Comments: Pt evaluation limited by pain and increased anxiety throughout. Pt reporting how she wants to speak with the MD about changing medication for pain. Initated education on bathing, dressing, sling management, and sleep position. Pt performing LUE exercises while supine and sitting at EOB     Vision Baseline Vision/History: Wears glasses Wears Glasses: At all times Patient Visual Report: No change from baseline       Perception     Praxis      Pertinent Vitals/Pain Pain Assessment: Faces Faces Pain Scale:  Hurts whole lot Pain Location: Left shoulder, chest, and back Pain Descriptors / Indicators: Constant;Grimacing;Discomfort;Guarding;Aching;Moaning;Restless Pain Intervention(s): Monitored during session;Limited activity within patient's tolerance;Repositioned;Relaxation     Hand  Dominance Right   Extremity/Trunk Assessment Upper Extremity Assessment Upper Extremity Assessment: LUE deficits/detail LUE Deficits / Details: S/p shoulder replacement LUE: Unable to fully assess due to pain;Unable to fully assess due to immobilization LUE Coordination: decreased gross motor   Lower Extremity Assessment Lower Extremity Assessment: Defer to PT evaluation   Cervical / Trunk Assessment Cervical / Trunk Assessment: Normal   Communication Communication Communication: No difficulties   Cognition Arousal/Alertness: Awake/alert Behavior During Therapy: Anxious;Restless Overall Cognitive Status: Within Functional Limits for tasks assessed                                 General Comments: Pt with baseline bipolar diagnosis. Pt with increased anxiety throughout session and required cues for relaxation techniques.    General Comments  Signficant other present throughout    Exercises Exercises: Shoulder Shoulder Exercises Shoulder Flexion: (0-90; Did not perform due to pain) Shoulder ABduction: (0-60; Did not perform due to pain) Shoulder External Rotation: (0-30; Did not perform due to pain) Elbow Flexion: AROM;Left;10 reps;Supine Elbow Extension: AROM;Left;10 reps;Supine Wrist Flexion: AROM;Left;10 reps;Supine Wrist Extension: AROM;Left;10 reps;Supine Digit Composite Flexion: AROM;Left;10 reps;Supine Composite Extension: AROM;Left;10 reps;Supine   Shoulder Instructions Shoulder Instructions Method for sponge bathing under operated UE: Moderate assistance Donning/doffing sling/immobilizer: Maximal assistance Correct positioning of sling/immobilizer: Maximal assistance ROM for elbow, wrist and digits of operated UE: Supervision/safety Sling wearing schedule (on at all times/off for ADL's): Supervision/safety Proper positioning of operated UE when showering: Minimal assistance Positioning of UE while sleeping: Minimal assistance    Home Living  Family/patient expects to be discharged to:: Private residence Living Arrangements: Spouse/significant other Available Help at Discharge: Family;Available 24 hours/day Type of Home: House             Bathroom Shower/Tub: Teacher, early years/pre: Standard     Home Equipment: Grab bars - tub/shower;Shower seat;Tub bench          Prior Functioning/Environment Level of Independence: Needs assistance    ADL's / Homemaking Assistance Needed: Signficant other assisting with BADLs since limited shoulder ROM   Comments: Prior independence before injury        OT Problem List: Decreased strength;Decreased range of motion;Decreased activity tolerance;Impaired balance (sitting and/or standing);Decreased knowledge of use of DME or AE;Decreased knowledge of precautions;Decreased safety awareness;Pain;Impaired UE functional use      OT Treatment/Interventions: Self-care/ADL training;Therapeutic exercise;Energy conservation;DME and/or AE instruction;Therapeutic activities;Patient/family education    OT Goals(Current goals can be found in the care plan section) Acute Rehab OT Goals Patient Stated Goal: "Talk to the doctor to change my medication" OT Goal Formulation: With patient Time For Goal Achievement: 09/11/18 Potential to Achieve Goals: Good  OT Frequency: Min 2X/week   Barriers to D/C:            Co-evaluation              AM-PAC OT "6 Clicks" Daily Activity     Outcome Measure Help from another person eating meals?: None Help from another person taking care of personal grooming?: None Help from another person toileting, which includes using toliet, bedpan, or urinal?: A Little Help from another person bathing (including washing, rinsing, drying)?: A Lot Help from another person to put on and taking off regular upper  body clothing?: A Lot Help from another person to put on and taking off regular lower body clothing?: A Lot 6 Click Score: 17   End of  Session Equipment Utilized During Treatment: Other (comment)(Sling) Nurse Communication: Mobility status;Precautions;Weight bearing status;Patient requests pain meds  Activity Tolerance: Patient limited by pain Patient left: in bed;with call bell/phone within reach;with family/visitor present;with nursing/sitter in room(EOB eating breakfast)  OT Visit Diagnosis: Unsteadiness on feet (R26.81);Other abnormalities of gait and mobility (R26.89);Muscle weakness (generalized) (M62.81);Pain Pain - Right/Left: Left Pain - part of body: Shoulder                Time: 7654-6503 OT Time Calculation (min): 31 min Charges:  OT General Charges $OT Visit: 1 Visit OT Evaluation $OT Eval Moderate Complexity: 1 Mod OT Treatments $Self Care/Home Management : 8-22 mins  Russie Gulledge MSOT, OTR/L Acute Rehab Pager: [phone] Office: [name] 08/28/2018, 10:41 AM

## 2018-08-29 ENCOUNTER — Telehealth (INDEPENDENT_AMBULATORY_CARE_PROVIDER_SITE_OTHER): Payer: Self-pay | Admitting: Orthopedic Surgery

## 2018-08-29 NOTE — Telephone Encounter (Signed)
Please advise. Patient had left total shoulder arthroplasty on 08/27/2018.

## 2018-08-29 NOTE — Telephone Encounter (Signed)
Patient called to check her fu appointment and was scheduled for January 29th at 1:15.  Patient stated that Dr. Marlou Sa wanted to see her back in a week not two weeks.  Could you ask Dr. Marlou Sa if he needs to see her soon than the 29th.  CB#[phone].  Thank you.

## 2018-08-29 NOTE — Telephone Encounter (Signed)
Need appt for 09/02/2018 thx

## 2018-08-30 NOTE — Telephone Encounter (Signed)
Dr. Marlou Sa wants to see patient on Monday. Can you please call and work her in to his schedule?

## 2018-08-30 NOTE — Anesthesia Postprocedure Evaluation (Signed)
Anesthesia Post Note  Patient: Deborah Mullins  Procedure(s) Performed: LEFT TOTAL SHOULDER ARTHROPLASTY (Left Shoulder)     Patient location during evaluation: PACU Anesthesia Type: General Level of consciousness: awake and alert Pain management: pain level controlled Vital Signs Assessment: post-procedure vital signs reviewed and stable Respiratory status: spontaneous breathing, nonlabored ventilation, respiratory function stable and patient connected to nasal cannula oxygen Cardiovascular status: blood pressure returned to baseline and stable Postop Assessment: no apparent nausea or vomiting Anesthetic complications: no    Last Vitals:  Vitals:   08/28/18 0230 08/28/18 0812  BP: 127/74 (!) 154/98  Pulse: 91 (!) 116  Resp: 18 18  Temp: 36.7 C 36.8 C  SpO2: 95% 97%    Last Pain:  Vitals:   08/28/18 0815  TempSrc:   PainSc: [place]

## 2018-09-02 ENCOUNTER — Ambulatory Visit (INDEPENDENT_AMBULATORY_CARE_PROVIDER_SITE_OTHER): Payer: Medicare Other

## 2018-09-02 ENCOUNTER — Ambulatory Visit (INDEPENDENT_AMBULATORY_CARE_PROVIDER_SITE_OTHER): Payer: Medicare Other | Admitting: Orthopedic Surgery

## 2018-09-02 ENCOUNTER — Encounter (INDEPENDENT_AMBULATORY_CARE_PROVIDER_SITE_OTHER): Payer: Self-pay | Admitting: Orthopedic Surgery

## 2018-09-02 ENCOUNTER — Other Ambulatory Visit (INDEPENDENT_AMBULATORY_CARE_PROVIDER_SITE_OTHER): Payer: Self-pay | Admitting: Radiology

## 2018-09-02 DIAGNOSIS — M25512 Pain in left shoulder: Secondary | ICD-10-CM | POA: Diagnosis not present

## 2018-09-02 MED ORDER — HYDROCODONE-ACETAMINOPHEN 10-325 MG PO TABS: ORAL_TABLET | ORAL | 0 refills | Status: DC

## 2018-09-02 NOTE — Discharge Summary (Signed)
Physician Discharge Summary  Patient ID: Deborah Mullins MRN: [id number] DOB/AGE: [birth year] 68 y.o.  Admit date: 08/27/2018 Discharge date: 08/28/2018  Admission Diagnoses:  Active Problems:   Shoulder arthritis   Discharge Diagnoses:  Same  Surgeries: Procedure(s): LEFT TOTAL SHOULDER ARTHROPLASTY on 08/27/2018   Consultants:   Discharged Condition: Stable  Hospital Course: Deborah Mullins is an 68 y.o. female who was admitted 08/27/2018 with a chief complaint of , and found to have a diagnosis of left shoulder arthritis.  They were brought to the operating room on 08/27/2018 and underwent the above named procedures.  Patient tolerated the procedure well without immediate complication; however, on postoperative radiographs there was a small crack noted distal to the tip of the stem.  This appeared to be stable.  Plan at this time is delayed mobilization for at least 1 week until I can repeat radiographs.  She will stay in the sling but come out once a day for elbow range of motion.  Discharged home with pain medicine and muscle relaxers.  Antibiotics given:  Anti-infectives (From admission, onward)   Start     Dose/Rate Route Frequency Ordered Stop   08/27/18 1800  vancomycin (VANCOCIN) IVPB 1000 mg/200 mL premix     1,000 mg 200 mL/hr over 60 Minutes Intravenous Every 12 hours 08/27/18 1239 08/27/18 1855   08/27/18 1014  vancomycin (VANCOCIN) powder  Status:  Discontinued       As needed 08/27/18 1014 08/27/18 1121   08/27/18 0600  vancomycin (VANCOCIN) IVPB 1000 mg/200 mL premix     1,000 mg 200 mL/hr over 60 Minutes Intravenous On call to O.R. 08/27/18 0505 08/27/18 4970    .  Recent vital signs:  Vitals:   08/28/18 0230 08/28/18 0812  BP: 127/74 (!) 154/98  Pulse: 91 (!) 116  Resp: 18 18  Temp: 98 F (36.7 C) 98.2 F (36.8 C)  SpO2: 95% 97%    Recent laboratory studies:  Results for orders placed or performed during the hospital encounter of 08/27/18  Glucose,  capillary  Result Value Ref Range   Glucose-Capillary 89 70 - 99 mg/dL   Comment 1 Notify RN    Comment 2 Document in Chart   Glucose, capillary  Result Value Ref Range   Glucose-Capillary 103 (H) 70 - 99 mg/dL  Glucose, capillary  Result Value Ref Range   Glucose-Capillary 159 (H) 70 - 99 mg/dL  Glucose, capillary  Result Value Ref Range   Glucose-Capillary 129 (H) 70 - 99 mg/dL   Comment 1 Notify RN    Comment 2 Document in Chart   Glucose, capillary  Result Value Ref Range   Glucose-Capillary 93 70 - 99 mg/dL   Comment 1 Notify RN    Comment 2 Document in Chart     Discharge Medications:   Allergies as of 08/28/2018      Reactions   Bee Venom Anaphylaxis   Penicillins Rash, Other (See Comments)   PATIENT HAS HAD A PCN REACTION WITH IMMEDIATE RASH, FACIAL/TONGUE/THROAT SWELLING, SOB, OR LIGHTHEADEDNESS WITH HYPOTENSION:  #  #  YES  #  #  Has patient had a PCN reaction causing severe rash involving mucus membranes or skin necrosis: No Has patient had a PCN reaction that required hospitalization No Has patient had a PCN reaction occurring within the last 10 years: No      Medication List    STOP taking these medications   acetaminophen-codeine 300-30 MG tablet Commonly known as:  TYLENOL #  3   HYDROcodone-acetaminophen 5-325 MG tablet Commonly known as:  NORCO/VICODIN     TAKE these medications   albuterol 108 (90 Base) MCG/ACT inhaler Commonly known as:  PROVENTIL HFA;VENTOLIN HFA Inhale 2 puffs into the lungs every 4 (four) hours as needed for wheezing or shortness of breath.   aspirin 81 MG tablet Take 1 tablet (81 mg total) by mouth daily.   blood glucose meter kit and supplies Kit Check up to twice a day before meals   buPROPion 150 MG 12 hr tablet Commonly known as:  WELLBUTRIN SR TAKE ONCE DAILY FOR 5 DAYS THEN INCREASE TO ONE TWICE A DAY What changed:  See the new instructions.   cetirizine 10 MG tablet Commonly known as:  ZYRTEC Take 1 tablet (10  mg total) by mouth daily.   FLUoxetine 40 MG capsule Commonly known as:  PROZAC TAKE 1 CAPSULE BY MOUTH EVERY DAY What changed:  how much to take   fluticasone 50 MCG/ACT nasal spray Commonly known as:  FLONASE Place 2 sprays into both nostrils daily. What changed:    when to take this  reasons to take this   gabapentin 800 MG tablet Commonly known as:  NEURONTIN Take 1 tablet (800 mg total) by mouth 2 (two) times daily.   glucose blood test strip Check up to twice a day   losartan 50 MG tablet Commonly known as:  COZAAR Take 1 tablet (50 mg total) by mouth daily.   metFORMIN 500 MG tablet Commonly known as:  GLUCOPHAGE Take 1 tablet (500 mg total) by mouth 2 (two) times daily with a meal.   methocarbamol 500 MG tablet Commonly known as:  ROBAXIN Take 1 tablet (500 mg total) by mouth every 8 (eight) hours as needed for muscle spasms.   onetouch ultrasoft lancets Use as instructed   pantoprazole 40 MG tablet Commonly known as:  PROTONIX TAKE 1 TABLET BY MOUTH EVERY DAY What changed:    how much to take  how to take this  when to take this  additional instructions   rosuvastatin 40 MG tablet Commonly known as:  CRESTOR TAKE 1 TABLET BY MOUTH EVERY DAY   traMADol 50 MG tablet Commonly known as:  ULTRAM Take 1 tablet (50 mg total) by mouth every 6 (six) hours.       Diagnostic Studies: Dg Shoulder Left Port  Result Date: 08/27/2018 CLINICAL DATA:  68 year old female post left shoulder replacement. Subsequent encounter. EXAM: LEFT SHOULDER - 1 VIEW COMPARISON:  04/26/2018 CT. FINDINGS: Post left shoulder replacement. Lucency along the tip of the humeral component raises possibility of fracture as versus artifact from overlying structures. Follow-up examination with removal of surrounding soft tissue structures or CT recommended. Acromioclavicular joint degenerative changes with bony overgrowth. Left costophrenic angle blunting may represent minimal pleural  fluid/scarring and left base subsegmental atelectasis. IMPRESSION: Post left shoulder replacement. Lucency along the tip of the humeral component raises possibility of fracture as versus artifact from overlying structures. Follow-up examination with removal of surrounding soft tissue structures or CT recommended. These results will be called to the ordering clinician or representative by the Radiologist Assistant, and communication documented in the PACS or zVision Dashboard. Electronically Signed   By: Genia Del M.D.   On: 08/27/2018 12:11   Xr Humerus Left  Result Date: 09/02/2018 2 views left humerus reviewed.  Reverse shoulder prosthesis in good position alignment.  Small crack distal to the prosthesis is noted which is unchanged from immediate postop x-rays.  Shoulder is located.  Xr Shoulder Left  Result Date: 09/02/2018 AP outlet left shoulder reviewed.  Reverse shoulder replacement in good position alignment with no complicating features.  Small crack distal to the prosthesis noted with no change in alignment.  This is only seen in one view.  No propagation.   Disposition:   Discharge Instructions    Call MD / Call 911   Complete by:  As directed    If you experience chest pain or shortness of breath, CALL 911 and be transported to the hospital emergency room.  If you develope a fever above 101 F, pus (white drainage) or increased drainage or redness at the wound, or calf pain, call your surgeon's office.   Constipation Prevention   Complete by:  As directed    Drink plenty of fluids.  Prune juice may be helpful.  You may use a stool softener, such as Colace (over the counter) 100 mg twice a day.  Use MiraLax (over the counter) for constipation as needed.   Diet - low sodium heart healthy   Complete by:  As directed    Discharge instructions   Complete by:  As directed    Okay to shower.  Dressing waterproof Stay in sling with no real motion in that shoulder for the first week  postop Okay to come out of sling once a day just to straighten the elbow. Use the blue sling and not the black brace.   Increase activity slowly as tolerated   Complete by:  As directed          Signed: Anderson Malta 09/02/2018, 8:02 PM

## 2018-09-02 NOTE — Progress Notes (Signed)
Post-Op Visit Note   Patient: Deborah Mullins           Date of Birth: 06-09-1951           MRN: [id number] Visit Date: 09/02/2018 PCP: Susy Frizzle, MD   Assessment & Plan:  Chief Complaint:  Chief Complaint  Patient presents with  . Left Shoulder - Routine Post Op   Visit Diagnoses:  1. Left shoulder pain, unspecified chronicity     Plan: Ramonica is a patient is now about a week out left reverse shoulder replacement.  On exam the incisions intact deltoid fires.  She did have a small humeral crack noted distal to the prosthesis on postop x-rays.  She has been immobilized in the sling.  On exam she has minimal pain with forward flexion and abduction.  Does have some pain with external rotation which I think is more related to her subscap repair.  Plan at this time is to refill Norco and start her on that CPM machine but just at 0 to 15 degrees for 1 hour 3 times a day.  Follow-up with me in 1 week with repeat x-rays on return.  Hold off on home health physical therapy for now.  Continue with sling for now.  But if the x-rays look stable I will take her out of the sling next week.  Follow-Up Instructions: Return in about 1 week (around 09/09/2018).   Orders:  Orders Placed This Encounter  Procedures  . XR Shoulder Left  . XR Humerus Left   No orders of the defined types were placed in this encounter.   Imaging: Xr Humerus Left  Result Date: 09/02/2018 2 views left humerus reviewed.  Reverse shoulder prosthesis in good position alignment.  Small crack distal to the prosthesis is noted which is unchanged from immediate postop x-rays.  Shoulder is located.  Xr Shoulder Left  Result Date: 09/02/2018 AP outlet left shoulder reviewed.  Reverse shoulder replacement in good position alignment with no complicating features.  Small crack distal to the prosthesis noted with no change in alignment.  This is only seen in one view.  No propagation.   PMFS History: Patient Active  Problem List   Diagnosis Date Noted  . Shoulder arthritis 08/27/2018  . Primary osteoarthritis, left shoulder 04/01/2018  . Primary osteoarthritis, right shoulder 04/01/2018  . Essential hypertension 01/22/2017  . Controlled type 2 diabetes mellitus without complication, without long-term current use of insulin ([place]) 11/23/2016  . Chronic pain of both shoulders 07/24/2016  . Prediabetes   . Anxiety   . Bipolar 1 disorder ([place])   . Depression   . Chronic back pain 11/07/2012  . Insomnia secondary to depression with anxiety 09/23/2012  . Unspecified vitamin D deficiency 09/23/2012  . COPD, mild ([place]) 07/01/2012  . Acute bronchitis 06/08/2012  . Adenocarcinoma of lung, stage 1 ([place]) 05/23/2012  . RECTAL BLEEDING 04/27/2010  . HEMORRHOIDS 03/11/2009  . ADENOCARCINOMA, BREAST 12/15/2008  . FIBROIDS, UTERUS 12/15/2008  . HYPERCHOLESTEROLEMIA 12/15/2008  . Hyperlipidemia 12/15/2008  . Anxiety and depression 12/15/2008  . GASTROESOPHAGEAL REFLUX DISEASE 12/15/2008  . GASTRITIS 12/15/2008  . CONSTIPATION 12/15/2008  . FLATULENCE ERUCTATION AND GAS PAIN 12/15/2008  . ABDOMINAL PAIN 12/15/2008  . RECTAL BLEEDING, HX OF 12/15/2008   Past Medical History:  Diagnosis Date  . Allergic rhinitis   . Anxiety   . Arthritis   . Bipolar 1 disorder ([place])   . Breast cancer ([place]) 2004  . Carpal tunnel syndrome of  left wrist   . Chronic neck pain   . Chronic shoulder pain    L>R  . Depression   . GERD (gastroesophageal reflux disease)   . Headache(784.0)   . History of lung surgery   . Hyperlipidemia   . Hypertension   . Lung cancer ([place])   . Lung nodule 2009  . Pain in both feet   . Personal history of radiation therapy 2004  . PONV (postoperative nausea and vomiting)   . Type 2 diabetes mellitus ([facility])     Family History  Problem Relation Age of Onset  . Heart disease Mother        CHF  . Physical abuse Mother   . Depression Mother   . Cancer Sister   . ADD / ADHD Sister   .  Alcohol abuse Father   . Alcohol abuse Brother   . Mental illness Brother   . Cancer Sister   . Heart disease Sister   . Physical abuse Sister   . OCD Sister   . Mental illness Sister   . Anxiety disorder Neg Hx   . Bipolar disorder Neg Hx   . Dementia Neg Hx   . Drug abuse Neg Hx   . Paranoid behavior Neg Hx   . Schizophrenia Neg Hx   . Seizures Neg Hx   . Sexual abuse Neg Hx     Past Surgical History:  Procedure Laterality Date  . BREAST SURGERY  2005   lumpectomy - left  . carpel tunnel Bilateral   . CESAREAN SECTION  1971  . COLONOSCOPY WITH PROPOFOL N/A 07/25/2016   Procedure: COLONOSCOPY WITH PROPOFOL;  Surgeon: Danie Binder, MD;  [facility];  Service: Endoscopy;  Laterality: N/A;  8:15 AM  . EYE SURGERY    . LUNG CANCER SURGERY Right 05/23/2012   reportedly clear  . TONSILLECTOMY    . TOTAL SHOULDER ARTHROPLASTY Left 08/27/2018   Procedure: LEFT TOTAL SHOULDER ARTHROPLASTY;  Surgeon: Meredith Pel, MD;  Location: [facility];  Service: Orthopedics;  Laterality: Left;  . TUBAL LIGATION     Social History   Occupational History  . Occupation: retired    Comment: Location manager  Tobacco Use  . Smoking status: Former Smoker    Packs/day: 1.00    Years: 10.00    Pack years: 10.00    Types: Cigarettes    Last attempt to quit: 08/14/1974    Years since quitting: 44.0  . Smokeless tobacco: Never Used  Substance and Sexual Activity  . Alcohol use: Yes    Comment: occasionally   . Drug use: Yes    Types: Marijuana    Comment: smoking daily  . Sexual activity: Never

## 2018-09-09 ENCOUNTER — Ambulatory Visit (INDEPENDENT_AMBULATORY_CARE_PROVIDER_SITE_OTHER): Payer: Medicare Other

## 2018-09-09 ENCOUNTER — Ambulatory Visit (INDEPENDENT_AMBULATORY_CARE_PROVIDER_SITE_OTHER): Payer: Medicare Other | Admitting: Orthopedic Surgery

## 2018-09-09 ENCOUNTER — Encounter (INDEPENDENT_AMBULATORY_CARE_PROVIDER_SITE_OTHER): Payer: Self-pay | Admitting: Orthopedic Surgery

## 2018-09-09 DIAGNOSIS — M25512 Pain in left shoulder: Secondary | ICD-10-CM

## 2018-09-09 MED ORDER — HYDROCODONE-ACETAMINOPHEN 10-325 MG PO TABS: ORAL_TABLET | ORAL | 0 refills | Status: DC

## 2018-09-09 NOTE — Progress Notes (Signed)
Post-Op Visit Note   Patient: Deborah Mullins           Date of Birth: February 25, 1951           MRN: [id number] Visit Date: 09/09/2018 PCP: Susy Frizzle, MD   Assessment & Plan:  Chief Complaint:  Chief Complaint  Patient presents with  . Left Shoulder - Routine Post Op, Follow-up   Visit Diagnoses:  1. Left shoulder pain, unspecified chronicity     Plan: [name] is a patient is now 2 weeks out left reverse shoulder replacement.  She had a small cortical crack made on postop x-rays last week.  Admission going slow with her rehab.  She has been doing CPM passive motion only twice a day for about 30 minutes.  On exam incision is intact.  She has reasonable passive range of motion.  I want her to continue with no lifting with that left arm but continue with the CPM and she can increase it to about 60 degrees.  Refill hydrocodone.  She is sweating during the night on 3 or 4 nights but I think that is likely related to pain medicine.  I will see her back in 2 weeks with repeat radiographs.  Follow-Up Instructions: Return in about 2 weeks (around 09/23/2018).   Orders:  Orders Placed This Encounter  Procedures  . XR Shoulder Left   Meds ordered this encounter  Medications  . HYDROcodone-acetaminophen (NORCO) 10-325 MG tablet    Sig: 1 tablet by mouth every 4-6hrs as needed    Dispense:  40 tablet    Refill:  0    Imaging: Xr Shoulder Left  Result Date: 09/09/2018 2 views left shoulder reviewed.  Reverse shoulder replacement in good position alignment.  No change in small cortical fracture distal to the stem tip.  Overall no change in alignment or fracture compared to 1 week ago   PMFS History: Patient Active Problem List   Diagnosis Date Noted  . Shoulder arthritis 08/27/2018  . Primary osteoarthritis, left shoulder 04/01/2018  . Primary osteoarthritis, right shoulder 04/01/2018  . Essential hypertension 01/22/2017  . Controlled type 2 diabetes mellitus without  complication, without long-term current use of insulin ([place]) 11/23/2016  . Chronic pain of both shoulders 07/24/2016  . Prediabetes   . Anxiety   . Bipolar 1 disorder ([place])   . Depression   . Chronic back pain 11/07/2012  . Insomnia secondary to depression with anxiety 09/23/2012  . Unspecified vitamin D deficiency 09/23/2012  . COPD, mild ([place]) 07/01/2012  . Acute bronchitis 06/08/2012  . Adenocarcinoma of lung, stage 1 ([place]) 05/23/2012  . RECTAL BLEEDING 04/27/2010  . HEMORRHOIDS 03/11/2009  . ADENOCARCINOMA, BREAST 12/15/2008  . FIBROIDS, UTERUS 12/15/2008  . HYPERCHOLESTEROLEMIA 12/15/2008  . Hyperlipidemia 12/15/2008  . Anxiety and depression 12/15/2008  . GASTROESOPHAGEAL REFLUX DISEASE 12/15/2008  . GASTRITIS 12/15/2008  . CONSTIPATION 12/15/2008  . FLATULENCE ERUCTATION AND GAS PAIN 12/15/2008  . ABDOMINAL PAIN 12/15/2008  . RECTAL BLEEDING, HX OF 12/15/2008   Past Medical History:  Diagnosis Date  . Allergic rhinitis   . Anxiety   . Arthritis   . Bipolar 1 disorder ([place])   . Breast cancer ([place]) 2004  . Carpal tunnel syndrome of left wrist   . Chronic neck pain   . Chronic shoulder pain    L>R  . Depression   . GERD (gastroesophageal reflux disease)   . Headache(784.0)   . History of lung surgery   . Hyperlipidemia   .  Hypertension   . Lung cancer ([place])   . Lung nodule 2009  . Pain in both feet   . Personal history of radiation therapy 2004  . PONV (postoperative nausea and vomiting)   . Type 2 diabetes mellitus ([facility])     Family History  Problem Relation Age of Onset  . Heart disease Mother        CHF  . Physical abuse Mother   . Depression Mother   . Cancer Sister   . ADD / ADHD Sister   . Alcohol abuse Father   . Alcohol abuse Brother   . Mental illness Brother   . Cancer Sister   . Heart disease Sister   . Physical abuse Sister   . OCD Sister   . Mental illness Sister   . Anxiety disorder Neg Hx   . Bipolar disorder Neg Hx   . Dementia  Neg Hx   . Drug abuse Neg Hx   . Paranoid behavior Neg Hx   . Schizophrenia Neg Hx   . Seizures Neg Hx   . Sexual abuse Neg Hx     Past Surgical History:  Procedure Laterality Date  . BREAST SURGERY  2005   lumpectomy - left  . carpel tunnel Bilateral   . CESAREAN SECTION  1971  . COLONOSCOPY WITH PROPOFOL N/A 07/25/2016   Procedure: COLONOSCOPY WITH PROPOFOL;  Surgeon: Danie Binder, MD;  [facility];  Service: Endoscopy;  Laterality: N/A;  8:15 AM  . EYE SURGERY    . LUNG CANCER SURGERY Right 05/23/2012   reportedly clear  . TONSILLECTOMY    . TOTAL SHOULDER ARTHROPLASTY Left 08/27/2018   Procedure: LEFT TOTAL SHOULDER ARTHROPLASTY;  Surgeon: Meredith Pel, MD;  Location: [facility];  Service: Orthopedics;  Laterality: Left;  . TUBAL LIGATION     Social History   Occupational History  . Occupation: retired    Comment: Location manager  Tobacco Use  . Smoking status: Former Smoker    Packs/day: 1.00    Years: 10.00    Pack years: 10.00    Types: Cigarettes    Last attempt to quit: 08/14/1974    Years since quitting: 44.1  . Smokeless tobacco: Never Used  Substance and Sexual Activity  . Alcohol use: Yes    Comment: occasionally   . Drug use: Yes    Types: Marijuana    Comment: smoking daily  . Sexual activity: Never

## 2018-09-11 ENCOUNTER — Inpatient Hospital Stay (INDEPENDENT_AMBULATORY_CARE_PROVIDER_SITE_OTHER): Payer: Medicare Other | Admitting: Orthopedic Surgery

## 2018-09-23 ENCOUNTER — Ambulatory Visit (INDEPENDENT_AMBULATORY_CARE_PROVIDER_SITE_OTHER): Payer: Medicare Other

## 2018-09-23 ENCOUNTER — Ambulatory Visit (INDEPENDENT_AMBULATORY_CARE_PROVIDER_SITE_OTHER): Payer: Medicare Other | Admitting: Orthopedic Surgery

## 2018-09-23 ENCOUNTER — Encounter (INDEPENDENT_AMBULATORY_CARE_PROVIDER_SITE_OTHER): Payer: Self-pay | Admitting: Orthopedic Surgery

## 2018-09-23 VITALS — Ht 63.0 in | Wt 152.0 lb

## 2018-09-23 DIAGNOSIS — M25512 Pain in left shoulder: Secondary | ICD-10-CM | POA: Diagnosis not present

## 2018-09-23 MED ORDER — HYDROCODONE-ACETAMINOPHEN 10-325 MG PO TABS: ORAL_TABLET | ORAL | 0 refills | Status: DC

## 2018-09-23 MED ORDER — METHOCARBAMOL 500 MG PO TABS: 500.0000 mg | ORAL_TABLET | Freq: Three times a day (TID) | ORAL | 0 refills | Status: DC | PRN

## 2018-09-23 NOTE — Progress Notes (Signed)
Post-Op Visit Note   Patient: Deborah Mullins           Date of Birth: 1951-05-06           MRN: [id number] Visit Date: 09/23/2018 PCP: Susy Frizzle, MD   Assessment & Plan:  Chief Complaint:  Chief Complaint  Patient presents with  . Left Shoulder - Routine Post Op    08/27/2018 Left TSA   Visit Diagnoses:  1. Left shoulder pain, unspecified chronicity     Plan: Shanaye is a patient is now 4 weeks out left total shoulder replacement.  She is getting improved range of motion.  Not doing any lifting with that left arm.  Has occasional pain which is pretty severe at night.  On exam the incision looks intact.  Deltoid is functional.  Her passive range of motion has improved significantly since last visit.  On her continue not using the sling but increase the CPM degrees to about 60.  Come back in 2 weeks for clinical recheck and repeat radiographs.  She is able to do an axillary x-ray today.  Crackles still present but it does not look like it is progressed.  I will see her back in 2 weeks and at that time with documented stability of the fracture we can begin more aggressive range of motion exercises.  Still no lifting until I see her back.  Follow-Up Instructions: Return in about 2 weeks (around 10/07/2018).   Orders:  Orders Placed This Encounter  Procedures  . XR Shoulder Left   Meds ordered this encounter  Medications  . HYDROcodone-acetaminophen (NORCO) 10-325 MG tablet    Sig: 1 tablet by mouth every 4-6hrs as needed    Dispense:  45 tablet    Refill:  0  . DISCONTD: methocarbamol (ROBAXIN) 500 MG tablet    Sig: Take 1 tablet (500 mg total) by mouth every 8 (eight) hours as needed for muscle spasms.    Dispense:  30 tablet    Refill:  0  . methocarbamol (ROBAXIN) 500 MG tablet    Sig: Take 1 tablet (500 mg total) by mouth every 8 (eight) hours as needed for muscle spasms.    Dispense:  30 tablet    Refill:  0    Imaging: Xr Shoulder Left  Result Date:  09/23/2018 AP lateral axillary left shoulder reviewed.  Reverse shoulder prosthesis in good position alignment.  No change in fracture just distal to the prosthetic tip.   PMFS History: Patient Active Problem List   Diagnosis Date Noted  . Shoulder arthritis 08/27/2018  . Primary osteoarthritis, left shoulder 04/01/2018  . Primary osteoarthritis, right shoulder 04/01/2018  . Essential hypertension 01/22/2017  . Controlled type 2 diabetes mellitus without complication, without long-term current use of insulin ([place]) 11/23/2016  . Chronic pain of both shoulders 07/24/2016  . Prediabetes   . Anxiety   . Bipolar 1 disorder ([place])   . Depression   . Chronic back pain 11/07/2012  . Insomnia secondary to depression with anxiety 09/23/2012  . Unspecified vitamin D deficiency 09/23/2012  . COPD, mild ([place]) 07/01/2012  . Acute bronchitis 06/08/2012  . Adenocarcinoma of lung, stage 1 ([place]) 05/23/2012  . RECTAL BLEEDING 04/27/2010  . HEMORRHOIDS 03/11/2009  . ADENOCARCINOMA, BREAST 12/15/2008  . FIBROIDS, UTERUS 12/15/2008  . HYPERCHOLESTEROLEMIA 12/15/2008  . Hyperlipidemia 12/15/2008  . Anxiety and depression 12/15/2008  . GASTROESOPHAGEAL REFLUX DISEASE 12/15/2008  . GASTRITIS 12/15/2008  . CONSTIPATION 12/15/2008  . FLATULENCE ERUCTATION AND  GAS PAIN 12/15/2008  . ABDOMINAL PAIN 12/15/2008  . RECTAL BLEEDING, HX OF 12/15/2008   Past Medical History:  Diagnosis Date  . Allergic rhinitis   . Anxiety   . Arthritis   . Bipolar 1 disorder ([place])   . Breast cancer ([place]) 2004  . Carpal tunnel syndrome of left wrist   . Chronic neck pain   . Chronic shoulder pain    L>R  . Depression   . GERD (gastroesophageal reflux disease)   . Headache(784.0)   . History of lung surgery   . Hyperlipidemia   . Hypertension   . Lung cancer ([place])   . Lung nodule 2009  . Pain in both feet   . Personal history of radiation therapy 2004  . PONV (postoperative nausea and vomiting)   . Type 2  diabetes mellitus ([facility])     Family History  Problem Relation Age of Onset  . Heart disease Mother        CHF  . Physical abuse Mother   . Depression Mother   . Cancer Sister   . ADD / ADHD Sister   . Alcohol abuse Father   . Alcohol abuse Brother   . Mental illness Brother   . Cancer Sister   . Heart disease Sister   . Physical abuse Sister   . OCD Sister   . Mental illness Sister   . Anxiety disorder Neg Hx   . Bipolar disorder Neg Hx   . Dementia Neg Hx   . Drug abuse Neg Hx   . Paranoid behavior Neg Hx   . Schizophrenia Neg Hx   . Seizures Neg Hx   . Sexual abuse Neg Hx     Past Surgical History:  Procedure Laterality Date  . BREAST SURGERY  2005   lumpectomy - left  . carpel tunnel Bilateral   . CESAREAN SECTION  1971  . COLONOSCOPY WITH PROPOFOL N/A 07/25/2016   Procedure: COLONOSCOPY WITH PROPOFOL;  Surgeon: Danie Binder, MD;  [facility];  Service: Endoscopy;  Laterality: N/A;  8:15 AM  . EYE SURGERY    . LUNG CANCER SURGERY Right 05/23/2012   reportedly clear  . TONSILLECTOMY    . TOTAL SHOULDER ARTHROPLASTY Left 08/27/2018   Procedure: LEFT TOTAL SHOULDER ARTHROPLASTY;  Surgeon: Meredith Pel, MD;  Location: [facility];  Service: Orthopedics;  Laterality: Left;  . TUBAL LIGATION     Social History   Occupational History  . Occupation: retired    Comment: Location manager  Tobacco Use  . Smoking status: Former Smoker    Packs/day: 1.00    Years: 10.00    Pack years: 10.00    Types: Cigarettes    Last attempt to quit: 08/14/1974    Years since quitting: 44.1  . Smokeless tobacco: Never Used  Substance and Sexual Activity  . Alcohol use: Yes    Comment: occasionally   . Drug use: Yes    Types: Marijuana    Comment: smoking daily  . Sexual activity: Never

## 2018-10-07 ENCOUNTER — Encounter (INDEPENDENT_AMBULATORY_CARE_PROVIDER_SITE_OTHER): Payer: Self-pay | Admitting: Orthopedic Surgery

## 2018-10-07 ENCOUNTER — Ambulatory Visit (INDEPENDENT_AMBULATORY_CARE_PROVIDER_SITE_OTHER): Payer: Medicare Other | Admitting: Orthopedic Surgery

## 2018-10-07 ENCOUNTER — Ambulatory Visit (INDEPENDENT_AMBULATORY_CARE_PROVIDER_SITE_OTHER): Payer: Medicare Other

## 2018-10-07 DIAGNOSIS — M25512 Pain in left shoulder: Secondary | ICD-10-CM

## 2018-10-07 DIAGNOSIS — M19011 Primary osteoarthritis, right shoulder: Secondary | ICD-10-CM

## 2018-10-07 MED ORDER — HYDROCODONE-ACETAMINOPHEN 5-325 MG PO TABS: ORAL_TABLET | ORAL | 0 refills | Status: DC

## 2018-10-07 MED ORDER — METHOCARBAMOL 750 MG PO TABS: ORAL_TABLET | ORAL | 0 refills | Status: DC

## 2018-10-07 NOTE — Progress Notes (Signed)
Post-Op Visit Note   Patient: Deborah Mullins           Date of Birth: Mar 26, 1951           MRN: [id number] Visit Date: 10/07/2018 PCP: Susy Frizzle, MD   Assessment & Plan:  Chief Complaint:  Chief Complaint  Patient presents with  . Left Shoulder - Follow-up   Visit Diagnoses:  1. Left shoulder pain, unspecified chronicity   2. Primary osteoarthritis, right shoulder   Deborah Mullins is a 68 year old patient with left shoulder replacement reverse type replacement done 6 weeks ago.  This was complicated by nondisplaced fracture distal to the stem.  She has been doing rehab.  Been having some pain but this sounds more like normal postop pain.  On exam she has passive forward flexion abduction both past 90 degrees.  External rotation is to about 40 degrees.  The radiographs show some callus formation at the distal end of that crack just distal to the stem.  No settling of the prosthesis and no widening of the fracture.  Plan at this time is to go ahead and be a little bit more aggressive with her therapy.  I like for her to continue using the shoulder brace CPM as well as add a shoulder pulley to get a little bit more forward flexion.  5 pound lifting limit.  Husband wants to help do a little bit of external rotation which I think is okay because the patient is very reluctant to get outpatient physical therapy.  Based on where she is at this time with the range of motion I think that would be fine.  We will see her back in 4 weeks for clinical recheck.  Pain medicine and muscle relaxer refilled.  Plan: see above  Follow-Up Instructions: Return in about 4 weeks (around 11/04/2018).   Orders:  Orders Placed This Encounter  Procedures  . XR Shoulder Left   Meds ordered this encounter  Medications  . HYDROcodone-acetaminophen (NORCO/VICODIN) 5-325 MG tablet    Sig: 1 PO BID PRN PAIN    Dispense:  60 tablet    Refill:  0  . methocarbamol (ROBAXIN) 750 MG tablet    Sig: 1 PO QHS PRN SPASM      Dispense:  45 tablet    Refill:  0    Imaging: Xr Shoulder Left  Result Date: 10/07/2018 [facility] outlet axillary left shoulder reviewed.  Reverse shoulder replacement in good position alignment with no complicating features other than distal fracture to the stem which is unchanged in appearance.  Some callus formation is noted on the axillary view.  Impression is located reverse shoulder replacement in good position alignment with healing fracture unchanged in alignment and appearance.  The fracture is just distal to the stem tip.   PMFS History: Patient Active Problem List   Diagnosis Date Noted  . Shoulder arthritis 08/27/2018  . Primary osteoarthritis, left shoulder 04/01/2018  . Primary osteoarthritis, right shoulder 04/01/2018  . Essential hypertension 01/22/2017  . Controlled type 2 diabetes mellitus without complication, without long-term current use of insulin ([place]) 11/23/2016  . Chronic pain of both shoulders 07/24/2016  . Prediabetes   . Anxiety   . Bipolar 1 disorder ([place])   . Depression   . Chronic back pain 11/07/2012  . Insomnia secondary to depression with anxiety 09/23/2012  . Unspecified vitamin D deficiency 09/23/2012  . COPD, mild ([place]) 07/01/2012  . Acute bronchitis 06/08/2012  . Adenocarcinoma of lung, stage 1 ([place])  05/23/2012  . RECTAL BLEEDING 04/27/2010  . HEMORRHOIDS 03/11/2009  . ADENOCARCINOMA, BREAST 12/15/2008  . FIBROIDS, UTERUS 12/15/2008  . HYPERCHOLESTEROLEMIA 12/15/2008  . Hyperlipidemia 12/15/2008  . Anxiety and depression 12/15/2008  . GASTROESOPHAGEAL REFLUX DISEASE 12/15/2008  . GASTRITIS 12/15/2008  . CONSTIPATION 12/15/2008  . FLATULENCE ERUCTATION AND GAS PAIN 12/15/2008  . ABDOMINAL PAIN 12/15/2008  . RECTAL BLEEDING, HX OF 12/15/2008   Past Medical History:  Diagnosis Date  . Allergic rhinitis   . Anxiety   . Arthritis   . Bipolar 1 disorder ([place])   . Breast cancer ([place]) 2004  . Carpal tunnel syndrome of left wrist   .  Chronic neck pain   . Chronic shoulder pain    L>R  . Depression   . GERD (gastroesophageal reflux disease)   . Headache(784.0)   . History of lung surgery   . Hyperlipidemia   . Hypertension   . Lung cancer ([place])   . Lung nodule 2009  . Pain in both feet   . Personal history of radiation therapy 2004  . PONV (postoperative nausea and vomiting)   . Type 2 diabetes mellitus (HCC)     Family History  Problem Relation Age of Onset  . Heart disease Mother        CHF  . Physical abuse Mother   . Depression Mother   . Cancer Sister   . ADD / ADHD Sister   . Alcohol abuse Father   . Alcohol abuse Brother   . Mental illness Brother   . Cancer Sister   . Heart disease Sister   . Physical abuse Sister   . OCD Sister   . Mental illness Sister   . Anxiety disorder Neg Hx   . Bipolar disorder Neg Hx   . Dementia Neg Hx   . Drug abuse Neg Hx   . Paranoid behavior Neg Hx   . Schizophrenia Neg Hx   . Seizures Neg Hx   . Sexual abuse Neg Hx     Past Surgical History:  Procedure Laterality Date  . BREAST SURGERY  2005   lumpectomy - left  . carpel tunnel Bilateral   . CESAREAN SECTION  1971  . COLONOSCOPY WITH PROPOFOL N/A 07/25/2016   Procedure: COLONOSCOPY WITH PROPOFOL;  Surgeon: Danie Binder, MD;  [facility];  Service: Endoscopy;  Laterality: N/A;  8:15 AM  . EYE SURGERY    . LUNG CANCER SURGERY Right 05/23/2012   reportedly clear  . TONSILLECTOMY    . TOTAL SHOULDER ARTHROPLASTY Left 08/27/2018   Procedure: LEFT TOTAL SHOULDER ARTHROPLASTY;  Surgeon: Meredith Pel, MD;  Location: [facility];  Service: Orthopedics;  Laterality: Left;  . TUBAL LIGATION     Social History   Occupational History  . Occupation: retired    Comment: Location manager  Tobacco Use  . Smoking status: Former Smoker    Packs/day: 1.00    Years: 10.00    Pack years: 10.00    Types: Cigarettes    Last attempt to quit: 08/14/1974    Years since quitting: 44.1  . Smokeless  tobacco: Never Used  Substance and Sexual Activity  . Alcohol use: Yes    Comment: occasionally   . Drug use: Yes    Types: Marijuana    Comment: smoking daily  . Sexual activity: Never

## 2018-10-08 ENCOUNTER — Other Ambulatory Visit: Payer: Self-pay

## 2018-10-08 MED ORDER — ROSUVASTATIN CALCIUM 40 MG PO TABS: 40.0000 mg | ORAL_TABLET | Freq: Every day | ORAL | 1 refills | Status: AC

## 2018-10-15 ENCOUNTER — Other Ambulatory Visit: Payer: Medicare Other

## 2018-10-15 DIAGNOSIS — N183 Chronic kidney disease, stage 3 unspecified: Secondary | ICD-10-CM

## 2018-10-15 DIAGNOSIS — I10 Essential (primary) hypertension: Secondary | ICD-10-CM

## 2018-10-15 DIAGNOSIS — E785 Hyperlipidemia, unspecified: Secondary | ICD-10-CM

## 2018-10-15 DIAGNOSIS — I2510 Atherosclerotic heart disease of native coronary artery without angina pectoris: Secondary | ICD-10-CM

## 2018-10-15 DIAGNOSIS — E119 Type 2 diabetes mellitus without complications: Secondary | ICD-10-CM | POA: Diagnosis not present

## 2018-10-16 ENCOUNTER — Other Ambulatory Visit: Payer: Self-pay | Admitting: Family Medicine

## 2018-10-16 ENCOUNTER — Encounter: Payer: Self-pay | Admitting: Family Medicine

## 2018-10-16 DIAGNOSIS — F329 Major depressive disorder, single episode, unspecified: Secondary | ICD-10-CM

## 2018-10-16 DIAGNOSIS — F419 Anxiety disorder, unspecified: Principal | ICD-10-CM

## 2018-10-16 LAB — COMPLETE METABOLIC PANEL WITH GFR
AG Ratio: 2 (calc) (ref 1.0–2.5)
ALT: 16 U/L (ref 6–29)
AST: 23 U/L (ref 10–35)
Albumin: 4.5 g/dL (ref 3.6–5.1)
Alkaline phosphatase (APISO): 88 U/L (ref 37–153)
BUN: 14 mg/dL (ref 7–25)
CO2: 32 mmol/L (ref 20–32)
CREATININE: 0.98 mg/dL (ref 0.50–0.99)
Calcium: 9.8 mg/dL (ref 8.6–10.4)
Chloride: 105 mmol/L (ref 98–110)
GFR, Est African American: 70 mL/min/{1.73_m2} (ref >=60)
GFR, Est Non African American: 60 mL/min/{1.73_m2} (ref >=60)
GLOBULIN: 2.3 g/dL (ref 1.9–3.7)
Glucose, Bld: 92 mg/dL (ref 65–99)
Potassium: 4.4 mmol/L (ref 3.5–5.3)
Sodium: 141 mmol/L (ref 135–146)
Total Bilirubin: 0.3 mg/dL (ref 0.2–1.2)
Total Protein: 6.8 g/dL (ref 6.1–8.1)

## 2018-10-16 LAB — CBC WITH DIFFERENTIAL/PLATELET
Absolute Monocytes: 552 cells/uL (ref 200–950)
BASOS PCT: 0.7 %
Basophils Absolute: 42 cells/uL (ref 0–200)
Eosinophils Absolute: 108 cells/uL (ref 15–500)
Eosinophils Relative: 1.8 %
HEMATOCRIT: 38.8 % (ref 35.0–45.0)
Hemoglobin: 12.2 g/dL (ref 11.7–15.5)
Lymphs Abs: 1992 cells/uL (ref 850–3900)
MCH: 28.7 pg (ref 27.0–33.0)
MCHC: 31.4 g/dL — ABNORMAL LOW (ref 32.0–36.0)
MCV: 91.3 fL (ref 80.0–100.0)
MPV: 10.6 fL (ref 7.5–12.5)
Monocytes Relative: 9.2 %
NEUTROS ABS: 3306 {cells}/uL (ref 1500–7800)
Neutrophils Relative %: 55.1 %
Platelets: 298 10*3/uL (ref 140–400)
RBC: 4.25 10*6/uL (ref 3.80–5.10)
RDW: 12.3 % (ref 11.0–15.0)
Total Lymphocyte: 33.2 %
WBC: 6 10*3/uL (ref 3.8–10.8)

## 2018-10-16 LAB — LIPID PANEL
Cholesterol: 156 mg/dL (ref <200)
HDL: 70 mg/dL (ref >=50)
LDL CHOLESTEROL (CALC): 67 mg/dL
Non-HDL Cholesterol (Calc): 86 mg/dL (calc) (ref <130)
TRIGLYCERIDES: 111 mg/dL (ref <150)
Total CHOL/HDL Ratio: 2.2 (calc) (ref <5.0)

## 2018-10-16 LAB — HEMOGLOBIN A1C
Hgb A1c MFr Bld: 5.4 % of total Hgb (ref <5.7)
MEAN PLASMA GLUCOSE: 108 (calc)
eAG (mmol/L): 6 (calc)

## 2018-10-16 MED ORDER — BUPROPION HCL ER (SR) 150 MG PO TB12: 150.0000 mg | ORAL_TABLET | Freq: Two times a day (BID) | ORAL | 1 refills | Status: DC

## 2018-10-18 ENCOUNTER — Ambulatory Visit (INDEPENDENT_AMBULATORY_CARE_PROVIDER_SITE_OTHER): Payer: Medicare Other | Admitting: Family Medicine

## 2018-10-18 ENCOUNTER — Encounter: Payer: Self-pay | Admitting: Family Medicine

## 2018-10-18 VITALS — BP 122/84 | HR 92 | Temp 97.6°F | Resp 16 | Ht 63.0 in | Wt 150.5 lb

## 2018-10-18 DIAGNOSIS — J449 Chronic obstructive pulmonary disease, unspecified: Secondary | ICD-10-CM

## 2018-10-18 DIAGNOSIS — F17200 Nicotine dependence, unspecified, uncomplicated: Secondary | ICD-10-CM

## 2018-10-18 DIAGNOSIS — R21 Rash and other nonspecific skin eruption: Secondary | ICD-10-CM | POA: Diagnosis not present

## 2018-10-18 DIAGNOSIS — E119 Type 2 diabetes mellitus without complications: Secondary | ICD-10-CM | POA: Diagnosis not present

## 2018-10-18 DIAGNOSIS — Z85118 Personal history of other malignant neoplasm of bronchus and lung: Secondary | ICD-10-CM | POA: Diagnosis not present

## 2018-10-18 DIAGNOSIS — I10 Essential (primary) hypertension: Secondary | ICD-10-CM

## 2018-10-18 DIAGNOSIS — F419 Anxiety disorder, unspecified: Secondary | ICD-10-CM

## 2018-10-18 DIAGNOSIS — E785 Hyperlipidemia, unspecified: Secondary | ICD-10-CM | POA: Diagnosis not present

## 2018-10-18 DIAGNOSIS — F329 Major depressive disorder, single episode, unspecified: Secondary | ICD-10-CM

## 2018-10-18 MED ORDER — TRIAMCINOLONE ACETONIDE 0.1 % EX OINT: 1.0000 "application " | TOPICAL_OINTMENT | Freq: Two times a day (BID) | CUTANEOUS | 0 refills | Status: DC

## 2018-10-18 MED ORDER — METFORMIN HCL 500 MG PO TABS: 500.0000 mg | ORAL_TABLET | Freq: Two times a day (BID) | ORAL | 1 refills | Status: AC

## 2018-10-18 MED ORDER — LOSARTAN POTASSIUM 50 MG PO TABS: 50.0000 mg | ORAL_TABLET | Freq: Every day | ORAL | 3 refills | Status: DC

## 2018-10-18 MED ORDER — FLUOXETINE HCL 40 MG PO CAPS: 40.0000 mg | ORAL_CAPSULE | Freq: Every day | ORAL | 1 refills | Status: AC

## 2018-10-18 MED ORDER — PANTOPRAZOLE SODIUM 40 MG PO TBEC: 40.0000 mg | DELAYED_RELEASE_TABLET | Freq: Every day | ORAL | 1 refills | Status: DC

## 2018-10-18 NOTE — Progress Notes (Signed)
Patient ID: Deborah Mullins, female    DOB: Apr 30, 1951, 68 y.o.   MRN: [id number]  PCP: Delsa Grana, PA-C  Chief Complaint  Patient presents with  . Hypertension    Subjective:   Deborah Mullins is a 68 y.o. female, presents to clinic with CC of f/up for multiple chronic conditions, HTN, HLD, COPD, DM  HLD - taking crestor, no SE, no particular diet right now, she has been loosing weight, walking a little bit, no recent/large unintentional weightloss.  HTN - Losartan pt states compliant with meds, no SE, not monitoring, no near syncope, CP, orthopnea, PND, LE edema.  DM - taking metformin BID, no SE, CBG's checks occasionally sometimes 80-122, has been "low" (80) even sometimes after eating  Shes walking more, doing PT for her left arm  Hx of Lung cancer, started smoking again,  In the past couple months, about 1/2 ppd Walking no SOB, with "wrestling" or other more strenuous activity she gets SOB.  Not using inhalers very frequently  Wt Readings from Last 10 Encounters:  10/18/18 150 lb 8 oz (68.3 kg)  09/23/18 152 lb (68.9 kg)  08/27/18 152 lb (68.9 kg)  08/22/18 152 lb 8 oz (69.2 kg)  07/10/18 151 lb (68.5 kg)  06/19/18 153 lb (69.4 kg)  05/27/18 255 lb 3.2 oz (115.8 kg)  03/11/18 160 lb (72.6 kg)  10/13/17 165 lb (74.8 kg)  10/10/17 165 lb (74.8 kg)   Started smoking a few months ago, smoked 40 years ago (1 ppd, for 10), now 1/2 ppd again for a few months.  Hx of lung cancer with resection, she does not know if there is any surveillance plan.  She denies night sweats, CP, hemoptysis.   Her prozac and wellbutrin have been working well for her mood, anxiety and depression (dx of Bipolar 1, anxiety, depression on chart).  She has an additional family member living with her who is a alcoholic - this has added some stress to her life, and she has been smoking again because of it, but she has been handling her mood and actions well with her meds. Depression screen [facility] 2/9  10/18/2018 06/19/2018 05/27/2018  Decreased Interest 1 0 0  Down, Depressed, Hopeless 0 0 0  PHQ - 2 Score 1 0 0  Altered sleeping 3 - -  Tired, decreased energy 3 - -  Change in appetite 2 - -  Feeling bad or failure about yourself  0 - -  Trouble concentrating 2 - -  Moving slowly or fidgety/restless 0 - -  Suicidal thoughts 0 - -  PHQ-9 Score 11 - -  Difficult doing work/chores Somewhat difficult - -  Some recent data might be hidden   She did run out of prozac and was having difficulty getting refill while transitioning pharmacies.  She has been having some itchy patches to both her hands that starts small, is itchy and burning, and as she scratches it it becomes little larger patches, thickened, callous looking.  No redness, swelling, vesicles.  She wants to make sure its not shingles  Labs done prior to visit, results printed and reviewed with the pt today.    Patient Active Problem List   Diagnosis Date Noted  . Shoulder arthritis 08/27/2018  . Primary osteoarthritis, left shoulder 04/01/2018  . Primary osteoarthritis, right shoulder 04/01/2018  . Essential hypertension 01/22/2017  . Controlled type 2 diabetes mellitus without complication, without long-term current use of insulin ([place]) 11/23/2016  . Chronic  pain of both shoulders 07/24/2016  . Prediabetes   . Anxiety   . Bipolar 1 disorder ([place])   . Depression   . Chronic back pain 11/07/2012  . Insomnia secondary to depression with anxiety 09/23/2012  . Unspecified vitamin D deficiency 09/23/2012  . COPD, mild ([place]) 07/01/2012  . Acute bronchitis 06/08/2012  . Adenocarcinoma of lung, stage 1 ([place]) 05/23/2012  . RECTAL BLEEDING 04/27/2010  . HEMORRHOIDS 03/11/2009  . ADENOCARCINOMA, BREAST 12/15/2008  . FIBROIDS, UTERUS 12/15/2008  . HYPERCHOLESTEROLEMIA 12/15/2008  . Hyperlipidemia 12/15/2008  . Anxiety and depression 12/15/2008  . GASTROESOPHAGEAL REFLUX DISEASE 12/15/2008  . GASTRITIS 12/15/2008  .  CONSTIPATION 12/15/2008  . FLATULENCE ERUCTATION AND GAS PAIN 12/15/2008  . ABDOMINAL PAIN 12/15/2008  . RECTAL BLEEDING, HX OF 12/15/2008    Current Meds  Medication Sig  . albuterol (PROVENTIL HFA;VENTOLIN HFA) 108 (90 Base) MCG/ACT inhaler Inhale 2 puffs into the lungs every 4 (four) hours as needed for wheezing or shortness of breath.  Marland Kitchen aspirin 81 MG tablet Take 1 tablet (81 mg total) by mouth daily.  . blood glucose meter kit and supplies KIT Check up to twice a day before meals  . buPROPion (WELLBUTRIN SR) 150 MG 12 hr tablet Take 1 tablet (150 mg total) by mouth 2 (two) times daily.  . cetirizine (ZYRTEC) 10 MG tablet Take 1 tablet (10 mg total) by mouth daily.  Marland Kitchen FLUoxetine (PROZAC) 40 MG capsule Take 1 capsule (40 mg total) by mouth daily.  . fluticasone (FLONASE) 50 MCG/ACT nasal spray Place 2 sprays into both nostrils daily. (Patient taking differently: Place 2 sprays into both nostrils daily as needed for allergies. )  . gabapentin (NEURONTIN) 800 MG tablet Take 1 tablet (800 mg total) by mouth 2 (two) times daily.  Marland Kitchen glucose blood test strip Check up to twice a day  . HYDROcodone-acetaminophen (NORCO/VICODIN) 5-325 MG tablet 1 PO BID PRN PAIN  . Lancets (ONETOUCH ULTRASOFT) lancets Use as instructed  . losartan (COZAAR) 50 MG tablet Take 1 tablet (50 mg total) by mouth daily.  . methocarbamol (ROBAXIN) 750 MG tablet 1 PO QHS PRN SPASM  . pantoprazole (PROTONIX) 40 MG tablet Take 1 tablet (40 mg total) by mouth daily.  . rosuvastatin (CRESTOR) 40 MG tablet Take 1 tablet (40 mg total) by mouth daily.  . [DISCONTINUED] FLUoxetine (PROZAC) 40 MG capsule TAKE 1 CAPSULE BY MOUTH EVERY DAY (Patient taking differently: Take 40 mg by mouth daily. )  . [DISCONTINUED] losartan (COZAAR) 50 MG tablet Take 1 tablet (50 mg total) by mouth daily.  . [DISCONTINUED] methocarbamol (ROBAXIN) 500 MG tablet Take 1 tablet (500 mg total) by mouth every 8 (eight) hours as needed for muscle spasms.  .  [DISCONTINUED] pantoprazole (PROTONIX) 40 MG tablet TAKE 1 TABLET BY MOUTH EVERY DAY (Patient taking differently: Take 40 mg by mouth daily. )     Review of Systems  Constitutional: Negative.   HENT: Negative.   Eyes: Negative.   Respiratory: Negative.   Cardiovascular: Negative.   Gastrointestinal: Negative.   Endocrine: Negative.   Genitourinary: Negative.   Musculoskeletal: Negative.   Skin: Negative.   Allergic/Immunologic: Negative.   Neurological: Negative.   Hematological: Negative.   Psychiatric/Behavioral: Negative.   All other systems reviewed and are negative.      Objective:    Vitals:   10/18/18 1021  BP: 122/84  Pulse: 92  Resp: 16  Temp: 97.6 F (36.4 C)  TempSrc: Oral  SpO2: 97%  Weight:  150 lb 8 oz (68.3 kg)  Height: _0  (1.6 m)      Physical Exam Vitals signs and nursing note reviewed.  Constitutional:      General: She is not in acute distress.    Appearance: Normal appearance. She is well-developed. She is not toxic-appearing or diaphoretic.     Comments: Chronically ill appearing female  HENT:     Head: Normocephalic and atraumatic.     Right Ear: External ear normal.     Left Ear: External ear normal.     Nose: Nose normal. No congestion or rhinorrhea.     Mouth/Throat:     Mouth: Mucous membranes are moist.     Pharynx: Uvula midline. No oropharyngeal exudate or posterior oropharyngeal erythema.  Eyes:     General: Lids are normal. No scleral icterus.       Right eye: No discharge.        Left eye: No discharge.     Conjunctiva/sclera: Conjunctivae normal.     Pupils: Pupils are equal, round, and reactive to light.  Neck:     Musculoskeletal: Normal range of motion and neck supple.     Trachea: Phonation normal. No tracheal deviation.  Cardiovascular:     Rate and Rhythm: Normal rate and regular rhythm.     Pulses: Normal pulses.          Radial pulses are 2+ on the right side and 2+ on the left side.       Posterior tibial  pulses are 2+ on the right side and 2+ on the left side.     Heart sounds: Normal heart sounds. No murmur. No friction rub. No gallop.   Pulmonary:     Effort: Pulmonary effort is normal. No respiratory distress.     Breath sounds: Normal breath sounds. No stridor. No wheezing, rhonchi or rales.  Chest:     Chest wall: No tenderness.  Abdominal:     General: Bowel sounds are normal. There is no distension.     Palpations: Abdomen is soft.     Tenderness: There is no abdominal tenderness. There is no guarding or rebound.  Musculoskeletal: Normal range of motion.        General: No deformity.  Lymphadenopathy:     Cervical: No cervical adenopathy.  Skin:    General: Skin is warm and dry.     Capillary Refill: Capillary refill takes less than 2 seconds.     Coloration: Skin is not jaundiced.     Findings: No rash.  Neurological:     Mental Status: She is alert and oriented to person, place, and time.     Motor: No weakness or abnormal muscle tone.     Coordination: Coordination normal.     Gait: Gait normal.  Psychiatric:        Mood and Affect: Mood normal.        Speech: Speech normal.        Behavior: Behavior normal.     Results for orders placed or performed in visit on 10/15/18  Lipid panel  Result Value Ref Range   Cholesterol 156 <200 mg/dL   HDL 70 > OR = 50 mg/dL   Triglycerides 111 <150 mg/dL   LDL Cholesterol (Calc) 67 mg/dL (calc)   Total CHOL/HDL Ratio 2.2 <5.0 (calc)   Non-HDL Cholesterol (Calc) 86 <130 mg/dL (calc)  Hemoglobin A1c  Result Value Ref Range   Hgb A1c MFr Bld 5.4 <5.7 % of total  Hgb   Mean Plasma Glucose 108 (calc)   eAG (mmol/L) 6.0 (calc)  CBC with Differential/Platelet  Result Value Ref Range   WBC 6.0 3.8 - 10.8 Thousand/uL   RBC 4.25 3.80 - 5.10 Million/uL   Hemoglobin 12.2 11.7 - 15.5 g/dL   HCT 38.8 35.0 - 45.0 %   MCV 91.3 80.0 - 100.0 fL   MCH 28.7 27.0 - 33.0 pg   MCHC 31.4 (L) 32.0 - 36.0 g/dL   RDW 12.3 11.0 - 15.0 %    Platelets 298 140 - 400 Thousand/uL   MPV 10.6 7.5 - 12.5 fL   Neutro Abs 3,306 1,500 - 7,800 cells/uL   Lymphs Abs 1,992 850 - 3,900 cells/uL   Absolute Monocytes 552 200 - 950 cells/uL   Eosinophils Absolute 108 15 - 500 cells/uL   Basophils Absolute 42 0 - 200 cells/uL   Neutrophils Relative % 55.1 %   Total Lymphocyte 33.2 %   Monocytes Relative 9.2 %   Eosinophils Relative 1.8 %   Basophils Relative 0.7 %  COMPLETE METABOLIC PANEL WITH GFR  Result Value Ref Range   Glucose, Bld 92 65 - 99 mg/dL   BUN 14 7 - 25 mg/dL   Creat 0.98 0.50 - 0.99 mg/dL   GFR, Est Non African American 60 > OR = 60 mL/min/1.22m   GFR, Est African American 70 > OR = 60 mL/min/1.722m  BUN/Creatinine Ratio NOT APPLICABLE 6 - 22 (calc)   Sodium 141 135 - 146 mmol/L   Potassium 4.4 3.5 - 5.3 mmol/L   Chloride 105 98 - 110 mmol/L   CO2 32 20 - 32 mmol/L   Calcium 9.8 8.6 - 10.4 mg/dL   Total Protein 6.8 6.1 - 8.1 g/dL   Albumin 4.5 3.6 - 5.1 g/dL   Globulin 2.3 1.9 - 3.7 g/dL (calc)   AG Ratio 2.0 1.0 - 2.5 (calc)   Total Bilirubin 0.3 0.2 - 1.2 mg/dL   Alkaline phosphatase (APISO) 88 37 - 153 U/L   AST 23 10 - 35 U/L   ALT 16 6 - 29 U/L         Assessment & Plan:   Pt is a [age]/o female presents for routine visit for multiple chronic conditions and also complains of rash   Problem List Items Addressed This Visit      Cardiovascular and Mediastinum   Essential hypertension - Primary    Compliant with med, no SE, refills sent to correct pharmacy BP well controlled and at goal today 122/84 Renal function and electrolytes normal Continue meds F/up 3 months      Relevant Medications   losartan (COZAAR) 50 MG tablet     Respiratory   COPD, mild (HCC)    Started smoking again, advised to stop SOB with only very strenuous activity, no SOB with walking or stairs Not using inhaler very often PFT testing reviewed from 2013 and 2015 - will recheck if respiratory sx change or worsen         Endocrine   Controlled type 2 diabetes mellitus without complication, without long-term [place] use of insulin [place]   Compliant with meds, no SE, metformin BID Renal function good, monitoring sugars few times a week A1C 5.4 - can decrease metformin to daily On ARB, statin and ASA       Relevant Medications   metFORMIN (GLUCOPHAGE) 500 MG tablet   losartan (COZAAR) 50 MG tablet     Other   Anxiety  and depression (Chronic)   Relevant Medications   FLUoxetine (PROZAC) 40 MG capsule   Hyperlipidemia    Compliant with meds, no SE Labs completed and reviewed with pt, LDL at goal LFT's normal  Continue meds, cholesterol/heart diet and AHA exercise guidelines      Relevant Medications   losartan (COZAAR) 50 MG tablet    Other Visit Diagnoses    Rash and nonspecific skin eruption       Current smoker       discussed smoking cessation   Hx of cancer of lung       watch weight/SOB sx - with gradual loss, mild SOB, VATS 2013, no surveillance, monitor, review chart to see if any scans indicated     Thickened rash on hands/fingers/palms - itchy burning - trial of topical steroid ointment     Delsa Grana, PA-C 10/18/18 10:55 AM

## 2018-10-18 NOTE — Assessment & Plan Note (Signed)
Compliant with meds, no SE Labs completed and reviewed with pt, LDL at goal LFT's normal  Continue meds, cholesterol/heart diet and AHA exercise guidelines

## 2018-10-18 NOTE — Assessment & Plan Note (Signed)
Compliant with med, no SE, refills sent to correct pharmacy BP well controlled and at goal today 122/84 Renal function and electrolytes normal Continue meds F/up 3 months

## 2018-10-18 NOTE — Assessment & Plan Note (Signed)
Started smoking again, advised to stop SOB with only very strenuous activity, no SOB with walking or stairs Not using inhaler very often PFT testing reviewed from 2013 and 2015 - will recheck if respiratory sx change or worsen

## 2018-10-18 NOTE — Assessment & Plan Note (Signed)
Compliant with meds, no SE, metformin BID Renal function good, monitoring sugars few times a week A1C 5.4 - can decrease metformin to daily On ARB, statin and ASA

## 2018-11-01 ENCOUNTER — Telehealth (INDEPENDENT_AMBULATORY_CARE_PROVIDER_SITE_OTHER): Payer: Self-pay | Admitting: Orthopedic Surgery

## 2018-11-01 ENCOUNTER — Telehealth (INDEPENDENT_AMBULATORY_CARE_PROVIDER_SITE_OTHER): Payer: Self-pay | Admitting: *Deleted

## 2018-11-01 NOTE — Telephone Encounter (Signed)
Called pt on home and mobile number, both numbers were unable to lvm. Called pt husband listed on DPR and voicemail box was not set up.   Unable to pre screen covid-19 questions.

## 2018-11-04 ENCOUNTER — Other Ambulatory Visit: Payer: Self-pay

## 2018-11-04 ENCOUNTER — Encounter (INDEPENDENT_AMBULATORY_CARE_PROVIDER_SITE_OTHER): Payer: Self-pay | Admitting: Orthopedic Surgery

## 2018-11-04 ENCOUNTER — Ambulatory Visit (INDEPENDENT_AMBULATORY_CARE_PROVIDER_SITE_OTHER): Payer: Medicare Other | Admitting: Orthopedic Surgery

## 2018-11-04 DIAGNOSIS — M25512 Pain in left shoulder: Secondary | ICD-10-CM

## 2018-11-04 DIAGNOSIS — Z96612 Presence of left artificial shoulder joint: Secondary | ICD-10-CM

## 2018-11-04 MED ORDER — METHOCARBAMOL 750 MG PO TABS: ORAL_TABLET | ORAL | 0 refills | Status: DC

## 2018-11-04 MED ORDER — HYDROCODONE-ACETAMINOPHEN 5-325 MG PO TABS: ORAL_TABLET | ORAL | 0 refills | Status: DC

## 2018-11-04 NOTE — Progress Notes (Signed)
Post-Op Visit Note   Patient: Deborah Mullins           Date of Birth: [birth year]           MRN: [id number] Visit Date: 11/04/2018 PCP: Delsa Grana, PA-C   Assessment & Plan:  Chief Complaint:  Chief Complaint  Patient presents with  . Left Shoulder - Follow-up   Visit Diagnoses:  1. Left shoulder pain, unspecified chronicity     Plan: Patient is now about 10 weeks out left reverse replacement.  She has been doing reasonably well.  She had a small crack in her humeral shaft below the prosthesis but that is healed.  She is doing home exercise program.  She is taking narcotic medication about twice a day which is less than what she has been doing.  On examination she has forward flexion abduction both above 90 degrees.  She can put her hand on her head.  She does have a little bit of pain when she puts all of her weight on that left arm and she is getting into bed.  I do not think that is a great thing for her to do at this point.  Plan at this time is to refill Robaxin and Norco continue with home exercise program.  Start tapering the narcotic from twice a day to 1 a day after about another 3 or so weeks.  Come back in 2 months for clinical recheck plain x-rays for final check and likely release.  Follow-Up Instructions: Return in about 8 weeks (around 12/30/2018).   Orders:  No orders of the defined types were placed in this encounter.  Meds ordered this encounter  Medications  . HYDROcodone-acetaminophen (NORCO/VICODIN) 5-325 MG tablet    Sig: 1 po bid    Dispense:  60 tablet    Refill:  0  . methocarbamol (ROBAXIN) 750 MG tablet    Sig: 1 po bid prn    Dispense:  60 tablet    Refill:  0    Imaging: No results found.  PMFS History: Patient Active Problem List   Diagnosis Date Noted  . Shoulder arthritis 08/27/2018  . Primary osteoarthritis, left shoulder 04/01/2018  . Primary osteoarthritis, right shoulder 04/01/2018  . Essential hypertension 01/22/2017  .  Controlled type 2 diabetes mellitus without complication, without long-term current use of insulin ([place]) 11/23/2016  . Chronic pain of both shoulders 07/24/2016  . Prediabetes   . Anxiety   . Bipolar 1 disorder ([place])   . Depression   . Chronic back pain 11/07/2012  . Insomnia secondary to depression with anxiety 09/23/2012  . Unspecified vitamin D deficiency 09/23/2012  . COPD, mild ([place]) 07/01/2012  . Acute bronchitis 06/08/2012  . Adenocarcinoma of lung, stage 1 ([place]) 05/23/2012  . RECTAL BLEEDING 04/27/2010  . HEMORRHOIDS 03/11/2009  . ADENOCARCINOMA, BREAST 12/15/2008  . FIBROIDS, UTERUS 12/15/2008  . HYPERCHOLESTEROLEMIA 12/15/2008  . Hyperlipidemia 12/15/2008  . Anxiety and depression 12/15/2008  . GASTROESOPHAGEAL REFLUX DISEASE 12/15/2008  . GASTRITIS 12/15/2008  . CONSTIPATION 12/15/2008  . FLATULENCE ERUCTATION AND GAS PAIN 12/15/2008  . ABDOMINAL PAIN 12/15/2008  . RECTAL BLEEDING, HX OF 12/15/2008   Past Medical History:  Diagnosis Date  . Allergic rhinitis   . Anxiety   . Arthritis   . Bipolar 1 disorder ([place])   . Breast cancer ([place]) 2004  . Carpal tunnel syndrome of left wrist   . Chronic neck pain   . Chronic shoulder pain    L>R  .  Depression   . GERD (gastroesophageal reflux disease)   . Headache(784.0)   . History of lung surgery   . Hyperlipidemia   . Hypertension   . Lung cancer ([place])   . Lung nodule 2009  . Pain in both feet   . Personal history of radiation therapy 2004  . PONV (postoperative nausea and vomiting)   . Type 2 diabetes mellitus ([facility])     Family History  Problem Relation Age of Onset  . Heart disease Mother        CHF  . Physical abuse Mother   . Depression Mother   . Cancer Sister   . ADD / ADHD Sister   . Alcohol abuse Father   . Alcohol abuse Brother   . Mental illness Brother   . Cancer Sister   . Heart disease Sister   . Physical abuse Sister   . OCD Sister   . Mental illness Sister   . Anxiety disorder Neg Hx    . Bipolar disorder Neg Hx   . Dementia Neg Hx   . Drug abuse Neg Hx   . Paranoid behavior Neg Hx   . Schizophrenia Neg Hx   . Seizures Neg Hx   . Sexual abuse Neg Hx     Past Surgical History:  Procedure Laterality Date  . BREAST SURGERY  2005   lumpectomy - left  . carpel tunnel Bilateral   . CESAREAN SECTION  1971  . COLONOSCOPY WITH PROPOFOL N/A 07/25/2016   Procedure: COLONOSCOPY WITH PROPOFOL;  Surgeon: Danie Binder, MD;  Location[facility];  Service: Endoscopy;  Laterality: N/A;  8:15 AM  . EYE SURGERY    . LUNG CANCER SURGERY Right 05/23/2012   reportedly clear  . TONSILLECTOMY    . TOTAL SHOULDER ARTHROPLASTY Left 08/27/2018   Procedure: LEFT TOTAL SHOULDER ARTHROPLASTY;  Surgeon: Meredith Pel, MD;  Location: [facility];  Service: Orthopedics;  Laterality: Left;  . TUBAL LIGATION     Social History   Occupational History  . Occupation: retired    Comment: Location manager  Tobacco Use  . Smoking status: Former Smoker    Packs/day: 1.00    Years: 10.00    Pack years: 10.00    Types: Cigarettes    Last attempt to quit: 08/14/1974    Years since quitting: 44.2  . Smokeless tobacco: Never Used  Substance and Sexual Activity  . Alcohol use: Yes    Comment: occasionally   . Drug use: Yes    Types: Marijuana    Comment: smoking daily  . Sexual activity: Never

## 2018-11-07 ENCOUNTER — Telehealth (INDEPENDENT_AMBULATORY_CARE_PROVIDER_SITE_OTHER): Payer: Self-pay | Admitting: Orthopedic Surgery

## 2018-11-07 NOTE — Telephone Encounter (Signed)
I tried calling. Number disconnected/no longer in service.

## 2018-11-07 NOTE — Telephone Encounter (Signed)
Patient called and wanted Lauren to know that she was of. She was told by Dr. Marlou Sa to call and let you know.

## 2018-11-26 ENCOUNTER — Other Ambulatory Visit: Payer: Self-pay

## 2018-11-26 DIAGNOSIS — J302 Other seasonal allergic rhinitis: Secondary | ICD-10-CM

## 2018-11-26 DIAGNOSIS — F419 Anxiety disorder, unspecified: Secondary | ICD-10-CM

## 2018-11-26 DIAGNOSIS — F329 Major depressive disorder, single episode, unspecified: Secondary | ICD-10-CM

## 2018-11-26 DIAGNOSIS — G629 Polyneuropathy, unspecified: Secondary | ICD-10-CM

## 2018-11-26 DIAGNOSIS — F32A Depression, unspecified: Secondary | ICD-10-CM

## 2018-11-26 NOTE — Telephone Encounter (Signed)
Requested Prescriptions   Pending Prescriptions Disp Refills  . cetirizine (ZYRTEC) 10 MG tablet 30 tablet 11    Sig: Take 1 tablet (10 mg total) by mouth daily.  Marland Kitchen buPROPion (WELLBUTRIN SR) 150 MG 12 hr tablet 180 tablet 1    Sig: Take 1 tablet (150 mg total) by mouth 2 (two) times daily.  Marland Kitchen gabapentin (NEURONTIN) 800 MG tablet 180 tablet 1    Sig: Take 1 tablet (800 mg total) by mouth 2 (two) times daily.   Pt switched pharmacies to [facility] on [address] and would like her Rxs to them.  Last OV 10/18/2018

## 2018-11-27 ENCOUNTER — Other Ambulatory Visit: Payer: Self-pay

## 2018-11-27 MED ORDER — BUPROPION HCL ER (SR) 150 MG PO TB12: 150.0000 mg | ORAL_TABLET | Freq: Two times a day (BID) | ORAL | 1 refills | Status: AC

## 2018-11-27 MED ORDER — GABAPENTIN 800 MG PO TABS: 800.0000 mg | ORAL_TABLET | Freq: Two times a day (BID) | ORAL | 1 refills | Status: AC

## 2018-11-27 MED ORDER — CETIRIZINE HCL 10 MG PO TABS: 10.0000 mg | ORAL_TABLET | Freq: Every day | ORAL | 11 refills | Status: AC

## 2018-11-27 MED ORDER — PANTOPRAZOLE SODIUM 40 MG PO TBEC: 40.0000 mg | DELAYED_RELEASE_TABLET | Freq: Every day | ORAL | 1 refills | Status: AC

## 2018-11-27 NOTE — Telephone Encounter (Signed)
All meds chronic, no changes for several years, started by other providers, pt has multiple recent visits, safe to refill all as is.

## 2018-12-20 ENCOUNTER — Telehealth: Payer: Self-pay | Admitting: Orthopedic Surgery

## 2018-12-20 NOTE — Telephone Encounter (Signed)
Please advise. Thanks.  

## 2018-12-20 NOTE — Telephone Encounter (Signed)
Patient called and stated that needs refill of Hydrocodone 5-325 and Robaxin  Please call patient to advise.  [phone]

## 2018-12-23 MED ORDER — METHOCARBAMOL 500 MG PO TABS: 500.0000 mg | ORAL_TABLET | Freq: Two times a day (BID) | ORAL | 0 refills | Status: DC | PRN

## 2018-12-23 MED ORDER — HYDROCODONE-ACETAMINOPHEN 5-325 MG PO TABS: ORAL_TABLET | ORAL | 0 refills | Status: DC

## 2018-12-23 NOTE — Telephone Encounter (Signed)
Submitted robaxin. Norco printed. Holding for Dr Marlou Sa to sign on Wednesday when he returns to clinic. Will call patient to advise can pick up once signed

## 2018-12-23 NOTE — Telephone Encounter (Signed)
Okay for Norco 12/15/2023 1 p.o. daily as needed pain #30 with no refills and Robaxin 500 twice a day as needed for spasm #60 with no refills

## 2018-12-24 ENCOUNTER — Ambulatory Visit (INDEPENDENT_AMBULATORY_CARE_PROVIDER_SITE_OTHER): Payer: Medicare Other | Admitting: Family Medicine

## 2018-12-24 ENCOUNTER — Encounter: Payer: Self-pay | Admitting: Family Medicine

## 2018-12-24 ENCOUNTER — Other Ambulatory Visit: Payer: Self-pay

## 2018-12-24 VITALS — BP 144/88 | HR 74 | Temp 97.8°F | Resp 16 | Ht 63.0 in | Wt 153.6 lb

## 2018-12-24 DIAGNOSIS — M25511 Pain in right shoulder: Secondary | ICD-10-CM | POA: Diagnosis not present

## 2018-12-24 DIAGNOSIS — G8929 Other chronic pain: Secondary | ICD-10-CM | POA: Diagnosis not present

## 2018-12-24 MED ORDER — HYDROCODONE-ACETAMINOPHEN 5-325 MG PO TABS: 1.0000 | ORAL_TABLET | Freq: Two times a day (BID) | ORAL | 0 refills | Status: AC | PRN

## 2018-12-24 MED ORDER — METHOCARBAMOL 500 MG PO TABS: 500.0000 mg | ORAL_TABLET | Freq: Three times a day (TID) | ORAL | 0 refills | Status: DC | PRN

## 2018-12-24 NOTE — Patient Instructions (Signed)
Follow up with ortho - keep calling to see when they can see you.  If you continue to require narcotic pain medication and other sedating medication, we may need a pain specialist to help in order to safely manage your symptoms.   Shoulder Pain Many things can cause shoulder pain, including:  An injury.  Moving the shoulder in the same way again and again (overuse).  Joint pain (arthritis). Pain can come from:  Swelling and irritation (inflammation) of any part of the shoulder.  An injury to the shoulder joint.  An injury to: ? Tissues that connect muscle to bone (tendons). ? Tissues that connect bones to each other (ligaments). ? Bones. Follow these instructions at home: Watch for changes in your symptoms. Let your doctor know about them. Follow these instructions to help with your pain. If you have a sling:  Wear the sling as told by your doctor. Remove it only as told by your doctor.  Loosen the sling if your fingers: ? Tingle. ? Become numb. ? Turn cold and blue.  Keep the sling clean.  If the sling is not waterproof: ? Do not let it get wet. ? Take the sling off when you shower or bathe. Managing pain, stiffness, and swelling   If told, put ice on the painful area: ? Put ice in a plastic bag. ? Place a towel between your skin and the bag. ? Leave the ice on for 20 minutes, 2-3 times a day. Stop putting ice on if it does not help with the pain.  Squeeze a soft ball or a foam pad as much as possible. This prevents swelling in the shoulder. It also helps to strengthen the arm. General instructions  Take over-the-counter and prescription medicines only as told by your doctor.  Keep all follow-up visits as told by your doctor. This is important. Contact a doctor if:  Your pain gets worse.  Medicine does not help your pain.  You have new pain in your arm, hand, or fingers. Get help right away if:  Your arm, hand, or fingers: ? Tingle. ? Are numb. ? Are  swollen. ? Are painful. ? Turn white or blue. Summary  Shoulder pain can be caused by many things. These include injury, moving the shoulder in the same away again and again, and joint pain.  Watch for changes in your symptoms. Let your doctor know about them.  This condition may be treated with a sling, ice, and pain medicine.  Contact your doctor if the pain gets worse or you have new pain. Get help right away if your arm, hand, or fingers tingle or get numb, swollen, or painful.  Keep all follow-up visits as told by your doctor. This is important. This information is not intended to replace advice given to you by your health care provider. Make sure you discuss any questions you have with your health care provider. Document Released: 01/17/2008 Document Revised: 02/12/2018 Document Reviewed: 02/12/2018 Elsevier Interactive Patient Education  2019 [employer].

## 2018-12-24 NOTE — Progress Notes (Signed)
Patient ID: Deborah Mullins, female    DOB: [birth year], 68 y.o.   MRN: [id number]  PCP: Delsa Grana, PA-C  Chief Complaint  Patient presents with  . Shoulder Pain    needs surgery but office is closed, pt states she need something for pain, taken hydrocodone and tylenol    Subjective:   Deborah Mullins is a 68 y.o. female, presents to clinic with CC of chronic right shoulder pain, left shoulder continues to hurt as well.  She has hx of arthritis, sees Dr. Marlou Sa.  She states because of COVID she cannot see him and she is having dificulty reaching their office on the phone.  She also notes that she was told by ortho that PCP would have to manage pain meds.  Pt states she did not get any meds from ortho.   Per chart review, I can see phone call messages and Rx for norco and robaxin.  The pt has not obtained these and was not aware of them.  Controlled substance database checked and last meds were in March with Norco 5-325 with # 1. Pt notes constant right shoulder pain that throbs and is worse at night, its waking her up, she cannot go back to sleep without the pain meds and muscle relaxers.  She does continue to take gabapentin.  Pain moderate to severe, associated with decrease range of motion, stiffness.  She denies any new injury, denies any associated swelling, redness, numbness, weakness.  HPI    Patient Active Problem List   Diagnosis Date Noted  . Shoulder arthritis 08/27/2018  . Primary osteoarthritis, left shoulder 04/01/2018  . Primary osteoarthritis, right shoulder 04/01/2018  . Essential hypertension 01/22/2017  . Controlled type 2 diabetes mellitus without complication, without long-term current use of insulin ([place]) 11/23/2016  . Chronic pain of both shoulders 07/24/2016  . Prediabetes   . Anxiety   . Bipolar 1 disorder ([place])   . Depression   . Chronic back pain 11/07/2012  . Insomnia secondary to depression with anxiety 09/23/2012  . Unspecified vitamin D deficiency  09/23/2012  . COPD, mild ([place]) 07/01/2012  . Acute bronchitis 06/08/2012  . Adenocarcinoma of lung, stage 1 ([place]) 05/23/2012  . RECTAL BLEEDING 04/27/2010  . HEMORRHOIDS 03/11/2009  . ADENOCARCINOMA, BREAST 12/15/2008  . FIBROIDS, UTERUS 12/15/2008  . HYPERCHOLESTEROLEMIA 12/15/2008  . Hyperlipidemia 12/15/2008  . Anxiety and depression 12/15/2008  . GASTROESOPHAGEAL REFLUX DISEASE 12/15/2008  . GASTRITIS 12/15/2008  . CONSTIPATION 12/15/2008  . FLATULENCE ERUCTATION AND GAS PAIN 12/15/2008  . ABDOMINAL PAIN 12/15/2008  . RECTAL BLEEDING, HX OF 12/15/2008     Prior to Admission medications   Medication Sig Start Date End Date Taking? Authorizing Provider  albuterol (PROVENTIL HFA;VENTOLIN HFA) 108 (90 Base) MCG/ACT inhaler Inhale 2 puffs into the lungs every 4 (four) hours as needed for wheezing or shortness of breath. 06/19/18  Yes Delsa Grana, PA-C  aspirin 81 MG tablet Take 1 tablet (81 mg total) by mouth daily. 01/22/17  Yes Dena Billet B, PA-C  blood glucose meter kit and supplies KIT Check up to twice a day before meals 04/18/17  Yes Dixon, Lonie Peak, PA-C  buPROPion [facility] SR) 150 MG 12 hr tablet Take 1 tablet (150 mg total) by mouth 2 (two) times daily. 11/27/18  Yes Delsa Grana, PA-C  cetirizine (ZYRTEC) 10 MG tablet Take 1 tablet (10 mg total) by mouth daily. 11/27/18  Yes Delsa Grana, PA-C  FLUoxetine (PROZAC) 40 MG capsule Take 1 capsule (  40 mg total) by mouth daily. 10/18/18  Yes Delsa Grana, PA-C  fluticasone (FLONASE) 50 MCG/ACT nasal spray Place 2 sprays into both nostrils daily. Patient taking differently: Place 2 sprays into both nostrils daily as needed for allergies.  10/11/16  Yes Eustaquio Maize, MD  gabapentin (NEURONTIN) 800 MG tablet Take 1 tablet (800 mg total) by mouth 2 (two) times daily. 11/27/18  Yes Delsa Grana, PA-C  glucose blood test strip Check up to twice a day 01/23/17  Yes Susy Frizzle, MD  Lancets [facility] ULTRASOFT) lancets Use as instructed  10/31/16  Yes Eustaquio Maize, MD  losartan (COZAAR) 50 MG tablet Take 1 tablet (50 mg total) by mouth daily. 10/18/18  Yes Delsa Grana, PA-C  metFORMIN (GLUCOPHAGE) 500 MG tablet Take 1 tablet (500 mg total) by mouth 2 (two) times daily with a meal. 10/18/18  Yes Delsa Grana, PA-C  pantoprazole (PROTONIX) 40 MG tablet Take 1 tablet (40 mg total) by mouth daily. 11/27/18  Yes Delsa Grana, PA-C  rosuvastatin (CRESTOR) 40 MG tablet Take 1 tablet (40 mg total) by mouth daily. 10/08/18  Yes Susy Frizzle, MD  HYDROcodone-acetaminophen (NORCO/VICODIN) 5-325 MG tablet 1 po bid Patient not taking: Reported on 12/24/2018 11/04/18   Meredith Pel, MD  HYDROcodone-acetaminophen (NORCO/VICODIN) 5-325 MG tablet 1 po q d prn pain Patient not taking: Reported on 12/24/2018 12/23/18   Meredith Pel, MD  methocarbamol (ROBAXIN) 500 MG tablet Take 1 tablet (500 mg total) by mouth 2 (two) times daily as needed for muscle spasms. Patient not taking: Reported on 12/24/2018 12/23/18   Meredith Pel, MD  methocarbamol (ROBAXIN) 750 MG tablet 1 PO QHS PRN SPASM Patient not taking: Reported on 12/24/2018 10/07/18   Meredith Pel, MD  triamcinolone ointment (KENALOG) 0.1 % Apply 1 application topically 2 (two) times daily. Patient not taking: Reported on 12/24/2018 10/18/18   Delsa Grana, PA-C     Allergies  Allergen Reactions  . Bee Venom Anaphylaxis  . Penicillins Rash and Other (See Comments)    PATIENT HAS HAD A PCN REACTION WITH IMMEDIATE RASH, FACIAL/TONGUE/THROAT SWELLING, SOB, OR LIGHTHEADEDNESS WITH HYPOTENSION:  #  #  YES  #  #  Has patient had a PCN reaction causing severe rash involving mucus membranes or skin necrosis: No Has patient had a PCN reaction that required hospitalization No Has patient had a PCN reaction occurring within the last 10 years: No       Family History  Problem Relation Age of Onset  . Heart disease Mother        CHF  . Physical abuse Mother   . Depression  Mother   . Cancer Sister   . ADD / ADHD Sister   . Alcohol abuse Father   . Alcohol abuse Brother   . Mental illness Brother   . Cancer Sister   . Heart disease Sister   . Physical abuse Sister   . OCD Sister   . Mental illness Sister   . Anxiety disorder Neg Hx   . Bipolar disorder Neg Hx   . Dementia Neg Hx   . Drug abuse Neg Hx   . Paranoid behavior Neg Hx   . Schizophrenia Neg Hx   . Seizures Neg Hx   . Sexual abuse Neg Hx      Social History   Socioeconomic History  . Marital status: Married    Spouse name: Eddie Dibbles  . Number of children: 1  . Years  of education: 8  . Highest education level: Not on file  Occupational History  . Occupation: retired    Comment: Location manager  Social Needs  . Financial resource strain: Not on file  . Food insecurity:    Worry: Not on file    Inability: Not on file  . Transportation needs:    Medical: Not on file    Non-medical: Not on file  Tobacco Use  . Smoking status: Former Smoker    Packs/day: 1.00    Years: 10.00    Pack years: 10.00    Types: Cigarettes    Last attempt to quit: 08/14/1974    Years since quitting: 44.3  . Smokeless tobacco: Never Used  Substance and Sexual Activity  . Alcohol use: Yes    Comment: occasionally   . Drug use: Yes    Types: Marijuana    Comment: smoking daily  . Sexual activity: Never  Lifestyle  . Physical activity:    Days per week: Not on file    Minutes per session: Not on file  . Stress: Not on file  Relationships  . Social connections:    Talks on phone: Not on file    Gets together: Not on file    Attends religious service: Not on file    Active member of club or organization: Not on file    Attends meetings of clubs or organizations: Not on file    Relationship status: Not on file  . Intimate partner violence:    Fear of current or ex partner: Not on file    Emotionally abused: Not on file    Physically abused: Not on file    Forced sexual activity: Not on file   Other Topics Concern  . Not on file  Social History Narrative   Married.    Both pt and her husband have bipolar. She says he "Gets moody and leaves for a while" then comes back.    "Not very supportive."   Caffeine use- 2 cups/day     Review of Systems  Constitutional: Negative.   HENT: Negative.   Eyes: Negative.   Respiratory: Negative.   Cardiovascular: Negative.   Gastrointestinal: Negative.   Endocrine: Negative.   Genitourinary: Negative.   Musculoskeletal: Negative.   Skin: Negative.   Allergic/Immunologic: Negative.   Neurological: Negative.   Hematological: Negative.   Psychiatric/Behavioral: Negative.   All other systems reviewed and are negative.      Objective:    Vitals:   12/24/18 1010  BP: (!) 144/88  Pulse: 74  Resp: 16  Temp: 97.8 F (36.6 C)  SpO2: 97%  Weight: 153 lb 9.6 oz (69.7 kg)  Height: '5\' 3"'$  (1.6 m)      Physical Exam Vitals signs and nursing note reviewed.  Constitutional:      General: She is not in acute distress.    Appearance: She is well-developed. She is not ill-appearing or toxic-appearing.  HENT:     Head: Normocephalic and atraumatic.     Nose: Nose normal.  Eyes:     General:        Right eye: No discharge.        Left eye: No discharge.     Conjunctiva/sclera: Conjunctivae normal.  Neck:     Trachea: No tracheal deviation.  Cardiovascular:     Rate and Rhythm: Normal rate and regular rhythm.  Pulmonary:     Effort: Pulmonary effort is normal. No respiratory distress.  Breath sounds: No stridor.  Musculoskeletal:     Right shoulder: She exhibits decreased range of motion and tenderness. She exhibits no bony tenderness, no swelling, no effusion, no crepitus, no deformity, normal pulse and normal strength.     Left shoulder: She exhibits decreased range of motion. She exhibits no tenderness, no bony tenderness, no swelling, no effusion, no crepitus, no deformity, no spasm, normal pulse and normal strength.      Comments: Right shoulder, pt will not flex passively or actively more than 90 degrees - gives resistance She will not do any special tests (internal rotation, drop test, empty can test) Tension bilaterally in upper trapezius  Skin:    General: Skin is warm and dry.     Findings: No rash.  Neurological:     Mental Status: She is alert.     Motor: No abnormal muscle tone.     Coordination: Coordination normal.  Psychiatric:        Behavior: Behavior normal.           Assessment & Plan:      ICD-10-CM   1. Chronic right shoulder pain M25.511 HYDROcodone-acetaminophen (NORCO/VICODIN) 5-325 MG tablet   G89.29 methocarbamol (ROBAXIN) 500 MG tablet    Controlled substance database consulted, no narcotics since end of March.  Does appear that Dr. Marlou Sa refilled meds, but not picked up.  Pharmacy contacted to only fill my Rx today.  Urged pt to keep trying to f/up with ortho. May need pain management for her chronic pain, already on gabapentin, muscle relaxers and narcotics for multiple locations of chronic pain.      Delsa Grana, PA-C 12/24/18 10:25 AM

## 2019-01-21 ENCOUNTER — Ambulatory Visit (INDEPENDENT_AMBULATORY_CARE_PROVIDER_SITE_OTHER): Payer: Medicare Other | Admitting: Family Medicine

## 2019-01-21 ENCOUNTER — Other Ambulatory Visit: Payer: Self-pay

## 2019-01-21 ENCOUNTER — Encounter: Payer: Self-pay | Admitting: Family Medicine

## 2019-01-21 ENCOUNTER — Telehealth: Payer: Self-pay | Admitting: Family Medicine

## 2019-01-21 VITALS — BP 134/78 | HR 93 | Temp 98.3°F | Resp 18 | Ht 63.0 in | Wt 149.2 lb

## 2019-01-21 DIAGNOSIS — R21 Rash and other nonspecific skin eruption: Secondary | ICD-10-CM | POA: Diagnosis not present

## 2019-01-21 DIAGNOSIS — W57XXXA Bitten or stung by nonvenomous insect and other nonvenomous arthropods, initial encounter: Secondary | ICD-10-CM | POA: Diagnosis not present

## 2019-01-21 MED ORDER — EPINEPHRINE 0.3 MG/0.3ML IJ SOAJ: 0.3000 mg | INTRAMUSCULAR | 1 refills | Status: AC | PRN

## 2019-01-21 NOTE — Patient Instructions (Signed)
For allergic reactions to bites/food/etc  Take Benadryl 50 mg once by mouth, can repeat 25 mg benadryl in 4-6 hours Take pepcid 20 mg once  If having shortness of breath or feel your throat, tongue or airway is swelling shut, call 911 immediately    Anaphylactic Reaction, Adult An anaphylactic reaction (anaphylaxis) is a sudden, serious allergic reaction. This affects more than one part of your body. It can be life-threatening. If you have an anaphylactic reaction, you need to get medical help right away. What are the causes? This condition is caused by exposure to things that give you an allergic reaction (allergens). Common allergens include:  Foods, such as peanuts, wheat, shellfish, milk, and eggs.  Medicines.  Insect bites or stings.  Blood or parts of blood received for treatment (transfusions).  Chemicals, such as latex and dyes that are used in food and in medical tests. What are the signs or symptoms? Signs of an anaphylactic reaction may include:  Feeling warm in the face (flushed). Your face may turn red.  Itchy, red, swollen areas of skin (hives).  Swelling of the: ? Eyes. ? Lips. ? Face. ? Mouth. ? Tongue. ? Throat.  Trouble with any of these: ? Breathing. ? Talking. ? Swallowing.  Loud breathing (wheezing).  Feeling dizzy or light-headed.  Passing out (fainting).  Pain or cramps in your belly.  Throwing up (vomiting).  Watery poop (diarrhea). How is this diagnosed? This condition is diagnosed based on:  Your symptoms.  A physical exam.  Blood tests.  Recent exposure to things that give you an allergic reaction. How is this treated? If you think you are having an anaphylactic reaction, you should do this right away:  Give yourself a shot of medicine (epinephrine) using an auto-injector "pen." Your doctor will teach you how to use this pen.  Call for emergency help. If you use a pen, you must still get treated in the hospital. There,  you may be given: ? Medicines. ? Oxygen. ? Fluids in an IV tube. Follow these instructions at home: Safety  Always keep an auto-injector pen with you. This could save your life. Use it as told by your doctor.  Do not drive after a reaction. Wait until your doctor says it is safe to drive.  Make sure that you, the people who live with you, and your employer know: ? What you are allergic to, so you can stay away from it. ? How to use your auto-injector pen.  Wear a bracelet or necklace that says you have an allergy, if your doctor tells you to do this.  Learn the signs of a very bad allergic reaction. This way, you can treat it right away.  Work with your doctors to make a plan for what to do if you have a very bad reaction. It is important to be ready. If you use your auto-injector pen:   Get more medicine (epinephrine) for your pen right away. This is important in case you have another reaction.  Get help right away. To avoid a serious allergic reaction:  Avoid things that gave you a very bad allergic reaction before.  Tell your server about your allergy when you go out to eat. If you are not sure if your meal has food that you are allergic to, ask your server before you eat it. General instructions  Take over-the-counter and prescription medicines only as told by your doctor.  If you have itchy, red, swollen areas of skin or a  rash: ? Use an over-the-counter medicine (antihistamine) as told by your doctor. ? Put cold, wet cloths on your skin. ? Take a cool bath or shower. Avoid hot water.  Tell all doctors who care for you that you have an allergy.  Keep all follow-up visits as told by your doctor. This is important. Get help right away if:  You have signs of an allergic reaction. You may notice them soon after being exposed to things that give you an allergic reaction. Signs may include: ? Warmth in your face. Your face may turn red. ? Itchy, red, swollen areas of  skin. ? Swelling of your:  Eyes.  Lips.  Face.  Mouth.  Tongue.  Throat. ? Trouble with any of these:  Breathing.  Talking.  Swallowing. ? Loud breathing (wheezing). ? Feeling dizzy or light-headed. ? Passing out. ? Pain or cramps in your belly. ? Throwing up. ? Watery poop.  You had to use your auto-injector pen. You must go to the emergency room even if the medicine seems to be working. This is because another allergic reaction may happen within 3 days (rebound anaphylaxis). These symptoms may be an emergency. Do not wait to see if the symptoms will go away. Do this right away:  Use your auto-injector pen as you have been told.  Get medical help. Call your local emergency services (911 in the [place] Do not drive yourself to the hospital. Summary  An anaphylactic reaction (anaphylaxis) is a sudden, serious allergic reaction.  This condition can be life-threatening. If you have a reaction, get medical help right away.  Your doctor will show you how to give yourself a shot (epinephrine injection) with an auto-injector "pen."  Always keep an auto-injector pen with you. It could save your life. Use it as told by your doctor.  If you had to use your auto-injector pen, you must go to the emergency room. Go there even if the medicine seems to be working. This information is not intended to replace advice given to you by your health care provider. Make sure you discuss any questions you have with your health care provider. Document Released: 01/17/2008 Document Revised: 11/22/2017 Document Reviewed: 11/22/2017 Elsevier Interactive Patient Education  [employer].

## 2019-01-21 NOTE — Progress Notes (Signed)
Patient ID: Deborah Mullins, female    DOB: 03-15-1951, 68 y.o.   MRN: [id number]  PCP: Delsa Grana, PA-C  Chief Complaint  Patient presents with  . Insect Bite    need epipen    Subjective:   Deborah Mullins is a 68 y.o. female, presents to clinic with CC of rash and itching from mosquito bites and also had wasp sting to right thumb and wants epi pen for allergic reaction.  She has bites and scratches and scabs all over her lower legs.  The swelling has improved to her right thumb, but there is still some itching.  She reports having anaphylactic reaction and ER visit before.  States she wants an epi pen.  Does not currently have one and she denies having any progressive spreading rash or swelling after wasp sting.  She denies any swelling or rash to face, lips, eyes.  Denies any sensation of swelling in mouth or throat, no SOB, stridor, wheeze (different from baseline).    Patient Active Problem List   Diagnosis Date Noted  . Shoulder arthritis 08/27/2018  . Primary osteoarthritis, left shoulder 04/01/2018  . Primary osteoarthritis, right shoulder 04/01/2018  . Essential hypertension 01/22/2017  . Controlled type 2 diabetes mellitus without complication, without long-term current use of insulin ([place]) 11/23/2016  . Chronic pain of both shoulders 07/24/2016  . Prediabetes   . Anxiety   . Bipolar 1 disorder ([place])   . Depression   . Chronic back pain 11/07/2012  . Insomnia secondary to depression with anxiety 09/23/2012  . Unspecified vitamin D deficiency 09/23/2012  . COPD, mild ([place]) 07/01/2012  . Acute bronchitis 06/08/2012  . Adenocarcinoma of lung, stage 1 ([place]) 05/23/2012  . RECTAL BLEEDING 04/27/2010  . HEMORRHOIDS 03/11/2009  . ADENOCARCINOMA, BREAST 12/15/2008  . FIBROIDS, UTERUS 12/15/2008  . HYPERCHOLESTEROLEMIA 12/15/2008  . Hyperlipidemia 12/15/2008  . Anxiety and depression 12/15/2008  . GASTROESOPHAGEAL REFLUX DISEASE 12/15/2008  . GASTRITIS 12/15/2008  .  CONSTIPATION 12/15/2008  . FLATULENCE ERUCTATION AND GAS PAIN 12/15/2008  . ABDOMINAL PAIN 12/15/2008  . RECTAL BLEEDING, HX OF 12/15/2008     Prior to Admission medications   Medication Sig Start Date End Date Taking? Authorizing Provider  albuterol (PROVENTIL HFA;VENTOLIN HFA) 108 (90 Base) MCG/ACT inhaler Inhale 2 puffs into the lungs every 4 (four) hours as needed for wheezing or shortness of breath. 06/19/18  Yes Delsa Grana, PA-C  aspirin 81 MG tablet Take 1 tablet (81 mg total) by mouth daily. 01/22/17  Yes Dena Billet B, PA-C  blood glucose meter kit and supplies KIT Check up to twice a day before meals 04/18/17  Yes Dixon, Lonie Peak, PA-C  buPROPion [facility] SR) 150 MG 12 hr tablet Take 1 tablet (150 mg total) by mouth 2 (two) times daily. 11/27/18  Yes Delsa Grana, PA-C  cetirizine (ZYRTEC) 10 MG tablet Take 1 tablet (10 mg total) by mouth daily. 11/27/18  Yes Delsa Grana, PA-C  FLUoxetine (PROZAC) 40 MG capsule Take 1 capsule (40 mg total) by mouth daily. 10/18/18  Yes Delsa Grana, PA-C  fluticasone (FLONASE) 50 MCG/ACT nasal spray Place 2 sprays into both nostrils daily. Patient taking differently: Place 2 sprays into both nostrils daily as needed for allergies.  10/11/16  Yes Eustaquio Maize, MD  gabapentin (NEURONTIN) 800 MG tablet Take 1 tablet (800 mg total) by mouth 2 (two) times daily. 11/27/18  Yes Delsa Grana, PA-C  glucose blood test strip Check up to twice a day  01/23/17  Yes Susy Frizzle, MD  Lancets [facility] ULTRASOFT) lancets Use as instructed 10/31/16  Yes Eustaquio Maize, MD  losartan (COZAAR) 50 MG tablet Take 1 tablet (50 mg total) by mouth daily. 10/18/18  Yes Delsa Grana, PA-C  metFORMIN (GLUCOPHAGE) 500 MG tablet Take 1 tablet (500 mg total) by mouth 2 (two) times daily with a meal. 10/18/18  Yes Delsa Grana, PA-C  methocarbamol (ROBAXIN) 500 MG tablet Take 1 tablet (500 mg total) by mouth every 8 (eight) hours as needed for muscle spasms. 12/24/18  Yes Delsa Grana, PA-C  pantoprazole (PROTONIX) 40 MG tablet Take 1 tablet (40 mg total) by mouth daily. 11/27/18  Yes Delsa Grana, PA-C  rosuvastatin (CRESTOR) 40 MG tablet Take 1 tablet (40 mg total) by mouth daily. 10/08/18  Yes Susy Frizzle, MD  methocarbamol (ROBAXIN) 750 MG tablet 1 PO QHS PRN SPASM Patient not taking: Reported on 12/24/2018 10/07/18   Meredith Pel, MD  triamcinolone ointment (KENALOG) 0.1 % Apply 1 application topically 2 (two) times daily. Patient not taking: Reported on 12/24/2018 10/18/18   Delsa Grana, PA-C     Allergies  Allergen Reactions  . Bee Venom Anaphylaxis  . Penicillins Rash and Other (See Comments)    PATIENT HAS HAD A PCN REACTION WITH IMMEDIATE RASH, FACIAL/TONGUE/THROAT SWELLING, SOB, OR LIGHTHEADEDNESS WITH HYPOTENSION:  #  #  YES  #  #  Has patient had a PCN reaction causing severe rash involving mucus membranes or skin necrosis: No Has patient had a PCN reaction that required hospitalization No Has patient had a PCN reaction occurring within the last 10 years: No       Family History  Problem Relation Age of Onset  . Heart disease Mother        CHF  . Physical abuse Mother   . Depression Mother   . Cancer Sister   . ADD / ADHD Sister   . Alcohol abuse Father   . Alcohol abuse Brother   . Mental illness Brother   . Cancer Sister   . Heart disease Sister   . Physical abuse Sister   . OCD Sister   . Mental illness Sister   . Anxiety disorder Neg Hx   . Bipolar disorder Neg Hx   . Dementia Neg Hx   . Drug abuse Neg Hx   . Paranoid behavior Neg Hx   . Schizophrenia Neg Hx   . Seizures Neg Hx   . Sexual abuse Neg Hx      Social History   Socioeconomic History  . Marital status: Married    Spouse name: Eddie Dibbles  . Number of children: 1  . Years of education: 8  . Highest education level: Not on file  Occupational History  . Occupation: retired    Comment: Location manager  Social Needs  . Financial resource strain: Not  on file  . Food insecurity:    Worry: Not on file    Inability: Not on file  . Transportation needs:    Medical: Not on file    Non-medical: Not on file  Tobacco Use  . Smoking status: Former Smoker    Packs/day: 1.00    Years: 10.00    Pack years: 10.00    Types: Cigarettes    Last attempt to quit: 08/14/1974    Years since quitting: 44.4  . Smokeless tobacco: Never Used  Substance and Sexual Activity  . Alcohol use: Yes    Comment:  occasionally   . Drug use: Yes    Types: Marijuana    Comment: smoking daily  . Sexual activity: Never  Lifestyle  . Physical activity:    Days per week: Not on file    Minutes per session: Not on file  . Stress: Not on file  Relationships  . Social connections:    Talks on phone: Not on file    Gets together: Not on file    Attends religious service: Not on file    Active member of club or organization: Not on file    Attends meetings of clubs or organizations: Not on file    Relationship status: Not on file  . Intimate partner violence:    Fear of current or ex partner: Not on file    Emotionally abused: Not on file    Physically abused: Not on file    Forced sexual activity: Not on file  Other Topics Concern  . Not on file  Social History Narrative   Married.    Both pt and her husband have bipolar. She says he "Gets moody and leaves for a while" then comes back.    "Not very supportive."   Caffeine use- 2 cups/day     Review of Systems  Constitutional: Negative.   HENT: Negative.   Eyes: Negative.   Respiratory: Negative.   Cardiovascular: Negative.   Gastrointestinal: Negative.   Endocrine: Negative.   Genitourinary: Negative.   Musculoskeletal: Negative.   Skin: Negative.   Allergic/Immunologic: Negative.   Neurological: Negative.   Hematological: Negative.   Psychiatric/Behavioral: Negative.   All other systems reviewed and are negative.      Objective:    Vitals:   01/21/19 1021  BP: 134/78  Pulse: 93   Resp: 18  Temp: 98.3 F (36.8 C)  SpO2: 97%  Weight: 149 lb 3.2 oz (67.7 kg)  Height: '5\' 3"'$  (1.6 m)      Physical Exam Vitals signs and nursing note reviewed.  Constitutional:      General: She is not in acute distress.    Appearance: She is well-developed. She is obese. She is not ill-appearing, toxic-appearing or diaphoretic.  HENT:     Head: Normocephalic and atraumatic.     Right Ear: External ear normal.     Left Ear: External ear normal.     Nose: Nose normal. No congestion or rhinorrhea.     Mouth/Throat:     Mouth: Mucous membranes are dry.     Pharynx: Oropharynx is clear. No oropharyngeal exudate or posterior oropharyngeal erythema.     Comments: Airway patent, normal phonation, no stridor Eyes:     General: No scleral icterus.       Right eye: No discharge.        Left eye: No discharge.     Conjunctiva/sclera: Conjunctivae normal.     Pupils: Pupils are equal, round, and reactive to light.  Neck:     Trachea: No tracheal deviation.  Cardiovascular:     Rate and Rhythm: Normal rate and regular rhythm.  Pulmonary:     Effort: Pulmonary effort is normal. No respiratory distress.     Breath sounds: Normal breath sounds. No stridor. No wheezing or rales.  Musculoskeletal: Normal range of motion.  Skin:    General: Skin is warm and dry.     Findings: Rash present.     Comments: Diffusely scattered erythematous papules severely excoriated with many many areas of scabbing - to all extremities Some  excoriations have rim of surrounding erythema 5-10 mm without edema Also some healing scabs and dry peeling areas of skin Small pinpoint spot on right thumb without surrounding erythema or edema  Neurological:     Mental Status: She is alert.     Motor: No abnormal muscle tone.     Coordination: Coordination normal.  Psychiatric:        Attention and Perception: She is inattentive.        Mood and Affect: Mood normal.        Speech: Speech is rapid and pressured.         Behavior: Behavior normal.           Assessment & Plan:      ICD-10-CM   1. Rash and nonspecific skin eruption R21   2. Insect bite, unspecified site, initial encounter W57.XXXA     For mosquito bites - avoid with sprays/citronella etc, take 2 benadryl and pepcid at onset of severe itching, with additional 25 mg benadryl every 4-6 h PRN for itching.    Apply topical OTC antibiotic ointment to excoriated and scabbed areas, no areas with appearance of cellulitis - but with amount of scabs and scratches, concern that possibly some psych issues may be part of her skin issues today?  She notes past epi pen - do not see in chart, and today and per hx given, no need for epi pen with bee sting/wasp.  Did Rx for her per her given hx of anaphylaxis, with thorough review of sx and indications for use as well as needed f/up.  Hand out given regarding anaphylaxis and indications for use of epipen  In passing when leaving exam room today, pt asked for pain medicine.   She never mentioned anything about it during the visit, all that was addressed was her bug bites, excoriations, past hx of needing epipen.  She mentioned that she is seeing dr. Marlou Sa tomorrow for her shoulder pain.   I explained that I would have to look up her past visits, review ortho notes, look her up in the controlled substance database and I would have to do it later in the day before determining whether or not I could Rx any meds.   Since doing so, she has used more than her perscribed amount, 2 norco per day, last prescribed 12/24/2018.  I have some concerns and feel hesitant to continue to prescribe any narcotic pain medications to Ms. Larmon due to the way that she has asked for it at this visit and several past visits.  Her physical exam previously was severly dramatized when she refused to move her right shoulder, but had previously been moving her arms around with nearly normal ROM.  May refer her to pain management due to my  hesitations.  I believe her pain would probably be more safely managed by specialist, or here with NSAIDs? And ultimately by ortho with injections?surgery?     She has already called back today demanding refills and a return call immediately, which also causes me some hesitation.      Delsa Grana, PA-C 01/21/19 10:47 AM

## 2019-01-22 ENCOUNTER — Ambulatory Visit (INDEPENDENT_AMBULATORY_CARE_PROVIDER_SITE_OTHER): Payer: Medicare Other

## 2019-01-22 ENCOUNTER — Encounter: Payer: Self-pay | Admitting: Orthopedic Surgery

## 2019-01-22 ENCOUNTER — Ambulatory Visit (INDEPENDENT_AMBULATORY_CARE_PROVIDER_SITE_OTHER): Payer: Medicare Other | Admitting: Orthopedic Surgery

## 2019-01-22 DIAGNOSIS — M19011 Primary osteoarthritis, right shoulder: Secondary | ICD-10-CM | POA: Diagnosis not present

## 2019-01-22 DIAGNOSIS — I2510 Atherosclerotic heart disease of native coronary artery without angina pectoris: Secondary | ICD-10-CM | POA: Diagnosis not present

## 2019-01-22 DIAGNOSIS — M25511 Pain in right shoulder: Secondary | ICD-10-CM

## 2019-01-22 MED ORDER — HYDROCODONE-ACETAMINOPHEN 5-325 MG PO TABS: ORAL_TABLET | ORAL | 0 refills | Status: DC

## 2019-01-22 NOTE — Telephone Encounter (Signed)
Spoke with pt and she was understanding. She states she is going to ortho today and will get them to fax [place] the notes.

## 2019-01-22 NOTE — Telephone Encounter (Signed)
Thank you for doing those kind of calls, I appreciate you!

## 2019-01-22 NOTE — Progress Notes (Signed)
Office Visit Note   Patient: Deborah Mullins           Date of Birth: September 09, 1950           MRN: [id number] Visit Date: 01/22/2019 Requested by: Delsa Grana, PA-C [address], [place] [address] PCP: Delsa Grana, PA-C  Subjective: Chief Complaint  Patient presents with  . Left Shoulder - Follow-up    HPI: Deborah Mullins is a patient who is now about 6 months out left total shoulder replacement.  She is doing very well with the left total shoulder.  Not having any pain.  She is having some pain in the right shoulder.  She is out of pain medication.  No prior surgery on the right-hand side.  She cannot take tramadol.  She has been taking about 1 Norco at night to help her sleep.              ROS: All systems reviewed are negative as they relate to the chief complaint within the history of present illness.  Patient denies  fevers or chills.   Assessment & Plan: Visit Diagnoses:  1. Right shoulder pain, unspecified chronicity   2. Arthritis of right shoulder region     Plan: Impression is right shoulder arthritis based on examination radiographs today.  Rotator cuff may not be in his greater shape on the right-hand side as it was on the left.  That may potentially put her in the reverse shoulder replacement category as opposed to total shoulder replacement.  Outcomes should be equivalent.  Plan thin cut CT scan right shoulder preop total shoulder replacement versus RSA.  Norco prescribed 1 at night #30.  I did tell her that we cannot really do chronic long-term pain medicine for arthritic type pain but in this case because it is affecting her sleep on a daily basis I wrote her a one-time prescription.  Hopefully by the end of this month we will have a plan for the right shoulder.  Follow-up after CT scan.  Follow-Up Instructions: Return for after MRI.   Orders:  Orders Placed This Encounter  Procedures  . XR Shoulder Right  . CT SHOULDER RIGHT WO CONTRAST   Meds ordered this  encounter  Medications  . HYDROcodone-acetaminophen (NORCO/VICODIN) 5-325 MG tablet    Sig: 1 po q d prn pain    Dispense:  30 tablet    Refill:  0      Procedures: No procedures performed   Clinical Data: No additional findings.  Objective: Vital Signs: There were no vitals taken for this visit.  Physical Exam:   Constitutional: Patient appears well-developed HEENT:  Head: Normocephalic Eyes:EOM are normal Neck: Normal range of motion Cardiovascular: Normal rate Pulmonary/chest: Effort normal Neurologic: Patient is alert Skin: Skin is warm Psychiatric: Patient has normal mood and affect    Ortho Exam: Ortho exam demonstrates active forward flexion and abduction both above 90 degrees on the left-hand side.  Cuff strength is good on the left.  She has diminished range of motion on the right with forward flexion abduction both below 90.  Cuff strength is reasonable but slightly weaker on infraspinatus and supraspinatus testing.  Motor sensory function of the hand is intact.  Radial pulses intact.  No other masses lymphadenopathy or skin changes noted in that shoulder girdle region.  Specialty Comments:  No specialty comments available.  Imaging: Xr Shoulder Right  Result Date: 01/22/2019 [facility] outlet axillary right shoulder reviewed.  Significant bone-on-bone glenohumeral  arthritis is present.  There is also some narrowing of the acromiohumeral distance.  No acute fracture or dislocation.    PMFS History: Patient Active Problem List   Diagnosis Date Noted  . Shoulder arthritis 08/27/2018  . Primary osteoarthritis, left shoulder 04/01/2018  . Primary osteoarthritis, right shoulder 04/01/2018  . Essential hypertension 01/22/2017  . Controlled type 2 diabetes mellitus without complication, without long-term current use of insulin ([facility]) 11/23/2016  . Chronic pain of both shoulders 07/24/2016  . Prediabetes   . Anxiety   . Bipolar 1 disorder ([facility])   . Depression   .  Chronic back pain 11/07/2012  . Insomnia secondary to depression with anxiety 09/23/2012  . Unspecified vitamin D deficiency 09/23/2012  . COPD, mild ([facility]) 07/01/2012  . Acute bronchitis 06/08/2012  . Adenocarcinoma of lung, stage 1 ([facility]) 05/23/2012  . RECTAL BLEEDING 04/27/2010  . HEMORRHOIDS 03/11/2009  . ADENOCARCINOMA, BREAST 12/15/2008  . FIBROIDS, UTERUS 12/15/2008  . HYPERCHOLESTEROLEMIA 12/15/2008  . Hyperlipidemia 12/15/2008  . Anxiety and depression 12/15/2008  . GASTROESOPHAGEAL REFLUX DISEASE 12/15/2008  . GASTRITIS 12/15/2008  . CONSTIPATION 12/15/2008  . FLATULENCE ERUCTATION AND GAS PAIN 12/15/2008  . ABDOMINAL PAIN 12/15/2008  . RECTAL BLEEDING, HX OF 12/15/2008   Past Medical History:  Diagnosis Date  . Allergic rhinitis   . Anxiety   . Arthritis   . Bipolar 1 disorder ([facility])   . Breast cancer ([facility]) 2004  . Carpal tunnel syndrome of left wrist   . Chronic neck pain   . Chronic shoulder pain    L>R  . Depression   . GERD (gastroesophageal reflux disease)   . Headache(784.0)   . History of lung surgery   . Hyperlipidemia   . Hypertension   . Lung cancer ([facility])   . Lung nodule 2009  . Pain in both feet   . Personal history of radiation therapy 2004  . PONV (postoperative nausea and vomiting)   . Type 2 diabetes mellitus ([facility])     Family History  Problem Relation Age of Onset  . Heart disease Mother        CHF  . Physical abuse Mother   . Depression Mother   . Cancer Sister   . ADD / ADHD Sister   . Alcohol abuse Father   . Alcohol abuse Brother   . Mental illness Brother   . Cancer Sister   . Heart disease Sister   . Physical abuse Sister   . OCD Sister   . Mental illness Sister   . Anxiety disorder Neg Hx   . Bipolar disorder Neg Hx   . Dementia Neg Hx   . Drug abuse Neg Hx   . Paranoid behavior Neg Hx   . Schizophrenia Neg Hx   . Seizures Neg Hx   . Sexual abuse Neg Hx     Past Surgical History:  Procedure Laterality Date  . BREAST  SURGERY  2005   lumpectomy - left  . carpel tunnel Bilateral   . CESAREAN SECTION  1971  . COLONOSCOPY WITH PROPOFOL N/A 07/25/2016   Procedure: COLONOSCOPY WITH PROPOFOL;  Surgeon: Danie Binder, MD;  [facility];  Service: Endoscopy;  Laterality: N/A;  8:15 AM  . EYE SURGERY    . LUNG CANCER SURGERY Right 05/23/2012   reportedly clear  . TONSILLECTOMY    . TOTAL SHOULDER ARTHROPLASTY Left 08/27/2018   Procedure: LEFT TOTAL SHOULDER ARTHROPLASTY;  Surgeon: Meredith Pel, MD;  Location: [facility];  Service: Orthopedics;  Laterality: Left;  . TUBAL LIGATION     Social History   Occupational History  . Occupation: retired    Comment: Location manager  Tobacco Use  . Smoking status: Former Smoker    Packs/day: 1.00    Years: 10.00    Pack years: 10.00    Types: Cigarettes    Last attempt to quit: 08/14/1974    Years since quitting: 44.4  . Smokeless tobacco: Never Used  Substance and Sexual Activity  . Alcohol use: Yes    Comment: occasionally   . Drug use: Yes    Types: Marijuana    Comment: smoking daily  . Sexual activity: Never

## 2019-02-06 ENCOUNTER — Other Ambulatory Visit: Payer: Self-pay

## 2019-02-06 ENCOUNTER — Ambulatory Visit
Admission: RE | Admit: 2019-02-06 | Discharge: 2019-02-06 | Disposition: A | Discharge status: Home / Self Care | Payer: Medicare Other | Source: Ambulatory Visit | Attending: Orthopedic Surgery | Admitting: Orthopedic Surgery

## 2019-02-06 DIAGNOSIS — M24011 Loose body in right shoulder: Secondary | ICD-10-CM | POA: Diagnosis not present

## 2019-02-06 DIAGNOSIS — M25511 Pain in right shoulder: Secondary | ICD-10-CM

## 2019-02-06 DIAGNOSIS — M19011 Primary osteoarthritis, right shoulder: Secondary | ICD-10-CM | POA: Diagnosis not present

## 2019-02-06 DIAGNOSIS — Z01818 Encounter for other preprocedural examination: Secondary | ICD-10-CM | POA: Diagnosis not present

## 2019-02-12 ENCOUNTER — Encounter: Payer: Self-pay | Admitting: Orthopedic Surgery

## 2019-02-12 ENCOUNTER — Ambulatory Visit (INDEPENDENT_AMBULATORY_CARE_PROVIDER_SITE_OTHER): Payer: Medicare Other | Admitting: Orthopedic Surgery

## 2019-02-12 ENCOUNTER — Other Ambulatory Visit: Payer: Self-pay

## 2019-02-12 DIAGNOSIS — M19011 Primary osteoarthritis, right shoulder: Secondary | ICD-10-CM

## 2019-02-12 DIAGNOSIS — I2510 Atherosclerotic heart disease of native coronary artery without angina pectoris: Secondary | ICD-10-CM

## 2019-02-12 NOTE — Progress Notes (Signed)
Office Visit Note   Patient: Deborah Mullins           Date of Birth: [birth year]           MRN: [id number] Visit Date: 02/12/2019 Requested by: Delsa Grana, PA-C [address],  [place] [address] PCP: Delsa Grana, PA-C  Subjective: Chief Complaint  Patient presents with  . Follow-up    HPI: Deborah Mullins is a patient with right shoulder pain and arthritis.  Since have seen her she is had CT scan is possible preop planning for shoulder replacement.  She is doing well with her left reverse shoulder replacement.  She is actually doing little bit better with this now.  The pain is not waking her from sleep.  Not really taking much in the way of medication.              ROS: All systems reviewed are negative as they relate to the chief complaint within the history of present illness.  Patient denies  fevers or chills.   Assessment & Plan: Visit Diagnoses:  1. Primary osteoarthritis, right shoulder     Plan: Impression is right shoulder arthritis.  Plan is right shoulder observation for now.  We have the CT scan so if her pain becomes significant or worsens then we can proceed with preop planning and templating.  For now it is in a wall phase.  I think we can watch this and see how she does.  She will call back when she wants to schedule that shoulder replacement.  Follow-Up Instructions: Return if symptoms worsen or fail to improve.   Orders:  No orders of the defined types were placed in this encounter.  No orders of the defined types were placed in this encounter.     Procedures: No procedures performed   Clinical Data: No additional findings.  Objective: Vital Signs: There were no vitals taken for this visit.  Physical Exam:   Constitutional: Patient appears well-developed HEENT:  Head: Normocephalic Eyes:EOM are normal Neck: Normal range of motion Cardiovascular: Normal rate Pulmonary/chest: Effort normal Neurologic: Patient is alert Skin: Skin is warm  Psychiatric: Patient has normal mood and affect    Ortho Exam: Ortho exam demonstrates good range of motion of that left shoulder with abduction and forward flexion both well above 90.  On the right-hand side she has abduction and forward flexion both about 70 degrees.  She is able to get her hand to the top of her ear and that is about it.  External rotation of 15 degrees of abduction is about 20 degrees.  Deltoid is functional.  Specialty Comments:  No specialty comments available.  Imaging: No results found.   PMFS History: Patient Active Problem List   Diagnosis Date Noted  . Shoulder arthritis 08/27/2018  . Primary osteoarthritis, left shoulder 04/01/2018  . Primary osteoarthritis, right shoulder 04/01/2018  . Essential hypertension 01/22/2017  . Controlled type 2 diabetes mellitus without complication, without long-term current use of insulin ([place]) 11/23/2016  . Chronic pain of both shoulders 07/24/2016  . Prediabetes   . Anxiety   . Bipolar 1 disorder ([facility])   . Depression   . Chronic back pain 11/07/2012  . Insomnia secondary to depression with anxiety 09/23/2012  . Unspecified vitamin D deficiency 09/23/2012  . COPD, mild ([facility]) 07/01/2012  . Acute bronchitis 06/08/2012  . Adenocarcinoma of lung, stage 1 ([facility] [facility]) 05/23/2012  . RECTAL BLEEDING 04/27/2010  . HEMORRHOIDS 03/11/2009  . ADENOCARCINOMA, BREAST 12/15/2008  .  FIBROIDS, UTERUS 12/15/2008  . HYPERCHOLESTEROLEMIA 12/15/2008  . Hyperlipidemia 12/15/2008  . Anxiety and depression 12/15/2008  . GASTROESOPHAGEAL REFLUX DISEASE 12/15/2008  . GASTRITIS 12/15/2008  . CONSTIPATION 12/15/2008  . FLATULENCE ERUCTATION AND GAS PAIN 12/15/2008  . ABDOMINAL PAIN 12/15/2008  . RECTAL BLEEDING, HX OF 12/15/2008   Past Medical History:  Diagnosis Date  . Allergic rhinitis   . Anxiety   . Arthritis   . Bipolar 1 disorder ([facility])   . Breast cancer ([facility]) 2004  . Carpal tunnel syndrome of left wrist   . Chronic neck pain    . Chronic shoulder pain    L>R  . Depression   . GERD (gastroesophageal reflux disease)   . Headache(784.0)   . History of lung surgery   . Hyperlipidemia   . Hypertension   . Lung cancer ([facility])   . Lung nodule 2009  . Pain in both feet   . Personal history of radiation therapy 2004  . PONV (postoperative nausea and vomiting)   . Type 2 diabetes mellitus ([facility])     Family History  Problem Relation Age of Onset  . Heart disease Mother        CHF  . Physical abuse Mother   . Depression Mother   . Cancer Sister   . ADD / ADHD Sister   . Alcohol abuse Father   . Alcohol abuse Brother   . Mental illness Brother   . Cancer Sister   . Heart disease Sister   . Physical abuse Sister   . OCD Sister   . Mental illness Sister   . Anxiety disorder Neg Hx   . Bipolar disorder Neg Hx   . Dementia Neg Hx   . Drug abuse Neg Hx   . Paranoid behavior Neg Hx   . Schizophrenia Neg Hx   . Seizures Neg Hx   . Sexual abuse Neg Hx     Past Surgical History:  Procedure Laterality Date  . BREAST SURGERY  2005   lumpectomy - left  . carpel tunnel Bilateral   . CESAREAN SECTION  1971  . COLONOSCOPY WITH PROPOFOL N/A 07/25/2016   Procedure: COLONOSCOPY WITH PROPOFOL;  Surgeon: Danie Binder, MD;  [facility];  Service: Endoscopy;  Laterality: N/A;  8:15 AM  . EYE SURGERY    . LUNG CANCER SURGERY Right 05/23/2012   reportedly clear  . TONSILLECTOMY    . TOTAL SHOULDER ARTHROPLASTY Left 08/27/2018   Procedure: LEFT TOTAL SHOULDER ARTHROPLASTY;  Surgeon: Meredith Pel, MD;  Location: [facility];  Service: Orthopedics;  Laterality: Left;  . TUBAL LIGATION     Social History   Occupational History  . Occupation: retired    Comment: Location manager  Tobacco Use  . Smoking status: Former Smoker    Packs/day: 1.00    Years: 10.00    Pack years: 10.00    Types: Cigarettes    Quit date: 08/14/1974    Years since quitting: 44.5  . Smokeless tobacco: Never Used  Substance  and Sexual Activity  . Alcohol use: Yes    Comment: occasionally   . Drug use: Yes    Types: Marijuana    Comment: smoking daily  . Sexual activity: Never

## 2019-02-18 ENCOUNTER — Telehealth: Payer: Self-pay | Admitting: Family Medicine

## 2019-02-18 NOTE — Telephone Encounter (Signed)
REFILL HYDROCODONE TO [facility]

## 2019-02-19 NOTE — Telephone Encounter (Signed)
Spoke with Kristeen Miss and she states that she will not refill her pain meds. Ortho needs to refill the Rx.

## 2019-02-20 ENCOUNTER — Other Ambulatory Visit: Payer: Medicare Other

## 2019-02-20 ENCOUNTER — Other Ambulatory Visit: Payer: Self-pay

## 2019-02-20 DIAGNOSIS — Z20822 Contact with and (suspected) exposure to covid-19: Secondary | ICD-10-CM

## 2019-02-20 DIAGNOSIS — R6889 Other general symptoms and signs: Secondary | ICD-10-CM | POA: Diagnosis not present

## 2019-02-23 ENCOUNTER — Other Ambulatory Visit: Payer: Self-pay | Admitting: Family Medicine

## 2019-02-23 DIAGNOSIS — G8929 Other chronic pain: Secondary | ICD-10-CM

## 2019-02-24 ENCOUNTER — Telehealth: Payer: Self-pay | Admitting: Orthopedic Surgery

## 2019-02-24 ENCOUNTER — Other Ambulatory Visit: Payer: Self-pay | Admitting: Surgical

## 2019-02-24 LAB — NOVEL CORONAVIRUS, NAA: SARS-CoV-2, NAA: NOT DETECTED

## 2019-02-24 MED ORDER — HYDROCODONE-ACETAMINOPHEN 5-325 MG PO TABS: 1.0000 | ORAL_TABLET | Freq: Every day | ORAL | 0 refills | Status: DC

## 2019-02-24 NOTE — Telephone Encounter (Signed)
Ok for 1 po qd # 30

## 2019-02-24 NOTE — Telephone Encounter (Signed)
pls advise

## 2019-02-24 NOTE — Telephone Encounter (Signed)
Please submit electronically. Thanks.

## 2019-02-24 NOTE — Telephone Encounter (Signed)
Patient called to get a refill for Hydrocodone. In much pain last night.  Please call patien @ [phone]

## 2019-02-25 ENCOUNTER — Encounter: Payer: Self-pay | Admitting: Family Medicine

## 2019-02-28 DIAGNOSIS — J302 Other seasonal allergic rhinitis: Secondary | ICD-10-CM | POA: Diagnosis not present

## 2019-02-28 DIAGNOSIS — K219 Gastro-esophageal reflux disease without esophagitis: Secondary | ICD-10-CM | POA: Diagnosis not present

## 2019-02-28 DIAGNOSIS — F330 Major depressive disorder, recurrent, mild: Secondary | ICD-10-CM | POA: Diagnosis not present

## 2019-02-28 DIAGNOSIS — E1140 Type 2 diabetes mellitus with diabetic neuropathy, unspecified: Secondary | ICD-10-CM | POA: Diagnosis not present

## 2019-02-28 DIAGNOSIS — I10 Essential (primary) hypertension: Secondary | ICD-10-CM | POA: Diagnosis not present

## 2019-02-28 DIAGNOSIS — G9009 Other idiopathic peripheral autonomic neuropathy: Secondary | ICD-10-CM | POA: Diagnosis not present

## 2019-02-28 DIAGNOSIS — M25511 Pain in right shoulder: Secondary | ICD-10-CM | POA: Diagnosis not present

## 2019-02-28 DIAGNOSIS — J449 Chronic obstructive pulmonary disease, unspecified: Secondary | ICD-10-CM | POA: Diagnosis not present

## 2019-02-28 DIAGNOSIS — E785 Hyperlipidemia, unspecified: Secondary | ICD-10-CM | POA: Diagnosis not present

## 2019-03-15 DIAGNOSIS — R600 Localized edema: Secondary | ICD-10-CM | POA: Diagnosis not present

## 2019-03-15 DIAGNOSIS — T63441A Toxic effect of venom of bees, accidental (unintentional), initial encounter: Secondary | ICD-10-CM | POA: Diagnosis not present

## 2019-03-27 DIAGNOSIS — E559 Vitamin D deficiency, unspecified: Secondary | ICD-10-CM | POA: Diagnosis not present

## 2019-03-27 DIAGNOSIS — I10 Essential (primary) hypertension: Secondary | ICD-10-CM | POA: Diagnosis not present

## 2019-03-27 DIAGNOSIS — Z1329 Encounter for screening for other suspected endocrine disorder: Secondary | ICD-10-CM | POA: Diagnosis not present

## 2019-03-27 DIAGNOSIS — E1140 Type 2 diabetes mellitus with diabetic neuropathy, unspecified: Secondary | ICD-10-CM | POA: Diagnosis not present

## 2019-03-27 DIAGNOSIS — E785 Hyperlipidemia, unspecified: Secondary | ICD-10-CM | POA: Diagnosis not present

## 2019-03-31 DIAGNOSIS — N183 Chronic kidney disease, stage 3 (moderate): Secondary | ICD-10-CM | POA: Diagnosis not present

## 2019-03-31 DIAGNOSIS — G9009 Other idiopathic peripheral autonomic neuropathy: Secondary | ICD-10-CM | POA: Diagnosis not present

## 2019-03-31 DIAGNOSIS — E1140 Type 2 diabetes mellitus with diabetic neuropathy, unspecified: Secondary | ICD-10-CM | POA: Diagnosis not present

## 2019-03-31 DIAGNOSIS — F330 Major depressive disorder, recurrent, mild: Secondary | ICD-10-CM | POA: Diagnosis not present

## 2019-03-31 DIAGNOSIS — M25511 Pain in right shoulder: Secondary | ICD-10-CM | POA: Diagnosis not present

## 2019-03-31 DIAGNOSIS — E785 Hyperlipidemia, unspecified: Secondary | ICD-10-CM | POA: Diagnosis not present

## 2019-03-31 DIAGNOSIS — L989 Disorder of the skin and subcutaneous tissue, unspecified: Secondary | ICD-10-CM | POA: Diagnosis not present

## 2019-03-31 DIAGNOSIS — J302 Other seasonal allergic rhinitis: Secondary | ICD-10-CM | POA: Diagnosis not present

## 2019-03-31 DIAGNOSIS — K5900 Constipation, unspecified: Secondary | ICD-10-CM | POA: Diagnosis not present

## 2019-03-31 DIAGNOSIS — I10 Essential (primary) hypertension: Secondary | ICD-10-CM | POA: Diagnosis not present

## 2019-03-31 DIAGNOSIS — J449 Chronic obstructive pulmonary disease, unspecified: Secondary | ICD-10-CM | POA: Diagnosis not present

## 2019-03-31 DIAGNOSIS — K219 Gastro-esophageal reflux disease without esophagitis: Secondary | ICD-10-CM | POA: Diagnosis not present

## 2019-04-07 NOTE — Pre-Procedure Instructions (Addendum)
[id number] [place], [place] [address] [place]  [address] Phone: [phone] Fax: [place], [name] [address] Phone: [phone] Fax: [place], [place] [address] AT [place]. [place] [address] Phone: [phone] Fax: [phone]      Your procedure is scheduled on Tuesday, September 1st..  Report to [facility] Main Entrance "A" at 9:20 A.M., and check in at the Admitting office.  Call this number if you have problems the morning of surgery:  [phone]  Call [phone] if you have any questions prior to your surgery date Monday-Friday 8am-4pm    Remember:  Do not eat or midnight the night before your surgery  You may drink clear liquids until 8:20 A.M. the morning of your surgery.   Clear liquids allowed are: Water, Non-Citrus Juices (without pulp), Carbonated Beverages, Clear Tea, Black Coffee Only, and Gatorade.   Enhanced Recovery after Surgery for Orthopedics Enhanced Recovery after Surgery is a protocol used to improve the stress on your body and your recovery after surgery.  Patient Instructions  . The night before surgery:  o No food after midnight. ONLY clear liquids after midnight.  . The day of surgery (if you have diabetes): o  o Drink ONE (1) Gatorade 2 (G2) as directed. o This drink was given to you during your hospital  pre-op appointment visit.  o The pre-op nurse will instruct you on the time to drink the   Gatorade 2 (G2) depending on your surgery time. o Color of the Gatorade may vary. Red is not allowed. o Nothing else to drink after completing the  Gatorade 2 (G2).         If you have questions, please contact your surgeon's office.     Take these medicines the morning of surgery  with A SIP OF WATER  buPROPion (WELLBUTRIN SR)  cetirizine (ZYRTEC) FLUoxetine (PROZAC) fluticasone (FLONASE) gabapentin (NEURONTIN) HYDROcodone-acetaminophen (NORCO/VICODIN)  methocarbamol (ROBAXIN)  pantoprazole (PROTONIX)  rosuvastatin (CRESTOR)  albuterol (PROVENTIL HFA;VENTOLIN HFA)-if needed for shortness of breath  Follow your surgeon's instructions on when to stop Aspirin.  If no instructions were given by your surgeon then you will need to call the office to get those instructions.    As of today, STOP taking any Aleve, Naproxen, Ibuprofen, Motrin, Advil, Goody's, BC's, all herbal medications, fish oil, and all vitamins.   WHAT DO I DO ABOUT MY DIABETES MEDICATION?   Marland Kitchen Do not take oral diabetes medicines (pills) the morning of surgery: metFORMIN (GLUCOPHAGE).    How to Manage Your Diabetes Before and After Surgery  Why is it important to control my blood sugar before and after surgery? . Improving blood sugar levels before and after surgery helps healing and can limit problems. . A way of improving blood sugar control is eating a healthy diet by: o  Eating less sugar and carbohydrates o  Increasing activity/exercise o  Talking with your doctor about reaching your blood sugar goals . High blood sugars (greater than 180 mg/dL) can raise your risk of infections and slow your recovery, so you will need to focus on controlling your diabetes during the weeks before surgery. . Make sure that the doctor who takes care of your diabetes knows about your planned surgery  including the date and location.  How do I manage my blood sugar before surgery? . Check your blood sugar at least 4 times a day, starting 2 days before surgery, to make sure that the level is not too high or low. o Check your blood sugar the morning of your surgery when you wake up and every 2 hours until you get to the Short Stay unit. . If your blood sugar is less than 70 mg/dL, you will need to treat for low  blood sugar: o Do not take insulin. o Treat a low blood sugar (less than 70 mg/dL) with  cup of clear juice (cranberry or apple), 4 glucose tablets, OR glucose gel. o Recheck blood sugar in 15 minutes after treatment (to make sure it is greater than 70 mg/dL). If your blood sugar is not greater than 70 mg/dL on recheck, call [phone] for further instructions. . Report your blood sugar to the short stay nurse when you get to Short Stay.  . If you are admitted to the hospital after surgery: o Your blood sugar will be checked by the staff and you will probably be given insulin after surgery (instead of oral diabetes medicines) to make sure you have good blood sugar levels. o The goal for blood sugar control after surgery is 80-180 mg/dL.    The Morning of Surgery  Do not wear jewelry, make-up or nail polish.  Do not wear lotions, powders, or perfumes/colognes, or deodorant  Do not shave 48 hours prior to surgery.   Do not bring valuables to the hospital.   [facility] is not responsible for any belongings or valuables.  If you are a smoker, DO NOT Smoke 24 hours prior to surgery IF you wear a CPAP at night please bring your mask, tubing, and machine the morning of surgery   Remember that you must have someone to transport you home after your surgery, and remain with you for 24 hours if you are discharged the same day.   Contacts, glasses, hearing aids, dentures or bridgework may not be worn into surgery.    Leave your suitcase in the car.  After surgery it may be brought to your room.  For patients admitted to the hospital, discharge time will be determined by your treatment team.  Patients discharged the day of surgery will not be allowed to drive home.    Special instructions:   [facility]- Preparing For Surgery  Before surgery, you can play an important role. Because skin is not sterile, your skin needs to be as free of germs as possible. You can reduce the number of germs  on your skin by washing with CHG (chlorahexidine gluconate) Soap before surgery.  CHG is an antiseptic cleaner which kills germs and bonds with the skin to continue killing germs even after washing.    Oral Hygiene is also important to reduce your risk of infection.  Remember - BRUSH YOUR TEETH THE MORNING OF SURGERY WITH YOUR REGULAR TOOTHPASTE  Please do not use if you have an allergy to CHG or antibacterial soaps. If your skin becomes reddened/irritated stop using the CHG.  Do not shave (including legs and underarms) for at least 48 hours prior to first CHG shower. It is OK to shave your face.  Please follow these instructions carefully.   1. Shower the NIGHT BEFORE SURGERY and the MORNING OF SURGERY with CHG Soap.   2. If you chose to wash your hair, wash your hair first as usual  with your normal shampoo.  3. After you shampoo, rinse your hair and body thoroughly to remove the shampoo.  4. Use CHG as you would any other liquid soap. You can apply CHG directly to the skin and wash gently with a scrungie or a clean washcloth.   5. Apply the CHG Soap to your body ONLY FROM THE NECK DOWN.  Do not use on open wounds or open sores. Avoid contact with your eyes, ears, mouth and genitals (private parts). Wash Face and genitals (private parts)  with your normal soap.   6. Wash thoroughly, paying special attention to the area where your surgery will be performed.  7. Thoroughly rinse your body with warm water from the neck down.  8. DO NOT shower/wash with your normal soap after using and rinsing off the CHG Soap.  9. Pat yourself dry with a CLEAN TOWEL.  10. Wear CLEAN PAJAMAS to bed the night before surgery, wear comfortable clothes the morning of surgery  11. Place CLEAN SHEETS on your bed the night of your first shower and DO NOT SLEEP WITH PETS.    Day of Surgery:  Do not apply any deodorants/lotions. Please shower the morning of surgery with the CHG soap  Please wear clean  clothes to the hospital/surgery center.   Remember to brush your teeth WITH YOUR REGULAR TOOTHPASTE.   Please read over the following fact sheets that you were given.

## 2019-04-08 ENCOUNTER — Encounter (HOSPITAL_COMMUNITY)
Admission: RE | Admit: 2019-04-08 | Discharge: 2019-04-08 | Disposition: A | Discharge status: Home / Self Care | Payer: Medicare Other | Source: Ambulatory Visit | Attending: Orthopedic Surgery | Admitting: Orthopedic Surgery

## 2019-04-08 ENCOUNTER — Encounter (HOSPITAL_COMMUNITY): Payer: Self-pay

## 2019-04-08 ENCOUNTER — Other Ambulatory Visit: Payer: Self-pay

## 2019-04-08 DIAGNOSIS — J449 Chronic obstructive pulmonary disease, unspecified: Secondary | ICD-10-CM | POA: Diagnosis not present

## 2019-04-08 DIAGNOSIS — E785 Hyperlipidemia, unspecified: Secondary | ICD-10-CM | POA: Insufficient documentation

## 2019-04-08 DIAGNOSIS — Z20828 Contact with and (suspected) exposure to other viral communicable diseases: Secondary | ICD-10-CM | POA: Diagnosis not present

## 2019-04-08 DIAGNOSIS — M19011 Primary osteoarthritis, right shoulder: Secondary | ICD-10-CM | POA: Diagnosis not present

## 2019-04-08 DIAGNOSIS — I10 Essential (primary) hypertension: Secondary | ICD-10-CM | POA: Diagnosis not present

## 2019-04-08 DIAGNOSIS — Z87891 Personal history of nicotine dependence: Secondary | ICD-10-CM | POA: Diagnosis not present

## 2019-04-08 DIAGNOSIS — F319 Bipolar disorder, unspecified: Secondary | ICD-10-CM | POA: Diagnosis not present

## 2019-04-08 DIAGNOSIS — E119 Type 2 diabetes mellitus without complications: Secondary | ICD-10-CM | POA: Insufficient documentation

## 2019-04-08 DIAGNOSIS — Z79899 Other long term (current) drug therapy: Secondary | ICD-10-CM | POA: Insufficient documentation

## 2019-04-08 DIAGNOSIS — Z01818 Encounter for other preprocedural examination: Secondary | ICD-10-CM | POA: Diagnosis not present

## 2019-04-08 LAB — CBC
HCT: 39.9 % (ref 36.0–46.0)
Hemoglobin: 12.2 g/dL (ref 12.0–15.0)
MCH: 29.5 pg (ref 26.0–34.0)
MCHC: 30.6 g/dL (ref 30.0–36.0)
MCV: 96.6 fL (ref 80.0–100.0)
Platelets: 232 10*3/uL (ref 150–400)
RBC: 4.13 MIL/uL (ref 3.87–5.11)
RDW: 13.7 % (ref 11.5–15.5)
WBC: 5.4 10*3/uL (ref 4.0–10.5)
nRBC: 0 % (ref 0.0–0.2)

## 2019-04-08 LAB — URINALYSIS, ROUTINE W REFLEX MICROSCOPIC
Bacteria, UA: NONE SEEN
Bilirubin Urine: NEGATIVE
Glucose, UA: NEGATIVE mg/dL
Hgb urine dipstick: NEGATIVE
Ketones, ur: NEGATIVE mg/dL
Leukocytes,Ua: NEGATIVE
Nitrite: NEGATIVE
Protein, ur: 30 mg/dL — AB
Specific Gravity, Urine: 1.024 (ref 1.005–1.030)
pH: 5 (ref 5.0–8.0)

## 2019-04-08 LAB — BASIC METABOLIC PANEL
Anion gap: 8 (ref 5–15)
BUN: 13 mg/dL (ref 8–23)
CO2: 28 mmol/L (ref 22–32)
Calcium: 9.7 mg/dL (ref 8.9–10.3)
Chloride: 106 mmol/L (ref 98–111)
Creatinine, Ser: 1.07 mg/dL — ABNORMAL HIGH (ref 0.44–1.00)
GFR calc Af Amer: >60 mL/min (ref >60)
GFR calc non Af Amer: 54 mL/min — ABNORMAL LOW (ref >60)
Glucose, Bld: 86 mg/dL (ref 70–99)
Potassium: 4.1 mmol/L (ref 3.5–5.1)
Sodium: 142 mmol/L (ref 135–145)

## 2019-04-08 LAB — SURGICAL PCR SCREEN
MRSA, PCR: NEGATIVE
Staphylococcus aureus: NEGATIVE

## 2019-04-08 LAB — GLUCOSE, CAPILLARY: Glucose-Capillary: 118 mg/dL — ABNORMAL HIGH (ref 70–99)

## 2019-04-08 NOTE — Progress Notes (Signed)
PCP - Dr. Legrand Como Matthews/ Dr. Dennard Schaumann Cardiologist - denies  Chest x-ray - N/A EKG - 04/08/19 Stress Test - 07/18/18 ECHO - denies Cardiac Cath - denies  Sleep Study - denies CPAP - denies  Fasting Blood Sugar - denies checking.  Checks Blood Sugar 2-3 times a week.   Blood Thinner Instructions:N/A Aspirin Instructions:Will stop today for surgery.   Anesthesia review: No  Patient denies shortness of breath, fever, cough and chest pain at PAT appointment   Patient verbalized understanding of instructions that were given to them at the PAT appointment. Patient was also instructed that they will need to review over the PAT instructions again at home before surgery.   Coronavirus Screening  Have you experienced the following symptoms:  Cough yes/no: No Fever (>100.15F)  yes/no: No Runny nose yes/no: No Sore throat yes/no: No Difficulty breathing/shortness of breath  yes/no: No  Have you or a family member traveled in the last 14 days and where? yes/no: No   If the patient indicates "YES" to the above questions, their PAT will be rescheduled to limit the exposure to others and, the surgeon will be notified. THE PATIENT WILL NEED TO BE ASYMPTOMATIC FOR 14 DAYS.   If the patient is not experiencing any of these symptoms, the PAT nurse will instruct them to NOT bring anyone with them to their appointment since they may have these symptoms or traveled as well.   Please remind your patients and families that hospital visitation restrictions are in effect and the importance of the restrictions.

## 2019-04-09 LAB — URINE CULTURE

## 2019-04-11 ENCOUNTER — Other Ambulatory Visit: Payer: Self-pay

## 2019-04-11 ENCOUNTER — Other Ambulatory Visit (HOSPITAL_COMMUNITY)
Admission: RE | Admit: 2019-04-11 | Discharge: 2019-04-11 | Disposition: A | Discharge status: Home / Self Care | Payer: Medicare Other | Source: Ambulatory Visit | Attending: Orthopedic Surgery | Admitting: Orthopedic Surgery

## 2019-04-11 ENCOUNTER — Other Ambulatory Visit (HOSPITAL_COMMUNITY): Payer: Medicare Other

## 2019-04-11 DIAGNOSIS — M19011 Primary osteoarthritis, right shoulder: Secondary | ICD-10-CM | POA: Diagnosis not present

## 2019-04-11 DIAGNOSIS — Z01818 Encounter for other preprocedural examination: Secondary | ICD-10-CM | POA: Diagnosis not present

## 2019-04-11 DIAGNOSIS — E785 Hyperlipidemia, unspecified: Secondary | ICD-10-CM | POA: Diagnosis not present

## 2019-04-11 DIAGNOSIS — E119 Type 2 diabetes mellitus without complications: Secondary | ICD-10-CM | POA: Diagnosis not present

## 2019-04-11 DIAGNOSIS — Z20828 Contact with and (suspected) exposure to other viral communicable diseases: Secondary | ICD-10-CM | POA: Diagnosis not present

## 2019-04-11 DIAGNOSIS — Z20822 Contact with and (suspected) exposure to covid-19: Secondary | ICD-10-CM

## 2019-04-11 DIAGNOSIS — J449 Chronic obstructive pulmonary disease, unspecified: Secondary | ICD-10-CM | POA: Diagnosis not present

## 2019-04-11 LAB — SARS CORONAVIRUS 2 (TAT 6-24 HRS): SARS Coronavirus 2: NEGATIVE

## 2019-04-11 NOTE — Progress Notes (Signed)
F/U cabinet: Urine culture results routed to Dr. Marlou Sa.

## 2019-04-15 ENCOUNTER — Encounter (HOSPITAL_COMMUNITY): Payer: Self-pay | Admitting: Anesthesiology

## 2019-04-15 ENCOUNTER — Other Ambulatory Visit: Payer: Self-pay

## 2019-04-15 ENCOUNTER — Inpatient Hospital Stay (HOSPITAL_COMMUNITY): Payer: Medicare Other | Admitting: Certified Registered Nurse Anesthetist

## 2019-04-15 ENCOUNTER — Inpatient Hospital Stay (HOSPITAL_COMMUNITY): Payer: Medicare Other

## 2019-04-15 ENCOUNTER — Encounter (HOSPITAL_COMMUNITY)
Admission: RE | Disposition: A | Discharge status: Home / Self Care | Payer: Self-pay | Source: Ambulatory Visit | Attending: Orthopedic Surgery

## 2019-04-15 ENCOUNTER — Inpatient Hospital Stay (HOSPITAL_COMMUNITY)
Admission: RE | Admit: 2019-04-15 | Discharge: 2019-04-16 | DRG: 483 | Disposition: A | Discharge status: Home / Self Care | Payer: Medicare Other | Source: Ambulatory Visit | Attending: Orthopedic Surgery | Admitting: Orthopedic Surgery

## 2019-04-15 DIAGNOSIS — G8929 Other chronic pain: Secondary | ICD-10-CM | POA: Diagnosis present

## 2019-04-15 DIAGNOSIS — Z853 Personal history of malignant neoplasm of breast: Secondary | ICD-10-CM

## 2019-04-15 DIAGNOSIS — Z96612 Presence of left artificial shoulder joint: Secondary | ICD-10-CM | POA: Diagnosis present

## 2019-04-15 DIAGNOSIS — Z7951 Long term (current) use of inhaled steroids: Secondary | ICD-10-CM

## 2019-04-15 DIAGNOSIS — F418 Other specified anxiety disorders: Secondary | ICD-10-CM | POA: Diagnosis not present

## 2019-04-15 DIAGNOSIS — K219 Gastro-esophageal reflux disease without esophagitis: Secondary | ICD-10-CM | POA: Diagnosis present

## 2019-04-15 DIAGNOSIS — Z8249 Family history of ischemic heart disease and other diseases of the circulatory system: Secondary | ICD-10-CM | POA: Diagnosis not present

## 2019-04-15 DIAGNOSIS — Z91030 Bee allergy status: Secondary | ICD-10-CM | POA: Diagnosis not present

## 2019-04-15 DIAGNOSIS — Z7982 Long term (current) use of aspirin: Secondary | ICD-10-CM

## 2019-04-15 DIAGNOSIS — Z811 Family history of alcohol abuse and dependence: Secondary | ICD-10-CM

## 2019-04-15 DIAGNOSIS — E119 Type 2 diabetes mellitus without complications: Secondary | ICD-10-CM | POA: Diagnosis present

## 2019-04-15 DIAGNOSIS — E785 Hyperlipidemia, unspecified: Secondary | ICD-10-CM | POA: Diagnosis present

## 2019-04-15 DIAGNOSIS — Z471 Aftercare following joint replacement surgery: Secondary | ICD-10-CM | POA: Diagnosis not present

## 2019-04-15 DIAGNOSIS — Z923 Personal history of irradiation: Secondary | ICD-10-CM | POA: Diagnosis not present

## 2019-04-15 DIAGNOSIS — Z85118 Personal history of other malignant neoplasm of bronchus and lung: Secondary | ICD-10-CM | POA: Diagnosis not present

## 2019-04-15 DIAGNOSIS — G8918 Other acute postprocedural pain: Secondary | ICD-10-CM | POA: Diagnosis not present

## 2019-04-15 DIAGNOSIS — F17210 Nicotine dependence, cigarettes, uncomplicated: Secondary | ICD-10-CM | POA: Diagnosis present

## 2019-04-15 DIAGNOSIS — Z880 Allergy status to penicillin: Secondary | ICD-10-CM | POA: Diagnosis not present

## 2019-04-15 DIAGNOSIS — Z96611 Presence of right artificial shoulder joint: Secondary | ICD-10-CM | POA: Diagnosis not present

## 2019-04-15 DIAGNOSIS — I10 Essential (primary) hypertension: Secondary | ICD-10-CM | POA: Diagnosis present

## 2019-04-15 DIAGNOSIS — M19019 Primary osteoarthritis, unspecified shoulder: Secondary | ICD-10-CM | POA: Diagnosis present

## 2019-04-15 DIAGNOSIS — F319 Bipolar disorder, unspecified: Secondary | ICD-10-CM | POA: Diagnosis present

## 2019-04-15 DIAGNOSIS — Z7984 Long term (current) use of oral hypoglycemic drugs: Secondary | ICD-10-CM | POA: Diagnosis not present

## 2019-04-15 DIAGNOSIS — M19011 Primary osteoarthritis, right shoulder: Secondary | ICD-10-CM | POA: Diagnosis present

## 2019-04-15 DIAGNOSIS — Z818 Family history of other mental and behavioral disorders: Secondary | ICD-10-CM

## 2019-04-15 DIAGNOSIS — M542 Cervicalgia: Secondary | ICD-10-CM | POA: Diagnosis present

## 2019-04-15 DIAGNOSIS — Z9889 Other specified postprocedural states: Secondary | ICD-10-CM

## 2019-04-15 DIAGNOSIS — Z809 Family history of malignant neoplasm, unspecified: Secondary | ICD-10-CM

## 2019-04-15 HISTORY — PX: TOTAL SHOULDER ARTHROPLASTY: SHX126

## 2019-04-15 HISTORY — DX: Primary osteoarthritis, right shoulder: M19.011

## 2019-04-15 LAB — GLUCOSE, CAPILLARY
Glucose-Capillary: 75 mg/dL (ref 70–99)
Glucose-Capillary: 84 mg/dL (ref 70–99)
Glucose-Capillary: 108 mg/dL — ABNORMAL HIGH (ref 70–99)

## 2019-04-15 SURGERY — ARTHROPLASTY, SHOULDER, TOTAL
Anesthesia: General | Laterality: Right

## 2019-04-15 MED ORDER — ROCURONIUM BROMIDE 10 MG/ML (PF) SYRINGE
PREFILLED_SYRINGE | INTRAVENOUS | Status: AC
  Filled 2019-04-15: qty 10

## 2019-04-15 MED ORDER — FENTANYL CITRATE (PF) 100 MCG/2ML IJ SOLN
50.0000 ug | Freq: Once | INTRAMUSCULAR | Status: AC
  Administered 2019-04-15: 11:00:00 50 ug via INTRAVENOUS

## 2019-04-15 MED ORDER — DIPHENHYDRAMINE HCL 50 MG/ML IJ SOLN
INTRAMUSCULAR | Status: DC | PRN
  Administered 2019-04-15: 12.5 mg via INTRAVENOUS

## 2019-04-15 MED ORDER — DOCUSATE SODIUM 100 MG PO CAPS
100.0000 mg | ORAL_CAPSULE | Freq: Two times a day (BID) | ORAL | Status: DC
  Administered 2019-04-15 – 2019-04-16 (×2): 100 mg via ORAL
  Filled 2019-04-15 (×2): qty 1

## 2019-04-15 MED ORDER — FENTANYL CITRATE (PF) 250 MCG/5ML IJ SOLN
INTRAMUSCULAR | Status: AC
  Filled 2019-04-15: qty 5

## 2019-04-15 MED ORDER — LACTATED RINGERS IV SOLN
INTRAVENOUS | Status: AC
  Administered 2019-04-15: 17:00:00 via INTRAVENOUS

## 2019-04-15 MED ORDER — SUGAMMADEX SODIUM 200 MG/2ML IV SOLN
INTRAVENOUS | Status: DC | PRN
  Administered 2019-04-15: 135.6 mg via INTRAVENOUS

## 2019-04-15 MED ORDER — PHENYLEPHRINE HCL (PRESSORS) 10 MG/ML IV SOLN
INTRAVENOUS | Status: DC | PRN
  Administered 2019-04-15: 120 ug via INTRAVENOUS

## 2019-04-15 MED ORDER — DEXAMETHASONE SODIUM PHOSPHATE 10 MG/ML IJ SOLN
INTRAMUSCULAR | Status: AC
  Filled 2019-04-15: qty 1

## 2019-04-15 MED ORDER — HYDROMORPHONE HCL 1 MG/ML IJ SOLN: 0.5000 mg | INTRAMUSCULAR | Status: DC | PRN

## 2019-04-15 MED ORDER — TRANEXAMIC ACID-NACL 1000-0.7 MG/100ML-% IV SOLN
1000.0000 mg | INTRAVENOUS | Status: AC
  Administered 2019-04-15: 12:00:00 1000 mg via INTRAVENOUS

## 2019-04-15 MED ORDER — TRANEXAMIC ACID-NACL 1000-0.7 MG/100ML-% IV SOLN
INTRAVENOUS | Status: AC
  Filled 2019-04-15: qty 100

## 2019-04-15 MED ORDER — LIDOCAINE 2% (20 MG/ML) 5 ML SYRINGE
INTRAMUSCULAR | Status: AC
  Filled 2019-04-15: qty 5

## 2019-04-15 MED ORDER — ROCURONIUM BROMIDE 50 MG/5ML IV SOSY
PREFILLED_SYRINGE | INTRAVENOUS | Status: DC | PRN
  Administered 2019-04-15: 100 mg via INTRAVENOUS

## 2019-04-15 MED ORDER — VANCOMYCIN HCL IN DEXTROSE 1-5 GM/200ML-% IV SOLN
1000.0000 mg | INTRAVENOUS | Status: AC
  Administered 2019-04-15: 10:00:00 1000 mg via INTRAVENOUS

## 2019-04-15 MED ORDER — METHOCARBAMOL 1000 MG/10ML IJ SOLN
500.0000 mg | Freq: Four times a day (QID) | INTRAVENOUS | Status: DC | PRN
  Filled 2019-04-15: qty 5

## 2019-04-15 MED ORDER — DIPHENHYDRAMINE HCL 50 MG/ML IJ SOLN
INTRAMUSCULAR | Status: AC
  Filled 2019-04-15: qty 1

## 2019-04-15 MED ORDER — LACTATED RINGERS IV SOLN
INTRAVENOUS | Status: DC
  Administered 2019-04-15 (×2): via INTRAVENOUS

## 2019-04-15 MED ORDER — ACETAMINOPHEN 325 MG PO TABS: 325.0000 mg | ORAL_TABLET | Freq: Once | ORAL | Status: DC | PRN

## 2019-04-15 MED ORDER — CEFAZOLIN SODIUM 1 G IJ SOLR
INTRAMUSCULAR | Status: AC
  Filled 2019-04-15: qty 20

## 2019-04-15 MED ORDER — CHLORHEXIDINE GLUCONATE 4 % EX LIQD: 60.0000 mL | Freq: Once | CUTANEOUS | Status: DC

## 2019-04-15 MED ORDER — METFORMIN HCL 500 MG PO TABS
500.0000 mg | ORAL_TABLET | Freq: Two times a day (BID) | ORAL | Status: DC
  Administered 2019-04-15 – 2019-04-16 (×2): 500 mg via ORAL
  Filled 2019-04-15 (×2): qty 1

## 2019-04-15 MED ORDER — METOCLOPRAMIDE HCL 5 MG/ML IJ SOLN: 5.0000 mg | Freq: Three times a day (TID) | INTRAMUSCULAR | Status: DC | PRN

## 2019-04-15 MED ORDER — MIDAZOLAM HCL 2 MG/2ML IJ SOLN
2.0000 mg | Freq: Once | INTRAMUSCULAR | Status: AC
  Administered 2019-04-15: 11:00:00 2 mg via INTRAVENOUS

## 2019-04-15 MED ORDER — BUPIVACAINE HCL (PF) 0.5 % IJ SOLN
INTRAMUSCULAR | Status: DC | PRN
  Administered 2019-04-15: 15 mL

## 2019-04-15 MED ORDER — VANCOMYCIN HCL 1000 MG IV SOLR
INTRAVENOUS | Status: AC
  Filled 2019-04-15: qty 1000

## 2019-04-15 MED ORDER — PHENOL 1.4 % MT LIQD: 1.0000 | OROMUCOSAL | Status: DC | PRN

## 2019-04-15 MED ORDER — MENTHOL 3 MG MT LOZG: 1.0000 | LOZENGE | OROMUCOSAL | Status: DC | PRN

## 2019-04-15 MED ORDER — 0.9 % SODIUM CHLORIDE (POUR BTL) OPTIME
TOPICAL | Status: DC | PRN
  Administered 2019-04-15 (×5): 1000 mL

## 2019-04-15 MED ORDER — PROPOFOL 10 MG/ML IV BOLUS
INTRAVENOUS | Status: DC | PRN
  Administered 2019-04-15: 100 mg via INTRAVENOUS

## 2019-04-15 MED ORDER — PHENYLEPHRINE 40 MCG/ML (10ML) SYRINGE FOR IV PUSH (FOR BLOOD PRESSURE SUPPORT)
PREFILLED_SYRINGE | INTRAVENOUS | Status: AC
  Filled 2019-04-15: qty 10

## 2019-04-15 MED ORDER — FENTANYL CITRATE (PF) 100 MCG/2ML IJ SOLN
INTRAMUSCULAR | Status: AC
  Administered 2019-04-15: 50 ug via INTRAVENOUS
  Filled 2019-04-15: qty 2

## 2019-04-15 MED ORDER — ONDANSETRON HCL 4 MG/2ML IJ SOLN
INTRAMUSCULAR | Status: AC
  Filled 2019-04-15: qty 2

## 2019-04-15 MED ORDER — PROMETHAZINE HCL 25 MG/ML IJ SOLN: 6.2500 mg | INTRAMUSCULAR | Status: DC | PRN

## 2019-04-15 MED ORDER — LACTATED RINGERS IV SOLN: INTRAVENOUS | Status: DC

## 2019-04-15 MED ORDER — MIDAZOLAM HCL 2 MG/2ML IJ SOLN
INTRAMUSCULAR | Status: AC
  Filled 2019-04-15: qty 2

## 2019-04-15 MED ORDER — ONDANSETRON HCL 4 MG/2ML IJ SOLN
INTRAMUSCULAR | Status: DC | PRN
  Administered 2019-04-15: 4 mg via INTRAVENOUS

## 2019-04-15 MED ORDER — ONDANSETRON HCL 4 MG/2ML IJ SOLN: 4.0000 mg | Freq: Four times a day (QID) | INTRAMUSCULAR | Status: DC | PRN

## 2019-04-15 MED ORDER — MIDAZOLAM HCL 2 MG/2ML IJ SOLN
INTRAMUSCULAR | Status: AC
  Administered 2019-04-15: 2 mg via INTRAVENOUS
  Filled 2019-04-15: qty 2

## 2019-04-15 MED ORDER — VANCOMYCIN HCL 1000 MG IV SOLR
INTRAVENOUS | Status: DC | PRN
  Administered 2019-04-15: 1000 mg via TOPICAL

## 2019-04-15 MED ORDER — VANCOMYCIN HCL IN DEXTROSE 1-5 GM/200ML-% IV SOLN
1000.0000 mg | Freq: Two times a day (BID) | INTRAVENOUS | Status: AC
  Administered 2019-04-15: 1000 mg via INTRAVENOUS
  Filled 2019-04-15: qty 200

## 2019-04-15 MED ORDER — FENTANYL CITRATE (PF) 250 MCG/5ML IJ SOLN
INTRAMUSCULAR | Status: DC | PRN
  Administered 2019-04-15: 50 ug via INTRAVENOUS

## 2019-04-15 MED ORDER — MEPERIDINE HCL 25 MG/ML IJ SOLN: 6.2500 mg | INTRAMUSCULAR | Status: DC | PRN

## 2019-04-15 MED ORDER — LIDOCAINE 2% (20 MG/ML) 5 ML SYRINGE
INTRAMUSCULAR | Status: DC | PRN
  Administered 2019-04-15: 40 mg via INTRAVENOUS

## 2019-04-15 MED ORDER — ACETAMINOPHEN 160 MG/5ML PO SOLN: 325.0000 mg | Freq: Once | ORAL | Status: DC | PRN

## 2019-04-15 MED ORDER — METHOCARBAMOL 500 MG PO TABS
500.0000 mg | ORAL_TABLET | Freq: Four times a day (QID) | ORAL | Status: DC | PRN
  Administered 2019-04-15 – 2019-04-16 (×2): 500 mg via ORAL
  Filled 2019-04-15 (×2): qty 1

## 2019-04-15 MED ORDER — HYDROMORPHONE HCL 1 MG/ML IJ SOLN: 0.2500 mg | INTRAMUSCULAR | Status: DC | PRN

## 2019-04-15 MED ORDER — CEFAZOLIN SODIUM-DEXTROSE 2-3 GM-%(50ML) IV SOLR
INTRAVENOUS | Status: DC | PRN
  Administered 2019-04-15: 2 g via INTRAVENOUS

## 2019-04-15 MED ORDER — SODIUM CHLORIDE 0.9 % IV SOLN
INTRAVENOUS | Status: DC | PRN
  Administered 2019-04-15: 50 ug/min via INTRAVENOUS

## 2019-04-15 MED ORDER — NAPROXEN 250 MG PO TABS
250.0000 mg | ORAL_TABLET | Freq: Two times a day (BID) | ORAL | Status: DC
  Administered 2019-04-15: 18:00:00 250 mg via ORAL
  Filled 2019-04-15 (×3): qty 1

## 2019-04-15 MED ORDER — OXYCODONE HCL 5 MG PO TABS
5.0000 mg | ORAL_TABLET | ORAL | Status: DC | PRN
  Administered 2019-04-15 – 2019-04-16 (×2): 10 mg via ORAL
  Filled 2019-04-15 (×2): qty 2

## 2019-04-15 MED ORDER — ONDANSETRON HCL 4 MG PO TABS: 4.0000 mg | ORAL_TABLET | Freq: Four times a day (QID) | ORAL | Status: DC | PRN

## 2019-04-15 MED ORDER — DEXAMETHASONE SODIUM PHOSPHATE 10 MG/ML IJ SOLN
INTRAMUSCULAR | Status: DC | PRN
  Administered 2019-04-15: 5 mg via INTRAVENOUS

## 2019-04-15 MED ORDER — ACETAMINOPHEN 10 MG/ML IV SOLN: 1000.0000 mg | Freq: Once | INTRAVENOUS | Status: DC | PRN

## 2019-04-15 MED ORDER — PROPOFOL 10 MG/ML IV BOLUS
INTRAVENOUS | Status: AC
  Filled 2019-04-15: qty 20

## 2019-04-15 MED ORDER — VANCOMYCIN HCL IN DEXTROSE 1-5 GM/200ML-% IV SOLN
INTRAVENOUS | Status: AC
  Administered 2019-04-15: 1000 mg via INTRAVENOUS
  Filled 2019-04-15: qty 200

## 2019-04-15 MED ORDER — ACETAMINOPHEN 325 MG PO TABS: 325.0000 mg | ORAL_TABLET | Freq: Four times a day (QID) | ORAL | Status: DC | PRN

## 2019-04-15 MED ORDER — STERILE WATER FOR IRRIGATION IR SOLN
Status: DC | PRN
  Administered 2019-04-15: 1000 mL

## 2019-04-15 MED ORDER — METOCLOPRAMIDE HCL 5 MG PO TABS: 5.0000 mg | ORAL_TABLET | Freq: Three times a day (TID) | ORAL | Status: DC | PRN

## 2019-04-15 MED ORDER — BUPIVACAINE LIPOSOME 1.3 % IJ SUSP
INTRAMUSCULAR | Status: DC | PRN
  Administered 2019-04-15: 10 mL via PERINEURAL

## 2019-04-15 MED ORDER — ASPIRIN 81 MG PO CHEW
81.0000 mg | CHEWABLE_TABLET | Freq: Every day | ORAL | Status: DC
  Administered 2019-04-15 – 2019-04-16 (×2): 81 mg via ORAL
  Filled 2019-04-15 (×2): qty 1

## 2019-04-15 SURGICAL SUPPLY — 89 items
AID PSTN UNV HD RSTRNT DISP (MISCELLANEOUS) ×1
ALCOHOL 70% 16 OZ (MISCELLANEOUS) ×3 IMPLANT
APL PRP STRL LF DISP 70% ISPRP (MISCELLANEOUS) ×2
AUG COMP REV MI TAPER ADAPTER (Joint) ×3 IMPLANT
AUGMENT COMP REV MI TAPR ADPTR (Joint) ×1 IMPLANT
BEARING HUMERAL SHLDER 36M STD (Shoulder) ×1 IMPLANT
BIT DRILL 2.7 W/STOP DISP (BIT) ×1 IMPLANT
BIT DRILL 2.7MM W/STOP DISP (BIT) ×1
BIT DRILL TWIST 2.7 (BIT) ×2 IMPLANT
BIT DRILL TWIST 2.7MM (BIT) ×1
BLADE SAW SGTL 13X75X1.27 (BLADE) ×3 IMPLANT
BRNG HUM STD 36 RVRS SHLDR (Shoulder) ×1 IMPLANT
BSPLAT GLND SM AUG TPR ADPR (Joint) ×1 IMPLANT
CHLORAPREP W/TINT 26 (MISCELLANEOUS) ×6 IMPLANT
CLOSURE STERI-STRIP 1/2X4 (GAUZE/BANDAGES/DRESSINGS) ×2
CLOSURE WOUND 1/2 X4 (GAUZE/BANDAGES/DRESSINGS) ×1
CLSR STERI-STRIP ANTIMIC 1/2X4 (GAUZE/BANDAGES/DRESSINGS) ×4 IMPLANT
COVER SURGICAL LIGHT HANDLE (MISCELLANEOUS) ×3 IMPLANT
COVER WAND RF STERILE (DRAPES) ×3 IMPLANT
DRAPE INCISE IOBAN 66X45 STRL (DRAPES) ×3 IMPLANT
DRAPE U-SHAPE 47X51 STRL (DRAPES) ×6 IMPLANT
DRSG AQUACEL AG ADV 3.5X10 (GAUZE/BANDAGES/DRESSINGS) ×3 IMPLANT
ELECT BLADE 4.0 EZ CLEAN MEGAD (MISCELLANEOUS)
ELECT REM PT RETURN 9FT ADLT (ELECTROSURGICAL) ×3
ELECTRODE BLDE 4.0 EZ CLN MEGD (MISCELLANEOUS) IMPLANT
ELECTRODE REM PT RTRN 9FT ADLT (ELECTROSURGICAL) ×1 IMPLANT
GAUZE SPONGE 4X4 12PLY STRL LF (GAUZE/BANDAGES/DRESSINGS) ×3 IMPLANT
GLENOID SPHERE STD STRL 36MM (Orthopedic Implant) ×2 IMPLANT
GLOVE BIOGEL PI IND STRL 7.5 (GLOVE) ×1 IMPLANT
GLOVE BIOGEL PI IND STRL 8 (GLOVE) ×1 IMPLANT
GLOVE BIOGEL PI INDICATOR 7.5 (GLOVE) ×2
GLOVE BIOGEL PI INDICATOR 8 (GLOVE) ×2
GLOVE ECLIPSE 7.0 STRL STRAW (GLOVE) ×3 IMPLANT
GLOVE SURG ORTHO 8.0 STRL STRW (GLOVE) ×3 IMPLANT
GOWN STRL REUS W/ TWL LRG LVL3 (GOWN DISPOSABLE) ×2 IMPLANT
GOWN STRL REUS W/ TWL XL LVL3 (GOWN DISPOSABLE) ×1 IMPLANT
GOWN STRL REUS W/TWL LRG LVL3 (GOWN DISPOSABLE) ×6
GOWN STRL REUS W/TWL XL LVL3 (GOWN DISPOSABLE) ×3
GUIDE MODEL REV SHLD RT (ORTHOPEDIC DISPOSABLE SUPPLIES) ×2 IMPLANT
HYDROGEN PEROXIDE 16OZ (MISCELLANEOUS) ×3 IMPLANT
KIT BASIN OR (CUSTOM PROCEDURE TRAY) ×3 IMPLANT
KIT TURNOVER KIT B (KITS) ×3 IMPLANT
MANIFOLD NEPTUNE II (INSTRUMENTS) ×3 IMPLANT
NDL HYPO 25GX1X1/2 BEV (NEEDLE) IMPLANT
NDL SUT 6 .5 CRC .975X.05 MAYO (NEEDLE) ×1 IMPLANT
NEEDLE HYPO 25GX1X1/2 BEV (NEEDLE) IMPLANT
NEEDLE MAYO TAPER (NEEDLE) ×3
NS IRRIG 1000ML POUR BTL (IV SOLUTION) ×3 IMPLANT
PACK SHOULDER (CUSTOM PROCEDURE TRAY) ×3 IMPLANT
PAD ARMBOARD 7.5X6 YLW CONV (MISCELLANEOUS) ×6 IMPLANT
PIN HUMERAL STMN 3.2MMX9IN (INSTRUMENTS) ×6 IMPLANT
PIN STEINMANN THREADED TIP (PIN) ×2 IMPLANT
REAMER GUIDE BUSHING SURG DISP (MISCELLANEOUS) ×3 IMPLANT
REAMER GUIDE W/SCREW AUG (MISCELLANEOUS) ×2 IMPLANT
RESTRAINT HEAD UNIVERSAL NS (MISCELLANEOUS) ×3 IMPLANT
RETRIEVER SUT HEWSON (MISCELLANEOUS) ×3 IMPLANT
SCREW BONE LOCKING 4.75X30X3.5 (Screw) ×3 IMPLANT
SCREW BONE STRL 6.5MMX25MM (Screw) ×2 IMPLANT
SCREW LOCKING 4.75MMX15MM (Screw) ×6 IMPLANT
SCREW LOCKING STRL 4.75X25X3.5 (Screw) ×2 IMPLANT
SHOULDER HUMERAL BEAR 36M STD (Shoulder) ×3 IMPLANT
SLING ARM IMMOBILIZER LRG (SOFTGOODS) ×3 IMPLANT
SOL PREP POV-IOD 4OZ 10% (MISCELLANEOUS) ×3 IMPLANT
SPONGE LAP 18X18 RF (DISPOSABLE) ×3 IMPLANT
STEM HUMERAL STRL 12MMX83MM (Stem) ×2 IMPLANT
STRIP CLOSURE SKIN 1/2X4 (GAUZE/BANDAGES/DRESSINGS) ×2 IMPLANT
SUCTION FRAZIER HANDLE 10FR (MISCELLANEOUS) ×2
SUCTION TUBE FRAZIER 10FR DISP (MISCELLANEOUS) ×1 IMPLANT
SUT BROADBAND TAPE 2PK 1.5 (SUTURE) ×4 IMPLANT
SUT FIBERWIRE #2 38 T-5 BLUE (SUTURE)
SUT MAXBRAID (SUTURE) IMPLANT
SUT MNCRL AB 3-0 PS2 18 (SUTURE) ×3 IMPLANT
SUT VIC AB 0 CT1 27 (SUTURE) ×6
SUT VIC AB 0 CT1 27XBRD ANBCTR (SUTURE) ×2 IMPLANT
SUT VIC AB 1 CT1 27 (SUTURE) ×3
SUT VIC AB 1 CT1 27XBRD ANBCTR (SUTURE) ×1 IMPLANT
SUT VIC AB 2-0 CT1 27 (SUTURE) ×6
SUT VIC AB 2-0 CT1 TAPERPNT 27 (SUTURE) ×2 IMPLANT
SUT VICRYL 0 UR6 27IN ABS (SUTURE) ×10 IMPLANT
SUTURE FIBERWR #2 38 T-5 BLUE (SUTURE) IMPLANT
SUTURE TAPE 1.3 40 TPR END (SUTURE) IMPLANT
SUTURETAPE 1.3 40 TPR END (SUTURE)
SYR CONTROL 10ML LL (SYRINGE) IMPLANT
TOWEL GREEN STERILE (TOWEL DISPOSABLE) ×3 IMPLANT
TOWEL GREEN STERILE FF (TOWEL DISPOSABLE) ×3 IMPLANT
TRAY FOL W/BAG SLVR 16FR STRL (SET/KITS/TRAYS/PACK) IMPLANT
TRAY FOLEY W/BAG SLVR 16FR LF (SET/KITS/TRAYS/PACK)
TRAY HUM REV SHOULDER STD +6 (Shoulder) ×3 IMPLANT
WATER STERILE IRR 1000ML POUR (IV SOLUTION) ×3 IMPLANT

## 2019-04-15 NOTE — Plan of Care (Signed)
  Problem: Education: Goal: Knowledge of the prescribed therapeutic regimen will improve Outcome: Progressing Goal: Understanding of activity limitations/precautions following surgery will improve Outcome: Progressing   Problem: Activity: Goal: Ability to tolerate increased activity will improve Outcome: Progressing   Problem: Pain Management: Goal: Pain level will decrease with appropriate interventions Outcome: Progressing   Problem: Education: Goal: Knowledge of General Education information will improve Description: Including pain rating scale, medication(s)/side effects and non-pharmacologic comfort measures Outcome: Progressing   Problem: Health Behavior/Discharge Planning: Goal: Ability to manage health-related needs will improve Outcome: Progressing   Problem: Clinical Measurements: Goal: Ability to maintain clinical measurements within normal limits will improve Outcome: Progressing Goal: Will remain free from infection Outcome: Progressing Goal: Diagnostic test results will improve Outcome: Progressing Goal: Respiratory complications will improve Outcome: Progressing Goal: Cardiovascular complication will be avoided Outcome: Progressing

## 2019-04-15 NOTE — H&P (Signed)
Deborah Mullins is an 68 y.o. female.   Chief Complaint: Right shoulder pain HPI: Deborah Mullins is a 68 year old patient with right shoulder pain.  She reports significant pain with active and passive range of motion of the shoulder.  She is had good result with left reverse shoulder replacement.  Presents now for right shoulder replacement which based on evaluation will likely be reverse replacement based on the status of her rotator cuff.  Patient understands the risk and benefits.  She wishes to proceed.  Past Medical History:  Diagnosis Date  . Allergic rhinitis   . Anxiety   . Arthritis   . Bipolar 1 disorder ([place])   . Breast cancer ([place]) 2004  . Carpal tunnel syndrome of left wrist   . Chronic neck pain   . Chronic shoulder pain    L>R  . Depression   . GERD (gastroesophageal reflux disease)   . Headache(784.0)   . History of lung surgery   . Hyperlipidemia   . Hypertension   . Lung cancer ([place])   . Lung nodule 2009  . Osteoarthritis of right shoulder   . Pain in both feet   . Personal history of radiation therapy 2004  . PONV (postoperative nausea and vomiting)   . Type 2 diabetes mellitus ([place])     Past Surgical History:  Procedure Laterality Date  . BREAST SURGERY  2005   lumpectomy - left  . carpel tunnel Bilateral   . CESAREAN SECTION  1971  . COLONOSCOPY WITH PROPOFOL N/A 07/25/2016   Procedure: COLONOSCOPY WITH PROPOFOL;  Surgeon: Danie Binder, MD;  Location: [facility];  Service: Endoscopy;  Laterality: N/A;  8:15 AM  . EYE SURGERY    . LUNG CANCER SURGERY Right 05/23/2012   reportedly clear  . TONSILLECTOMY    . TOTAL SHOULDER ARTHROPLASTY Left 08/27/2018   Procedure: LEFT TOTAL SHOULDER ARTHROPLASTY;  Surgeon: Meredith Pel, MD;  Location: [facility];  Service: Orthopedics;  Laterality: Left;  . TUBAL LIGATION      Family History  Problem Relation Age of Onset  . Heart disease Mother        CHF  . Physical abuse Mother   . Depression Mother   .  Cancer Sister   . ADD / ADHD Sister   . Alcohol abuse Father   . Alcohol abuse Brother   . Mental illness Brother   . Cancer Sister   . Heart disease Sister   . Physical abuse Sister   . OCD Sister   . Mental illness Sister   . Anxiety disorder Neg Hx   . Bipolar disorder Neg Hx   . Dementia Neg Hx   . Drug abuse Neg Hx   . Paranoid behavior Neg Hx   . Schizophrenia Neg Hx   . Seizures Neg Hx   . Sexual abuse Neg Hx    Social History:  reports that she has been smoking cigarettes. She has been smoking about 0.50 packs per day for the past 0.00 years. She has never used smokeless tobacco. She reports current alcohol use. She reports current drug use. Drug: Marijuana.  Allergies:  Allergies  Allergen Reactions  . Bee Venom Anaphylaxis  . Penicillins Rash and Other (See Comments)    PATIENT HAS HAD A PCN REACTION WITH IMMEDIATE RASH, FACIAL/TONGUE/THROAT SWELLING, SOB, OR LIGHTHEADEDNESS WITH HYPOTENSION:  #  #  YES  #  #  Has patient had a PCN reaction causing severe rash involving mucus membranes  or skin necrosis: No Has patient had a PCN reaction that required hospitalization No Has patient had a PCN reaction occurring within the last 10 years: No      Medications Prior to Admission  Medication Sig Dispense Refill  . albuterol (PROVENTIL HFA;VENTOLIN HFA) 108 (90 Base) MCG/ACT inhaler Inhale 2 puffs into the lungs every 4 (four) hours as needed for wheezing or shortness of breath. 1 Inhaler 0  . aspirin 81 MG tablet Take 1 tablet (81 mg total) by mouth daily. 30 tablet 11  . blood glucose meter kit and supplies KIT Check up to twice a day before meals 1 each 6  . buPROPion (WELLBUTRIN SR) 150 MG 12 hr tablet Take 1 tablet (150 mg total) by mouth 2 (two) times daily. 180 tablet 1  . cetirizine (ZYRTEC) 10 MG tablet Take 1 tablet (10 mg total) by mouth daily. 30 tablet 11  . EPINEPHrine 0.3 mg/0.3 mL IJ SOAJ injection Inject 0.3 mLs (0.3 mg total) into the muscle as needed for  anaphylaxis. 2 Device 1  . FLUoxetine (PROZAC) 40 MG capsule Take 1 capsule (40 mg total) by mouth daily. 90 capsule 1  . fluticasone (FLONASE) 50 MCG/ACT nasal spray Place 2 sprays into both nostrils daily. (Patient taking differently: Place 2 sprays into both nostrils daily as needed for allergies. ) 16 g 6  . gabapentin (NEURONTIN) 800 MG tablet Take 1 tablet (800 mg total) by mouth 2 (two) times daily. 180 tablet 1  . glucose blood test strip Check up to twice a day 100 each 1  . Lancets (ONETOUCH ULTRASOFT) lancets Use as instructed 100 each 1  . losartan (COZAAR) 25 MG tablet Take 25 mg by mouth 2 (two) times daily.    . metFORMIN (GLUCOPHAGE) 500 MG tablet Take 1 tablet (500 mg total) by mouth 2 (two) times daily with a meal. 180 tablet 1  . methocarbamol (ROBAXIN) 500 MG tablet Take 1 tablet (500 mg total) by mouth every 8 (eight) hours as needed for muscle spasms. 90 tablet 0  . NONFORMULARY OR COMPOUNDED ITEM Apply 1 application topically 2 (two) times daily as needed (foot pain). Ibuprofen 15%, Baclofen 2%-3%, Lidocaine Gaba Cream Base    . pantoprazole (PROTONIX) 40 MG tablet Take 1 tablet (40 mg total) by mouth daily. 90 tablet 1  . rosuvastatin (CRESTOR) 40 MG tablet Take 1 tablet (40 mg total) by mouth daily. 90 tablet 1  . HYDROcodone-acetaminophen (NORCO/VICODIN) 5-325 MG tablet 1 po q d prn pain (Patient not taking: Reported on 04/03/2019) 30 tablet 0  . HYDROcodone-acetaminophen (NORCO/VICODIN) 5-325 MG tablet Take 1 tablet by mouth daily. 30 tablet 0  . losartan (COZAAR) 50 MG tablet Take 1 tablet (50 mg total) by mouth daily. (Patient not taking: Reported on 04/03/2019) 90 tablet 3  . methocarbamol (ROBAXIN) 750 MG tablet 1 PO QHS PRN SPASM (Patient not taking: Reported on 12/24/2018) 45 tablet 0  . triamcinolone ointment (KENALOG) 0.1 % Apply 1 application topically 2 (two) times daily. (Patient not taking: Reported on 04/03/2019) 30 g 0    Results for orders placed or performed  during the hospital encounter of 04/15/19 (from the past 48 hour(s))  Glucose, capillary     Status: None   Collection Time: 04/15/19  9:39 AM  Result Value Ref Range   Glucose-Capillary 75 70 - 99 mg/dL   No results found.  Review of Systems  Musculoskeletal: Positive for joint pain.  All other systems reviewed and are negative.  Blood pressure 129/67, pulse 73, temperature 98 F (36.7 C), temperature source Oral, resp. rate 18, height 5' 3.5" (1.613 m), weight 67.8 kg, SpO2 95 %. Physical Exam  Constitutional: She appears well-developed.  HENT:  Head: Normocephalic.  Eyes: Pupils are equal, round, and reactive to light.  Neck: Normal range of motion.  Cardiovascular: Normal rate.  Respiratory: Effort normal.  Neurological: She is alert.  Skin: Skin is warm.  Psychiatric: She has a normal mood and affect.  Examination of the right shoulder demonstrates forward flexion abduction to about 70 degrees.  Motor function to the deltoid is intact.  Radial pulses intact.  External rotation of 15 degrees of abduction is about 20 to 25 degrees.  Passive range of motion is painful on the right-hand side  Assessment/Plan Impression is right shoulder arthritis plan is right reverse shoulder replacement.  Looks like she may have some rotator cuff pathology based on the mild weakness with supraspinatus testing on exam.  She is had a good result with the reverse replacement on the left.  Risk and benefits discussed include not limited to infection nerve vessel damage incomplete pain restoration and incomplete functional restoration.  Patient understands the risk and benefits.  All questions answered  Anderson Malta, MD 04/15/2019, 10:48 AM

## 2019-04-15 NOTE — Transfer of Care (Signed)
Immediate Anesthesia Transfer of Care Note  Patient: Deborah Mullins  Procedure(s) Performed: RIGHT TOTAL SHOULDER ARTHROPLASTY (Right )  Patient Location: PACU  Anesthesia Type:General  Level of Consciousness: drowsy and patient cooperative  Airway & Oxygen Therapy: Patient Spontanous Breathing  Post-op Assessment: Report given to RN and Post -op Vital signs reviewed and stable  Post vital signs: Reviewed and stable  Last Vitals:  Vitals Value Taken Time  BP 113/68 04/15/19 1500  Temp    Pulse 83 04/15/19 1501  Resp 19 04/15/19 1501  SpO2 96 % 04/15/19 1501  Vitals shown include unvalidated device data.  Last Pain:  Vitals:   04/15/19 1057  TempSrc:   PainSc: 0-No pain         Complications: No apparent anesthesia complications

## 2019-04-15 NOTE — Anesthesia Preprocedure Evaluation (Addendum)
Anesthesia Evaluation  Patient identified by MRN, date of birth, ID band Patient awake    Reviewed: Allergy & Precautions, NPO status , Patient's Chart, lab work & pertinent test results  History of Anesthesia Complications (+) PONV and history of anesthetic complications  Airway Mallampati: II  TM Distance: >3 FB Neck ROM: Full    Dental  (+) Teeth Intact, Dental Advisory Given   Pulmonary COPD, Current Smoker,    breath sounds clear to auscultation       Cardiovascular hypertension, Pt. on medications  Rhythm:Regular Rate:Normal     Neuro/Psych  Headaches, PSYCHIATRIC DISORDERS Anxiety Depression Bipolar Disorder    GI/Hepatic GERD  Medicated,  Endo/Other  diabetes, Type 2, Oral Hypoglycemic Agents  Renal/GU      Musculoskeletal  (+) Arthritis ,   Abdominal Normal abdominal exam  (+)   Peds  Hematology negative hematology ROS (+)   Anesthesia Other Findings   Reproductive/Obstetrics                            Anesthesia Physical Anesthesia Plan  ASA: III  Anesthesia Plan: General   Post-op Pain Management: GA combined w/ Regional for post-op pain   Induction: Intravenous  PONV Risk Score and Plan: 4 or greater and Ondansetron, Dexamethasone, Midazolam and Treatment may vary due to age or medical condition  Airway Management Planned: Oral ETT  Additional Equipment: None  Intra-op Plan:   Post-operative Plan: Extubation in OR  Informed Consent: I have reviewed the patients History and Physical, chart, labs and discussed the procedure including the risks, benefits and alternatives for the proposed anesthesia with the patient or authorized representative who has indicated his/her understanding and acceptance.     Dental advisory given  Plan Discussed with: CRNA  Anesthesia Plan Comments: (COVID-19 Labs  No results for input(s): DDIMER, FERRITIN, LDH, CRP in the last 72  hours.  Lab Results      Component                Value               Date                      [place]              NEGATIVE            04/11/2019                [place]              Not Detected        02/20/2019            )       Anesthesia Quick Evaluation

## 2019-04-15 NOTE — Brief Op Note (Signed)
   04/15/2019  2:30 PM  PATIENT:  Deborah Mullins  68 y.o. female  PRE-OPERATIVE DIAGNOSIS:  right shoulder osteoarthritis  POST-OPERATIVE DIAGNOSIS:  right shoulder osteoarthritis  PROCEDURE:  Procedure(s): RIGHT TOTAL SHOULDER ARTHROPLASTY  SURGEON:  Surgeon(s): Meredith Pel, MD  ASSISTANT: magnant pa  ANESTHESIA:   general  EBL: 100 ml    Total I/O In: 1000 [I.V.:1000] Out: 150 [Blood:150]  BLOOD ADMINISTERED: none  DRAINS: none   LOCAL MEDICATIONS USED:  vanco powder  SPECIMEN:  No Specimen  COUNTS:  YES  TOURNIQUET:  * No tourniquets in log *  DICTATION: .Other Dictation: Dictation Number [id number]  PLAN OF CARE: Admit for overnight observation  PATIENT DISPOSITION:  PACU - hemodynamically stable

## 2019-04-15 NOTE — Anesthesia Procedure Notes (Signed)
Anesthesia Regional Block: Interscalene brachial plexus block   Pre-Anesthetic Checklist: ,, timeout performed, Correct Patient, Correct Site, Correct Laterality, Correct Procedure, Correct Position, site marked, Risks and benefits discussed,  Surgical consent,  Pre-op evaluation,  At surgeon's request and post-op pain management  Laterality: Right  Prep: chloraprep       Needles:  Injection technique: Single-shot  Needle Type: Echogenic Stimulator Needle     Needle Length: 9cm  Needle Gauge: 21     Additional Needles:   Procedures:,,,, ultrasound used (permanent image in chart),,,,  Narrative:  Start time: 04/15/2019 10:40 AM End time: 04/15/2019 10:50 AM Injection made incrementally with aspirations every 5 mL.  Performed by: Personally  Anesthesiologist: Effie Berkshire, MD  Additional Notes: Patient tolerated the procedure well. Local anesthetic introduced in an incremental fashion under minimal resistance after negative aspirations. No paresthesias were elicited. After completion of the procedure, no acute issues were identified and patient continued to be monitored by RN.

## 2019-04-15 NOTE — Anesthesia Postprocedure Evaluation (Signed)
Anesthesia Post Note  Patient: Deborah Mullins  Procedure(s) Performed: RIGHT TOTAL SHOULDER ARTHROPLASTY (Right )     Patient location during evaluation: PACU Anesthesia Type: General Level of consciousness: awake and alert Pain management: pain level controlled Vital Signs Assessment: post-procedure vital signs reviewed and stable Respiratory status: spontaneous breathing, nonlabored ventilation, respiratory function stable and patient connected to nasal cannula oxygen Cardiovascular status: blood pressure returned to baseline and stable Postop Assessment: no apparent nausea or vomiting Anesthetic complications: no    Last Vitals:  Vitals:   04/15/19 1624 04/15/19 1644  BP:  130/75  Pulse: 65 73  Resp: (!) 23 16  Temp: (!) 36.1 C 36.5 C  SpO2: 99% 92%    Last Pain:  Vitals:   04/15/19 1644  TempSrc: Oral  PainSc:                  Effie Berkshire

## 2019-04-16 ENCOUNTER — Other Ambulatory Visit: Payer: Self-pay

## 2019-04-16 ENCOUNTER — Encounter (HOSPITAL_COMMUNITY): Payer: Self-pay | Admitting: Orthopedic Surgery

## 2019-04-16 LAB — GLUCOSE, CAPILLARY: Glucose-Capillary: 123 mg/dL — ABNORMAL HIGH (ref 70–99)

## 2019-04-16 MED ORDER — LOSARTAN POTASSIUM 50 MG PO TABS
25.0000 mg | ORAL_TABLET | Freq: Two times a day (BID) | ORAL | Status: DC
  Administered 2019-04-16: 25 mg via ORAL
  Filled 2019-04-16: qty 1

## 2019-04-16 MED ORDER — METHOCARBAMOL 750 MG PO TABS: ORAL_TABLET | ORAL | 0 refills | Status: DC

## 2019-04-16 MED ORDER — FLUOXETINE HCL 20 MG PO CAPS
40.0000 mg | ORAL_CAPSULE | Freq: Every day | ORAL | Status: DC
  Administered 2019-04-16: 40 mg via ORAL
  Filled 2019-04-16: qty 2

## 2019-04-16 MED ORDER — GABAPENTIN 800 MG PO TABS: 800.0000 mg | ORAL_TABLET | Freq: Two times a day (BID) | ORAL | Status: DC

## 2019-04-16 MED ORDER — METHOCARBAMOL 500 MG PO TABS: 500.0000 mg | ORAL_TABLET | Freq: Three times a day (TID) | ORAL | 0 refills | Status: DC | PRN

## 2019-04-16 MED ORDER — LORATADINE 10 MG PO TABS
10.0000 mg | ORAL_TABLET | Freq: Every day | ORAL | Status: DC
  Administered 2019-04-16: 10 mg via ORAL
  Filled 2019-04-16: qty 1

## 2019-04-16 MED ORDER — BUPROPION HCL ER (SR) 150 MG PO TB12
150.0000 mg | ORAL_TABLET | Freq: Two times a day (BID) | ORAL | Status: DC
  Administered 2019-04-16: 150 mg via ORAL
  Filled 2019-04-16: qty 1

## 2019-04-16 MED ORDER — ROSUVASTATIN CALCIUM 20 MG PO TABS
40.0000 mg | ORAL_TABLET | Freq: Every day | ORAL | Status: DC
  Administered 2019-04-16: 40 mg via ORAL
  Filled 2019-04-16: qty 2

## 2019-04-16 MED ORDER — HYDROCODONE-ACETAMINOPHEN 10-325 MG PO TABS: 1.0000 | ORAL_TABLET | ORAL | 0 refills | Status: DC | PRN

## 2019-04-16 MED ORDER — ACETAMINOPHEN 325 MG PO TABS: 325.0000 mg | ORAL_TABLET | Freq: Four times a day (QID) | ORAL | 0 refills | Status: DC | PRN

## 2019-04-16 MED ORDER — MAGNESIUM CITRATE PO SOLN
1.0000 | Freq: Once | ORAL | Status: AC
  Administered 2019-04-16: 1 via ORAL
  Filled 2019-04-16: qty 296

## 2019-04-16 MED ORDER — GABAPENTIN 400 MG PO CAPS
800.0000 mg | ORAL_CAPSULE | Freq: Two times a day (BID) | ORAL | Status: DC
  Administered 2019-04-16: 800 mg via ORAL
  Filled 2019-04-16: qty 2

## 2019-04-16 NOTE — Progress Notes (Signed)
  Subjective: Deborah Mullins is a 69 y.o. female s/p Right RSA.  They are POD1.  Pt's pain is under control.  Pt's block is still in effect.  Pt wants to be discharged home today.    Objective: Vital signs in last 24 hours: Temp:  [96.8 F (36 C)-98.3 F (36.8 C)] 98.3 F (36.8 C) (09/02 0349) Pulse Rate:  [64-88] 73 (09/02 0349) Resp:  [13-23] 17 (09/02 0349) BP: (111-156)/(66-87) 131/74 (09/02 0349) SpO2:  [92 %-100 %] 97 % (09/02 0349) Weight:  [67.8 kg] 67.8 kg (09/01 1028)  Intake/Output from previous day: 09/01 0701 - 09/02 0700 In: 2665 [P.O.:240; I.V.:2425] Out: 750 [Urine:600; Blood:150] Intake/Output this shift: No intake/output data recorded.  Exam:  No gross blood or drainage overlying the dressing 2+ radial pulse Numbness of the right hand Able to flex and extend all five fingers, not able to extend wrist yet   Labs: No results for input(s): HGB in the last 72 hours. No results for input(s): WBC, RBC, HCT, PLT in the last 72 hours. No results for input(s): NA, K, CL, CO2, BUN, CREATININE, GLUCOSE, CALCIUM in the last 72 hours. No results for input(s): LABPT, INR in the last 72 hours.  Assessment/Plan: Pt is POD1 s/p Right RSA    -Plan to discharge to home today or tomorrow pending patient's pain   -No lifting with the Right arm  -Use the CPM machine at least 3 times per day for one hour each time, increasing the degrees daily.     Donnette Macmullen L Felicitas Sine 04/16/2019, 7:43 AM

## 2019-04-16 NOTE — Progress Notes (Signed)
Orthopedic Tech Progress Note Patient Details:  Deborah Mullins 05-08-1951 [id number]  Patient ID: Deborah Mullins, female   DOB: 08-21-1950, 68 y.o.   MRN: [id number]  I was requested to reapply sling immobilizer.  Karolee Stamps 04/16/2019, 1:32 AM

## 2019-04-16 NOTE — Progress Notes (Signed)
Pt given discharge instructions and gone over with her and significant other in room. All questions answered. Pt gathered all belongings to go home. Pt in no distress at discharge.

## 2019-04-16 NOTE — Progress Notes (Signed)
PT Cancellation Note  Patient Details Name: RAYLIN DIGUGLIELMO MRN: [id number] DOB: 13-Mar-1951   Cancelled Treatment:    Reason Eval/Treat Not Completed: PT screened, no needs identified, will sign off. After OT evaluation, pt declined PT eval. Please reconsult if new needs arise.  Mabeline Caras, PT, DPT Acute Rehabilitation Services  Pager [phone] Office [phone]  Derry Lory 04/16/2019, 9:29 AM

## 2019-04-16 NOTE — Op Note (Signed)
NAMEARIYON, GERSTENBERGER MEDICAL RECORD ZD:[id number] ACCOUNT [id number] DATE OF BIRTH:May 29, 1951 FACILITY: [facility] LOCATION: [facility] PHYSICIAN:GREGORY Randel Pigg, MD  OPERATIVE REPORT  DATE OF PROCEDURE:  04/15/2019  PREOPERATIVE DIAGNOSIS:  Right shoulder arthritis.  POSTOPERATIVE DIAGNOSIS:  Right shoulder arthritis.  PROCEDURE:  Right shoulder reverse replacement using Biomet components including small augmented baseplate with glenosphere 36 standard and 1 central nonlocking compression screw, 4 peripheral locking screws with mini-humeral tray, standard thickness +6  mm taper offset, 40 mm diameter with cross-linked polyethylene standard 36 mm diameter bearing.  SURGEON:  Meredith Pel, MD  ASSISTANT:  Annie Main, PA-C   INDICATIONS:  The patient is a 68 year old patient with right shoulder arthritis, end-stage, with significant rotator cuff tendinitis.  She presents now for operative management after explanation of risks and benefits.  She has had a very good result on  the left-hand side with shoulder replacement.  PROCEDURE IN DETAIL:  The patient was brought to the operating room where general anesthetic was induced.  Preoperative antibiotics were administered.  Time-out was called.  The patient was placed in the beach-chair position with the head in neutral  position.  Right arm prescrubbed with hydrogen peroxide, alcohol, and Betadine, which was allowed to air dry, then prepped with DuraPrep solution and draped in a sterile manner.  Ioban used to cover the entire operative field.  After calling time-out, a  deltopectoral approach was made.  Skin and subcutaneous tissue were sharply divided.  Cephalic vein mobilized medially.  Biceps was tenodesed to the pec tendon.  Rotator interval opened up to the base of the coracoid.  Circumflex vessels were ligated.   Axillary nerve was visualized and a vessel loop placed around it.  It was protected at all times during the remaining portion  of the case.  At this time, the subscapularis was detached using 2 stay sutures.  A capsular attachment was also performed 2 cm  below the humeral neck.  Osteophytes were removed.  This was taken down to the 5 o'clock position.  Rotator cuff had significant tendinitis and intrinsic degeneration of primarily the supraspinatus, but also to some degree of the infraspinatus.   Following attachment of the subscap, a 360-degree release was performed.  The humeral head was then entered and the canal was reamed.  Head cut was then made in 30 degrees of retroversion with neurovascular structures protected.  This was later revised  down about 3 more millimeters just for added space.  At this time, a broach was placed with good purchase obtained and a cap was placed.  Attention was then directed towards the glenoid.  Posterior retractor was placed.  A circumferential labral  resection was performed.  Anterior and inferior glenohumeral ligaments were also released with my finger on the axillary nerve.  Following this release, the guide was placed on the glenoid with preoperative patient specific instrumentation.  Reaming was  then performed.  Excellent inferior bone bleeding over the bottom 50% was achieved.  Then, with a small augment, reaming 100% coverage was achieved.  The baseplate was then placed into position with a compression screw, which had very good fixation and 4  locking screws peripherally.  A standard glenosphere was placed with good taper fit obtained.  Attention then directed toward the humeral side.  Humerus was broached up to a size 12 mini-humeral stem.  This was placed.  Trial reduction was then performed with mini-humeral tray, standard thickness, with +6 taper offset and 36 mm diameter standard  bearing.  This gave excellent stability.  The patient had forward flexion passively and abduction passively above 90 degrees.  The patient was stable with adduction, extension, and superiorly directed  force.  Trial components were removed from the  humeral side and the true components placed.  Same stability parameters were maintained.  Four drill holes were placed through the lesser tuberosity prior to placement of the implant.  Subscap was then reattached using the Biomet suture tapes x4.  This  was done with the arm in about 40 degrees of external rotation.  Rotator interval then closed after thorough irrigation with regular irrigating solution along with Irricept, which was used throughout the case.  The rotator interval was then closed after  placing vancomycin powder within the joint.  This was done in 40 degrees of external rotation with a 0 Vicryl suture.  Deltopectoral interval was then closed using #1 Vicryl suture followed by interrupted inverted 0 Vicryl suture, 2-0 Vicryl suture, and  a 3-0 Monocryl with Steri-Strips and Aquacel dressing and shoulder immobilizer placed.  The patient tolerated the procedure well without immediate complications, transferred to the recovery room in stable condition.  Luke's assistance was required at all  times during the case for retraction, opening and closing.  His assistance was a medical necessity.  LN/NUANCE  D:04/15/2019 T:04/16/2019 JOB:[id number]

## 2019-04-16 NOTE — Evaluation (Signed)
Occupational Therapy Evaluation Patient Details Name: Deborah Mullins MRN: [id number] DOB: 01/12/1951 Today's Date: 04/16/2019    History of Present Illness s/p Right shoulder reverse replacement    Clinical Impression   Pt admitted with the above diagnoses and presents with below problem list. PTA pt was independent with ADLs. Pt is currently min guard for functional mobility and toilet/shower transfers, set up to min A with UB/LB ADLs. Pt with good recall of shoulder education and ADL strategies from L shoulder surgery in January of this year. No further acute OT needs indicated. OT signing off. Pt is for d/c home today.    Follow Up Recommendations  Follow surgeon's recommendation for DC plan and follow-up therapies    Equipment Recommendations  None recommended by OT    Recommendations for Other Services (Pt declined PT consult.)     Precautions / Restrictions Precautions Precautions: Shoulder Shoulder Interventions: Shoulder sling/immobilizer;At all times;Off for dressing/bathing/exercises Precaution Booklet Issued: Yes (comment) Precaution Comments: good recall from L shoulder surgery earlier this year Required Braces or Orthoses: Sling Restrictions Weight Bearing Restrictions: Yes RUE Weight Bearing: Non weight bearing      Mobility Bed Mobility               General bed mobility comments: sitting EOB at start of session  Transfers Overall transfer level: Needs assistance Equipment used: None Transfers: Sit to/from Stand Sit to Stand: Min guard         General transfer comment: to/from EOB, comfirt height toilet and recliner. Min guard for safety    Balance Overall balance assessment: Mild deficits observed, not formally tested;History of Falls(1 in icy conditions, 1 on stairs, 0 during L shoulder recove)                                         ADL either performed or assessed with clinical judgement   ADL Overall ADL's : Needs  assistance/impaired Eating/Feeding: Set up;Sitting   Grooming: Set up;Sitting;Min guard Grooming Details (indicate cue type and reason): Pt stood at sink to brush teeth Upper Body Bathing: Set up;Minimal assistance;Sitting   Lower Body Bathing: Min guard;Minimal assistance;Sit to/from stand   Upper Body Dressing : Set up;Minimal assistance;Sitting   Lower Body Dressing: Min guard;Minimal assistance;Sit to/from stand   Toilet Transfer: Min guard;Ambulation   Toileting- Clothing Manipulation and Hygiene: Min guard;Minimal assistance;Sit to/from stand   Tub/ Shower Transfer: Tub transfer;Min guard;Ambulation;Tub bench;Grab bars   Functional mobility during ADLs: Min guard General ADL Comments: Shoulder education completed.     Vision Baseline Vision/History: Wears glasses       Perception     Praxis      Pertinent Vitals/Pain Pain Assessment: No/denies pain     Hand Dominance Right   Extremity/Trunk Assessment Upper Extremity Assessment Upper Extremity Assessment: RUE deficits/detail RUE Deficits / Details: s/p Right shoulder reverse replacement with expected deficits. Nerve block largely in effect. AROM digits, no AROM of wrist yet. Some edema noted in R hand. Encouraged ROM exercises RUE: Unable to fully assess due to immobilization   Lower Extremity Assessment Lower Extremity Assessment: Overall WFL for tasks assessed       Communication Communication Communication: No difficulties   Cognition Arousal/Alertness: Awake/alert Behavior During Therapy: WFL for tasks assessed/performed Overall Cognitive Status: Within Functional Limits for tasks assessed  General Comments  Discussed PT eval recommendation. Pt declined.    Exercises Exercises: Other exercises Other Exercises Other Exercises: pt only able to complete digit AROM exercises this session due to nerve block   Shoulder Instructions      Home  Living Family/patient expects to be discharged to:: Private residence Living Arrangements: Spouse/significant other Available Help at Discharge: Family;Available 24 hours/day Type of Home: House Home Access: Stairs to enter [employer] of Steps: 2 Entrance Stairs-Rails: None Home Layout: One level     Bathroom Shower/Tub: Teacher, early years/pre: Standard     Home Equipment: Grab bars - tub/shower;Shower seat;Tub bench;Grab bars - toilet(bed rail)          Prior Functioning/Environment Level of Independence: Independent        Comments: some assistance with UB dressing as pain increased in R shoulder this year, baseline independent        OT Problem List: Impaired balance (sitting and/or standing);Decreased knowledge of precautions;Decreased knowledge of use of DME or AE;Pain;Impaired UE functional use      OT Treatment/Interventions:      OT Goals(Current goals can be found in the care plan section) Acute Rehab OT Goals Patient Stated Goal: home today  OT Frequency:     Barriers to D/C:            Co-evaluation              AM-PAC OT "6 Clicks" Daily Activity     Outcome Measure Help from another person eating meals?: None Help from another person taking care of personal grooming?: A Little Help from another person toileting, which includes using toliet, bedpan, or urinal?: A Little Help from another person bathing (including washing, rinsing, drying)?: A Little Help from another person to put on and taking off regular upper body clothing?: A Little Help from another person to put on and taking off regular lower body clothing?: A Little 6 Click Score: 19   End of Session Equipment Utilized During Treatment: Other (comment)(sling)  Activity Tolerance: Patient tolerated treatment well Patient left: in chair;with call bell/phone within reach  OT Visit Diagnosis: Unsteadiness on feet (R26.81);Pain;History of falling (Z91.81)                 Time: 8889-1694 OT Time Calculation (min): 24 min Charges:  OT General Charges $OT Visit: 1 Visit OT Evaluation $OT Eval Low Complexity: 1 Low OT Treatments $Self Care/Home Management : 8-22 mins  Tyrone Schimke, OT Acute Rehabilitation Services Pager: [phone] Office: [phone]   Hortencia Pilar 04/16/2019, 9:46 AM

## 2019-04-16 NOTE — Plan of Care (Signed)
  Problem: Activity: Goal: Ability to tolerate increased activity will improve Outcome: Adequate for Discharge   Problem: Pain Management: Goal: Pain level will decrease with appropriate interventions Outcome: Adequate for Discharge   Problem: Health Behavior/Discharge Planning: Goal: Ability to manage health-related needs will improve Outcome: Adequate for Discharge

## 2019-04-16 NOTE — Progress Notes (Signed)
Patient stable.  X-rays okay.  Plan discharge today with CPM machine and therapy for passive range of motion.  Follow-up in 2 weeks

## 2019-04-19 NOTE — Discharge Summary (Addendum)
Physician Discharge Summary      Patient ID: Deborah Mullins MRN: [id number] DOB/AGE: 01-13-1951 68 y.o.  Admit date: 04/15/2019 Discharge date: 04/16/2019  Admission Diagnoses:  Active Problems:   OA (osteoarthritis) of shoulder   Discharge Diagnoses:  Same  Surgeries: Procedure(s): RIGHT TOTAL SHOULDER ARTHROPLASTY on 04/15/2019   Consultants:   Discharged Condition: Stable  Hospital Course: Deborah Mullins is an 68 y.o. female who was admitted 04/15/2019 with a chief complaint of Right shoulder pain, and found to have a diagnosis of Right shoulder OA.  They were brought to the operating room on 04/15/2019 and underwent the above named procedures.  She awoke from anesthesia without complication. Pt was transferred to 5N after recovering from anesthesia. On POD1, pt was doing well and her pain was well-controlled.  She was discharged home on POD1 and will f/u with Dr. Marlou Sa in the the clinic in 2 weeks.    Antibiotics given:  Anti-infectives (From admission, onward)   Start     Dose/Rate Route Frequency Ordered Stop   04/15/19 2200  vancomycin (VANCOCIN) IVPB 1000 mg/200 mL premix     1,000 mg 200 mL/hr over 60 Minutes Intravenous Every 12 hours 04/15/19 1644 04/15/19 2328   04/15/19 1210  vancomycin (VANCOCIN) powder  Status:  Discontinued       As needed 04/15/19 1210 04/15/19 1457   04/15/19 1000  vancomycin (VANCOCIN) IVPB 1000 mg/200 mL premix     1,000 mg 200 mL/hr over 60 Minutes Intravenous On call to O.R. 04/15/19 4098 04/15/19 1115    .  Recent vital signs:  Vitals:   04/16/19 0349 04/16/19 0806  BP: 131/74 120/73  Pulse: 73 85  Resp: 17   Temp: 98.3 F (36.8 C) 97.6 F (36.4 C)  SpO2: 97% 98%    Recent laboratory studies:  Results for orders placed or performed during the hospital encounter of 04/15/19  Glucose, capillary  Result Value Ref Range   Glucose-Capillary 75 70 - 99 mg/dL  Glucose, capillary  Result Value Ref Range   Glucose-Capillary 84 70 - 99  mg/dL  Glucose, capillary  Result Value Ref Range   Glucose-Capillary 108 (H) 70 - 99 mg/dL  Glucose, capillary  Result Value Ref Range   Glucose-Capillary 123 (H) 70 - 99 mg/dL    Discharge Medications:   Allergies as of 04/16/2019      Reactions   Bee Venom Anaphylaxis   Penicillins Rash, Other (See Comments)   PATIENT HAS HAD A PCN REACTION WITH IMMEDIATE RASH, FACIAL/TONGUE/THROAT SWELLING, SOB, OR LIGHTHEADEDNESS WITH HYPOTENSION:  #  #  YES  #  #  Has patient had a PCN reaction causing severe rash involving mucus membranes or skin necrosis: No Has patient had a PCN reaction that required hospitalization No Has patient had a PCN reaction occurring within the last 10 years: No      Medication List    STOP taking these medications   HYDROcodone-acetaminophen 5-325 MG tablet Commonly known as: NORCO/VICODIN Replaced by: HYDROcodone-acetaminophen 10-325 MG tablet   triamcinolone ointment 0.1 % Commonly known as: KENALOG     TAKE these medications   albuterol 108 (90 Base) MCG/ACT inhaler Commonly known as: VENTOLIN HFA Inhale 2 puffs into the lungs every 4 (four) hours as needed for wheezing or shortness of breath.   aspirin 81 MG tablet Take 1 tablet (81 mg total) by mouth daily.   blood glucose meter kit and supplies Kit Check up to twice a day before  meals   buPROPion 150 MG 12 hr tablet Commonly known as: WELLBUTRIN SR Take 1 tablet (150 mg total) by mouth 2 (two) times daily.   cetirizine 10 MG tablet Commonly known as: ZYRTEC Take 1 tablet (10 mg total) by mouth daily.   EPINEPHrine 0.3 mg/0.3 mL Soaj injection Commonly known as: EPI-PEN Inject 0.3 mLs (0.3 mg total) into the muscle as needed for anaphylaxis.   FLUoxetine 40 MG capsule Commonly known as: PROZAC Take 1 capsule (40 mg total) by mouth daily.   fluticasone 50 MCG/ACT nasal spray Commonly known as: FLONASE Place 2 sprays into both nostrils daily. What changed:   when to take this   reasons to take this   gabapentin 800 MG tablet Commonly known as: NEURONTIN Take 1 tablet (800 mg total) by mouth 2 (two) times daily.   glucose blood test strip Check up to twice a day   HYDROcodone-acetaminophen 10-325 MG tablet Commonly known as: Norco Take 1 tablet by mouth every 4 (four) hours as needed. Replaces: HYDROcodone-acetaminophen 5-325 MG tablet   losartan 25 MG tablet Commonly known as: COZAAR Take 25 mg by mouth 2 (two) times daily. What changed: Another medication with the same name was removed. Continue taking this medication, and follow the directions you see here.   metFORMIN 500 MG tablet Commonly known as: GLUCOPHAGE Take 1 tablet (500 mg total) by mouth 2 (two) times daily with a meal.   NONFORMULARY OR COMPOUNDED ITEM Apply 1 application topically 2 (two) times daily as needed (foot pain). Ibuprofen 15%, Baclofen 2%-3%, Lidocaine Gaba Cream Base   onetouch ultrasoft lancets Use as instructed   pantoprazole 40 MG tablet Commonly known as: PROTONIX Take 1 tablet (40 mg total) by mouth daily.   rosuvastatin 40 MG tablet Commonly known as: CRESTOR Take 1 tablet (40 mg total) by mouth daily.     ASK your doctor about these medications   methocarbamol 500 MG tablet Commonly known as: Robaxin Take 1 tablet (500 mg total) by mouth every 8 (eight) hours as needed for muscle spasms. Ask about: Which instructions should I use?   methocarbamol 750 MG tablet Commonly known as: ROBAXIN 1 po bid prn Ask about: Which instructions should I use?       Diagnostic Studies: Dg Shoulder Right Port  Result Date: 04/15/2019 CLINICAL DATA:  Post shoulder surgery EXAM: PORTABLE RIGHT SHOULDER COMPARISON:  Portable exam 1512 hours compared to 01/22/2019 FINDINGS: Components of a reverse RIGHT shoulder prosthesis are identified. Bones demineralized. Degenerative changes RIGHT AC joint with inferior acromial spur formation. No fracture, dislocation, or bone  destruction. IMPRESSION: Reverse RIGHT shoulder arthroplasty without acute abnormalities. Degenerative changes RIGHT AC joint with inferior acromial spur formation. Electronically Signed   By: Lavonia Dana M.D.   On: 04/15/2019 15:27    Disposition:   Discharge Instructions    Call MD / Call 911   Complete by: As directed    If you experience chest pain or shortness of breath, CALL 911 and be transported to the hospital emergency room.  If you develope a fever above 101 F, pus (white drainage) or increased drainage or redness at the wound, or calf pain, call your surgeon's office.   Call MD / Call 911   Complete by: As directed    If you experience chest pain or shortness of breath, CALL 911 and be transported to the hospital emergency room.  If you develope a fever above 101 F, pus (white drainage) or increased drainage  or redness at the wound, or calf pain, call your surgeon's office.   Constipation Prevention   Complete by: As directed    Drink plenty of fluids.  Prune juice may be helpful.  You may use a stool softener, such as Colace (over the counter) 100 mg twice a day.  Use MiraLax (over the counter) for constipation as needed.   Constipation Prevention   Complete by: As directed    Drink plenty of fluids.  Prune juice may be helpful.  You may use a stool softener, such as Colace (over the counter) 100 mg twice a day.  Use MiraLax (over the counter) for constipation as needed.   Diet - low sodium heart healthy   Complete by: As directed    Diet - low sodium heart healthy   Complete by: As directed    Discharge instructions   Complete by: As directed    You may shower, dressing is waterproof.  Do not bathe or soak the operative shoulder in a tub, pool.  Use the CPM machine 3 times a day for 1-1.5 hours each time, increasing the degrees of range of motion daily.  No lifting with the operative shoulder. Continue use of the sling.  Follow-up with Dr. Marlou Sa in ~2 weeks on your given  appointment date.  We will remove your adhesive bandage at that time.   Discharge instructions   Complete by: As directed    No lifting with right arm CPM machine 1 hour 3 times a day.  Okay to use ice for 20 minutes, after 1 hour on the CPM machine Okay to shower dressing is waterproof   Increase activity slowly as tolerated   Complete by: As directed    Increase activity slowly as tolerated   Complete by: As directed          Signed: Donella Stade 04/19/2019, 12:07 PM

## 2019-04-24 ENCOUNTER — Ambulatory Visit: Payer: Medicare Other | Admitting: Family Medicine

## 2019-05-02 ENCOUNTER — Telehealth: Payer: Self-pay | Admitting: Orthopedic Surgery

## 2019-05-02 ENCOUNTER — Other Ambulatory Visit: Payer: Self-pay | Admitting: Orthopedic Surgery

## 2019-05-02 ENCOUNTER — Telehealth: Payer: Self-pay

## 2019-05-02 ENCOUNTER — Encounter: Payer: Self-pay | Admitting: Orthopedic Surgery

## 2019-05-02 ENCOUNTER — Other Ambulatory Visit: Payer: Self-pay

## 2019-05-02 ENCOUNTER — Ambulatory Visit (INDEPENDENT_AMBULATORY_CARE_PROVIDER_SITE_OTHER): Payer: Medicare Other | Admitting: Orthopedic Surgery

## 2019-05-02 ENCOUNTER — Ambulatory Visit (INDEPENDENT_AMBULATORY_CARE_PROVIDER_SITE_OTHER): Payer: Medicare Other

## 2019-05-02 DIAGNOSIS — Z96611 Presence of right artificial shoulder joint: Secondary | ICD-10-CM

## 2019-05-02 NOTE — Telephone Encounter (Signed)
Patient called concerning Rx for Hydrocodone being sent to her pharmacy.  CB# is [phone].  Please advise.  Thank you.

## 2019-05-04 ENCOUNTER — Encounter: Payer: Self-pay | Admitting: Orthopedic Surgery

## 2019-05-04 NOTE — Telephone Encounter (Signed)
Okay to refill hydrocodone

## 2019-05-04 NOTE — Progress Notes (Signed)
Post-Op Visit Note   Patient: Deborah Mullins           Date of Birth: [birth year]           MRN: [id number] Visit Date: 05/02/2019 PCP: Susy Frizzle, MD   Assessment & Plan:  Chief Complaint: No chief complaint on file.  Visit Diagnoses:  1. S/P reverse total shoulder arthroplasty, right     Plan: Temitope is now about 2 weeks out right reverse shoulder replacement.  On exam she has somewhat painful range of motion.  Deltoid is functional.  Incision is intact radiographs look good she can take anti-inflammatories Norco 05/16/2024 refilled today along with Robaxin.  Follow-up in 2 weeks for clinical recheck she is on her CPM machine.  She states she has been using it because it is been too painful but she needs to use it so her shoulder does not lock up.  Follow-Up Instructions: No follow-ups on file.   Orders:  Orders Placed This Encounter  Procedures  . XR Shoulder Right   No orders of the defined types were placed in this encounter.   Imaging: No results found.  PMFS History: Patient Active Problem List   Diagnosis Date Noted  . OA (osteoarthritis) of shoulder 04/15/2019  . Shoulder arthritis 08/27/2018  . Primary osteoarthritis, left shoulder 04/01/2018  . Primary osteoarthritis, right shoulder 04/01/2018  . Essential hypertension 01/22/2017  . Controlled type 2 diabetes mellitus without complication, without long-term current use of insulin ([place]) 11/23/2016  . Chronic pain of both shoulders 07/24/2016  . Prediabetes   . Anxiety   . Bipolar 1 disorder ([place])   . Depression   . Chronic back pain 11/07/2012  . Insomnia secondary to depression with anxiety 09/23/2012  . Unspecified vitamin D deficiency 09/23/2012  . COPD, mild ([place]) 07/01/2012  . Acute bronchitis 06/08/2012  . Adenocarcinoma of lung, stage 1 ([place]) 05/23/2012  . RECTAL BLEEDING 04/27/2010  . HEMORRHOIDS 03/11/2009  . ADENOCARCINOMA, BREAST 12/15/2008  . FIBROIDS, UTERUS 12/15/2008  .  HYPERCHOLESTEROLEMIA 12/15/2008  . Hyperlipidemia 12/15/2008  . Anxiety and depression 12/15/2008  . GASTROESOPHAGEAL REFLUX DISEASE 12/15/2008  . GASTRITIS 12/15/2008  . CONSTIPATION 12/15/2008  . FLATULENCE ERUCTATION AND GAS PAIN 12/15/2008  . ABDOMINAL PAIN 12/15/2008  . RECTAL BLEEDING, HX OF 12/15/2008   Past Medical History:  Diagnosis Date  . Allergic rhinitis   . Anxiety   . Arthritis   . Bipolar 1 disorder ([place])   . Breast cancer ([place]) 2004  . Carpal tunnel syndrome of left wrist   . Chronic neck pain   . Chronic shoulder pain    L>R  . Depression   . GERD (gastroesophageal reflux disease)   . Headache(784.0)   . History of lung surgery   . Hyperlipidemia   . Hypertension   . Lung cancer ([place])   . Lung nodule 2009  . Osteoarthritis of right shoulder   . Pain in both feet   . Personal history of radiation therapy 2004  . PONV (postoperative nausea and vomiting)   . Type 2 diabetes mellitus ([facility])     Family History  Problem Relation Age of Onset  . Heart disease Mother        CHF  . Physical abuse Mother   . Depression Mother   . Cancer Sister   . ADD / ADHD Sister   . Alcohol abuse Father   . Alcohol abuse Brother   . Mental illness Brother   . Cancer  Sister   . Heart disease Sister   . Physical abuse Sister   . OCD Sister   . Mental illness Sister   . Anxiety disorder Neg Hx   . Bipolar disorder Neg Hx   . Dementia Neg Hx   . Drug abuse Neg Hx   . Paranoid behavior Neg Hx   . Schizophrenia Neg Hx   . Seizures Neg Hx   . Sexual abuse Neg Hx     Past Surgical History:  Procedure Laterality Date  . BREAST SURGERY  2005   lumpectomy - left  . carpel tunnel Bilateral   . CESAREAN SECTION  1971  . COLONOSCOPY WITH PROPOFOL N/A 07/25/2016   Procedure: COLONOSCOPY WITH PROPOFOL;  Surgeon: Danie Binder, MD;  Location[facility];  Service: Endoscopy;  Laterality: N/A;  8:15 AM  . EYE SURGERY    . LUNG CANCER SURGERY Right 05/23/2012    reportedly clear  . TONSILLECTOMY    . TOTAL SHOULDER ARTHROPLASTY Left 08/27/2018   Procedure: LEFT TOTAL SHOULDER ARTHROPLASTY;  Surgeon: Meredith Pel, MD;  Location: [facility];  Service: Orthopedics;  Laterality: Left;  . TOTAL SHOULDER ARTHROPLASTY Right 04/15/2019   Procedure: RIGHT TOTAL SHOULDER ARTHROPLASTY;  Surgeon: Meredith Pel, MD;  Location: [facility];  Service: Orthopedics;  Laterality: Right;  . TUBAL LIGATION     Social History   Occupational History  . Occupation: retired    Comment: Location manager  Tobacco Use  . Smoking status: Current Every Day Smoker    Packs/day: 0.50    Years: 0.00    Pack years: 0.00    Types: Cigarettes  . Smokeless tobacco: Never Used  Substance and Sexual Activity  . Alcohol use: Yes    Comment: occasionally   . Drug use: Yes    Types: Marijuana    Comment: smoking daily  . Sexual activity: Never

## 2019-05-05 ENCOUNTER — Other Ambulatory Visit: Payer: Self-pay | Admitting: Surgical

## 2019-05-05 MED ORDER — METHOCARBAMOL 750 MG PO TABS: ORAL_TABLET | ORAL | 0 refills | Status: DC

## 2019-05-05 MED ORDER — HYDROCODONE-ACETAMINOPHEN 10-325 MG PO TABS: 1.0000 | ORAL_TABLET | Freq: Four times a day (QID) | ORAL | 0 refills | Status: DC | PRN

## 2019-05-05 NOTE — Telephone Encounter (Signed)
Can you please submit this?

## 2019-05-05 NOTE — Addendum Note (Signed)
Addended byLaurann Montana on: 05/05/2019 08:51 AM   Modules accepted: Orders

## 2019-05-16 ENCOUNTER — Ambulatory Visit (INDEPENDENT_AMBULATORY_CARE_PROVIDER_SITE_OTHER): Payer: Medicare Other | Admitting: Orthopedic Surgery

## 2019-05-16 ENCOUNTER — Ambulatory Visit (INDEPENDENT_AMBULATORY_CARE_PROVIDER_SITE_OTHER): Payer: Medicare Other

## 2019-05-16 ENCOUNTER — Encounter: Payer: Self-pay | Admitting: Orthopedic Surgery

## 2019-05-16 DIAGNOSIS — Z96611 Presence of right artificial shoulder joint: Secondary | ICD-10-CM | POA: Diagnosis not present

## 2019-05-16 MED ORDER — HYDROCODONE-ACETAMINOPHEN 10-325 MG PO TABS: ORAL_TABLET | ORAL | 0 refills | Status: DC

## 2019-05-16 MED ORDER — METHOCARBAMOL 500 MG PO TABS: 500.0000 mg | ORAL_TABLET | Freq: Three times a day (TID) | ORAL | 0 refills | Status: DC | PRN

## 2019-05-16 NOTE — Progress Notes (Signed)
Post-Op Visit Note   Patient: Deborah Mullins           Date of Birth: [birth year]           MRN: [id number] Visit Date: 05/16/2019 PCP: Deborah Frizzle, MD   Assessment & Plan:  Chief Complaint:  Chief Complaint  Patient presents with  . Right Shoulder - Follow-up   Visit Diagnoses:  1. S/P reverse total shoulder arthroplasty, right     Plan: Deborah Mullins is now month out right reverse shoulder replacement.  She still having a lot of muscular pain.  Not having any new fevers or chills.  She has been in the CPM machine but it is been painful.  Hard for her to pick up things.  Cannot take anti-inflammatories because of liver issues.  She is taking Norco but she states she is not getting addicted.  On exam the incisions intact.  Shoulder itself is not warm.  Motor function sensory function in the right hand is intact.  Radiographs unchanged.  Plan at this time is CT scan right shoulder to evaluate for possible occult fracture.  Norco prescribed.  Follow-up after that scan.  I do want her to use the arm but not necessarily for lifting but for range of motion.  Follow-Up Instructions: Return for after MRI.   Orders:  Orders Placed This Encounter  Procedures  . XR Shoulder Right  . CT SHOULDER RIGHT WO CONTRAST   Meds ordered this encounter  Medications  . HYDROcodone-acetaminophen (NORCO) 10-325 MG tablet    Sig: 1 po q 8-12hrs prn pain    Dispense:  40 tablet    Refill:  0  . methocarbamol (ROBAXIN) 500 MG tablet    Sig: Take 1 tablet (500 mg total) by mouth every 8 (eight) hours as needed for muscle spasms.    Dispense:  30 tablet    Refill:  0    Imaging: Xr Shoulder Right  Result Date: 05/16/2019 [facility] outlet axillary right shoulder reviewed.  Reverse shoulder prosthesis in good position alignment with no changes from prior radiographs.  No dislocation.  No fracture present.   PMFS History: Patient Active Problem List   Diagnosis Date Noted  . OA (osteoarthritis) of  shoulder 04/15/2019  . Shoulder arthritis 08/27/2018  . Primary osteoarthritis, left shoulder 04/01/2018  . Primary osteoarthritis, right shoulder 04/01/2018  . Essential hypertension 01/22/2017  . Controlled type 2 diabetes mellitus without complication, without long-term current use of insulin ([place]) 11/23/2016  . Chronic pain of both shoulders 07/24/2016  . Prediabetes   . Anxiety   . Bipolar 1 disorder ([place])   . Depression   . Chronic back pain 11/07/2012  . Insomnia secondary to depression with anxiety 09/23/2012  . Unspecified vitamin D deficiency 09/23/2012  . COPD, mild ([place]) 07/01/2012  . Acute bronchitis 06/08/2012  . Adenocarcinoma of lung, stage 1 ([place]) 05/23/2012  . RECTAL BLEEDING 04/27/2010  . HEMORRHOIDS 03/11/2009  . ADENOCARCINOMA, BREAST 12/15/2008  . FIBROIDS, UTERUS 12/15/2008  . HYPERCHOLESTEROLEMIA 12/15/2008  . Hyperlipidemia 12/15/2008  . Anxiety and depression 12/15/2008  . GASTROESOPHAGEAL REFLUX DISEASE 12/15/2008  . GASTRITIS 12/15/2008  . CONSTIPATION 12/15/2008  . FLATULENCE ERUCTATION AND GAS PAIN 12/15/2008  . ABDOMINAL PAIN 12/15/2008  . RECTAL BLEEDING, HX OF 12/15/2008   Past Medical History:  Diagnosis Date  . Allergic rhinitis   . Anxiety   . Arthritis   . Bipolar 1 disorder ([place])   . Breast cancer ([place]) 2004  . Carpal  tunnel syndrome of left wrist   . Chronic neck pain   . Chronic shoulder pain    L>R  . Depression   . GERD (gastroesophageal reflux disease)   . Headache(784.0)   . History of lung surgery   . Hyperlipidemia   . Hypertension   . Lung cancer ([place])   . Lung nodule 2009  . Osteoarthritis of right shoulder   . Pain in both feet   . Personal history of radiation therapy 2004  . PONV (postoperative nausea and vomiting)   . Type 2 diabetes mellitus ([facility])     Family History  Problem Relation Age of Onset  . Heart disease Mother        CHF  . Physical abuse Mother   . Depression Mother   . Cancer Sister   . ADD  / ADHD Sister   . Alcohol abuse Father   . Alcohol abuse Brother   . Mental illness Brother   . Cancer Sister   . Heart disease Sister   . Physical abuse Sister   . OCD Sister   . Mental illness Sister   . Anxiety disorder Neg Hx   . Bipolar disorder Neg Hx   . Dementia Neg Hx   . Drug abuse Neg Hx   . Paranoid behavior Neg Hx   . Schizophrenia Neg Hx   . Seizures Neg Hx   . Sexual abuse Neg Hx     Past Surgical History:  Procedure Laterality Date  . BREAST SURGERY  2005   lumpectomy - left  . carpel tunnel Bilateral   . CESAREAN SECTION  1971  . COLONOSCOPY WITH PROPOFOL N/A 07/25/2016   Procedure: COLONOSCOPY WITH PROPOFOL;  Surgeon: Danie Binder, MD;  [facility];  Service: Endoscopy;  Laterality: N/A;  8:15 AM  . EYE SURGERY    . LUNG CANCER SURGERY Right 05/23/2012   reportedly clear  . TONSILLECTOMY    . TOTAL SHOULDER ARTHROPLASTY Left 08/27/2018   Procedure: LEFT TOTAL SHOULDER ARTHROPLASTY;  Surgeon: Meredith Pel, MD;  Location: [facility];  Service: Orthopedics;  Laterality: Left;  . TOTAL SHOULDER ARTHROPLASTY Right 04/15/2019   Procedure: RIGHT TOTAL SHOULDER ARTHROPLASTY;  Surgeon: Meredith Pel, MD;  Location: [facility];  Service: Orthopedics;  Laterality: Right;  . TUBAL LIGATION     Social History   Occupational History  . Occupation: retired    Comment: Location manager  Tobacco Use  . Smoking status: Current Every Day Smoker    Packs/day: 0.50    Years: 0.00    Pack years: 0.00    Types: Cigarettes  . Smokeless tobacco: Never Used  Substance and Sexual Activity  . Alcohol use: Yes    Comment: occasionally   . Drug use: Yes    Types: Marijuana    Comment: smoking daily  . Sexual activity: Never

## 2019-05-19 ENCOUNTER — Other Ambulatory Visit: Payer: Self-pay

## 2019-05-19 ENCOUNTER — Ambulatory Visit (HOSPITAL_COMMUNITY)
Admission: RE | Admit: 2019-05-19 | Discharge: 2019-05-19 | Disposition: A | Discharge status: Home / Self Care | Payer: Medicare Other | Source: Ambulatory Visit | Attending: Orthopedic Surgery | Admitting: Orthopedic Surgery

## 2019-05-19 DIAGNOSIS — Z96611 Presence of right artificial shoulder joint: Secondary | ICD-10-CM

## 2019-05-19 DIAGNOSIS — M25511 Pain in right shoulder: Secondary | ICD-10-CM | POA: Diagnosis not present

## 2019-05-29 ENCOUNTER — Ambulatory Visit: Payer: Medicare Other | Admitting: Orthopedic Surgery

## 2019-05-30 ENCOUNTER — Ambulatory Visit (INDEPENDENT_AMBULATORY_CARE_PROVIDER_SITE_OTHER): Payer: Medicare Other | Admitting: Orthopedic Surgery

## 2019-05-30 ENCOUNTER — Other Ambulatory Visit: Payer: Self-pay

## 2019-05-30 ENCOUNTER — Encounter: Payer: Self-pay | Admitting: Orthopedic Surgery

## 2019-05-30 ENCOUNTER — Telehealth (HOSPITAL_COMMUNITY): Payer: Self-pay | Admitting: Specialist

## 2019-05-30 VITALS — Ht 63.5 in | Wt 149.0 lb

## 2019-05-30 DIAGNOSIS — Z96611 Presence of right artificial shoulder joint: Secondary | ICD-10-CM

## 2019-05-30 MED ORDER — METHOCARBAMOL 750 MG PO TABS: ORAL_TABLET | ORAL | 0 refills | Status: DC

## 2019-05-30 MED ORDER — HYDROCODONE-ACETAMINOPHEN 10-325 MG PO TABS: 1.0000 | ORAL_TABLET | Freq: Two times a day (BID) | ORAL | 0 refills | Status: DC | PRN

## 2019-05-30 NOTE — Telephone Encounter (Signed)
Pt came by with PT referral for Shoulder ROM-sent OT referral request to Dr Marlou Sa. Waiting on signed order to triage and then get patient scheduled. NF 05/30/2019

## 2019-05-30 NOTE — Addendum Note (Signed)
Addended by: Marcene Duos on: 05/30/2019 09:09 PM   Modules accepted: Orders

## 2019-05-30 NOTE — Progress Notes (Signed)
Post-Op Visit Note   Patient: Deborah Mullins           Date of Birth: [birth year]           MRN: [id number] Visit Date: 05/30/2019 PCP: Susy Frizzle, MD   Assessment & Plan:  Chief Complaint:  Chief Complaint  Patient presents with  . Right Shoulder - Follow-up    CT review   Visit Diagnoses:  1. S/P reverse total shoulder arthroplasty, right     Plan: Deborah Mullins is a 68 year old patient is now about 6 weeks out right reverse shoulder replacement.  CT scan was negative for any type of fracture.  In general she is doing better.  Rena refill her Robaxin and Norco.  Continue with CPM machine.  Forward flexion is 90 abduction is 85.  Come back in 6 weeks.  She is having a little bit of hand weakness which will need to recheck on the return visit as well.  I think that may be related to the block that we will have to see.  Follow-Up Instructions: No follow-ups on file.   Orders:  No orders of the defined types were placed in this encounter.  No orders of the defined types were placed in this encounter.   Imaging: No results found.  PMFS History: Patient Active Problem List   Diagnosis Date Noted  . OA (osteoarthritis) of shoulder 04/15/2019  . Shoulder arthritis 08/27/2018  . Primary osteoarthritis, left shoulder 04/01/2018  . Primary osteoarthritis, right shoulder 04/01/2018  . Essential hypertension 01/22/2017  . Controlled type 2 diabetes mellitus without complication, without long-term current use of insulin ([place]) 11/23/2016  . Chronic pain of both shoulders 07/24/2016  . Prediabetes   . Anxiety   . Bipolar 1 disorder ([place])   . Depression   . Chronic back pain 11/07/2012  . Insomnia secondary to depression with anxiety 09/23/2012  . Unspecified vitamin D deficiency 09/23/2012  . COPD, mild ([place]) 07/01/2012  . Acute bronchitis 06/08/2012  . Adenocarcinoma of lung, stage 1 ([place]) 05/23/2012  . RECTAL BLEEDING 04/27/2010  . HEMORRHOIDS 03/11/2009  .  ADENOCARCINOMA, BREAST 12/15/2008  . FIBROIDS, UTERUS 12/15/2008  . HYPERCHOLESTEROLEMIA 12/15/2008  . Hyperlipidemia 12/15/2008  . Anxiety and depression 12/15/2008  . GASTROESOPHAGEAL REFLUX DISEASE 12/15/2008  . GASTRITIS 12/15/2008  . CONSTIPATION 12/15/2008  . FLATULENCE ERUCTATION AND GAS PAIN 12/15/2008  . ABDOMINAL PAIN 12/15/2008  . RECTAL BLEEDING, HX OF 12/15/2008   Past Medical History:  Diagnosis Date  . Allergic rhinitis   . Anxiety   . Arthritis   . Bipolar 1 disorder ([place])   . Breast cancer ([place]) 2004  . Carpal tunnel syndrome of left wrist   . Chronic neck pain   . Chronic shoulder pain    L>R  . Depression   . GERD (gastroesophageal reflux disease)   . Headache(784.0)   . History of lung surgery   . Hyperlipidemia   . Hypertension   . Lung cancer ([place])   . Lung nodule 2009  . Osteoarthritis of right shoulder   . Pain in both feet   . Personal history of radiation therapy 2004  . PONV (postoperative nausea and vomiting)   . Type 2 diabetes mellitus ([facility])     Family History  Problem Relation Age of Onset  . Heart disease Mother        CHF  . Physical abuse Mother   . Depression Mother   . Cancer Sister   . ADD /  ADHD Sister   . Alcohol abuse Father   . Alcohol abuse Brother   . Mental illness Brother   . Cancer Sister   . Heart disease Sister   . Physical abuse Sister   . OCD Sister   . Mental illness Sister   . Anxiety disorder Neg Hx   . Bipolar disorder Neg Hx   . Dementia Neg Hx   . Drug abuse Neg Hx   . Paranoid behavior Neg Hx   . Schizophrenia Neg Hx   . Seizures Neg Hx   . Sexual abuse Neg Hx     Past Surgical History:  Procedure Laterality Date  . BREAST SURGERY  2005   lumpectomy - left  . carpel tunnel Bilateral   . CESAREAN SECTION  1971  . COLONOSCOPY WITH PROPOFOL N/A 07/25/2016   Procedure: COLONOSCOPY WITH PROPOFOL;  Surgeon: Danie Binder, MD;  [facility];  Service: Endoscopy;  Laterality: N/A;  8:15  AM  . EYE SURGERY    . LUNG CANCER SURGERY Right 05/23/2012   reportedly clear  . TONSILLECTOMY    . TOTAL SHOULDER ARTHROPLASTY Left 08/27/2018   Procedure: LEFT TOTAL SHOULDER ARTHROPLASTY;  Surgeon: Meredith Pel, MD;  Location: [facility];  Service: Orthopedics;  Laterality: Left;  . TOTAL SHOULDER ARTHROPLASTY Right 04/15/2019   Procedure: RIGHT TOTAL SHOULDER ARTHROPLASTY;  Surgeon: Meredith Pel, MD;  Location: [facility];  Service: Orthopedics;  Laterality: Right;  . TUBAL LIGATION     Social History   Occupational History  . Occupation: retired    Comment: Location manager  Tobacco Use  . Smoking status: Current Every Day Smoker    Packs/day: 0.50    Years: 0.00    Pack years: 0.00    Types: Cigarettes  . Smokeless tobacco: Never Used  Substance and Sexual Activity  . Alcohol use: Yes    Comment: occasionally   . Drug use: Yes    Types: Marijuana    Comment: smoking daily  . Sexual activity: Never

## 2019-06-17 ENCOUNTER — Ambulatory Visit (HOSPITAL_COMMUNITY): Payer: Medicare Other | Admitting: Specialist

## 2019-06-17 ENCOUNTER — Encounter (HOSPITAL_COMMUNITY): Payer: Self-pay

## 2019-06-18 DIAGNOSIS — Z23 Encounter for immunization: Secondary | ICD-10-CM | POA: Diagnosis not present

## 2019-06-19 DIAGNOSIS — L723 Sebaceous cyst: Secondary | ICD-10-CM | POA: Diagnosis not present

## 2019-06-19 DIAGNOSIS — L280 Lichen simplex chronicus: Secondary | ICD-10-CM | POA: Diagnosis not present

## 2019-06-24 ENCOUNTER — Other Ambulatory Visit: Payer: Self-pay

## 2019-06-24 ENCOUNTER — Ambulatory Visit (HOSPITAL_COMMUNITY): Payer: Medicare Other | Attending: Orthopedic Surgery | Admitting: Specialist

## 2019-06-24 DIAGNOSIS — M25511 Pain in right shoulder: Secondary | ICD-10-CM | POA: Diagnosis present

## 2019-06-24 DIAGNOSIS — M25611 Stiffness of right shoulder, not elsewhere classified: Secondary | ICD-10-CM | POA: Insufficient documentation

## 2019-06-24 DIAGNOSIS — R29898 Other symptoms and signs involving the musculoskeletal system: Secondary | ICD-10-CM | POA: Diagnosis present

## 2019-06-24 NOTE — Patient Instructions (Signed)
SHOULDER: Flexion On Table   Place hands on towel placed on table, elbows straight. Lean forward with you upper body, pushing towel away from body.  ___ reps per set, ___ sets per day  Abduction (Passive)   With arm out to side, resting on towel placed on table with palm DOWN, keeping trunk away from table, lean to the side while pushing towel away from body.  Repeat ____ times. Do ____ sessions per day.  Copyright  VHI. All rights reserved.     Internal Rotation (Assistive)   Seated with elbow bent at right angle and held against side, slide arm on table surface in an inward arc keeping elbow anchored in place. Repeat ____ times. Do ____ sessions per day. Activity: Use this motion to brush crumbs off the table.  Copyright  VHI. All rights reserved.

## 2019-06-24 NOTE — Therapy (Signed)
[place], [name], [address] Phone: [phone]   Fax:  [phone]  Occupational Therapy Evaluation  Patient Details  Name: Deborah Mullins MRN: [id number] Date of Birth: [birth year] Referring Provider (OT): Dr. Alphonzo Severance    Encounter Date: 06/24/2019  OT End of Session - 06/24/19 1534    Visit Number  1    Number of Visits  16    Date for OT Re-Evaluation  08/23/19   mini reassess on 07/22/19   Authorization Type  Medicare and [employer]    Authorization Time Period  10th visit progress note due on visit 10    Authorization - Visit Number  1    Authorization - Number of Visits  10    OT Start Time  1430    OT Stop Time  1510    OT Time Calculation (min)  40 min    Activity Tolerance  Patient tolerated treatment well    Behavior During Therapy  [facility] for tasks assessed/performed       Past Medical History:  Diagnosis Date  . Allergic rhinitis   . Anxiety   . Arthritis   . Bipolar 1 disorder ([place])   . Breast cancer ([place]) 2004  . Carpal tunnel syndrome of left wrist   . Chronic neck pain   . Chronic shoulder pain    L>R  . Depression   . GERD (gastroesophageal reflux disease)   . Headache(784.0)   . History of lung surgery   . Hyperlipidemia   . Hypertension   . Lung cancer ([place])   . Lung nodule 2009  . Osteoarthritis of right shoulder   . Pain in both feet   . Personal history of radiation therapy 2004  . PONV (postoperative nausea and vomiting)   . Type 2 diabetes mellitus ([place])     Past Surgical History:  Procedure Laterality Date  . BREAST SURGERY  2005   lumpectomy - left  . carpel tunnel Bilateral   . CESAREAN SECTION  1971  . COLONOSCOPY WITH PROPOFOL N/A 07/25/2016   Procedure: COLONOSCOPY WITH PROPOFOL;  Surgeon: Danie Binder, MD;  [facility];  Service: Endoscopy;  Laterality: N/A;  8:15 AM  . EYE SURGERY    . LUNG CANCER SURGERY Right 05/23/2012   reportedly clear  .  TONSILLECTOMY    . TOTAL SHOULDER ARTHROPLASTY Left 08/27/2018   Procedure: LEFT TOTAL SHOULDER ARTHROPLASTY;  Surgeon: Meredith Pel, MD;  Location: [place];  Service: Orthopedics;  Laterality: Left;  . TOTAL SHOULDER ARTHROPLASTY Right 04/15/2019   Procedure: RIGHT TOTAL SHOULDER ARTHROPLASTY;  Surgeon: Meredith Pel, MD;  Location: [place];  Service: Orthopedics;  Laterality: Right;  . TUBAL LIGATION      There were no vitals filed for this visit.  Subjective Assessment - 06/24/19 1523    Subjective   S:  I want to be able to do everything I can with my arm.    Pertinent History  Mrs. Suppa presents today s/p right reverse total shoulder replacement on 04/15/19.  Patient also had a left reverse total shoulder replacement in January of 2020.    Special Tests  FOTO:  55% independent    Patient Stated Goals  return to prior level of function    Currently in Pain?  Yes    Pain Score  8     Pain Location  Shoulder    Pain Orientation  Right  Pain Descriptors / Indicators  Aching    Pain Type  Acute pain    Pain Radiating Towards  upper arm and trapezius region    Pain Onset  More than a month ago    Pain Frequency  Constant    Aggravating Factors   right arm use    Pain Relieving Factors  rest and medication    Effect of Pain on Daily Activities  moderate        [facility] OT Assessment - 06/24/19 0001      Assessment   Medical Diagnosis  S/P Right Reverse Total Shoulder Replacement    Referring Provider (OT)  Dr. Alphonzo Severance     Onset Date/Surgical Date  04/15/19    Hand Dominance  Right      Precautions   Precautions  Shoulder    Type of Shoulder Precautions  progress as tolerated       Restrictions   Weight Bearing Restrictions  No      Balance Screen   Has the patient fallen in the past 6 months  No    Has the patient had a decrease in activity level because of a fear of falling?   No    Is the patient reluctant to leave their home because of a fear of falling?    No      Home  Environment   Family/patient expects to be discharged to:  Private residence    Living Arrangements  Spouse/significant other    Available Help at Discharge  Family    Type of [place]   Level of [place]  Retired    Leisure  enjoys swimming in her pool, drawing, and playing cards       ADL   ADL comments  caregiver for husband, unable to reach above shoulder height, reach out to the side, reach behind her back or comb her hair.  lifting heavy items when cooking, cleaning,       Written Expression   Dominant Hand  Right      Vision - History   Baseline Vision  No visual deficits      Cognition   Overall Cognitive Status  Within Functional Limits for tasks assessed      Observation/Other Assessments   Focus on Therapeutic Outcomes (FOTO)   55% independent       Sensation   Light Touch  Appears Intact      Coordination   Gross Motor Movements are Fluid and Coordinated  Yes    Fine Motor Movements are Fluid and Coordinated  Yes      ROM / Strength   AROM / PROM / Strength  AROM;PROM;Strength      Palpation   Palpation comment  mod-max restrictions in right shoulder region       AROM   Overall AROM Comments  assess in seated position, external and internal rotation with shoulder adducted    AROM Assessment Site  Shoulder    Right/Left Shoulder  Right    Right Shoulder Flexion  92 Degrees    Right Shoulder ABduction  100 Degrees    Right Shoulder Internal Rotation  85 Degrees    Right Shoulder External Rotation  32 Degrees      PROM   Overall PROM Comments  assessed in supine, external and internal rotation with shoulder adducted  PROM Assessment Site  Shoulder    Right/Left Shoulder  Right    Right Shoulder Flexion  110 Degrees    Right Shoulder ABduction  65 Degrees    Right Shoulder Internal Rotation  85 Degrees    Right Shoulder External Rotation  25 Degrees       Strength   Overall Strength Comments  assessed in seated, external and internal rotation with shoulder adducted    Strength Assessment Site  Shoulder    Right/Left Shoulder  Right    Right Shoulder Flexion  4-/5    Right Shoulder ABduction  4-/5    Right Shoulder Internal Rotation  4-/5    Right Shoulder External Rotation  4-/5               OT Treatments/Exercises (OP) - 06/24/19 0001      Manual Therapy   Manual Therapy  Myofascial release    Manual therapy comments  manual therapy completed seperately from all other interventions    Myofascial Release  myofascial release and manual stretching to decrease pain and fascial restrictions and improve pain free mobility in right shoulder region             OT Education - 06/24/19 1534    Education Details  educated patient on HEP for table slides    Person(s) Educated  Patient    Methods  Explanation;Demonstration;Handout    Comprehension  Verbalized understanding;Returned demonstration       OT Short Term Goals - 06/24/19 1545      OT SHORT TERM GOAL #1   Title  patient will be educated and independent with a HEP for improved right shoulder mobility.    Time  4    Period  Weeks    Status  New    Target Date  07/22/19      OT SHORT TERM GOAL #2   Title  Patient will improve right shoulder P/ROM to [facility] for improved ability to don and doff shirts.    Time  4    Period  Weeks    Status  New      OT SHORT TERM GOAL #3   Title  Patient will improve right shoulder strength to 4/5 or stronger for improved ability to lift bags of groceries and laundry baskets.    Time  4    Period  Weeks    Status  New      OT SHORT TERM GOAL #4   Title  Patient will decrease pain in her right shoulder to 5/10 or better during adl completion.    Time  4    Period  Weeks    Status  New      OT SHORT TERM GOAL #5   Title  Patient will decrease right shoulder fascial restrictions from mod to min for improved mobility needed for  adl completion.    Time  4    Period  Weeks    Status  New        OT Long Term Goals - 06/24/19 1548      OT LONG TERM GOAL #1   Title  Patient will return to prior level of independence with all b/iadls and leisure activities, using her right arm as dominant.    Time  8    Period  Weeks    Status  New    Target Date  08/19/19      OT LONG TERM GOAL #2   Title  Patient will imrpove right shoulder a/rom to WNL for improved ability to reach into overhead cabinets, comb her hair, and reach behind her back to pull up pants.    Time  8    Period  Weeks    Status  New      OT LONG TERM GOAL #3   Title  Patient will improve right shoulder strength to 4+/5 or better for improved ability to lift pots and pans when cooking.    Time  8    Period  Weeks    Status  New      OT LONG TERM GOAL #4   Title  Patient will decrease pain in right shoulder to 3/10 or better when completing functional tasks.    Time  8    Period  Weeks    Status  New      OT LONG TERM GOAL #5   Title  Patient will decrease fascial restrictions to min-trace or better for improved mobility needed for adl completion.    Time  8    Period  Weeks    Status  New            Plan - 06/24/19 1536    Clinical Impression Statement  A:  Patient is a 68 year old female with past medical history significant for lung cancer, neck pain, htn, diabetes, and left reverse total shoulder replacement.  Patient had right reverse total shoulder replacement on 04/15/2019, and has been referred to occupational therapy for evaluation and treatment.  Patient is not able to use her dominant right arm for functional activities above waist height without increased pain and difficulty.  Patient will benefit from skilled OT intervention to decrease pain and restrictions and improve pain free mobility in her right shoulder region.    OT Occupational Profile and History  Problem Focused Assessment - Including review of records relating to  presenting problem    Occupational performance deficits (Please refer to evaluation for details):  ADL's;IADL's;Rest and Sleep;Leisure;Social Participation    Body Structure / Function / Physical Skills  ADL;Muscle spasms;Scar mobility;UE functional use;Fascial restriction;Pain;Strength;ROM    Rehab Potential  Good    Clinical Decision Making  Limited treatment options, no task modification necessary    Comorbidities Affecting Occupational Performance:  May have comorbidities impacting occupational performance    Modification or Assistance to Complete Evaluation   No modification of tasks or assist necessary to complete eval    OT Frequency  2x / week    OT Duration  8 weeks    OT Treatment/Interventions  Self-care/ADL training;Cryotherapy;Therapeutic exercise;DME and/or AE instruction;Scar mobilization;Manual Therapy;Neuromuscular education;Ultrasound;Electrical Stimulation;Moist Heat;Energy conservation;Therapeutic activities;Passive range of motion;Patient/family education    Plan  P:  Skilled OT intervention to decrease pain and restrictions and improve pain free mobility and strength in patient's dominant right shoulder for return to prior level of independence with all B/IADLS and leisure activities.  Next session: manual therapy, aa/rom, p/rom, progressing as tolerated. update HEP to aa/rom.    OT Home Exercise Plan  06/24/19:  table slides    Consulted and Agree with Plan of Care  Patient       Patient will benefit from skilled therapeutic intervention in order to improve the following deficits and impairments:   Body Structure / Function / Physical Skills: ADL, Muscle spasms, Scar mobility, UE functional use, Fascial restriction, Pain, Strength, ROM       Visit Diagnosis: Acute pain of right shoulder - Plan: Ot plan of  care cert/re-cert  Other symptoms and signs involving the musculoskeletal system - Plan: Ot plan of care cert/re-cert  Stiffness of right shoulder, not elsewhere  classified - Plan: Ot plan of care cert/re-cert    Problem List Patient Active Problem List   Diagnosis Date Noted  . OA (osteoarthritis) of shoulder 04/15/2019  . Shoulder arthritis 08/27/2018  . Primary osteoarthritis, left shoulder 04/01/2018  . Primary osteoarthritis, right shoulder 04/01/2018  . Essential hypertension 01/22/2017  . Controlled type 2 diabetes mellitus without complication, without long-term current use of insulin ([employer]) 11/23/2016  . Chronic pain of both shoulders 07/24/2016  . Prediabetes   . Anxiety   . Bipolar 1 disorder ([place])   . Depression   . Chronic back pain 11/07/2012  . Insomnia secondary to depression with anxiety 09/23/2012  . Unspecified vitamin D deficiency 09/23/2012  . COPD, mild ([place]) 07/01/2012  . Acute bronchitis 06/08/2012  . Adenocarcinoma of lung, stage 1 ([place]) 05/23/2012  . RECTAL BLEEDING 04/27/2010  . HEMORRHOIDS 03/11/2009  . ADENOCARCINOMA, BREAST 12/15/2008  . FIBROIDS, UTERUS 12/15/2008  . HYPERCHOLESTEROLEMIA 12/15/2008  . Hyperlipidemia 12/15/2008  . Anxiety and depression 12/15/2008  . GASTROESOPHAGEAL REFLUX DISEASE 12/15/2008  . GASTRITIS 12/15/2008  . CONSTIPATION 12/15/2008  . FLATULENCE ERUCTATION AND GAS PAIN 12/15/2008  . ABDOMINAL PAIN 12/15/2008  . RECTAL BLEEDING, HX OF 12/15/2008    Vangie Bicker, [place], OTR/L [phone]  06/24/2019, 4:31 PM  [place] [address] [place], [name], [address] Phone: [phone]   Fax:  [phone]  Name: VICCI REDER MRN: [id number] Date of Birth: 03/13/1951

## 2019-06-27 ENCOUNTER — Encounter (HOSPITAL_COMMUNITY): Payer: Self-pay | Admitting: Occupational Therapy

## 2019-06-27 ENCOUNTER — Ambulatory Visit (HOSPITAL_COMMUNITY): Payer: Medicare Other | Admitting: Occupational Therapy

## 2019-06-27 ENCOUNTER — Other Ambulatory Visit: Payer: Self-pay

## 2019-06-27 DIAGNOSIS — M25511 Pain in right shoulder: Secondary | ICD-10-CM

## 2019-06-27 DIAGNOSIS — R29898 Other symptoms and signs involving the musculoskeletal system: Secondary | ICD-10-CM

## 2019-06-27 DIAGNOSIS — M25611 Stiffness of right shoulder, not elsewhere classified: Secondary | ICD-10-CM

## 2019-06-27 NOTE — Therapy (Signed)
[name], [name], [address] Phone: [phone]   Fax:  [phone]  Occupational Therapy Treatment  Patient Details  Name: Deborah Mullins MRN: [id number] Date of Birth: 14-Aug-1951 Referring Provider (OT): Dr. Alphonzo Severance    Encounter Date: 06/27/2019  OT End of Session - 06/27/19 1513    Visit Number  2    Number of Visits  16    Date for OT Re-Evaluation  08/23/19   mini reassess on 07/22/19   Authorization Type  Medicare and [employer]    Authorization Time Period  10th visit progress note due on visit 10    Authorization - Visit Number  2    Authorization - Number of Visits  10    OT Start Time  1432    OT Stop Time  1515    OT Time Calculation (min)  43 min    Activity Tolerance  Patient tolerated treatment well    Behavior During Therapy  [facility] for tasks assessed/performed       Past Medical History:  Diagnosis Date  . Allergic rhinitis   . Anxiety   . Arthritis   . Bipolar 1 disorder ([place])   . Breast cancer ([place]) 2004  . Carpal tunnel syndrome of left wrist   . Chronic neck pain   . Chronic shoulder pain    L>R  . Depression   . GERD (gastroesophageal reflux disease)   . Headache(784.0)   . History of lung surgery   . Hyperlipidemia   . Hypertension   . Lung cancer ([place])   . Lung nodule 2009  . Osteoarthritis of right shoulder   . Pain in both feet   . Personal history of radiation therapy 2004  . PONV (postoperative nausea and vomiting)   . Type 2 diabetes mellitus ([place])     Past Surgical History:  Procedure Laterality Date  . BREAST SURGERY  2005   lumpectomy - left  . carpel tunnel Bilateral   . CESAREAN SECTION  1971  . COLONOSCOPY WITH PROPOFOL N/A 07/25/2016   Procedure: COLONOSCOPY WITH PROPOFOL;  Surgeon: Danie Binder, MD;  [facility];  Service: Endoscopy;  Laterality: N/A;  8:15 AM  . EYE SURGERY    . LUNG CANCER SURGERY Right 05/23/2012   reportedly clear  .  TONSILLECTOMY    . TOTAL SHOULDER ARTHROPLASTY Left 08/27/2018   Procedure: LEFT TOTAL SHOULDER ARTHROPLASTY;  Surgeon: Meredith Pel, MD;  Location: [facility];  Service: Orthopedics;  Laterality: Left;  . TOTAL SHOULDER ARTHROPLASTY Right 04/15/2019   Procedure: RIGHT TOTAL SHOULDER ARTHROPLASTY;  Surgeon: Meredith Pel, MD;  Location: [place];  Service: Orthopedics;  Laterality: Right;  . TUBAL LIGATION      There were no vitals filed for this visit.  Subjective Assessment - 06/27/19 1436    Subjective   S: I'm more sore than I was last time I was here.    Currently in Pain?  Yes    Pain Score  8     Pain Location  Shoulder    Pain Orientation  Right    Pain Descriptors / Indicators  Sore    Pain Type  Acute pain    Pain Radiating Towards  upper arm and scapular region    Pain Onset  More than a month ago    Pain Frequency  Constant    Aggravating Factors   right arm use    Pain  Relieving Factors  rest and medication    Effect of Pain on Daily Activities  moderate         [facility] OT Assessment - 06/27/19 1436      Assessment   Medical Diagnosis  S/P Right Reverse Total Shoulder Replacement      Precautions   Precautions  Shoulder    Type of Shoulder Precautions  progress as tolerated                OT Treatments/Exercises (OP) - 06/27/19 1438      Exercises   Exercises  Shoulder      Shoulder Exercises: Supine   Protraction  PROM;AAROM;10 reps    Horizontal ABduction  PROM;AAROM;10 reps    External Rotation  PROM;AAROM;10 reps    Internal Rotation  PROM;AAROM;10 reps    Flexion  PROM;AAROM;10 reps    ABduction  PROM;AAROM;10 reps      Shoulder Exercises: Seated   Extension  AROM;10 reps    Row  AROM;10 reps      Manual Therapy   Manual Therapy  Myofascial release    Manual therapy comments  manual therapy completed seperately from all other interventions    Myofascial Release  myofascial release and manual stretching to decrease pain and fascial  restrictions and improve pain free mobility in right shoulder region                OT Short Term Goals - 06/27/19 1511      OT SHORT TERM GOAL #1   Title  patient will be educated and independent with a HEP for improved right shoulder mobility.    Time  4    Period  Weeks    Status  On-going    Target Date  07/22/19      OT SHORT TERM GOAL #2   Title  Patient will improve right shoulder P/ROM to [facility] for improved ability to don and doff shirts.    Time  4    Period  Weeks    Status  On-going      OT SHORT TERM GOAL #3   Title  Patient will improve right shoulder strength to 4/5 or stronger for improved ability to lift bags of groceries and laundry baskets.    Time  4    Period  Weeks    Status  On-going      OT SHORT TERM GOAL #4   Title  Patient will decrease pain in her right shoulder to 5/10 or better during adl completion.    Time  4    Period  Weeks    Status  On-going      OT SHORT TERM GOAL #5   Title  Patient will decrease right shoulder fascial restrictions from mod to min for improved mobility needed for adl completion.    Time  4    Period  Weeks    Status  On-going        OT Long Term Goals - 06/27/19 1511      OT LONG TERM GOAL #1   Title  Patient will return to prior level of independence with all b/iadls and leisure activities, using her right arm as dominant.    Time  8    Period  Weeks    Status  On-going      OT LONG TERM GOAL #2   Title  Patient will imrpove right shoulder a/rom to WNL for improved ability to reach into overhead cabinets,  comb her hair, and reach behind her back to pull up pants.    Time  8    Period  Weeks    Status  On-going      OT LONG TERM GOAL #3   Title  Patient will improve right shoulder strength to 4+/5 or better for improved ability to lift pots and pans when cooking.    Time  8    Period  Weeks    Status  On-going      OT LONG TERM GOAL #4   Title  Patient will decrease pain in right shoulder to 3/10  or better when completing functional tasks.    Time  8    Period  Weeks    Status  On-going      OT LONG TERM GOAL #5   Title  Patient will decrease fascial restrictions to min-trace or better for improved mobility needed for adl completion.    Time  8    Period  Weeks    Status  On-going            Plan - 06/27/19 1511    Clinical Impression Statement  A: Initiated myofascial release and manual techniques, passvie stretching, AA/ROM in supine and scapular A/ROM. Pt requiring verbal and visual cuing for form and technique during exercises. Abduction limited to <50% ROM due to painful catching.    Body Structure / Function / Physical Skills  ADL;Muscle spasms;Scar mobility;UE functional use;Fascial restriction;Pain;Strength;ROM    Plan  P: Continue with AA/ROM working on independence with form.       Patient will benefit from skilled therapeutic intervention in order to improve the following deficits and impairments:   Body Structure / Function / Physical Skills: ADL, Muscle spasms, Scar mobility, UE functional use, Fascial restriction, Pain, Strength, ROM       Visit Diagnosis: Acute pain of right shoulder  Other symptoms and signs involving the musculoskeletal system  Stiffness of right shoulder, not elsewhere classified    Problem List Patient Active Problem List   Diagnosis Date Noted  . OA (osteoarthritis) of shoulder 04/15/2019  . Shoulder arthritis 08/27/2018  . Primary osteoarthritis, left shoulder 04/01/2018  . Primary osteoarthritis, right shoulder 04/01/2018  . Essential hypertension 01/22/2017  . Controlled type 2 diabetes mellitus without complication, without long-term current use of insulin ([place]) 11/23/2016  . Chronic pain of both shoulders 07/24/2016  . Prediabetes   . Anxiety   . Bipolar 1 disorder ([place])   . Depression   . Chronic back pain 11/07/2012  . Insomnia secondary to depression with anxiety 09/23/2012  . Unspecified vitamin D deficiency  09/23/2012  . COPD, mild ([place]) 07/01/2012  . Acute bronchitis 06/08/2012  . Adenocarcinoma of lung, stage 1 ([place]) 05/23/2012  . RECTAL BLEEDING 04/27/2010  . HEMORRHOIDS 03/11/2009  . ADENOCARCINOMA, BREAST 12/15/2008  . FIBROIDS, UTERUS 12/15/2008  . HYPERCHOLESTEROLEMIA 12/15/2008  . Hyperlipidemia 12/15/2008  . Anxiety and depression 12/15/2008  . GASTROESOPHAGEAL REFLUX DISEASE 12/15/2008  . GASTRITIS 12/15/2008  . CONSTIPATION 12/15/2008  . FLATULENCE ERUCTATION AND GAS PAIN 12/15/2008  . ABDOMINAL PAIN 12/15/2008  . RECTAL BLEEDING, HX OF 12/15/2008   Guadelupe Sabin, OTR/L  [phone] 06/27/2019, 5:08 PM  [place] [place] [address] [place], [name], [address] Phone: [phone]   Fax:  [phone]  Name: SORA VROOMAN MRN: [id number] Date of Birth: 03/04/1951

## 2019-06-30 ENCOUNTER — Telehealth (HOSPITAL_COMMUNITY): Payer: Self-pay

## 2019-06-30 ENCOUNTER — Ambulatory Visit (HOSPITAL_COMMUNITY): Payer: Medicare Other

## 2019-06-30 NOTE — Telephone Encounter (Signed)
pt called to cx today's appt due to she had death in the family and had to go out of town.

## 2019-07-02 ENCOUNTER — Encounter (HOSPITAL_COMMUNITY): Payer: Medicare Other

## 2019-07-07 ENCOUNTER — Other Ambulatory Visit: Payer: Self-pay

## 2019-07-07 ENCOUNTER — Encounter (HOSPITAL_COMMUNITY): Payer: Self-pay

## 2019-07-07 ENCOUNTER — Ambulatory Visit (HOSPITAL_COMMUNITY): Payer: Medicare Other

## 2019-07-07 DIAGNOSIS — M25511 Pain in right shoulder: Secondary | ICD-10-CM

## 2019-07-07 DIAGNOSIS — R29898 Other symptoms and signs involving the musculoskeletal system: Secondary | ICD-10-CM

## 2019-07-07 DIAGNOSIS — M25611 Stiffness of right shoulder, not elsewhere classified: Secondary | ICD-10-CM

## 2019-07-07 NOTE — Patient Instructions (Signed)

## 2019-07-07 NOTE — Therapy (Signed)
[place], [name], [address] Phone: [phone]   Fax:  [phone]  Occupational Therapy Treatment  Patient Details  Name: Deborah Mullins MRN: [id number] Date of Birth: 10-13-1950 Referring Provider (OT): Dr. Alphonzo Severance    Encounter Date: 07/07/2019  OT End of Session - 07/07/19 1620    Visit Number  3    Number of Visits  16    Date for OT Re-Evaluation  08/23/19   mini reassess on 07/22/19   Authorization Type  Medicare and [employer]    Authorization Time Period  10th visit progress note due on visit 10    Authorization - Visit Number  3    Authorization - Number of Visits  10    OT Start Time  1600    OT Stop Time  1641    OT Time Calculation (min)  41 min    Activity Tolerance  Patient tolerated treatment well    Behavior During Therapy  [facility] for tasks assessed/performed       Past Medical History:  Diagnosis Date  . Allergic rhinitis   . Anxiety   . Arthritis   . Bipolar 1 disorder ([place])   . Breast cancer ([place]) 2004  . Carpal tunnel syndrome of left wrist   . Chronic neck pain   . Chronic shoulder pain    L>R  . Depression   . GERD (gastroesophageal reflux disease)   . Headache(784.0)   . History of lung surgery   . Hyperlipidemia   . Hypertension   . Lung cancer ([place])   . Lung nodule 2009  . Osteoarthritis of right shoulder   . Pain in both feet   . Personal history of radiation therapy 2004  . PONV (postoperative nausea and vomiting)   . Type 2 diabetes mellitus ([place])     Past Surgical History:  Procedure Laterality Date  . BREAST SURGERY  2005   lumpectomy - left  . carpel tunnel Bilateral   . CESAREAN SECTION  1971  . COLONOSCOPY WITH PROPOFOL N/A 07/25/2016   Procedure: COLONOSCOPY WITH PROPOFOL;  Surgeon: Danie Binder, MD;  [facility];  Service: Endoscopy;  Laterality: N/A;  8:15 AM  . EYE SURGERY    . LUNG CANCER SURGERY Right 05/23/2012   reportedly clear  .  TONSILLECTOMY    . TOTAL SHOULDER ARTHROPLASTY Left 08/27/2018   Procedure: LEFT TOTAL SHOULDER ARTHROPLASTY;  Surgeon: Meredith Pel, MD;  Location: [place];  Service: Orthopedics;  Laterality: Left;  . TOTAL SHOULDER ARTHROPLASTY Right 04/15/2019   Procedure: RIGHT TOTAL SHOULDER ARTHROPLASTY;  Surgeon: Meredith Pel, MD;  Location: [place];  Service: Orthopedics;  Laterality: Right;  . TUBAL LIGATION      There were no vitals filed for this visit.  Subjective Assessment - 07/07/19 1603    Subjective   S: It hurts me more at night.    Currently in Pain?  No/denies         [facility] OT Assessment - 07/07/19 1613      Assessment   Medical Diagnosis  S/P Right Reverse Total Shoulder Replacement      Precautions   Precautions  Shoulder    Type of Shoulder Precautions  progress as tolerated                OT Treatments/Exercises (OP) - 07/07/19 1613      Exercises   Exercises  Shoulder  Shoulder Exercises: Supine   Protraction  PROM;5 reps;AAROM;12 reps    Horizontal ABduction  PROM;5 reps;AAROM;12 reps    External Rotation  PROM;5 reps;AAROM;12 reps    Internal Rotation  PROM;5 reps;AAROM;12 reps    Flexion  PROM;5 reps;AAROM;12 reps    ABduction  PROM;5 reps;AAROM;12 reps      Shoulder Exercises: Standing   Protraction  AAROM;10 reps    Horizontal ABduction  AAROM;10 reps    External Rotation  AAROM;10 reps    Internal Rotation  AAROM;10 reps    Flexion  AAROM;10 reps    ABduction  AAROM;10 reps      Manual Therapy   Manual Therapy  Myofascial release    Manual therapy comments  manual therapy completed seperately from all other interventions    Myofascial Release  myofascial release and manual stretching to decrease pain and fascial restrictions and improve pain free mobility in right shoulder region              OT Education - 07/07/19 1632    Education Details  AA/ROM shoulder exercises; standing. Pt may stop table slides.    Person(s)  Educated  Patient    Methods  Explanation;Demonstration;Handout;Verbal cues    Comprehension  Returned demonstration;Verbalized understanding       OT Short Term Goals - 06/27/19 1511      OT SHORT TERM GOAL #1   Title  patient will be educated and independent with a HEP for improved right shoulder mobility.    Time  4    Period  Weeks    Status  On-going    Target Date  07/22/19      OT SHORT TERM GOAL #2   Title  Patient will improve right shoulder P/ROM to [facility] for improved ability to don and doff shirts.    Time  4    Period  Weeks    Status  On-going      OT SHORT TERM GOAL #3   Title  Patient will improve right shoulder strength to 4/5 or stronger for improved ability to lift bags of groceries and laundry baskets.    Time  4    Period  Weeks    Status  On-going      OT SHORT TERM GOAL #4   Title  Patient will decrease pain in her right shoulder to 5/10 or better during adl completion.    Time  4    Period  Weeks    Status  On-going      OT SHORT TERM GOAL #5   Title  Patient will decrease right shoulder fascial restrictions from mod to min for improved mobility needed for adl completion.    Time  4    Period  Weeks    Status  On-going        OT Long Term Goals - 06/27/19 1511      OT LONG TERM GOAL #1   Title  Patient will return to prior level of independence with all b/iadls and leisure activities, using her right arm as dominant.    Time  8    Period  Weeks    Status  On-going      OT LONG TERM GOAL #2   Title  Patient will imrpove right shoulder a/rom to WNL for improved ability to reach into overhead cabinets, comb her hair, and reach behind her back to pull up pants.    Time  8    Period  Weeks  Status  On-going      OT LONG TERM GOAL #3   Title  Patient will improve right shoulder strength to 4+/5 or better for improved ability to lift pots and pans when cooking.    Time  8    Period  Weeks    Status  On-going      OT LONG TERM GOAL #4    Title  Patient will decrease pain in right shoulder to 3/10 or better when completing functional tasks.    Time  8    Period  Weeks    Status  On-going      OT LONG TERM GOAL #5   Title  Patient will decrease fascial restrictions to min-trace or better for improved mobility needed for adl completion.    Time  8    Period  Weeks    Status  On-going            Plan - 07/07/19 1641    Clinical Impression Statement  A: Pt completed AA/ROM supine and standing this session. HEP was updated to progress. VC for form and technique were provided. Manual techniques completed to address fascial restrictions. Pt with max guarding during passive stretching which made it difficult to fully stretch to max potential although patient was able to stretch her arm farther when completing AA/ROM.    Body Structure / Function / Physical Skills  ADL;Muscle spasms;Scar mobility;UE functional use;Fascial restriction;Pain;Strength;ROM    Plan  P: Follow up on HEP. Add pulleys. Wall wash.    Consulted and Agree with Plan of Care  Patient       Patient will benefit from skilled therapeutic intervention in order to improve the following deficits and impairments:   Body Structure / Function / Physical Skills: ADL, Muscle spasms, Scar mobility, UE functional use, Fascial restriction, Pain, Strength, ROM       Visit Diagnosis: Stiffness of right shoulder, not elsewhere classified  Other symptoms and signs involving the musculoskeletal system  Acute pain of right shoulder    Problem List Patient Active Problem List   Diagnosis Date Noted  . OA (osteoarthritis) of shoulder 04/15/2019  . Shoulder arthritis 08/27/2018  . Primary osteoarthritis, left shoulder 04/01/2018  . Primary osteoarthritis, right shoulder 04/01/2018  . Essential hypertension 01/22/2017  . Controlled type 2 diabetes mellitus without complication, without long-term current use of insulin ([place]) 11/23/2016  . Chronic pain of both  shoulders 07/24/2016  . Prediabetes   . Anxiety   . Bipolar 1 disorder ([place])   . Depression   . Chronic back pain 11/07/2012  . Insomnia secondary to depression with anxiety 09/23/2012  . Unspecified vitamin D deficiency 09/23/2012  . COPD, mild ([place]) 07/01/2012  . Acute bronchitis 06/08/2012  . Adenocarcinoma of lung, stage 1 ([place]) 05/23/2012  . RECTAL BLEEDING 04/27/2010  . HEMORRHOIDS 03/11/2009  . ADENOCARCINOMA, BREAST 12/15/2008  . FIBROIDS, UTERUS 12/15/2008  . HYPERCHOLESTEROLEMIA 12/15/2008  . Hyperlipidemia 12/15/2008  . Anxiety and depression 12/15/2008  . GASTROESOPHAGEAL REFLUX DISEASE 12/15/2008  . GASTRITIS 12/15/2008  . CONSTIPATION 12/15/2008  . FLATULENCE ERUCTATION AND GAS PAIN 12/15/2008  . ABDOMINAL PAIN 12/15/2008  . RECTAL BLEEDING, HX OF 12/15/2008   Ailene Ravel, OTR/L,CBIS  [phone]  07/07/2019, 4:56 PM  [facility] [address] [place], [name], [address] Phone: [phone]   Fax:  [phone]  Name: CHRISHA VOGEL MRN: [id number] Date of Birth: Aug 09, 1951

## 2019-07-09 ENCOUNTER — Encounter (HOSPITAL_COMMUNITY): Payer: Self-pay

## 2019-07-09 ENCOUNTER — Ambulatory Visit (HOSPITAL_COMMUNITY): Payer: Medicare Other

## 2019-07-09 ENCOUNTER — Ambulatory Visit (INDEPENDENT_AMBULATORY_CARE_PROVIDER_SITE_OTHER): Payer: Medicare Other | Admitting: Orthopedic Surgery

## 2019-07-09 ENCOUNTER — Other Ambulatory Visit: Payer: Self-pay

## 2019-07-09 ENCOUNTER — Encounter: Payer: Self-pay | Admitting: Orthopedic Surgery

## 2019-07-09 DIAGNOSIS — M25611 Stiffness of right shoulder, not elsewhere classified: Secondary | ICD-10-CM

## 2019-07-09 DIAGNOSIS — Z96611 Presence of right artificial shoulder joint: Secondary | ICD-10-CM

## 2019-07-09 DIAGNOSIS — E785 Hyperlipidemia, unspecified: Secondary | ICD-10-CM | POA: Diagnosis not present

## 2019-07-09 DIAGNOSIS — R29898 Other symptoms and signs involving the musculoskeletal system: Secondary | ICD-10-CM

## 2019-07-09 DIAGNOSIS — I10 Essential (primary) hypertension: Secondary | ICD-10-CM | POA: Diagnosis not present

## 2019-07-09 DIAGNOSIS — E1140 Type 2 diabetes mellitus with diabetic neuropathy, unspecified: Secondary | ICD-10-CM | POA: Diagnosis not present

## 2019-07-09 DIAGNOSIS — M25511 Pain in right shoulder: Secondary | ICD-10-CM | POA: Diagnosis not present

## 2019-07-09 MED ORDER — HYDROCODONE-ACETAMINOPHEN 5-325 MG PO TABS: 1.0000 | ORAL_TABLET | Freq: Two times a day (BID) | ORAL | 0 refills | Status: DC | PRN

## 2019-07-09 MED ORDER — METHOCARBAMOL 750 MG PO TABS: ORAL_TABLET | ORAL | 0 refills | Status: DC

## 2019-07-09 NOTE — Therapy (Signed)
[place], [name], [address] Phone: [phone]   Fax:  [phone]  Occupational Therapy Treatment  Patient Details  Name: Deborah Mullins MRN: [id number] Date of Birth: 08/04/1951 Referring Provider (OT): Dr. Alphonzo Severance    Encounter Date: 07/09/2019  OT End of Session - 07/09/19 1737    Visit Number  4    Number of Visits  16    Date for OT Re-Evaluation  08/23/19   mini reassess on 07/22/19   Authorization Type  Medicare and [employer]    Authorization Time Period  10th visit progress note due on visit 10    Authorization - Visit Number  4    Authorization - Number of Visits  10    OT Start Time  1645    OT Stop Time  1723    OT Time Calculation (min)  38 min    Activity Tolerance  Patient tolerated treatment well    Behavior During Therapy  [facility] for tasks assessed/performed       Past Medical History:  Diagnosis Date  . Allergic rhinitis   . Anxiety   . Arthritis   . Bipolar 1 disorder ([place])   . Breast cancer ([place]) 2004  . Carpal tunnel syndrome of left wrist   . Chronic neck pain   . Chronic shoulder pain    L>R  . Depression   . GERD (gastroesophageal reflux disease)   . Headache(784.0)   . History of lung surgery   . Hyperlipidemia   . Hypertension   . Lung cancer ([place])   . Lung nodule 2009  . Osteoarthritis of right shoulder   . Pain in both feet   . Personal history of radiation therapy 2004  . PONV (postoperative nausea and vomiting)   . Type 2 diabetes mellitus ([place])     Past Surgical History:  Procedure Laterality Date  . BREAST SURGERY  2005   lumpectomy - left  . carpel tunnel Bilateral   . CESAREAN SECTION  1971  . COLONOSCOPY WITH PROPOFOL N/A 07/25/2016   Procedure: COLONOSCOPY WITH PROPOFOL;  Surgeon: Danie Binder, MD;  [facility];  Service: Endoscopy;  Laterality: N/A;  8:15 AM  . EYE SURGERY    . LUNG CANCER SURGERY Right 05/23/2012   reportedly clear  .  TONSILLECTOMY    . TOTAL SHOULDER ARTHROPLASTY Left 08/27/2018   Procedure: LEFT TOTAL SHOULDER ARTHROPLASTY;  Surgeon: Meredith Pel, MD;  Location: [place];  Service: Orthopedics;  Laterality: Left;  . TOTAL SHOULDER ARTHROPLASTY Right 04/15/2019   Procedure: RIGHT TOTAL SHOULDER ARTHROPLASTY;  Surgeon: Meredith Pel, MD;  Location: [place];  Service: Orthopedics;  Laterality: Right;  . TUBAL LIGATION      There were no vitals filed for this visit.  Subjective Assessment - 07/09/19 1719    Subjective   S: My Doctor's appointment went well.    Currently in Pain?  No/denies         [facility] OT Assessment - 07/09/19 1720      Assessment   Medical Diagnosis  S/P Right Reverse Total Shoulder Replacement    Referring Provider (OT)  Dr. Alphonzo Severance       Precautions   Precautions  Shoulder    Type of Shoulder Precautions  progress as tolerated                OT Treatments/Exercises (OP) - 07/09/19 1720  Exercises   Exercises  Shoulder      Shoulder Exercises: Supine   Protraction  PROM;5 reps;AAROM;12 reps    Horizontal ABduction  PROM;5 reps;AAROM;12 reps    External Rotation  PROM;5 reps;AAROM;12 reps    Internal Rotation  PROM;5 reps;AAROM;12 reps    Flexion  PROM;5 reps;AAROM;12 reps      Shoulder Exercises: Standing   External Rotation  AAROM;10 reps    ABduction  AAROM;10 reps      Shoulder Exercises: Pulleys   Flexion  1 minute   seated   ABduction  1 minute   seated     Shoulder Exercises: ROM/Strengthening   Wall Wash  1'               OT Short Term Goals - 06/27/19 1511      OT SHORT TERM GOAL #1   Title  patient will be educated and independent with a HEP for improved right shoulder mobility.    Time  4    Period  Weeks    Status  On-going    Target Date  07/22/19      OT SHORT TERM GOAL #2   Title  Patient will improve right shoulder P/ROM to [facility] for improved ability to don and doff shirts.    Time  4    Period  Weeks     Status  On-going      OT SHORT TERM GOAL #3   Title  Patient will improve right shoulder strength to 4/5 or stronger for improved ability to lift bags of groceries and laundry baskets.    Time  4    Period  Weeks    Status  On-going      OT SHORT TERM GOAL #4   Title  Patient will decrease pain in her right shoulder to 5/10 or better during adl completion.    Time  4    Period  Weeks    Status  On-going      OT SHORT TERM GOAL #5   Title  Patient will decrease right shoulder fascial restrictions from mod to min for improved mobility needed for adl completion.    Time  4    Period  Weeks    Status  On-going        OT Long Term Goals - 06/27/19 1511      OT LONG TERM GOAL #1   Title  Patient will return to prior level of independence with all b/iadls and leisure activities, using her right arm as dominant.    Time  8    Period  Weeks    Status  On-going      OT LONG TERM GOAL #2   Title  Patient will imrpove right shoulder a/rom to WNL for improved ability to reach into overhead cabinets, comb her hair, and reach behind her back to pull up pants.    Time  8    Period  Weeks    Status  On-going      OT LONG TERM GOAL #3   Title  Patient will improve right shoulder strength to 4+/5 or better for improved ability to lift pots and pans when cooking.    Time  8    Period  Weeks    Status  On-going      OT LONG TERM GOAL #4   Title  Patient will decrease pain in right shoulder to 3/10 or better when completing functional tasks.  Time  8    Period  Weeks    Status  On-going      OT LONG TERM GOAL #5   Title  Patient will decrease fascial restrictions to min-trace or better for improved mobility needed for adl completion.    Time  8    Period  Weeks    Status  On-going            Plan - 07/09/19 1737    Clinical Impression Statement  A: Continued with AA/ROM and passive stretching. patient continues to be muscle guarding during passive stretching although was  able to relax slightly. Small pop felt during passive abduction with slight increase in ROM. Added wall wash and pulleys. VC for form and technique were needed during session. Manual techniques were completed to address fascial restrictions.    Body Structure / Function / Physical Skills  ADL;Muscle spasms;Scar mobility;UE functional use;Fascial restriction;Pain;Strength;ROM    Plan  P: Continue with AA/ROM. Add PVC pipe. Attempt standing for abduction with pulleys and sitting for flexion.    Consulted and Agree with Plan of Care  Patient       Patient will benefit from skilled therapeutic intervention in order to improve the following deficits and impairments:   Body Structure / Function / Physical Skills: ADL, Muscle spasms, Scar mobility, UE functional use, Fascial restriction, Pain, Strength, ROM       Visit Diagnosis: Acute pain of right shoulder  Other symptoms and signs involving the musculoskeletal system  Stiffness of right shoulder, not elsewhere classified    Problem List Patient Active Problem List   Diagnosis Date Noted  . OA (osteoarthritis) of shoulder 04/15/2019  . Shoulder arthritis 08/27/2018  . Primary osteoarthritis, left shoulder 04/01/2018  . Primary osteoarthritis, right shoulder 04/01/2018  . Essential hypertension 01/22/2017  . Controlled type 2 diabetes mellitus without complication, without long-term current use of insulin ([place]) 11/23/2016  . Chronic pain of both shoulders 07/24/2016  . Prediabetes   . Anxiety   . Bipolar 1 disorder ([place])   . Depression   . Chronic back pain 11/07/2012  . Insomnia secondary to depression with anxiety 09/23/2012  . Unspecified vitamin D deficiency 09/23/2012  . COPD, mild ([place]) 07/01/2012  . Acute bronchitis 06/08/2012  . Adenocarcinoma of lung, stage 1 ([place]) 05/23/2012  . RECTAL BLEEDING 04/27/2010  . HEMORRHOIDS 03/11/2009  . ADENOCARCINOMA, BREAST 12/15/2008  . FIBROIDS, UTERUS 12/15/2008  .  HYPERCHOLESTEROLEMIA 12/15/2008  . Hyperlipidemia 12/15/2008  . Anxiety and depression 12/15/2008  . GASTROESOPHAGEAL REFLUX DISEASE 12/15/2008  . GASTRITIS 12/15/2008  . CONSTIPATION 12/15/2008  . FLATULENCE ERUCTATION AND GAS PAIN 12/15/2008  . ABDOMINAL PAIN 12/15/2008  . RECTAL BLEEDING, HX OF 12/15/2008   Ailene Ravel, OTR/L,CBIS  [phone]  07/09/2019, 5:41 PM  [facility] [address] [place], [name], [address] Phone: [phone]   Fax:  [phone]  Name: Deborah Mullins MRN: [id number] Date of Birth: July 26, 1951

## 2019-07-13 ENCOUNTER — Encounter: Payer: Self-pay | Admitting: Orthopedic Surgery

## 2019-07-13 NOTE — Progress Notes (Signed)
Post-Op Visit Note   Patient: Deborah Mullins           Date of Birth: Dec 27, 1950           MRN: [id number] Visit Date: 07/09/2019 PCP: Susy Frizzle, MD   Assessment & Plan:  Chief Complaint:  Chief Complaint  Patient presents with  . Post-op Follow-up   Visit Diagnoses:  1. S/P reverse total shoulder arthroplasty, right     Plan: Patient is a 68 year old female who presents s/p right reverse shoulder arthroplasty on 04/15/2019.  Patient notes that she is doing well.  She has been going to outpatient physical therapy 2 times a week.  Her last appointment is scheduled for late December.  She notes that her pain is improved since the last visit as well as her range of motion.  Her only complaint currently is pain at night.  Her complaint of hand weakness has improved and she is able to hold a cup of coffee without any issue now.  She notes that she is very functional with the shoulder.  He is only taking oxycodone at night.  On exam she has abduction to 90 degrees and forward flexion to 120 degrees.  Incision has healed well.  We will work to taper patient off narcotic pain medication.  Prescribed Norco 5 mg and Robaxin.  After this Norco prescription we will prescribe tramadol.  Patient will follow-up in 2 months for clinical recheck.  Follow-Up Instructions: No follow-ups on file.   Orders:  No orders of the defined types were placed in this encounter.  Meds ordered this encounter  Medications  . DISCONTD: HYDROcodone-acetaminophen (NORCO/VICODIN) 5-325 MG tablet    Sig: Take 1 tablet by mouth every 12 (twelve) hours as needed for moderate pain.    Dispense:  42 tablet    Refill:  0  . HYDROcodone-acetaminophen (NORCO/VICODIN) 5-325 MG tablet    Sig: Take 1 tablet by mouth every 12 (twelve) hours as needed for moderate pain.    Dispense:  42 tablet    Refill:  0  . methocarbamol (ROBAXIN) 750 MG tablet    Sig: TAKE 1 TABLET BY MOUTH TWICE A DAY AS NEEDED    Dispense:  35  tablet    Refill:  0    Imaging: No results found.  PMFS History: Patient Active Problem List   Diagnosis Date Noted  . OA (osteoarthritis) of shoulder 04/15/2019  . Shoulder arthritis 08/27/2018  . Primary osteoarthritis, left shoulder 04/01/2018  . Primary osteoarthritis, right shoulder 04/01/2018  . Essential hypertension 01/22/2017  . Controlled type 2 diabetes mellitus without complication, without long-term current use of insulin ([place]) 11/23/2016  . Chronic pain of both shoulders 07/24/2016  . Prediabetes   . Anxiety   . Bipolar 1 disorder ([place])   . Depression   . Chronic back pain 11/07/2012  . Insomnia secondary to depression with anxiety 09/23/2012  . Unspecified vitamin D deficiency 09/23/2012  . COPD, mild ([place]) 07/01/2012  . Acute bronchitis 06/08/2012  . Adenocarcinoma of lung, stage 1 ([place]) 05/23/2012  . RECTAL BLEEDING 04/27/2010  . HEMORRHOIDS 03/11/2009  . ADENOCARCINOMA, BREAST 12/15/2008  . FIBROIDS, UTERUS 12/15/2008  . HYPERCHOLESTEROLEMIA 12/15/2008  . Hyperlipidemia 12/15/2008  . Anxiety and depression 12/15/2008  . GASTROESOPHAGEAL REFLUX DISEASE 12/15/2008  . GASTRITIS 12/15/2008  . CONSTIPATION 12/15/2008  . FLATULENCE ERUCTATION AND GAS PAIN 12/15/2008  . ABDOMINAL PAIN 12/15/2008  . RECTAL BLEEDING, HX OF 12/15/2008   Past Medical History:  Diagnosis Date  . Allergic rhinitis   . Anxiety   . Arthritis   . Bipolar 1 disorder ([place])   . Breast cancer ([place]) 2004  . Carpal tunnel syndrome of left wrist   . Chronic neck pain   . Chronic shoulder pain    L>R  . Depression   . GERD (gastroesophageal reflux disease)   . Headache(784.0)   . History of lung surgery   . Hyperlipidemia   . Hypertension   . Lung cancer ([place])   . Lung nodule 2009  . Osteoarthritis of right shoulder   . Pain in both feet   . Personal history of radiation therapy 2004  . PONV (postoperative nausea and vomiting)   . Type 2 diabetes mellitus ([facility])     Family  History  Problem Relation Age of Onset  . Heart disease Mother        CHF  . Physical abuse Mother   . Depression Mother   . Cancer Sister   . ADD / ADHD Sister   . Alcohol abuse Father   . Alcohol abuse Brother   . Mental illness Brother   . Cancer Sister   . Heart disease Sister   . Physical abuse Sister   . OCD Sister   . Mental illness Sister   . Anxiety disorder Neg Hx   . Bipolar disorder Neg Hx   . Dementia Neg Hx   . Drug abuse Neg Hx   . Paranoid behavior Neg Hx   . Schizophrenia Neg Hx   . Seizures Neg Hx   . Sexual abuse Neg Hx     Past Surgical History:  Procedure Laterality Date  . BREAST SURGERY  2005   lumpectomy - left  . carpel tunnel Bilateral   . CESAREAN SECTION  1971  . COLONOSCOPY WITH PROPOFOL N/A 07/25/2016   Procedure: COLONOSCOPY WITH PROPOFOL;  Surgeon: Danie Binder, MD;  [facility];  Service: Endoscopy;  Laterality: N/A;  8:15 AM  . EYE SURGERY    . LUNG CANCER SURGERY Right 05/23/2012   reportedly clear  . TONSILLECTOMY    . TOTAL SHOULDER ARTHROPLASTY Left 08/27/2018   Procedure: LEFT TOTAL SHOULDER ARTHROPLASTY;  Surgeon: Meredith Pel, MD;  Location: [place];  Service: Orthopedics;  Laterality: Left;  . TOTAL SHOULDER ARTHROPLASTY Right 04/15/2019   Procedure: RIGHT TOTAL SHOULDER ARTHROPLASTY;  Surgeon: Meredith Pel, MD;  Location: [place];  Service: Orthopedics;  Laterality: Right;  . TUBAL LIGATION     Social History   Occupational History  . Occupation: retired    Comment: Location manager  Tobacco Use  . Smoking status: Current Every Day Smoker    Packs/day: 0.50    Years: 0.00    Pack years: 0.00    Types: Cigarettes  . Smokeless tobacco: Never Used  Substance and Sexual Activity  . Alcohol use: Yes    Comment: occasionally   . Drug use: Yes    Types: Marijuana    Comment: smoking daily  . Sexual activity: Never

## 2019-07-14 DIAGNOSIS — F330 Major depressive disorder, recurrent, mild: Secondary | ICD-10-CM | POA: Diagnosis not present

## 2019-07-14 DIAGNOSIS — K5900 Constipation, unspecified: Secondary | ICD-10-CM | POA: Diagnosis not present

## 2019-07-14 DIAGNOSIS — M25511 Pain in right shoulder: Secondary | ICD-10-CM | POA: Diagnosis not present

## 2019-07-14 DIAGNOSIS — G9009 Other idiopathic peripheral autonomic neuropathy: Secondary | ICD-10-CM | POA: Diagnosis not present

## 2019-07-14 DIAGNOSIS — E785 Hyperlipidemia, unspecified: Secondary | ICD-10-CM | POA: Diagnosis not present

## 2019-07-14 DIAGNOSIS — R05 Cough: Secondary | ICD-10-CM | POA: Diagnosis not present

## 2019-07-14 DIAGNOSIS — E1140 Type 2 diabetes mellitus with diabetic neuropathy, unspecified: Secondary | ICD-10-CM | POA: Diagnosis not present

## 2019-07-14 DIAGNOSIS — K219 Gastro-esophageal reflux disease without esophagitis: Secondary | ICD-10-CM | POA: Diagnosis not present

## 2019-07-14 DIAGNOSIS — I10 Essential (primary) hypertension: Secondary | ICD-10-CM | POA: Diagnosis not present

## 2019-07-14 DIAGNOSIS — F17210 Nicotine dependence, cigarettes, uncomplicated: Secondary | ICD-10-CM | POA: Diagnosis not present

## 2019-07-14 DIAGNOSIS — J449 Chronic obstructive pulmonary disease, unspecified: Secondary | ICD-10-CM | POA: Diagnosis not present

## 2019-07-14 DIAGNOSIS — J302 Other seasonal allergic rhinitis: Secondary | ICD-10-CM | POA: Diagnosis not present

## 2019-07-15 ENCOUNTER — Encounter (HOSPITAL_COMMUNITY): Payer: Self-pay | Admitting: Occupational Therapy

## 2019-07-15 ENCOUNTER — Ambulatory Visit (HOSPITAL_COMMUNITY): Payer: Medicare Other | Attending: Orthopedic Surgery | Admitting: Occupational Therapy

## 2019-07-15 ENCOUNTER — Other Ambulatory Visit: Payer: Self-pay

## 2019-07-15 DIAGNOSIS — R29898 Other symptoms and signs involving the musculoskeletal system: Secondary | ICD-10-CM | POA: Diagnosis present

## 2019-07-15 DIAGNOSIS — M25611 Stiffness of right shoulder, not elsewhere classified: Secondary | ICD-10-CM | POA: Diagnosis present

## 2019-07-15 DIAGNOSIS — M25511 Pain in right shoulder: Secondary | ICD-10-CM | POA: Diagnosis present

## 2019-07-15 NOTE — Therapy (Signed)
[place], [name], [address] Phone: [phone]   Fax:  [phone]  Occupational Therapy Treatment  Patient Details  Name: Deborah Mullins MRN: [id number] Date of Birth: 1951-03-09 Referring Provider (OT): Dr. Alphonzo Severance    Encounter Date: 07/15/2019  OT End of Session - 07/15/19 1127    Visit Number  5    Number of Visits  16    Date for OT Re-Evaluation  08/23/19   mini reassess on 07/22/19   Authorization Type  Medicare and [employer]    Authorization Time Period  10th visit progress note due on visit 10    Authorization - Visit Number  5    Authorization - Number of Visits  10    OT Start Time  1031    OT Stop Time  1113    OT Time Calculation (min)  42 min    Activity Tolerance  Patient tolerated treatment well    Behavior During Therapy  [facility] for tasks assessed/performed       Past Medical History:  Diagnosis Date  . Allergic rhinitis   . Anxiety   . Arthritis   . Bipolar 1 disorder ([place])   . Breast cancer ([place]) 2004  . Carpal tunnel syndrome of left wrist   . Chronic neck pain   . Chronic shoulder pain    L>R  . Depression   . GERD (gastroesophageal reflux disease)   . Headache(784.0)   . History of lung surgery   . Hyperlipidemia   . Hypertension   . Lung cancer ([place])   . Lung nodule 2009  . Osteoarthritis of right shoulder   . Pain in both feet   . Personal history of radiation therapy 2004  . PONV (postoperative nausea and vomiting)   . Type 2 diabetes mellitus ([place])     Past Surgical History:  Procedure Laterality Date  . BREAST SURGERY  2005   lumpectomy - left  . carpel tunnel Bilateral   . CESAREAN SECTION  1971  . COLONOSCOPY WITH PROPOFOL N/A 07/25/2016   Procedure: COLONOSCOPY WITH PROPOFOL;  Surgeon: Danie Binder, MD;  [facility];  Service: Endoscopy;  Laterality: N/A;  8:15 AM  . EYE SURGERY    . LUNG CANCER SURGERY Right 05/23/2012   reportedly clear  .  TONSILLECTOMY    . TOTAL SHOULDER ARTHROPLASTY Left 08/27/2018   Procedure: LEFT TOTAL SHOULDER ARTHROPLASTY;  Surgeon: Meredith Pel, MD;  Location: [place];  Service: Orthopedics;  Laterality: Left;  . TOTAL SHOULDER ARTHROPLASTY Right 04/15/2019   Procedure: RIGHT TOTAL SHOULDER ARTHROPLASTY;  Surgeon: Meredith Pel, MD;  Location: [place];  Service: Orthopedics;  Laterality: Right;  . TUBAL LIGATION      There were no vitals filed for this visit.  Subjective Assessment - 07/15/19 1030    Subjective   S: The doctor is really pleased with my motion.    Currently in Pain?  No/denies         [facility] OT Assessment - 07/15/19 1030      Assessment   Medical Diagnosis  S/P Right Reverse Total Shoulder Replacement    Referring Provider (OT)  Dr. Alphonzo Severance       Precautions   Precautions  Shoulder    Type of Shoulder Precautions  progress as tolerated                OT Treatments/Exercises (OP) - 07/15/19 1036  Exercises   Exercises  Shoulder      Shoulder Exercises: Supine   Protraction  PROM;5 reps;AAROM;12 reps    Horizontal ABduction  PROM;5 reps;AAROM;12 reps    External Rotation  PROM;5 reps;AAROM;12 reps    Internal Rotation  PROM;5 reps;AAROM;12 reps    Flexion  PROM;5 reps;AAROM;12 reps    ABduction  PROM;5 reps;AAROM;12 reps      Shoulder Exercises: Standing   Protraction  AAROM;10 reps    Horizontal ABduction  AAROM;10 reps    External Rotation  AAROM;10 reps    Internal Rotation  AAROM;10 reps    Flexion  AAROM;10 reps    ABduction  AAROM;10 reps    Other Standing Exercises  PVC pipe slide: 10X flexion      Shoulder Exercises: ROM/Strengthening   Proximal Shoulder Strengthening, Supine  10X each, no rest breaks      Manual Therapy   Manual Therapy  Myofascial release    Manual therapy comments  manual therapy completed seperately from all other interventions    Myofascial Release  myofascial release and manual stretching to decrease pain  and fascial restrictions and improve pain free mobility in right shoulder region                OT Short Term Goals - 06/27/19 1511      OT SHORT TERM GOAL #1   Title  patient will be educated and independent with a HEP for improved right shoulder mobility.    Time  4    Period  Weeks    Status  On-going    Target Date  07/22/19      OT SHORT TERM GOAL #2   Title  Patient will improve right shoulder P/ROM to [facility] for improved ability to don and doff shirts.    Time  4    Period  Weeks    Status  On-going      OT SHORT TERM GOAL #3   Title  Patient will improve right shoulder strength to 4/5 or stronger for improved ability to lift bags of groceries and laundry baskets.    Time  4    Period  Weeks    Status  On-going      OT SHORT TERM GOAL #4   Title  Patient will decrease pain in her right shoulder to 5/10 or better during adl completion.    Time  4    Period  Weeks    Status  On-going      OT SHORT TERM GOAL #5   Title  Patient will decrease right shoulder fascial restrictions from mod to min for improved mobility needed for adl completion.    Time  4    Period  Weeks    Status  On-going        OT Long Term Goals - 06/27/19 1511      OT LONG TERM GOAL #1   Title  Patient will return to prior level of independence with all b/iadls and leisure activities, using her right arm as dominant.    Time  8    Period  Weeks    Status  On-going      OT LONG TERM GOAL #2   Title  Patient will imrpove right shoulder a/rom to WNL for improved ability to reach into overhead cabinets, comb her hair, and reach behind her back to pull up pants.    Time  8    Period  Weeks  Status  On-going      OT LONG TERM GOAL #3   Title  Patient will improve right shoulder strength to 4+/5 or better for improved ability to lift pots and pans when cooking.    Time  8    Period  Weeks    Status  On-going      OT LONG TERM GOAL #4   Title  Patient will decrease pain in right  shoulder to 3/10 or better when completing functional tasks.    Time  8    Period  Weeks    Status  On-going      OT LONG TERM GOAL #5   Title  Patient will decrease fascial restrictions to min-trace or better for improved mobility needed for adl completion.    Time  8    Period  Weeks    Status  On-going            Plan - 07/15/19 1127    Clinical Impression Statement  A: Pt reports MD is pleased with her progress so far. Continued with manual therapy to address fascial restrictions in right upper arm and trapezius regions. Pt with improvement in tolerance for passive stretching today, able to achieve ROM at 75% for flexion, abduction; >50% for er. Continued with AA/ROM in supine and standing, added pvc pipe slide today. Verbal cuing for form and technique.    Body Structure / Function / Physical Skills  ADL;Muscle spasms;Scar mobility;UE functional use;Fascial restriction;Pain;Strength;ROM    Plan  P: Continue working to progress ROM within pain tolerance, add pulleys-standing for abduction and sitting for flexion       Patient will benefit from skilled therapeutic intervention in order to improve the following deficits and impairments:   Body Structure / Function / Physical Skills: ADL, Muscle spasms, Scar mobility, UE functional use, Fascial restriction, Pain, Strength, ROM       Visit Diagnosis: Acute pain of right shoulder  Other symptoms and signs involving the musculoskeletal system  Stiffness of right shoulder, not elsewhere classified    Problem List Patient Active Problem List   Diagnosis Date Noted  . OA (osteoarthritis) of shoulder 04/15/2019  . Shoulder arthritis 08/27/2018  . Primary osteoarthritis, left shoulder 04/01/2018  . Primary osteoarthritis, right shoulder 04/01/2018  . Essential hypertension 01/22/2017  . Controlled type 2 diabetes mellitus without complication, without long-term current use of insulin ([place]) 11/23/2016  . Chronic pain of both  shoulders 07/24/2016  . Prediabetes   . Anxiety   . Bipolar 1 disorder ([place])   . Depression   . Chronic back pain 11/07/2012  . Insomnia secondary to depression with anxiety 09/23/2012  . Unspecified vitamin D deficiency 09/23/2012  . COPD, mild ([place]) 07/01/2012  . Acute bronchitis 06/08/2012  . Adenocarcinoma of lung, stage 1 ([place]) 05/23/2012  . RECTAL BLEEDING 04/27/2010  . HEMORRHOIDS 03/11/2009  . ADENOCARCINOMA, BREAST 12/15/2008  . FIBROIDS, UTERUS 12/15/2008  . HYPERCHOLESTEROLEMIA 12/15/2008  . Hyperlipidemia 12/15/2008  . Anxiety and depression 12/15/2008  . GASTROESOPHAGEAL REFLUX DISEASE 12/15/2008  . GASTRITIS 12/15/2008  . CONSTIPATION 12/15/2008  . FLATULENCE ERUCTATION AND GAS PAIN 12/15/2008  . ABDOMINAL PAIN 12/15/2008  . RECTAL BLEEDING, HX OF 12/15/2008   Guadelupe Sabin, OTR/L  [phone] 07/15/2019, 11:30 AM  [place], [name], [address] Phone: [phone]   Fax:  [phone]  Name: SHARLOTTE BAKA MRN: [id number] Date of Birth: April 04, 1951

## 2019-07-17 ENCOUNTER — Other Ambulatory Visit: Payer: Self-pay

## 2019-07-17 ENCOUNTER — Encounter (HOSPITAL_COMMUNITY): Payer: Self-pay

## 2019-07-17 ENCOUNTER — Ambulatory Visit (HOSPITAL_COMMUNITY): Payer: Medicare Other

## 2019-07-17 DIAGNOSIS — M25511 Pain in right shoulder: Secondary | ICD-10-CM

## 2019-07-17 DIAGNOSIS — M25611 Stiffness of right shoulder, not elsewhere classified: Secondary | ICD-10-CM

## 2019-07-17 DIAGNOSIS — R29898 Other symptoms and signs involving the musculoskeletal system: Secondary | ICD-10-CM

## 2019-07-17 NOTE — Patient Instructions (Addendum)
Repeat all exercises 10-15 times, 1-2 times per day.  1) Shoulder Protraction    Begin with elbows by your side, slowly "punch" straight out in front of you.      2) Shoulder Flexion  Standing:         Begin with arms at your side with thumbs pointed up, slowly raise both arms up and forward towards overhead.          3) Horizontal abduction/adduction   Standing:           Begin with arms straight out in front of you, bring out to the side in at "T" shape. Keep arms straight entire time.         4) Internal & External Rotation -----Continue using the cane for this one at home.    *No band* -Stand with elbows at the side and elbows bent 90 degrees. Move your forearms away from your body, then bring back inward toward the body.     5) Shoulder Abduction  Standing:       Begin with your arms next to your side. Slowly move your arms out to the side so that they go overhead, in a jumping jack or snow angel movement.

## 2019-07-17 NOTE — Therapy (Signed)
[place], [name], [address] Phone: [phone]   Fax:  [phone]  Occupational Therapy Treatment  Patient Details  Name: Deborah Mullins MRN: [id number] Date of Birth: 1951/01/10 Referring Provider (OT): Dr. Alphonzo Severance    Encounter Date: 07/17/2019  OT End of Session - 07/17/19 1100    Visit Number  6    Number of Visits  16    Date for OT Re-Evaluation  08/23/19   mini reassess on 07/22/19   Authorization Type  Medicare and [employer]    Authorization Time Period  10th visit progress note due on visit 10    Authorization - Visit Number  6    Authorization - Number of Visits  10    OT Start Time  0945    OT Stop Time  1023    OT Time Calculation (min)  38 min    Activity Tolerance  Patient tolerated treatment well    Behavior During Therapy  [facility] for tasks assessed/performed       Past Medical History:  Diagnosis Date  . Allergic rhinitis   . Anxiety   . Arthritis   . Bipolar 1 disorder ([place])   . Breast cancer ([place]) 2004  . Carpal tunnel syndrome of left wrist   . Chronic neck pain   . Chronic shoulder pain    L>R  . Depression   . GERD (gastroesophageal reflux disease)   . Headache(784.0)   . History of lung surgery   . Hyperlipidemia   . Hypertension   . Lung cancer ([place])   . Lung nodule 2009  . Osteoarthritis of right shoulder   . Pain in both feet   . Personal history of radiation therapy 2004  . PONV (postoperative nausea and vomiting)   . Type 2 diabetes mellitus ([place])     Past Surgical History:  Procedure Laterality Date  . BREAST SURGERY  2005   lumpectomy - left  . carpel tunnel Bilateral   . CESAREAN SECTION  1971  . COLONOSCOPY WITH PROPOFOL N/A 07/25/2016   Procedure: COLONOSCOPY WITH PROPOFOL;  Surgeon: Danie Binder, MD;  [facility];  Service: Endoscopy;  Laterality: N/A;  8:15 AM  . EYE SURGERY    . LUNG CANCER SURGERY Right 05/23/2012   reportedly clear  .  TONSILLECTOMY    . TOTAL SHOULDER ARTHROPLASTY Left 08/27/2018   Procedure: LEFT TOTAL SHOULDER ARTHROPLASTY;  Surgeon: Meredith Pel, MD;  Location: [place];  Service: Orthopedics;  Laterality: Left;  . TOTAL SHOULDER ARTHROPLASTY Right 04/15/2019   Procedure: RIGHT TOTAL SHOULDER ARTHROPLASTY;  Surgeon: Meredith Pel, MD;  Location: [place];  Service: Orthopedics;  Laterality: Right;  . TUBAL LIGATION      There were no vitals filed for this visit.  Subjective Assessment - 07/17/19 1005    Subjective   S: I think I can do it without the stick. I have been.    Currently in Pain?  No/denies         [facility] OT Assessment - 07/17/19 1006      Assessment   Medical Diagnosis  S/P Right Reverse Total Shoulder Replacement      Precautions   Precautions  Shoulder    Type of Shoulder Precautions  progress as tolerated                OT Treatments/Exercises (OP) - 07/17/19 1006      Exercises  Exercises  Shoulder      Shoulder Exercises: Supine   Protraction  PROM;5 reps;AROM;12 reps    Horizontal ABduction  PROM;5 reps;AROM;12 reps    External Rotation  PROM;5 reps;AAROM;12 reps    Internal Rotation  PROM;5 reps;AROM;12 reps    Flexion  PROM;5 reps;AROM;12 reps    ABduction  PROM;5 reps;AROM;12 reps      Shoulder Exercises: Seated   Protraction  AROM;10 reps    Horizontal ABduction  AROM;10 reps    External Rotation  AAROM;10 reps    Internal Rotation  AAROM;10 reps    Flexion  AROM;10 reps    Abduction  AROM;10 reps      Shoulder Exercises: Pulleys   Flexion  --   seated   ABduction  --   standing     Manual Therapy   Manual Therapy  Myofascial release    Manual therapy comments  manual therapy completed seperately from all other interventions    Myofascial Release  myofascial release and manual stretching to decrease pain and fascial restrictions and improve pain free mobility in right shoulder region              OT Education - 07/17/19 1018     Education Details  A/ROM shoulder exercises. Continue completing IR/er AA/ROM.    Person(s) Educated  Patient    Methods  Explanation;Demonstration;Handout;Verbal cues    Comprehension  Returned demonstration;Verbalized understanding       OT Short Term Goals - 06/27/19 1511      OT SHORT TERM GOAL #1   Title  patient will be educated and independent with a HEP for improved right shoulder mobility.    Time  4    Period  Weeks    Status  On-going    Target Date  07/22/19      OT SHORT TERM GOAL #2   Title  Patient will improve right shoulder P/ROM to [facility] for improved ability to don and doff shirts.    Time  4    Period  Weeks    Status  On-going      OT SHORT TERM GOAL #3   Title  Patient will improve right shoulder strength to 4/5 or stronger for improved ability to lift bags of groceries and laundry baskets.    Time  4    Period  Weeks    Status  On-going      OT SHORT TERM GOAL #4   Title  Patient will decrease pain in her right shoulder to 5/10 or better during adl completion.    Time  4    Period  Weeks    Status  On-going      OT SHORT TERM GOAL #5   Title  Patient will decrease right shoulder fascial restrictions from mod to min for improved mobility needed for adl completion.    Time  4    Period  Weeks    Status  On-going        OT Long Term Goals - 06/27/19 1511      OT LONG TERM GOAL #1   Title  Patient will return to prior level of independence with all b/iadls and leisure activities, using her right arm as dominant.    Time  8    Period  Weeks    Status  On-going      OT LONG TERM GOAL #2   Title  Patient will imrpove right shoulder a/rom to WNL for improved ability  to reach into overhead cabinets, comb her hair, and reach behind her back to pull up pants.    Time  8    Period  Weeks    Status  On-going      OT LONG TERM GOAL #3   Title  Patient will improve right shoulder strength to 4+/5 or better for improved ability to lift pots and pans  when cooking.    Time  8    Period  Weeks    Status  On-going      OT LONG TERM GOAL #4   Title  Patient will decrease pain in right shoulder to 3/10 or better when completing functional tasks.    Time  8    Period  Weeks    Status  On-going      OT LONG TERM GOAL #5   Title  Patient will decrease fascial restrictions to min-trace or better for improved mobility needed for adl completion.    Time  8    Period  Weeks    Status  On-going            Plan - 07/17/19 1124    Clinical Impression Statement  A: Patient was able to complete A/ROM supine and seated with the exception of external rotation in which she is continuing to use the PVC pipe to help with increasing ROM. VC for form and technique were provided. Manual techniques completed to address fascial restrictions.    Body Structure / Function / Physical Skills  ADL;Muscle spasms;Scar mobility;UE functional use;Fascial restriction;Pain;Strength;ROM    Plan  P: Follow up on HEP. Complete wall wash and functional reaching task.    Consulted and Agree with Plan of Care  Patient       Patient will benefit from skilled therapeutic intervention in order to improve the following deficits and impairments:   Body Structure / Function / Physical Skills: ADL, Muscle spasms, Scar mobility, UE functional use, Fascial restriction, Pain, Strength, ROM       Visit Diagnosis: Stiffness of right shoulder, not elsewhere classified  Other symptoms and signs involving the musculoskeletal system  Acute pain of right shoulder    Problem List Patient Active Problem List   Diagnosis Date Noted  . OA (osteoarthritis) of shoulder 04/15/2019  . Shoulder arthritis 08/27/2018  . Primary osteoarthritis, left shoulder 04/01/2018  . Primary osteoarthritis, right shoulder 04/01/2018  . Essential hypertension 01/22/2017  . Controlled type 2 diabetes mellitus without complication, without long-term current use of insulin ([place]) 11/23/2016  .  Chronic pain of both shoulders 07/24/2016  . Prediabetes   . Anxiety   . Bipolar 1 disorder ([place])   . Depression   . Chronic back pain 11/07/2012  . Insomnia secondary to depression with anxiety 09/23/2012  . Unspecified vitamin D deficiency 09/23/2012  . COPD, mild ([place]) 07/01/2012  . Acute bronchitis 06/08/2012  . Adenocarcinoma of lung, stage 1 ([place]) 05/23/2012  . RECTAL BLEEDING 04/27/2010  . HEMORRHOIDS 03/11/2009  . ADENOCARCINOMA, BREAST 12/15/2008  . FIBROIDS, UTERUS 12/15/2008  . HYPERCHOLESTEROLEMIA 12/15/2008  . Hyperlipidemia 12/15/2008  . Anxiety and depression 12/15/2008  . GASTROESOPHAGEAL REFLUX DISEASE 12/15/2008  . GASTRITIS 12/15/2008  . CONSTIPATION 12/15/2008  . FLATULENCE ERUCTATION AND GAS PAIN 12/15/2008  . ABDOMINAL PAIN 12/15/2008  . RECTAL BLEEDING, HX OF 12/15/2008   Ailene Ravel, OTR/L,CBIS  [phone]  07/17/2019, 11:28 AM  [place] [address] [place], [name], [address] Phone: [phone]   Fax:  [phone]  Name: Deborah Mullins MRN: [id number] Date of Birth: 1950-08-19

## 2019-07-22 ENCOUNTER — Other Ambulatory Visit: Payer: Self-pay

## 2019-07-22 ENCOUNTER — Ambulatory Visit (HOSPITAL_COMMUNITY): Payer: Medicare Other

## 2019-07-22 ENCOUNTER — Encounter (HOSPITAL_COMMUNITY): Payer: Self-pay

## 2019-07-22 DIAGNOSIS — M25511 Pain in right shoulder: Secondary | ICD-10-CM | POA: Diagnosis not present

## 2019-07-22 DIAGNOSIS — R29898 Other symptoms and signs involving the musculoskeletal system: Secondary | ICD-10-CM

## 2019-07-22 DIAGNOSIS — M25611 Stiffness of right shoulder, not elsewhere classified: Secondary | ICD-10-CM

## 2019-07-22 NOTE — Therapy (Addendum)
[place], [name], [address] Phone: [phone]   Fax:  [phone]  Occupational Therapy Treatment  Patient Details  Name: JAYNA MULNIX MRN: [id number] Date of Birth: [birth year] Referring Provider (OT): Dr. Alphonzo Severance   Progress Note Reporting Period 06/24/19 to 07/22/19  See note below for Objective Data and Assessment of Progress/Goals.       Encounter Date: 07/22/2019  OT End of Session - 07/22/19 1221    Visit Number  7    Number of Visits  16    Date for OT Re-Evaluation  08/23/19   mini reassess on 07/22/19   Authorization Type  Medicare and [employer]    Authorization Time Period  10th visit progress note due on visit 17    Authorization - Visit Number  7    Authorization - Number of Visits  17    OT Start Time  [phone]   mini reassess   OT Stop Time  1026    OT Time Calculation (min)  38 min    Activity Tolerance  Patient tolerated treatment well    Behavior During Therapy  WFL for tasks assessed/performed       Past Medical History:  Diagnosis Date  . Allergic rhinitis   . Anxiety   . Arthritis   . Bipolar 1 disorder ([place])   . Breast cancer ([place]) 2004  . Carpal tunnel syndrome of left wrist   . Chronic neck pain   . Chronic shoulder pain    L>R  . Depression   . GERD (gastroesophageal reflux disease)   . Headache(784.0)   . History of lung surgery   . Hyperlipidemia   . Hypertension   . Lung cancer ([place])   . Lung nodule 2009  . Osteoarthritis of right shoulder   . Pain in both feet   . Personal history of radiation therapy 2004  . PONV (postoperative nausea and vomiting)   . Type 2 diabetes mellitus ([place])     Past Surgical History:  Procedure Laterality Date  . BREAST SURGERY  2005   lumpectomy - left  . carpel tunnel Bilateral   . CESAREAN SECTION  1971  . COLONOSCOPY WITH PROPOFOL N/A 07/25/2016   Procedure: COLONOSCOPY WITH PROPOFOL;  Surgeon: Danie Binder, MD;  [facility];  Service: Endoscopy;  Laterality: N/A;  8:15 AM  . EYE SURGERY    . LUNG CANCER SURGERY Right 05/23/2012   reportedly clear  . TONSILLECTOMY    . TOTAL SHOULDER ARTHROPLASTY Left 08/27/2018   Procedure: LEFT TOTAL SHOULDER ARTHROPLASTY;  Surgeon: Meredith Pel, MD;  Location: [facility];  Service: Orthopedics;  Laterality: Left;  . TOTAL SHOULDER ARTHROPLASTY Right 04/15/2019   Procedure: RIGHT TOTAL SHOULDER ARTHROPLASTY;  Surgeon: Meredith Pel, MD;  Location: [facility];  Service: Orthopedics;  Laterality: Right;  . TUBAL LIGATION      There were no vitals filed for this visit.  Subjective Assessment - 07/22/19 1024    Subjective   S: I do punching all the time with this arm.    Currently in Pain?  No/denies         [facility] OT Assessment - 07/22/19 8182      Assessment   Medical Diagnosis  S/P Right Reverse Total Shoulder Replacement      Precautions   Precautions  Shoulder    Type of Shoulder Precautions  progress as tolerated  Observation/Other Assessments   Focus on Therapeutic Outcomes (FOTO)   63/100      ROM / Strength   AROM / PROM / Strength  AROM;PROM;Strength      AROM   Overall AROM Comments  assess in seated position, external and internal rotation with shoulder adducted    AROM Assessment Site  Shoulder    Right/Left Shoulder  Right    Right Shoulder Flexion  135 Degrees   previous: 92   Right Shoulder ABduction  100 Degrees   previous: same   Right Shoulder Internal Rotation  90 Degrees   previous: 85   Right Shoulder External Rotation  58 Degrees   previous: 32     PROM   Overall PROM Comments  assessed in supine, external and internal rotation with shoulder adducted    PROM Assessment Site  Shoulder    Right/Left Shoulder  Right    Right Shoulder Flexion  141 Degrees   previous: 110   Right Shoulder ABduction  155 Degrees   previous: 65   Right Shoulder Internal Rotation  90 Degrees   previous: 85   Right Shoulder External  Rotation  52 Degrees   previous: 25     Strength   Overall Strength Comments  assessed in seated, external and internal rotation with shoulder adducted    Strength Assessment Site  Shoulder    Right/Left Shoulder  Right    Right Shoulder Flexion  4/5   previous: 4-/5   Right Shoulder ABduction  4/5   previous: 4-/5   Right Shoulder Internal Rotation  4-/5   previous: 4-/5   Right Shoulder External Rotation  5/5   previous: 4-/5              OT Treatments/Exercises (OP) - 07/22/19 0958      Exercises   Exercises  Shoulder      Shoulder Exercises: Supine   Protraction  PROM;5 reps;AROM;15 reps    Horizontal ABduction  PROM;5 reps;AROM;15 reps    External Rotation  PROM;5 reps;AROM;15 reps    Internal Rotation  PROM;5 reps;AROM;15 reps    Flexion  PROM;5 reps;AROM;15 reps    ABduction  PROM;5 reps;AROM;15 reps      Shoulder Exercises: ROM/Strengthening   Wall Wash  1'    Other ROM/Strengthening Exercises  PVC pipe slide; 12X             OT Education - 07/22/19 1220    Education Details  reviewed progress in therapy. reviewed goals, completed FOTO, discussed limitations, discussed HEP    Person(s) Educated  Patient    Methods  Explanation    Comprehension  Verbalized understanding       OT Short Term Goals - 07/22/19 1016      OT SHORT TERM GOAL #1   Title  patient will be educated and independent with a HEP for improved right shoulder mobility.    Time  4    Period  Weeks    Status  Achieved    Target Date  07/22/19      OT SHORT TERM GOAL #2   Title  Patient will improve right shoulder P/ROM to [facility] for improved ability to don and doff shirts.    Time  4    Period  Weeks    Status  Achieved      OT SHORT TERM GOAL #3   Title  Patient will improve right shoulder strength to 4/5 or stronger for improved ability to lift  bags of groceries and laundry baskets.    Time  4    Period  Weeks    Status  Achieved      OT SHORT TERM GOAL #4   Title   Patient will decrease pain in her right shoulder to 5/10 or better during adl completion.    Time  4    Period  Weeks    Status  Partially Met      OT SHORT TERM GOAL #5   Title  Patient will decrease right shoulder fascial restrictions from mod to min for improved mobility needed for adl completion.    Time  4    Period  Weeks    Status  Achieved        OT Long Term Goals - 06/27/19 1511      OT LONG TERM GOAL #1   Title  Patient will return to prior level of independence with all b/iadls and leisure activities, using her right arm as dominant.    Time  8    Period  Weeks    Status  On-going      OT LONG TERM GOAL #2   Title  Patient will imrpove right shoulder a/rom to WNL for improved ability to reach into overhead cabinets, comb her hair, and reach behind her back to pull up pants.    Time  8    Period  Weeks    Status  On-going      OT LONG TERM GOAL #3   Title  Patient will improve right shoulder strength to 4+/5 or better for improved ability to lift pots and pans when cooking.    Time  8    Period  Weeks    Status  On-going      OT LONG TERM GOAL #4   Title  Patient will decrease pain in right shoulder to 3/10 or better when completing functional tasks.    Time  8    Period  Weeks    Status  On-going      OT LONG TERM GOAL #5   Title  Patient will decrease fascial restrictions to min-trace or better for improved mobility needed for adl completion.    Time  8    Period  Weeks    Status  On-going            Plan - 07/22/19 1221    Clinical Impression Statement  A: Mini reassessment completed this date. Patient is steadily making progress in therapy while meeting 4/5 short term goals and partially meeting her 5th goal. Patient reports that she is completing her HEP without difficulty. She has shown improvement with ROM, strength, and decreasing pain and fascial restrictions. She continues to have deficits in all areas and will continue to benefit from  skilled OT services. Added pvc pipe slide today to further increase functional mobility needed for reaching. VC for form and technique were provided.    Body Structure / Function / Physical Skills  ADL;Muscle spasms;Scar mobility;UE functional use;Fascial restriction;Pain;Strength;ROM    Plan  P: Add UBE bike. Attempt 1# hand weight supine    Consulted and Agree with Plan of Care  Patient       Patient will benefit from skilled therapeutic intervention in order to improve the following deficits and impairments:   Body Structure / Function / Physical Skills: ADL, Muscle spasms, Scar mobility, UE functional use, Fascial restriction, Pain, Strength, ROM       Visit Diagnosis: Stiffness  of right shoulder, not elsewhere classified  Other symptoms and signs involving the musculoskeletal system  Acute pain of right shoulder    Problem List Patient Active Problem List   Diagnosis Date Noted  . OA (osteoarthritis) of shoulder 04/15/2019  . Shoulder arthritis 08/27/2018  . Primary osteoarthritis, left shoulder 04/01/2018  . Primary osteoarthritis, right shoulder 04/01/2018  . Essential hypertension 01/22/2017  . Controlled type 2 diabetes mellitus without complication, without long-term current use of insulin ([place]) 11/23/2016  . Chronic pain of both shoulders 07/24/2016  . Prediabetes   . Anxiety   . Bipolar 1 disorder ([place])   . Depression   . Chronic back pain 11/07/2012  . Insomnia secondary to depression with anxiety 09/23/2012  . Unspecified vitamin D deficiency 09/23/2012  . COPD, mild ([place]) 07/01/2012  . Acute bronchitis 06/08/2012  . Adenocarcinoma of lung, stage 1 ([place]) 05/23/2012  . RECTAL BLEEDING 04/27/2010  . HEMORRHOIDS 03/11/2009  . ADENOCARCINOMA, BREAST 12/15/2008  . FIBROIDS, UTERUS 12/15/2008  . HYPERCHOLESTEROLEMIA 12/15/2008  . Hyperlipidemia 12/15/2008  . Anxiety and depression 12/15/2008  . GASTROESOPHAGEAL REFLUX DISEASE 12/15/2008  . GASTRITIS  12/15/2008  . CONSTIPATION 12/15/2008  . FLATULENCE ERUCTATION AND GAS PAIN 12/15/2008  . ABDOMINAL PAIN 12/15/2008  . RECTAL BLEEDING, HX OF 12/15/2008   Ailene Ravel, OTR/L,CBIS  [phone]  07/22/2019, 12:35 PM  [facility] [address] [place], [name], [address] Phone: [phone]   Fax:  [phone]  Name: NAKOMA GOTWALT MRN: [id number] Date of Birth: 16-Apr-1951

## 2019-07-25 ENCOUNTER — Other Ambulatory Visit: Payer: Self-pay

## 2019-07-25 ENCOUNTER — Encounter (HOSPITAL_COMMUNITY): Payer: Self-pay

## 2019-07-25 ENCOUNTER — Ambulatory Visit (HOSPITAL_COMMUNITY): Payer: Medicare Other

## 2019-07-25 DIAGNOSIS — M25511 Pain in right shoulder: Secondary | ICD-10-CM | POA: Diagnosis not present

## 2019-07-25 DIAGNOSIS — R29898 Other symptoms and signs involving the musculoskeletal system: Secondary | ICD-10-CM

## 2019-07-25 DIAGNOSIS — M25611 Stiffness of right shoulder, not elsewhere classified: Secondary | ICD-10-CM

## 2019-07-25 NOTE — Therapy (Signed)
[place], [name], [address] Phone: [phone]   Fax:  [phone]  Occupational Therapy Treatment  Patient Details  Name: Deborah Mullins MRN: [id number] Date of Birth: Sep 25, 1950 Referring Provider (OT): Dr. Alphonzo Severance    Encounter Date: 07/25/2019  OT End of Session - 07/25/19 1109    Visit Number  8    Number of Visits  16    Date for OT Re-Evaluation  08/23/19    Authorization Type  Medicare and [employer]    Authorization Time Period  10th visit progress note due on visit 17    Authorization - Visit Number  8    Authorization - Number of Visits  17    OT Start Time  1033    OT Stop Time  1111    OT Time Calculation (min)  38 min    Activity Tolerance  Patient tolerated treatment well    Behavior During Therapy  [facility] for tasks assessed/performed       Past Medical History:  Diagnosis Date  . Allergic rhinitis   . Anxiety   . Arthritis   . Bipolar 1 disorder ([place])   . Breast cancer ([place]) 2004  . Carpal tunnel syndrome of left wrist   . Chronic neck pain   . Chronic shoulder pain    L>R  . Depression   . GERD (gastroesophageal reflux disease)   . Headache(784.0)   . History of lung surgery   . Hyperlipidemia   . Hypertension   . Lung cancer ([place])   . Lung nodule 2009  . Osteoarthritis of right shoulder   . Pain in both feet   . Personal history of radiation therapy 2004  . PONV (postoperative nausea and vomiting)   . Type 2 diabetes mellitus ([place])     Past Surgical History:  Procedure Laterality Date  . BREAST SURGERY  2005   lumpectomy - left  . carpel tunnel Bilateral   . CESAREAN SECTION  1971  . COLONOSCOPY WITH PROPOFOL N/A 07/25/2016   Procedure: COLONOSCOPY WITH PROPOFOL;  Surgeon: Danie Binder, MD;  [facility];  Service: Endoscopy;  Laterality: N/A;  8:15 AM  . EYE SURGERY    . LUNG CANCER SURGERY Right 05/23/2012   reportedly clear  . TONSILLECTOMY    . TOTAL  SHOULDER ARTHROPLASTY Left 08/27/2018   Procedure: LEFT TOTAL SHOULDER ARTHROPLASTY;  Surgeon: Meredith Pel, MD;  Location: [place];  Service: Orthopedics;  Laterality: Left;  . TOTAL SHOULDER ARTHROPLASTY Right 04/15/2019   Procedure: RIGHT TOTAL SHOULDER ARTHROPLASTY;  Surgeon: Meredith Pel, MD;  Location: [facility];  Service: Orthopedics;  Laterality: Right;  . TUBAL LIGATION      There were no vitals filed for this visit.  Subjective Assessment - 07/25/19 1050    Subjective   S: It was sore this morning when I work up at [employer] with my husband. Both arms were sore actually.    Currently in Pain?  No/denies         [facility] OT Assessment - 07/25/19 1051      Assessment   Medical Diagnosis  S/P Right Reverse Total Shoulder Replacement      Precautions   Precautions  Shoulder    Type of Shoulder Precautions  progress as tolerated                OT Treatments/Exercises (OP) - 07/25/19 1051      Exercises  Exercises  Shoulder      Shoulder Exercises: Supine   Protraction  PROM;5 reps;Strengthening;12 reps    Protraction Weight (lbs)  1    Horizontal ABduction  PROM;5 reps;Strengthening;12 reps    Horizontal ABduction Weight (lbs)  1    External Rotation  PROM;5 reps;Strengthening;12 reps    External Rotation Weight (lbs)  1    Internal Rotation  PROM;5 reps;Strengthening;12 reps    Internal Rotation Weight (lbs)  1    Flexion  PROM;5 reps;Strengthening;12 reps    Shoulder Flexion Weight (lbs)  1    ABduction  PROM;5 reps;Strengthening;12 reps    Shoulder ABduction Weight (lbs)  1      Shoulder Exercises: Standing   Protraction  Strengthening;10 reps    Protraction Weight (lbs)  1    Horizontal ABduction  Strengthening;10 reps    Horizontal ABduction Weight (lbs)  1    External Rotation  Strengthening;10 reps    External Rotation Weight (lbs)  1    Internal Rotation  Strengthening;10 reps    Internal Rotation Weight (lbs)  1    Flexion  Strengthening;10  reps    Shoulder Flexion Weight (lbs)  1    ABduction  Strengthening;10 reps    Shoulder ABduction Weight (lbs)  1      Shoulder Exercises: ROM/Strengthening   UBE (Upper Arm Bike)  Level 1 2' forward   pace: 7.0   Proximal Shoulder Strengthening, Supine  12X with 1# one rest break    Proximal Shoulder Strengthening, Seated  10X A/ROM no rest breaks      Manual Therapy   Manual Therapy  Myofascial release    Manual therapy comments  manual therapy completed seperately from all other interventions    Myofascial Release  myofascial release and manual stretching to decrease pain and fascial restrictions and improve pain free mobility in right shoulder region              OT Education - 07/25/19 1249    Education Details  Educated patient to begin using 1# weight, can of soup, or water bottle for strengthening standing. Complete 10 repetitions.    Person(s) Educated  Patient    Methods  Explanation    Comprehension  Verbalized understanding       OT Short Term Goals - 07/25/19 1109      OT SHORT TERM GOAL #1   Title  patient will be educated and independent with a HEP for improved right shoulder mobility.    Time  4    Period  Weeks    Target Date  07/22/19      OT SHORT TERM GOAL #2   Title  Patient will improve right shoulder P/ROM to [facility] for improved ability to don and doff shirts.    Time  4    Period  Weeks      OT SHORT TERM GOAL #3   Title  Patient will improve right shoulder strength to 4/5 or stronger for improved ability to lift bags of groceries and laundry baskets.    Time  4    Period  Weeks      OT SHORT TERM GOAL #4   Title  Patient will decrease pain in her right shoulder to 5/10 or better during adl completion.    Time  4    Period  Weeks    Status  Partially Met      OT SHORT TERM GOAL #5   Title  Patient will  decrease right shoulder fascial restrictions from mod to min for improved mobility needed for adl completion.    Time  4    Period   Weeks        OT Long Term Goals - 06/27/19 1511      OT LONG TERM GOAL #1   Title  Patient will return to prior level of independence with all b/iadls and leisure activities, using her right arm as dominant.    Time  8    Period  Weeks    Status  On-going      OT LONG TERM GOAL #2   Title  Patient will imrpove right shoulder a/rom to WNL for improved ability to reach into overhead cabinets, comb her hair, and reach behind her back to pull up pants.    Time  8    Period  Weeks    Status  On-going      OT LONG TERM GOAL #3   Title  Patient will improve right shoulder strength to 4+/5 or better for improved ability to lift pots and pans when cooking.    Time  8    Period  Weeks    Status  On-going      OT LONG TERM GOAL #4   Title  Patient will decrease pain in right shoulder to 3/10 or better when completing functional tasks.    Time  8    Period  Weeks    Status  On-going      OT LONG TERM GOAL #5   Title  Patient will decrease fascial restrictions to min-trace or better for improved mobility needed for adl completion.    Time  8    Period  Weeks    Status  On-going            Plan - 07/25/19 1249    Clinical Impression Statement  A: Progressed to 1# hand weights supine and standing with some muscle fatigue noted. Rest breaks taken as needed. Provided VC  for form and technique. Manual telchniques completed to address fascial restrictions. Trace amount noted this session. Patient able to achieve functional passive and A/ROM.    Body Structure / Function / Physical Skills  ADL;Muscle spasms;Scar mobility;UE functional use;Fascial restriction;Pain;Strength;ROM    Plan  P: Continue with 1# hand weight. Add ball on the wall.    Consulted and Agree with Plan of Care  Patient       Patient will benefit from skilled therapeutic intervention in order to improve the following deficits and impairments:   Body Structure / Function / Physical Skills: ADL, Muscle spasms, Scar  mobility, UE functional use, Fascial restriction, Pain, Strength, ROM       Visit Diagnosis: Stiffness of right shoulder, not elsewhere classified  Other symptoms and signs involving the musculoskeletal system  Acute pain of right shoulder    Problem List Patient Active Problem List   Diagnosis Date Noted  . OA (osteoarthritis) of shoulder 04/15/2019  . Shoulder arthritis 08/27/2018  . Primary osteoarthritis, left shoulder 04/01/2018  . Primary osteoarthritis, right shoulder 04/01/2018  . Essential hypertension 01/22/2017  . Controlled type 2 diabetes mellitus without complication, without long-term current use of insulin ([place]) 11/23/2016  . Chronic pain of both shoulders 07/24/2016  . Prediabetes   . Anxiety   . Bipolar 1 disorder ([place])   . Depression   . Chronic back pain 11/07/2012  . Insomnia secondary to depression with anxiety 09/23/2012  . Unspecified vitamin D deficiency  09/23/2012  . COPD, mild ([place]) 07/01/2012  . Acute bronchitis 06/08/2012  . Adenocarcinoma of lung, stage 1 ([place]) 05/23/2012  . RECTAL BLEEDING 04/27/2010  . HEMORRHOIDS 03/11/2009  . ADENOCARCINOMA, BREAST 12/15/2008  . FIBROIDS, UTERUS 12/15/2008  . HYPERCHOLESTEROLEMIA 12/15/2008  . Hyperlipidemia 12/15/2008  . Anxiety and depression 12/15/2008  . GASTROESOPHAGEAL REFLUX DISEASE 12/15/2008  . GASTRITIS 12/15/2008  . CONSTIPATION 12/15/2008  . FLATULENCE ERUCTATION AND GAS PAIN 12/15/2008  . ABDOMINAL PAIN 12/15/2008  . RECTAL BLEEDING, HX OF 12/15/2008   Ailene Ravel, OTR/L,CBIS  [phone]  07/25/2019, 12:51 PM  [facility] [address] [place], [name], [address] Phone: [phone]   Fax:  [phone]  Name: SHARAN MCENANEY MRN: [id number] Date of Birth: Oct 14, 1950

## 2019-07-29 ENCOUNTER — Other Ambulatory Visit: Payer: Self-pay

## 2019-07-29 ENCOUNTER — Telehealth: Payer: Self-pay | Admitting: Orthopedic Surgery

## 2019-07-29 ENCOUNTER — Encounter (HOSPITAL_COMMUNITY): Payer: Self-pay | Admitting: Occupational Therapy

## 2019-07-29 ENCOUNTER — Ambulatory Visit (HOSPITAL_COMMUNITY): Payer: Medicare Other | Admitting: Occupational Therapy

## 2019-07-29 ENCOUNTER — Other Ambulatory Visit: Payer: Self-pay | Admitting: Surgical

## 2019-07-29 DIAGNOSIS — M25611 Stiffness of right shoulder, not elsewhere classified: Secondary | ICD-10-CM

## 2019-07-29 DIAGNOSIS — M25511 Pain in right shoulder: Secondary | ICD-10-CM

## 2019-07-29 DIAGNOSIS — R29898 Other symptoms and signs involving the musculoskeletal system: Secondary | ICD-10-CM

## 2019-07-29 MED ORDER — HYDROCODONE-ACETAMINOPHEN 5-325 MG PO TABS: 1.0000 | ORAL_TABLET | Freq: Every day | ORAL | 0 refills | Status: DC | PRN

## 2019-07-29 NOTE — Telephone Encounter (Signed)
Patient called. She would like a refill on her pain medication. Her call back number is [phone]

## 2019-07-29 NOTE — Therapy (Signed)
[place], [name], [address] Phone: [phone]   Fax:  [phone]  Occupational Therapy Treatment  Patient Details  Name: Deborah Mullins MRN: [id number] Date of Birth: 06-18-1951 Referring Provider (OT): Dr. Alphonzo Severance    Encounter Date: 07/29/2019  OT End of Session - 07/29/19 1155    Visit Number  9    Number of Visits  16    Date for OT Re-Evaluation  08/23/19    Authorization Type  Medicare and [employer]    Authorization Time Period  10th visit progress note due on visit 17    Authorization - Visit Number  9    Authorization - Number of Visits  17    OT Start Time  1118    OT Stop Time  1202    OT Time Calculation (min)  44 min    Activity Tolerance  Patient tolerated treatment well    Behavior During Therapy  [facility] for tasks assessed/performed       Past Medical History:  Diagnosis Date  . Allergic rhinitis   . Anxiety   . Arthritis   . Bipolar 1 disorder ([place])   . Breast cancer ([place]) 2004  . Carpal tunnel syndrome of left wrist   . Chronic neck pain   . Chronic shoulder pain    L>R  . Depression   . GERD (gastroesophageal reflux disease)   . Headache(784.0)   . History of lung surgery   . Hyperlipidemia   . Hypertension   . Lung cancer ([place])   . Lung nodule 2009  . Osteoarthritis of right shoulder   . Pain in both feet   . Personal history of radiation therapy 2004  . PONV (postoperative nausea and vomiting)   . Type 2 diabetes mellitus ([place])     Past Surgical History:  Procedure Laterality Date  . BREAST SURGERY  2005   lumpectomy - left  . carpel tunnel Bilateral   . CESAREAN SECTION  1971  . COLONOSCOPY WITH PROPOFOL N/A 07/25/2016   Procedure: COLONOSCOPY WITH PROPOFOL;  Surgeon: Danie Binder, MD;  [facility];  Service: Endoscopy;  Laterality: N/A;  8:15 AM  . EYE SURGERY    . LUNG CANCER SURGERY Right 05/23/2012   reportedly clear  . TONSILLECTOMY    . TOTAL  SHOULDER ARTHROPLASTY Left 08/27/2018   Procedure: LEFT TOTAL SHOULDER ARTHROPLASTY;  Surgeon: Meredith Pel, MD;  Location: [place];  Service: Orthopedics;  Laterality: Left;  . TOTAL SHOULDER ARTHROPLASTY Right 04/15/2019   Procedure: RIGHT TOTAL SHOULDER ARTHROPLASTY;  Surgeon: Meredith Pel, MD;  Location: [place];  Service: Orthopedics;  Laterality: Right;  . TUBAL LIGATION      There were no vitals filed for this visit.  Subjective Assessment - 07/29/19 1118    Subjective   S: It's been doing pretty good.    Currently in Pain?  No/denies         [facility] OT Assessment - 07/29/19 1118      Assessment   Medical Diagnosis  S/P Right Reverse Total Shoulder Replacement      Precautions   Precautions  Shoulder    Type of Shoulder Precautions  progress as tolerated                OT Treatments/Exercises (OP) - 07/29/19 1120      Exercises   Exercises  Shoulder      Shoulder Exercises: Supine  Protraction  PROM;5 reps;Strengthening;12 reps    Protraction Weight (lbs)  1    Horizontal ABduction  PROM;5 reps;Strengthening;12 reps    Horizontal ABduction Weight (lbs)  1    External Rotation  PROM;5 reps;Strengthening;12 reps    External Rotation Weight (lbs)  1    Internal Rotation  PROM;5 reps;Strengthening;12 reps    Internal Rotation Weight (lbs)  1    Flexion  PROM;5 reps;Strengthening;12 reps    Shoulder Flexion Weight (lbs)  1    ABduction  PROM;5 reps;Strengthening;12 reps    Shoulder ABduction Weight (lbs)  1      Shoulder Exercises: Standing   Protraction  Strengthening;10 reps    Protraction Weight (lbs)  1    Horizontal ABduction  Strengthening;10 reps    Horizontal ABduction Weight (lbs)  1    External Rotation  Strengthening;10 reps    External Rotation Weight (lbs)  1    Internal Rotation  Strengthening;10 reps    Internal Rotation Weight (lbs)  1    Flexion  Strengthening;10 reps    Shoulder Flexion Weight (lbs)  1    ABduction   Strengthening;10 reps    Shoulder ABduction Weight (lbs)  1      Shoulder Exercises: ROM/Strengthening   UBE (Upper Arm Bike)  Level 1 2' forward   pace: 7.0   Proximal Shoulder Strengthening, Supine  12X with 1# no rest breaks    Proximal Shoulder Strengthening, Seated  10X, 1#, 2 rest breaks    Ball on Wall  1' flexion      Manual Therapy   Manual Therapy  Myofascial release    Manual therapy comments  manual therapy completed seperately from all other interventions    Myofascial Release  myofascial release and manual stretching to decrease pain and fascial restrictions and improve pain free mobility in right shoulder region                OT Short Term Goals - 07/25/19 1109      OT SHORT TERM GOAL #1   Title  patient will be educated and independent with a HEP for improved right shoulder mobility.    Time  4    Period  Weeks    Target Date  07/22/19      OT SHORT TERM GOAL #2   Title  Patient will improve right shoulder P/ROM to [facility] for improved ability to don and doff shirts.    Time  4    Period  Weeks      OT SHORT TERM GOAL #3   Title  Patient will improve right shoulder strength to 4/5 or stronger for improved ability to lift bags of groceries and laundry baskets.    Time  4    Period  Weeks      OT SHORT TERM GOAL #4   Title  Patient will decrease pain in her right shoulder to 5/10 or better during adl completion.    Time  4    Period  Weeks    Status  Partially Met      OT SHORT TERM GOAL #5   Title  Patient will decrease right shoulder fascial restrictions from mod to min for improved mobility needed for adl completion.    Time  4    Period  Weeks        OT Long Term Goals - 06/27/19 1511      OT LONG TERM GOAL #1   Title  Patient will  return to prior level of independence with all b/iadls and leisure activities, using her right arm as dominant.    Time  8    Period  Weeks    Status  On-going      OT LONG TERM GOAL #2   Title  Patient will  imrpove right shoulder a/rom to WNL for improved ability to reach into overhead cabinets, comb her hair, and reach behind her back to pull up pants.    Time  8    Period  Weeks    Status  On-going      OT LONG TERM GOAL #3   Title  Patient will improve right shoulder strength to 4+/5 or better for improved ability to lift pots and pans when cooking.    Time  8    Period  Weeks    Status  On-going      OT LONG TERM GOAL #4   Title  Patient will decrease pain in right shoulder to 3/10 or better when completing functional tasks.    Time  8    Period  Weeks    Status  On-going      OT LONG TERM GOAL #5   Title  Patient will decrease fascial restrictions to min-trace or better for improved mobility needed for adl completion.    Time  8    Period  Weeks    Status  On-going            Plan - 07/29/19 1135    Clinical Impression Statement  A: Pt reports exercises at home are going well, she was able to unload the dishwasher and put dishes on top shelf today. Continued with manual techniques to address fascial restrictions. Continued with supine and standing strengthening using 1#, added ball on wall to work on scapular stability today. Verbal cuing for form and technique, rest breaks provided as needed.    Body Structure / Function / Physical Skills  ADL;Muscle spasms;Scar mobility;UE functional use;Fascial restriction;Pain;Strength;ROM    Plan  P: add ball on wall in abduction, x to v arms       Patient will benefit from skilled therapeutic intervention in order to improve the following deficits and impairments:   Body Structure / Function / Physical Skills: ADL, Muscle spasms, Scar mobility, UE functional use, Fascial restriction, Pain, Strength, ROM       Visit Diagnosis: Stiffness of right shoulder, not elsewhere classified  Other symptoms and signs involving the musculoskeletal system  Acute pain of right shoulder    Problem List Patient Active Problem List    Diagnosis Date Noted  . OA (osteoarthritis) of shoulder 04/15/2019  . Shoulder arthritis 08/27/2018  . Primary osteoarthritis, left shoulder 04/01/2018  . Primary osteoarthritis, right shoulder 04/01/2018  . Essential hypertension 01/22/2017  . Controlled type 2 diabetes mellitus without complication, without long-term current use of insulin ([place]) 11/23/2016  . Chronic pain of both shoulders 07/24/2016  . Prediabetes   . Anxiety   . Bipolar 1 disorder ([place])   . Depression   . Chronic back pain 11/07/2012  . Insomnia secondary to depression with anxiety 09/23/2012  . Unspecified vitamin D deficiency 09/23/2012  . COPD, mild ([place]) 07/01/2012  . Acute bronchitis 06/08/2012  . Adenocarcinoma of lung, stage 1 ([place]) 05/23/2012  . RECTAL BLEEDING 04/27/2010  . HEMORRHOIDS 03/11/2009  . ADENOCARCINOMA, BREAST 12/15/2008  . FIBROIDS, UTERUS 12/15/2008  . HYPERCHOLESTEROLEMIA 12/15/2008  . Hyperlipidemia 12/15/2008  . Anxiety and depression 12/15/2008  .  GASTROESOPHAGEAL REFLUX DISEASE 12/15/2008  . GASTRITIS 12/15/2008  . CONSTIPATION 12/15/2008  . FLATULENCE ERUCTATION AND GAS PAIN 12/15/2008  . ABDOMINAL PAIN 12/15/2008  . RECTAL BLEEDING, HX OF 12/15/2008   Guadelupe Sabin, OTR/L  [phone] 07/29/2019, 12:03 PM  [facility] [address] [place], [name], [address] Phone: [phone]   Fax:  [phone]  Name: SHAYONNA OCAMPO MRN: [id number] Date of Birth: January 16, 1951

## 2019-07-29 NOTE — Telephone Encounter (Signed)
I called advised patient done.

## 2019-07-29 NOTE — Telephone Encounter (Signed)
Please advise. Thanks.  

## 2019-08-01 ENCOUNTER — Ambulatory Visit (HOSPITAL_COMMUNITY): Payer: Medicare Other

## 2019-08-01 ENCOUNTER — Encounter (HOSPITAL_COMMUNITY): Payer: Self-pay

## 2019-08-01 ENCOUNTER — Other Ambulatory Visit: Payer: Self-pay

## 2019-08-01 DIAGNOSIS — M25611 Stiffness of right shoulder, not elsewhere classified: Secondary | ICD-10-CM

## 2019-08-01 DIAGNOSIS — R29898 Other symptoms and signs involving the musculoskeletal system: Secondary | ICD-10-CM

## 2019-08-01 DIAGNOSIS — M25511 Pain in right shoulder: Secondary | ICD-10-CM

## 2019-08-01 NOTE — Therapy (Signed)
[place], [name], [address] Phone: [phone]   Fax:  [phone]  Occupational Therapy Treatment  Patient Details  Name: Deborah Mullins MRN: [id number] Date of Birth: 1950/12/30 Referring Provider (OT): Dr. Alphonzo Severance    Encounter Date: 08/01/2019  OT End of Session - 08/01/19 1106    Visit Number  10    Number of Visits  16    Date for OT Re-Evaluation  08/23/19    Authorization Type  Medicare and [employer]    Authorization Time Period  10th visit progress note due on visit 17    Authorization - Visit Number  10    Authorization - Number of Visits  17    OT Start Time  1030    OT Stop Time  1108    OT Time Calculation (min)  38 min    Activity Tolerance  Patient tolerated treatment well    Behavior During Therapy  [facility] for tasks assessed/performed       Past Medical History:  Diagnosis Date  . Allergic rhinitis   . Anxiety   . Arthritis   . Bipolar 1 disorder ([place])   . Breast cancer ([place]) 2004  . Carpal tunnel syndrome of left wrist   . Chronic neck pain   . Chronic shoulder pain    L>R  . Depression   . GERD (gastroesophageal reflux disease)   . Headache(784.0)   . History of lung surgery   . Hyperlipidemia   . Hypertension   . Lung cancer ([place])   . Lung nodule 2009  . Osteoarthritis of right shoulder   . Pain in both feet   . Personal history of radiation therapy 2004  . PONV (postoperative nausea and vomiting)   . Type 2 diabetes mellitus ([place])     Past Surgical History:  Procedure Laterality Date  . BREAST SURGERY  2005   lumpectomy - left  . carpel tunnel Bilateral   . CESAREAN SECTION  1971  . COLONOSCOPY WITH PROPOFOL N/A 07/25/2016   Procedure: COLONOSCOPY WITH PROPOFOL;  Surgeon: Danie Binder, MD;  [facility];  Service: Endoscopy;  Laterality: N/A;  8:15 AM  . EYE SURGERY    . LUNG CANCER SURGERY Right 05/23/2012   reportedly clear  . TONSILLECTOMY    . TOTAL  SHOULDER ARTHROPLASTY Left 08/27/2018   Procedure: LEFT TOTAL SHOULDER ARTHROPLASTY;  Surgeon: Meredith Pel, MD;  Location: [place];  Service: Orthopedics;  Laterality: Left;  . TOTAL SHOULDER ARTHROPLASTY Right 04/15/2019   Procedure: RIGHT TOTAL SHOULDER ARTHROPLASTY;  Surgeon: Meredith Pel, MD;  Location: [place];  Service: Orthopedics;  Laterality: Right;  . TUBAL LIGATION      There were no vitals filed for this visit.  Subjective Assessment - 08/01/19 1055    Subjective   S: I have soreness sometimes. It's not too bad today.    Currently in Pain?  No/denies         [facility] OT Assessment - 08/01/19 1056      Assessment   Medical Diagnosis  S/P Right Reverse Total Shoulder Replacement      Precautions   Precautions  Shoulder    Type of Shoulder Precautions  progress as tolerated                OT Treatments/Exercises (OP) - 08/01/19 1056      Exercises   Exercises  Shoulder  Shoulder Exercises: Supine   Protraction  PROM;5 reps;Strengthening;10 reps    Protraction Weight (lbs)  2    Horizontal ABduction  PROM;5 reps;Strengthening;10 reps    Horizontal ABduction Weight (lbs)  2    External Rotation  PROM;5 reps;Strengthening;10 reps   slightly abducted   External Rotation Weight (lbs)  2    Internal Rotation  PROM;5 reps;Strengthening;10 reps   slightly abducted   Internal Rotation Weight (lbs)  2    Flexion  PROM;5 reps;Strengthening;10 reps    Shoulder Flexion Weight (lbs)  2    ABduction  PROM;5 reps;Strengthening;12 reps    Shoulder ABduction Weight (lbs)  1      Shoulder Exercises: ROM/Strengthening   Over Head Lace  seated 2'    X to V Arms  10X A/ROM    Diona Foley on Wall  1' flexion 1' abduction       Manual Therapy   Manual Therapy  Myofascial release    Manual therapy comments  manual therapy completed seperately from all other interventions    Myofascial Release  myofascial release and manual stretching to decrease pain and fascial  restrictions and improve pain free mobility in right shoulder region              OT Education - 08/01/19 1209    Education Details  Pt educated on external rotation stretch using table and star gazer stretch. Complete twice a day. Hold for 30 seconds.    Person(s) Educated  Patient    Methods  Explanation;Demonstration    Comprehension  Verbalized understanding;Returned demonstration       OT Short Term Goals - 07/25/19 1109      OT SHORT TERM GOAL #1   Title  patient will be educated and independent with a HEP for improved right shoulder mobility.    Time  4    Period  Weeks    Target Date  07/22/19      OT SHORT TERM GOAL #2   Title  Patient will improve right shoulder P/ROM to [facility] for improved ability to don and doff shirts.    Time  4    Period  Weeks      OT SHORT TERM GOAL #3   Title  Patient will improve right shoulder strength to 4/5 or stronger for improved ability to lift bags of groceries and laundry baskets.    Time  4    Period  Weeks      OT SHORT TERM GOAL #4   Title  Patient will decrease pain in her right shoulder to 5/10 or better during adl completion.    Time  4    Period  Weeks    Status  Partially Met      OT SHORT TERM GOAL #5   Title  Patient will decrease right shoulder fascial restrictions from mod to min for improved mobility needed for adl completion.    Time  4    Period  Weeks        OT Long Term Goals - 06/27/19 1511      OT LONG TERM GOAL #1   Title  Patient will return to prior level of independence with all b/iadls and leisure activities, using her right arm as dominant.    Time  8    Period  Weeks    Status  On-going      OT LONG TERM GOAL #2   Title  Patient will imrpove right shoulder a/rom to WNL  for improved ability to reach into overhead cabinets, comb her hair, and reach behind her back to pull up pants.    Time  8    Period  Weeks    Status  On-going      OT LONG TERM GOAL #3   Title  Patient will improve  right shoulder strength to 4+/5 or better for improved ability to lift pots and pans when cooking.    Time  8    Period  Weeks    Status  On-going      OT LONG TERM GOAL #4   Title  Patient will decrease pain in right shoulder to 3/10 or better when completing functional tasks.    Time  8    Period  Weeks    Status  On-going      OT LONG TERM GOAL #5   Title  Patient will decrease fascial restrictions to min-trace or better for improved mobility needed for adl completion.    Time  8    Period  Weeks    Status  On-going            Plan - 08/01/19 1204    Clinical Impression Statement  A: Patient unable to tolerate passive stretching past initial muscle tighness end feel. Did provide joint distraction while completely passive IR/er and abduction which did provide some relief and increased comfort. Manual techniques were completed to address fascial restrictions. It was noted that patient presents with trace amount of fascial restrictions. VC were provided during session for form and technique. Added overhead lacing and ball on the wall in abduction to focus on shoulder strength/endurance.    Body Structure / Function / Physical Skills  ADL;Muscle spasms;Scar mobility;UE functional use;Fascial restriction;Pain;Strength;ROM    Plan  P: Continue to progress scapular strength and shoulder stability exercises as able. Attempt therapy ball strengthening exercises.    Consulted and Agree with Plan of Care  Patient       Patient will benefit from skilled therapeutic intervention in order to improve the following deficits and impairments:   Body Structure / Function / Physical Skills: ADL, Muscle spasms, Scar mobility, UE functional use, Fascial restriction, Pain, Strength, ROM       Visit Diagnosis: Acute pain of right shoulder  Other symptoms and signs involving the musculoskeletal system  Stiffness of right shoulder, not elsewhere classified    Problem List Patient Active  Problem List   Diagnosis Date Noted  . OA (osteoarthritis) of shoulder 04/15/2019  . Shoulder arthritis 08/27/2018  . Primary osteoarthritis, left shoulder 04/01/2018  . Primary osteoarthritis, right shoulder 04/01/2018  . Essential hypertension 01/22/2017  . Controlled type 2 diabetes mellitus without complication, without long-term current use of insulin ([place]) 11/23/2016  . Chronic pain of both shoulders 07/24/2016  . Prediabetes   . Anxiety   . Bipolar 1 disorder ([place])   . Depression   . Chronic back pain 11/07/2012  . Insomnia secondary to depression with anxiety 09/23/2012  . Unspecified vitamin D deficiency 09/23/2012  . COPD, mild ([place]) 07/01/2012  . Acute bronchitis 06/08/2012  . Adenocarcinoma of lung, stage 1 ([place]) 05/23/2012  . RECTAL BLEEDING 04/27/2010  . HEMORRHOIDS 03/11/2009  . ADENOCARCINOMA, BREAST 12/15/2008  . FIBROIDS, UTERUS 12/15/2008  . HYPERCHOLESTEROLEMIA 12/15/2008  . Hyperlipidemia 12/15/2008  . Anxiety and depression 12/15/2008  . GASTROESOPHAGEAL REFLUX DISEASE 12/15/2008  . GASTRITIS 12/15/2008  . CONSTIPATION 12/15/2008  . FLATULENCE ERUCTATION AND GAS PAIN 12/15/2008  . ABDOMINAL PAIN 12/15/2008  .  RECTAL BLEEDING, HX OF 12/15/2008   Ailene Ravel, OTR/L,CBIS  [phone]  08/01/2019, 12:11 PM  [facility] [address] [place], [name], [address] Phone: [phone]   Fax:  [phone]  Name: Deborah Mullins MRN: [id number] Date of Birth: 12/29/1950

## 2019-08-05 ENCOUNTER — Other Ambulatory Visit: Payer: Self-pay

## 2019-08-05 ENCOUNTER — Encounter (HOSPITAL_COMMUNITY): Payer: Self-pay | Admitting: Occupational Therapy

## 2019-08-05 ENCOUNTER — Ambulatory Visit (HOSPITAL_COMMUNITY): Payer: Medicare Other | Admitting: Occupational Therapy

## 2019-08-05 DIAGNOSIS — M25511 Pain in right shoulder: Secondary | ICD-10-CM | POA: Diagnosis not present

## 2019-08-05 DIAGNOSIS — M25611 Stiffness of right shoulder, not elsewhere classified: Secondary | ICD-10-CM

## 2019-08-05 DIAGNOSIS — R29898 Other symptoms and signs involving the musculoskeletal system: Secondary | ICD-10-CM

## 2019-08-05 NOTE — Therapy (Signed)
[place], [name], [address] Phone: [phone]   Fax:  [phone]  Occupational Therapy Treatment  Patient Details  Name: Deborah Mullins MRN: [id number] Date of Birth: Feb 02, 1951 Referring Provider (OT): Dr. Alphonzo Severance    Encounter Date: 08/05/2019  OT End of Session - 08/05/19 1155    Visit Number  11    Number of Visits  16    Date for OT Re-Evaluation  08/23/19    Authorization Type  Medicare and [employer]    Authorization Time Period  10th visit progress note due on visit 17    Authorization - Visit Number  11    Authorization - Number of Visits  17    OT Start Time  1117    OT Stop Time  1158    OT Time Calculation (min)  41 min    Activity Tolerance  Patient tolerated treatment well    Behavior During Therapy  [facility] for tasks assessed/performed       Past Medical History:  Diagnosis Date  . Allergic rhinitis   . Anxiety   . Arthritis   . Bipolar 1 disorder ([place])   . Breast cancer ([place]) 2004  . Carpal tunnel syndrome of left wrist   . Chronic neck pain   . Chronic shoulder pain    L>R  . Depression   . GERD (gastroesophageal reflux disease)   . Headache(784.0)   . History of lung surgery   . Hyperlipidemia   . Hypertension   . Lung cancer ([place])   . Lung nodule 2009  . Osteoarthritis of right shoulder   . Pain in both feet   . Personal history of radiation therapy 2004  . PONV (postoperative nausea and vomiting)   . Type 2 diabetes mellitus ([place])     Past Surgical History:  Procedure Laterality Date  . BREAST SURGERY  2005   lumpectomy - left  . carpel tunnel Bilateral   . CESAREAN SECTION  1971  . COLONOSCOPY WITH PROPOFOL N/A 07/25/2016   Procedure: COLONOSCOPY WITH PROPOFOL;  Surgeon: Danie Binder, MD;  [facility];  Service: Endoscopy;  Laterality: N/A;  8:15 AM  . EYE SURGERY    . LUNG CANCER SURGERY Right 05/23/2012   reportedly clear  . TONSILLECTOMY    . TOTAL  SHOULDER ARTHROPLASTY Left 08/27/2018   Procedure: LEFT TOTAL SHOULDER ARTHROPLASTY;  Surgeon: Meredith Pel, MD;  Location: [place];  Service: Orthopedics;  Laterality: Left;  . TOTAL SHOULDER ARTHROPLASTY Right 04/15/2019   Procedure: RIGHT TOTAL SHOULDER ARTHROPLASTY;  Surgeon: Meredith Pel, MD;  Location: [place];  Service: Orthopedics;  Laterality: Right;  . TUBAL LIGATION      There were no vitals filed for this visit.  Subjective Assessment - 08/05/19 1111    Subjective   S:    Currently in Pain?  No/denies         [facility] OT Assessment - 08/05/19 1110      Assessment   Medical Diagnosis  S/P Right Reverse Total Shoulder Replacement    Referring Provider (OT)  Dr. Alphonzo Severance       Precautions   Precautions  Shoulder    Type of Shoulder Precautions  progress as tolerated                OT Treatments/Exercises (OP) - 08/05/19 1131      Exercises   Exercises  Shoulder  Shoulder Exercises: Supine   Protraction  PROM;5 reps;Strengthening;10 reps    Protraction Weight (lbs)  2    Horizontal ABduction  PROM;5 reps;Strengthening;10 reps    Horizontal ABduction Weight (lbs)  2    External Rotation  PROM;5 reps;Strengthening;10 reps    External Rotation Weight (lbs)  2    Internal Rotation  PROM;5 reps;Strengthening;10 reps    Internal Rotation Weight (lbs)  2    Flexion  PROM;5 reps;Strengthening;10 reps    Shoulder Flexion Weight (lbs)  2    ABduction  PROM;5 reps;Strengthening;10 reps    Shoulder ABduction Weight (lbs)  1      Shoulder Exercises: Standing   Protraction  Strengthening;12 reps    Protraction Weight (lbs)  1    Horizontal ABduction  Strengthening;12 reps    Horizontal ABduction Weight (lbs)  1    External Rotation  Strengthening;12 reps    External Rotation Weight (lbs)  1    Internal Rotation  Strengthening;12 reps    Internal Rotation Weight (lbs)  1    Flexion  Strengthening;12 reps    Shoulder Flexion Weight (lbs)  1     ABduction  Strengthening;12 reps    Shoulder ABduction Weight (lbs)  1      Shoulder Exercises: Therapy Ball   Other Therapy Ball Exercises  dark green therapy ball: chest press, flexion, circles each direction, 10X each      Shoulder Exercises: ROM/Strengthening   UBE (Upper Arm Bike)  Level 2 2' forward 2' reverse   pace: 8.0-10.0   X to V Arms  10X, 1#    Proximal Shoulder Strengthening, Supine  12X with 2# no rest breaks    Ball on Wall  1' flexion 1' abduction                OT Short Term Goals - 07/25/19 1109      OT SHORT TERM GOAL #1   Title  patient will be educated and independent with a HEP for improved right shoulder mobility.    Time  4    Period  Weeks    Target Date  07/22/19      OT SHORT TERM GOAL #2   Title  Patient will improve right shoulder P/ROM to [facility] for improved ability to don and doff shirts.    Time  4    Period  Weeks      OT SHORT TERM GOAL #3   Title  Patient will improve right shoulder strength to 4/5 or stronger for improved ability to lift bags of groceries and laundry baskets.    Time  4    Period  Weeks      OT SHORT TERM GOAL #4   Title  Patient will decrease pain in her right shoulder to 5/10 or better during adl completion.    Time  4    Period  Weeks    Status  Partially Met      OT SHORT TERM GOAL #5   Title  Patient will decrease right shoulder fascial restrictions from mod to min for improved mobility needed for adl completion.    Time  4    Period  Weeks        OT Long Term Goals - 06/27/19 1511      OT LONG TERM GOAL #1   Title  Patient will return to prior level of independence with all b/iadls and leisure activities, using her right arm as dominant.  Time  8    Period  Weeks    Status  On-going      OT LONG TERM GOAL #2   Title  Patient will imrpove right shoulder a/rom to WNL for improved ability to reach into overhead cabinets, comb her hair, and reach behind her back to pull up pants.    Time  8     Period  Weeks    Status  On-going      OT LONG TERM GOAL #3   Title  Patient will improve right shoulder strength to 4+/5 or better for improved ability to lift pots and pans when cooking.    Time  8    Period  Weeks    Status  On-going      OT LONG TERM GOAL #4   Title  Patient will decrease pain in right shoulder to 3/10 or better when completing functional tasks.    Time  8    Period  Weeks    Status  On-going      OT LONG TERM GOAL #5   Title  Patient will decrease fascial restrictions to min-trace or better for improved mobility needed for adl completion.    Time  8    Period  Weeks    Status  On-going            Plan - 08/05/19 1134    Clinical Impression Statement  A: No manual therapy completed this session as pt with minimal fascial restrictions in RUE. Improved ability to tolerate passive stretching today, occasional catching with abduction which resolved with distraction. Continued with 2# weights in supine, 1# weights in standing. Added therapy ball in standing for shoulder strengthening and stability. Verbal cuing for form and technique.    Body Structure / Function / Physical Skills  ADL;Muscle spasms;Scar mobility;UE functional use;Fascial restriction;Pain;Strength;ROM    Plan  P: Continue with shoulder strengthening, add red loop band exercises       Patient will benefit from skilled therapeutic intervention in order to improve the following deficits and impairments:   Body Structure / Function / Physical Skills: ADL, Muscle spasms, Scar mobility, UE functional use, Fascial restriction, Pain, Strength, ROM       Visit Diagnosis: Acute pain of right shoulder  Other symptoms and signs involving the musculoskeletal system  Stiffness of right shoulder, not elsewhere classified    Problem List Patient Active Problem List   Diagnosis Date Noted  . OA (osteoarthritis) of shoulder 04/15/2019  . Shoulder arthritis 08/27/2018  . Primary osteoarthritis,  left shoulder 04/01/2018  . Primary osteoarthritis, right shoulder 04/01/2018  . Essential hypertension 01/22/2017  . Controlled type 2 diabetes mellitus without complication, without long-term current use of insulin ([place]) 11/23/2016  . Chronic pain of both shoulders 07/24/2016  . Prediabetes   . Anxiety   . Bipolar 1 disorder ([place])   . Depression   . Chronic back pain 11/07/2012  . Insomnia secondary to depression with anxiety 09/23/2012  . Unspecified vitamin D deficiency 09/23/2012  . COPD, mild ([place]) 07/01/2012  . Acute bronchitis 06/08/2012  . Adenocarcinoma of lung, stage 1 ([place]) 05/23/2012  . RECTAL BLEEDING 04/27/2010  . HEMORRHOIDS 03/11/2009  . ADENOCARCINOMA, BREAST 12/15/2008  . FIBROIDS, UTERUS 12/15/2008  . HYPERCHOLESTEROLEMIA 12/15/2008  . Hyperlipidemia 12/15/2008  . Anxiety and depression 12/15/2008  . GASTROESOPHAGEAL REFLUX DISEASE 12/15/2008  . GASTRITIS 12/15/2008  . CONSTIPATION 12/15/2008  . FLATULENCE ERUCTATION AND GAS PAIN 12/15/2008  . ABDOMINAL PAIN 12/15/2008  .  RECTAL BLEEDING, HX OF 12/15/2008   Guadelupe Sabin, OTR/L  [phone] 08/05/2019, 11:58 AM  [facility] [address] [place], [name], [address] Phone: [phone]   Fax:  [phone]  Name: SOLARIS KRAM MRN: [id number] Date of Birth: [birth year]

## 2019-08-07 ENCOUNTER — Ambulatory Visit (HOSPITAL_COMMUNITY): Payer: Medicare Other | Admitting: Specialist

## 2019-08-07 ENCOUNTER — Encounter (HOSPITAL_COMMUNITY): Payer: Self-pay | Admitting: Specialist

## 2019-08-07 ENCOUNTER — Other Ambulatory Visit: Payer: Self-pay

## 2019-08-07 DIAGNOSIS — M25511 Pain in right shoulder: Secondary | ICD-10-CM | POA: Diagnosis not present

## 2019-08-07 DIAGNOSIS — R29898 Other symptoms and signs involving the musculoskeletal system: Secondary | ICD-10-CM

## 2019-08-07 DIAGNOSIS — M25611 Stiffness of right shoulder, not elsewhere classified: Secondary | ICD-10-CM

## 2019-08-07 NOTE — Therapy (Signed)
[place], [name], [address] Phone: [phone]   Fax:  [phone]  Occupational Therapy Treatment  Patient Details  Name: Deborah Mullins MRN: [id number] Date of Birth: May 16, 1951 Referring Provider (OT): Dr. Alphonzo Severance    Encounter Date: 08/07/2019  OT End of Session - 08/07/19 1156    Visit Number  12    Number of Visits  16    Date for OT Re-Evaluation  08/23/19    Authorization Type  Medicare and [employer]    Authorization Time Period  10th visit progress note due on visit 17    Authorization - Visit Number  12    Authorization - Number of Visits  53    OT Start Time  1130    OT Stop Time  1206   patient arrived late for appt   OT Time Calculation (min)  36 min    Activity Tolerance  Patient tolerated treatment well       Past Medical History:  Diagnosis Date  . Allergic rhinitis   . Anxiety   . Arthritis   . Bipolar 1 disorder ([place])   . Breast cancer ([place]) 2004  . Carpal tunnel syndrome of left wrist   . Chronic neck pain   . Chronic shoulder pain    L>R  . Depression   . GERD (gastroesophageal reflux disease)   . Headache(784.0)   . History of lung surgery   . Hyperlipidemia   . Hypertension   . Lung cancer ([place])   . Lung nodule 2009  . Osteoarthritis of right shoulder   . Pain in both feet   . Personal history of radiation therapy 2004  . PONV (postoperative nausea and vomiting)   . Type 2 diabetes mellitus ([place])     Past Surgical History:  Procedure Laterality Date  . BREAST SURGERY  2005   lumpectomy - left  . carpel tunnel Bilateral   . CESAREAN SECTION  1971  . COLONOSCOPY WITH PROPOFOL N/A 07/25/2016   Procedure: COLONOSCOPY WITH PROPOFOL;  Surgeon: Danie Binder, MD;  [facility];  Service: Endoscopy;  Laterality: N/A;  8:15 AM  . EYE SURGERY    . LUNG CANCER SURGERY Right 05/23/2012   reportedly clear  . TONSILLECTOMY    . TOTAL SHOULDER ARTHROPLASTY Left  08/27/2018   Procedure: LEFT TOTAL SHOULDER ARTHROPLASTY;  Surgeon: Meredith Pel, MD;  Location: [place];  Service: Orthopedics;  Laterality: Left;  . TOTAL SHOULDER ARTHROPLASTY Right 04/15/2019   Procedure: RIGHT TOTAL SHOULDER ARTHROPLASTY;  Surgeon: Meredith Pel, MD;  Location: [place];  Service: Orthopedics;  Laterality: Right;  . TUBAL LIGATION      There were no vitals filed for this visit.  Subjective Assessment - 08/07/19 1155    Subjective   S:  I can tell it gets stronger, it does hurt at night    Currently in Pain?  No/denies                   OT Treatments/Exercises (OP) - 08/07/19 0001      Shoulder Exercises: Supine   Protraction  PROM;5 reps;Strengthening;10 reps    Protraction Weight (lbs)  3    Horizontal ABduction  PROM;5 reps;Strengthening;10 reps    Horizontal ABduction Weight (lbs)  3    External Rotation  PROM;5 reps;Strengthening;10 reps    External Rotation Weight (lbs)  3    Internal Rotation  PROM;5 reps;Strengthening;10 reps  Internal Rotation Weight (lbs)  3    Flexion  PROM;Strengthening;5 reps    Shoulder Flexion Weight (lbs)  3    ABduction  PROM;Strengthening;5 reps    Shoulder ABduction Weight (lbs)  3      Shoulder Exercises: Seated   Protraction  Strengthening;12 reps    Protraction Weight (lbs)  1    Horizontal ABduction  Strengthening;12 reps    Horizontal ABduction Weight (lbs)  1    External Rotation  Strengthening;12 reps    External Rotation Weight (lbs)  1    Internal Rotation  Strengthening;12 reps    Internal Rotation Weight (lbs)  1    Flexion  Strengthening;12 reps    Flexion Weight (lbs)  1    Abduction  Strengthening;12 reps    ABduction Weight (lbs)  1      Shoulder Exercises: ROM/Strengthening   "W" Arms  10 times 1#    X to V Arms  10 times 1#    Proximal Shoulder Strengthening, Supine  10times with 3#    Proximal Shoulder Strengthening, Seated  10 times with 1#    Ball on Wall  1' flexion 1'  abduction     Other ROM/Strengthening Exercises  red loop wall walk up and down 5 times                OT Short Term Goals - 07/25/19 1109      OT SHORT TERM GOAL #1   Title  patient will be educated and independent with a HEP for improved right shoulder mobility.    Time  4    Period  Weeks    Target Date  07/22/19      OT SHORT TERM GOAL #2   Title  Patient will improve right shoulder P/ROM to [facility] for improved ability to don and doff shirts.    Time  4    Period  Weeks      OT SHORT TERM GOAL #3   Title  Patient will improve right shoulder strength to 4/5 or stronger for improved ability to lift bags of groceries and laundry baskets.    Time  4    Period  Weeks      OT SHORT TERM GOAL #4   Title  Patient will decrease pain in her right shoulder to 5/10 or better during adl completion.    Time  4    Period  Weeks    Status  Partially Met      OT SHORT TERM GOAL #5   Title  Patient will decrease right shoulder fascial restrictions from mod to min for improved mobility needed for adl completion.    Time  4    Period  Weeks        OT Long Term Goals - 06/27/19 1511      OT LONG TERM GOAL #1   Title  Patient will return to prior level of independence with all b/iadls and leisure activities, using her right arm as dominant.    Time  8    Period  Weeks    Status  On-going      OT LONG TERM GOAL #2   Title  Patient will imrpove right shoulder a/rom to WNL for improved ability to reach into overhead cabinets, comb her hair, and reach behind her back to pull up pants.    Time  8    Period  Weeks    Status  On-going  OT LONG TERM GOAL #3   Title  Patient will improve right shoulder strength to 4+/5 or better for improved ability to lift pots and pans when cooking.    Time  8    Period  Weeks    Status  On-going      OT LONG TERM GOAL #4   Title  Patient will decrease pain in right shoulder to 3/10 or better when completing functional tasks.    Time  8     Period  Weeks    Status  On-going      OT LONG TERM GOAL #5   Title  Patient will decrease fascial restrictions to min-trace or better for improved mobility needed for adl completion.    Time  8    Period  Weeks    Status  On-going            Plan - 08/07/19 1205    Clinical Impression Statement  A:  Paitent able to increase to 3# with supine strengthening this date.  added red tband loop wall walk, with max vg and tactile cues for technique difficult for patient to maintain traction on band while moving arm.    Body Structure / Function / Physical Skills  ADL;Muscle spasms;Scar mobility;UE functional use;Fascial restriction;Pain;Strength;ROM    Plan  P:  improve proprioceptive awareness and strength needed for red loop band and therapy ball exercises.       Patient will benefit from skilled therapeutic intervention in order to improve the following deficits and impairments:   Body Structure / Function / Physical Skills: ADL, Muscle spasms, Scar mobility, UE functional use, Fascial restriction, Pain, Strength, ROM       Visit Diagnosis: Acute pain of right shoulder  Other symptoms and signs involving the musculoskeletal system  Stiffness of right shoulder, not elsewhere classified    Problem List Patient Active Problem List   Diagnosis Date Noted  . OA (osteoarthritis) of shoulder 04/15/2019  . Shoulder arthritis 08/27/2018  . Primary osteoarthritis, left shoulder 04/01/2018  . Primary osteoarthritis, right shoulder 04/01/2018  . Essential hypertension 01/22/2017  . Controlled type 2 diabetes mellitus without complication, without long-term current use of insulin ([place]) 11/23/2016  . Chronic pain of both shoulders 07/24/2016  . Prediabetes   . Anxiety   . Bipolar 1 disorder ([place])   . Depression   . Chronic back pain 11/07/2012  . Insomnia secondary to depression with anxiety 09/23/2012  . Unspecified vitamin D deficiency 09/23/2012  . COPD, mild ([place]) 07/01/2012   . Acute bronchitis 06/08/2012  . Adenocarcinoma of lung, stage 1 ([place]) 05/23/2012  . RECTAL BLEEDING 04/27/2010  . HEMORRHOIDS 03/11/2009  . ADENOCARCINOMA, BREAST 12/15/2008  . FIBROIDS, UTERUS 12/15/2008  . HYPERCHOLESTEROLEMIA 12/15/2008  . Hyperlipidemia 12/15/2008  . Anxiety and depression 12/15/2008  . GASTROESOPHAGEAL REFLUX DISEASE 12/15/2008  . GASTRITIS 12/15/2008  . CONSTIPATION 12/15/2008  . FLATULENCE ERUCTATION AND GAS PAIN 12/15/2008  . ABDOMINAL PAIN 12/15/2008  . RECTAL BLEEDING, HX OF 12/15/2008    Vangie Bicker, [place], OTR/L [phone]  08/07/2019, 12:08 PM  [place], [name], [address] Phone: [phone]   Fax:  [phone]  Name: LYNLEE STRATTON MRN: [id number] Date of Birth: 1951-02-11

## 2019-08-11 ENCOUNTER — Other Ambulatory Visit: Payer: Self-pay

## 2019-08-11 ENCOUNTER — Encounter (HOSPITAL_COMMUNITY): Payer: Self-pay | Admitting: Occupational Therapy

## 2019-08-11 ENCOUNTER — Ambulatory Visit (HOSPITAL_COMMUNITY): Payer: Medicare Other | Admitting: Occupational Therapy

## 2019-08-11 DIAGNOSIS — M25511 Pain in right shoulder: Secondary | ICD-10-CM

## 2019-08-11 DIAGNOSIS — R29898 Other symptoms and signs involving the musculoskeletal system: Secondary | ICD-10-CM

## 2019-08-11 DIAGNOSIS — M25611 Stiffness of right shoulder, not elsewhere classified: Secondary | ICD-10-CM

## 2019-08-11 NOTE — Therapy (Signed)
[place], [name], [address] Phone: [phone]   Fax:  [phone]  Occupational Therapy Treatment  Patient Details  Name: Deborah Mullins MRN: [id number] Date of Birth: April 11, 1951 Referring Provider (OT): Dr. Alphonzo Severance    Encounter Date: 08/11/2019  OT End of Session - 08/11/19 1204    Visit Number  13    Number of Visits  16    Date for OT Re-Evaluation  08/23/19    Authorization Type  Medicare and [employer]    Authorization Time Period  10th visit progress note due on visit 17    Authorization - Visit Number  13    Authorization - Number of Visits  17    OT Start Time  1118    OT Stop Time  1202    OT Time Calculation (min)  44 min    Activity Tolerance  Patient tolerated treatment well       Past Medical History:  Diagnosis Date  . Allergic rhinitis   . Anxiety   . Arthritis   . Bipolar 1 disorder ([place])   . Breast cancer ([place]) 2004  . Carpal tunnel syndrome of left wrist   . Chronic neck pain   . Chronic shoulder pain    L>R  . Depression   . GERD (gastroesophageal reflux disease)   . Headache(784.0)   . History of lung surgery   . Hyperlipidemia   . Hypertension   . Lung cancer ([place])   . Lung nodule 2009  . Osteoarthritis of right shoulder   . Pain in both feet   . Personal history of radiation therapy 2004  . PONV (postoperative nausea and vomiting)   . Type 2 diabetes mellitus ([place])     Past Surgical History:  Procedure Laterality Date  . BREAST SURGERY  2005   lumpectomy - left  . carpel tunnel Bilateral   . CESAREAN SECTION  1971  . COLONOSCOPY WITH PROPOFOL N/A 07/25/2016   Procedure: COLONOSCOPY WITH PROPOFOL;  Surgeon: Danie Binder, MD;  [facility];  Service: Endoscopy;  Laterality: N/A;  8:15 AM  . EYE SURGERY    . LUNG CANCER SURGERY Right 05/23/2012   reportedly clear  . TONSILLECTOMY    . TOTAL SHOULDER ARTHROPLASTY Left 08/27/2018   Procedure: LEFT TOTAL  SHOULDER ARTHROPLASTY;  Surgeon: Meredith Pel, MD;  Location: [facility];  Service: Orthopedics;  Laterality: Left;  . TOTAL SHOULDER ARTHROPLASTY Right 04/15/2019   Procedure: RIGHT TOTAL SHOULDER ARTHROPLASTY;  Surgeon: Meredith Pel, MD;  Location: [facility];  Service: Orthopedics;  Laterality: Right;  . TUBAL LIGATION      There were no vitals filed for this visit.  Subjective Assessment - 08/11/19 1118    Subjective   S: It's been pretty good.    Currently in Pain?  No/denies         [facility] OT Assessment - 08/11/19 1118      Assessment   Medical Diagnosis  S/P Right Reverse Total Shoulder Replacement      Precautions   Precautions  Shoulder    Type of Shoulder Precautions  progress as tolerated                OT Treatments/Exercises (OP) - 08/11/19 1120      Exercises   Exercises  Shoulder      Shoulder Exercises: Supine   Protraction  PROM;5 reps;Strengthening;10 reps    Protraction Weight (lbs)  3    Horizontal ABduction  PROM;5 reps;Strengthening;10 reps    Horizontal ABduction Weight (lbs)  3    External Rotation  PROM;5 reps;Strengthening;10 reps    External Rotation Weight (lbs)  3    Internal Rotation  PROM;5 reps;Strengthening;10 reps    Internal Rotation Weight (lbs)  3    Flexion  PROM;Strengthening;5 reps    Shoulder Flexion Weight (lbs)  3    ABduction  PROM;Strengthening;5 reps    Shoulder ABduction Weight (lbs)  3      Shoulder Exercises: Standing   Protraction  Strengthening;12 reps    Protraction Weight (lbs)  1    Horizontal ABduction  Strengthening;12 reps    Horizontal ABduction Weight (lbs)  1    External Rotation  Strengthening;12 reps    External Rotation Weight (lbs)  1    Internal Rotation  Strengthening;12 reps    Internal Rotation Weight (lbs)  1    Flexion  Strengthening;12 reps    Shoulder Flexion Weight (lbs)  1    ABduction  Strengthening;12 reps    Shoulder ABduction Weight (lbs)  1      Shoulder Exercises:  Therapy Ball   Other Therapy Ball Exercises  green therapy ball: chest press, flexion, circles each direction, 10X each       Shoulder Exercises: ROM/Strengthening   UBE (Upper Arm Bike)  Level 2 3' reverse   pace: 10.0   Over Head Lace  seated 2' with 1# wrist weight    "W" Arms  10X, 1# max verbal & visual cuing    X to V Arms  12X, 1#    Proximal Shoulder Strengthening, Supine  10X, 3#, no rest breaks    Proximal Shoulder Strengthening, Seated  12X, 1#, 1 rest break    Other ROM/Strengthening Exercises  red loop band: wall walks 5X, lateral wall slides 10X    Other ROM/Strengthening Exercises  Y lift off, 10X               OT Short Term Goals - 07/25/19 1109      OT SHORT TERM GOAL #1   Title  patient will be educated and independent with a HEP for improved right shoulder mobility.    Time  4    Period  Weeks    Target Date  07/22/19      OT SHORT TERM GOAL #2   Title  Patient will improve right shoulder P/ROM to [facility] for improved ability to don and doff shirts.    Time  4    Period  Weeks      OT SHORT TERM GOAL #3   Title  Patient will improve right shoulder strength to 4/5 or stronger for improved ability to lift bags of groceries and laundry baskets.    Time  4    Period  Weeks      OT SHORT TERM GOAL #4   Title  Patient will decrease pain in her right shoulder to 5/10 or better during adl completion.    Time  4    Period  Weeks    Status  Partially Met      OT SHORT TERM GOAL #5   Title  Patient will decrease right shoulder fascial restrictions from mod to min for improved mobility needed for adl completion.    Time  4    Period  Weeks        OT Long Term Goals - 06/27/19 1511  OT LONG TERM GOAL #1   Title  Patient will return to prior level of independence with all b/iadls and leisure activities, using her right arm as dominant.    Time  8    Period  Weeks    Status  On-going      OT LONG TERM GOAL #2   Title  Patient will imrpove right  shoulder a/rom to WNL for improved ability to reach into overhead cabinets, comb her hair, and reach behind her back to pull up pants.    Time  8    Period  Weeks    Status  On-going      OT LONG TERM GOAL #3   Title  Patient will improve right shoulder strength to 4+/5 or better for improved ability to lift pots and pans when cooking.    Time  8    Period  Weeks    Status  On-going      OT LONG TERM GOAL #4   Title  Patient will decrease pain in right shoulder to 3/10 or better when completing functional tasks.    Time  8    Period  Weeks    Status  On-going      OT LONG TERM GOAL #5   Title  Patient will decrease fascial restrictions to min-trace or better for improved mobility needed for adl completion.    Time  8    Period  Weeks    Status  On-going            Plan - 08/11/19 1152    Clinical Impression Statement  A: No manual therapy completed this session, exercises focusing on RUE strengthening and stability. Continued with 3# and 1# hand weights, green therapy ball exercises, and red loop band. Added x to v arms. Pt requiring verbal and tactile cuing for red loop band and x to v arms today. Added wrist weight to overhead lacing. Verbal cuing for form and technique.    Body Structure / Function / Physical Skills  ADL;Muscle spasms;Scar mobility;UE functional use;Fascial restriction;Pain;Strength;ROM    Plan  P: manual therapy prn. Continue with shoulder strengthening and stability, improve form and independence with Y lift off and red loop band exercises. Complete sidelying versus supine strengthening       Patient will benefit from skilled therapeutic intervention in order to improve the following deficits and impairments:   Body Structure / Function / Physical Skills: ADL, Muscle spasms, Scar mobility, UE functional use, Fascial restriction, Pain, Strength, ROM       Visit Diagnosis: Acute pain of right shoulder  Other symptoms and signs involving the  musculoskeletal system  Stiffness of right shoulder, not elsewhere classified    Problem List Patient Active Problem List   Diagnosis Date Noted  . OA (osteoarthritis) of shoulder 04/15/2019  . Shoulder arthritis 08/27/2018  . Primary osteoarthritis, left shoulder 04/01/2018  . Primary osteoarthritis, right shoulder 04/01/2018  . Essential hypertension 01/22/2017  . Controlled type 2 diabetes mellitus without complication, without long-term current use of insulin ([place]) 11/23/2016  . Chronic pain of both shoulders 07/24/2016  . Prediabetes   . Anxiety   . Bipolar 1 disorder ([place])   . Depression   . Chronic back pain 11/07/2012  . Insomnia secondary to depression with anxiety 09/23/2012  . Unspecified vitamin D deficiency 09/23/2012  . COPD, mild ([place]) 07/01/2012  . Acute bronchitis 06/08/2012  . Adenocarcinoma of lung, stage 1 ([place]) 05/23/2012  . RECTAL BLEEDING 04/27/2010  .  HEMORRHOIDS 03/11/2009  . ADENOCARCINOMA, BREAST 12/15/2008  . FIBROIDS, UTERUS 12/15/2008  . HYPERCHOLESTEROLEMIA 12/15/2008  . Hyperlipidemia 12/15/2008  . Anxiety and depression 12/15/2008  . GASTROESOPHAGEAL REFLUX DISEASE 12/15/2008  . GASTRITIS 12/15/2008  . CONSTIPATION 12/15/2008  . FLATULENCE ERUCTATION AND GAS PAIN 12/15/2008  . ABDOMINAL PAIN 12/15/2008  . RECTAL BLEEDING, HX OF 12/15/2008   Deborah Mullins, Deborah Mullins  [phone] 08/11/2019, 12:05 PM  [place] [address] [place], [name], [address] Phone: [phone]   Fax:  [phone]  Name: Deborah Mullins MRN: [id number] Date of Birth: 1951-02-14

## 2019-08-12 ENCOUNTER — Encounter (HOSPITAL_COMMUNITY): Payer: Medicare Other

## 2019-08-13 ENCOUNTER — Ambulatory Visit (HOSPITAL_COMMUNITY): Payer: Medicare Other

## 2019-08-13 ENCOUNTER — Telehealth (HOSPITAL_COMMUNITY): Payer: Self-pay

## 2019-08-13 NOTE — Telephone Encounter (Signed)
pt called to cancel today's appt due to she is not feeling well.

## 2019-08-14 ENCOUNTER — Encounter (HOSPITAL_COMMUNITY): Payer: Medicare Other

## 2019-08-18 ENCOUNTER — Other Ambulatory Visit: Payer: Self-pay

## 2019-08-18 ENCOUNTER — Encounter (HOSPITAL_COMMUNITY): Payer: Self-pay

## 2019-08-18 ENCOUNTER — Ambulatory Visit (HOSPITAL_COMMUNITY): Payer: Medicare Other | Attending: Orthopedic Surgery

## 2019-08-18 DIAGNOSIS — R29898 Other symptoms and signs involving the musculoskeletal system: Secondary | ICD-10-CM | POA: Diagnosis not present

## 2019-08-18 DIAGNOSIS — M25611 Stiffness of right shoulder, not elsewhere classified: Secondary | ICD-10-CM | POA: Insufficient documentation

## 2019-08-18 DIAGNOSIS — M25511 Pain in right shoulder: Secondary | ICD-10-CM | POA: Insufficient documentation

## 2019-08-18 NOTE — Therapy (Signed)
[place], [name], [address] Phone: [phone]   Fax:  [phone]  Occupational Therapy Treatment  Patient Details  Name: Deborah Mullins MRN: [id number] Date of Birth: 01-Sep-1950 Referring Provider (OT): Dr. Alphonzo Severance    Encounter Date: 08/18/2019  OT End of Session - 08/18/19 1537    Visit Number  14    Number of Visits  16    Date for OT Re-Evaluation  08/23/19    Authorization Type  Medicare and [employer]    Authorization Time Period  10th visit progress note due on visit 17    Authorization - Visit Number  14    Authorization - Number of Visits  17    OT Start Time  1520    OT Stop Time  1552    OT Time Calculation (min)  32 min    Activity Tolerance  Patient tolerated treatment well    Behavior During Therapy  [facility] for tasks assessed/performed       Past Medical History:  Diagnosis Date  . Allergic rhinitis   . Anxiety   . Arthritis   . Bipolar 1 disorder ([place])   . Breast cancer ([place]) 2004  . Carpal tunnel syndrome of left wrist   . Chronic neck pain   . Chronic shoulder pain    L>R  . Depression   . GERD (gastroesophageal reflux disease)   . Headache(784.0)   . History of lung surgery   . Hyperlipidemia   . Hypertension   . Lung cancer ([place])   . Lung nodule 2009  . Osteoarthritis of right shoulder   . Pain in both feet   . Personal history of radiation therapy 2004  . PONV (postoperative nausea and vomiting)   . Type 2 diabetes mellitus ([place])     Past Surgical History:  Procedure Laterality Date  . BREAST SURGERY  2005   lumpectomy - left  . carpel tunnel Bilateral   . CESAREAN SECTION  1971  . COLONOSCOPY WITH PROPOFOL N/A 07/25/2016   Procedure: COLONOSCOPY WITH PROPOFOL;  Surgeon: Danie Binder, MD;  [facility];  Service: Endoscopy;  Laterality: N/A;  8:15 AM  . EYE SURGERY    . LUNG CANCER SURGERY Right 05/23/2012   reportedly clear  . TONSILLECTOMY    . TOTAL  SHOULDER ARTHROPLASTY Left 08/27/2018   Procedure: LEFT TOTAL SHOULDER ARTHROPLASTY;  Surgeon: Meredith Pel, MD;  Location: [facility];  Service: Orthopedics;  Laterality: Left;  . TOTAL SHOULDER ARTHROPLASTY Right 04/15/2019   Procedure: RIGHT TOTAL SHOULDER ARTHROPLASTY;  Surgeon: Meredith Pel, MD;  Location: [facility];  Service: Orthopedics;  Laterality: Right;  . TUBAL LIGATION      There were no vitals filed for this visit.  Subjective Assessment - 08/18/19 1532    Subjective   S: I pulled a muscle in my back and I don't know how it happened.    Currently in Pain?  No/denies         [facility] OT Assessment - 08/18/19 1534      Assessment   Medical Diagnosis  S/P Right Reverse Total Shoulder Replacement      Precautions   Precautions  Shoulder    Type of Shoulder Precautions  progress as tolerated                OT Treatments/Exercises (OP) - 08/18/19 1534      Exercises   Exercises  Shoulder  Shoulder Exercises: Supine   Protraction  PROM;5 reps;AROM;10 reps    Horizontal ABduction  PROM;5 reps;AROM;10 reps    External Rotation  PROM;5 reps;AROM;10 reps    Internal Rotation  PROM;5 reps;AROM;10 reps    Flexion  PROM;5 reps;AROM;10 reps    ABduction  PROM;5 reps;AROM;10 reps      Shoulder Exercises: Sidelying   External Rotation  Strengthening;10 reps    External Rotation Weight (lbs)  3    Internal Rotation  Strengthening;10 reps    Internal Rotation Weight (lbs)  3    Flexion  Strengthening;10 reps    Flexion Weight (lbs)  2    ABduction  Strengthening;10 reps    ABduction Weight (lbs)  2    Other Sidelying Exercises  horizontal abduction; 10X; 2#    Other Sidelying Exercises  protraction: 10X; 2#      Shoulder Exercises: ROM/Strengthening   UBE (Upper Arm Bike)  Level 2 3' reverse   pace: 10.0   X to V Arms  12X, 1#    Other ROM/Strengthening Exercises  Y lift off, 10X               OT Short Term Goals - 07/25/19 1109      OT  SHORT TERM GOAL #1   Title  patient will be educated and independent with a HEP for improved right shoulder mobility.    Time  4    Period  Weeks    Target Date  07/22/19      OT SHORT TERM GOAL #2   Title  Patient will improve right shoulder P/ROM to [facility] for improved ability to don and doff shirts.    Time  4    Period  Weeks      OT SHORT TERM GOAL #3   Title  Patient will improve right shoulder strength to 4/5 or stronger for improved ability to lift bags of groceries and laundry baskets.    Time  4    Period  Weeks      OT SHORT TERM GOAL #4   Title  Patient will decrease pain in her right shoulder to 5/10 or better during adl completion.    Time  4    Period  Weeks    Status  Partially Met      OT SHORT TERM GOAL #5   Title  Patient will decrease right shoulder fascial restrictions from mod to min for improved mobility needed for adl completion.    Time  4    Period  Weeks        OT Long Term Goals - 06/27/19 1511      OT LONG TERM GOAL #1   Title  Patient will return to prior level of independence with all b/iadls and leisure activities, using her right arm as dominant.    Time  8    Period  Weeks    Status  On-going      OT LONG TERM GOAL #2   Title  Patient will imrpove right shoulder a/rom to WNL for improved ability to reach into overhead cabinets, comb her hair, and reach behind her back to pull up pants.    Time  8    Period  Weeks    Status  On-going      OT LONG TERM GOAL #3   Title  Patient will improve right shoulder strength to 4+/5 or better for improved ability to lift pots and pans when cooking.  Time  8    Period  Weeks    Status  On-going      OT LONG TERM GOAL #4   Title  Patient will decrease pain in right shoulder to 3/10 or better when completing functional tasks.    Time  8    Period  Weeks    Status  On-going      OT LONG TERM GOAL #5   Title  Patient will decrease fascial restrictions to min-trace or better for improved mobility  needed for adl completion.    Time  8    Period  Weeks    Status  On-going            Plan - 08/18/19 1538    Clinical Impression Statement  A: Pt had limited tolerance during passive stretching due to pulled muscle in back and reports of increased pain the posterior portion of her shoulder. Pt was able to complete sidelying strengthening using 2# with VC for form and technique.    Body Structure / Function / Physical Skills  ADL;Muscle spasms;Scar mobility;UE functional use;Fascial restriction;Pain;Strength;ROM    Plan  P: Reassess for possible discharge. Complete FOTO. HEP update if needed.    Consulted and Agree with Plan of Care  Patient       Patient will benefit from skilled therapeutic intervention in order to improve the following deficits and impairments:   Body Structure / Function / Physical Skills: ADL, Muscle spasms, Scar mobility, UE functional use, Fascial restriction, Pain, Strength, ROM       Visit Diagnosis: Stiffness of right shoulder, not elsewhere classified  Other symptoms and signs involving the musculoskeletal system  Acute pain of right shoulder    Problem List Patient Active Problem List   Diagnosis Date Noted  . OA (osteoarthritis) of shoulder 04/15/2019  . Shoulder arthritis 08/27/2018  . Primary osteoarthritis, left shoulder 04/01/2018  . Primary osteoarthritis, right shoulder 04/01/2018  . Essential hypertension 01/22/2017  . Controlled type 2 diabetes mellitus without complication, without long-term current use of insulin ([place]) 11/23/2016  . Chronic pain of both shoulders 07/24/2016  . Prediabetes   . Anxiety   . Bipolar 1 disorder ([place])   . Depression   . Chronic back pain 11/07/2012  . Insomnia secondary to depression with anxiety 09/23/2012  . Unspecified vitamin D deficiency 09/23/2012  . COPD, mild ([place]) 07/01/2012  . Acute bronchitis 06/08/2012  . Adenocarcinoma of lung, stage 1 ([place]) 05/23/2012  . RECTAL BLEEDING 04/27/2010   . HEMORRHOIDS 03/11/2009  . ADENOCARCINOMA, BREAST 12/15/2008  . FIBROIDS, UTERUS 12/15/2008  . HYPERCHOLESTEROLEMIA 12/15/2008  . Hyperlipidemia 12/15/2008  . Anxiety and depression 12/15/2008  . GASTROESOPHAGEAL REFLUX DISEASE 12/15/2008  . GASTRITIS 12/15/2008  . CONSTIPATION 12/15/2008  . FLATULENCE ERUCTATION AND GAS PAIN 12/15/2008  . ABDOMINAL PAIN 12/15/2008  . RECTAL BLEEDING, HX OF 12/15/2008   Ailene Ravel, OTR/L,CBIS  [phone]   08/18/2019, 4:00 PM  [place] [address] [place], [name], [address] Phone: [phone]   Fax:  [phone]  Name: ODETH BRY MRN: [id number] Date of Birth: 11/08/1950

## 2019-08-20 ENCOUNTER — Ambulatory Visit (HOSPITAL_COMMUNITY): Payer: Medicare Other | Admitting: Occupational Therapy

## 2019-08-20 ENCOUNTER — Telehealth (HOSPITAL_COMMUNITY): Payer: Self-pay | Admitting: Occupational Therapy

## 2019-08-20 NOTE — Telephone Encounter (Signed)
Pt has pulled a muscle in her back and r/s today's apptment for next week.

## 2019-08-27 ENCOUNTER — Encounter (HOSPITAL_COMMUNITY): Payer: Self-pay | Admitting: Occupational Therapy

## 2019-08-27 ENCOUNTER — Other Ambulatory Visit: Payer: Self-pay

## 2019-08-27 ENCOUNTER — Ambulatory Visit (HOSPITAL_COMMUNITY): Payer: Medicare Other | Admitting: Occupational Therapy

## 2019-08-27 DIAGNOSIS — R29898 Other symptoms and signs involving the musculoskeletal system: Secondary | ICD-10-CM

## 2019-08-27 DIAGNOSIS — M25611 Stiffness of right shoulder, not elsewhere classified: Secondary | ICD-10-CM

## 2019-08-27 DIAGNOSIS — M25511 Pain in right shoulder: Secondary | ICD-10-CM | POA: Diagnosis not present

## 2019-08-27 NOTE — Therapy (Signed)
[place], [name], [address] Phone: [phone]   Fax:  [phone]  Occupational Therapy Reassessment, Treatment, Discharge Summary   Patient Details  Name: Deborah Mullins MRN: [id number] Date of Birth: 08/23/1950 Referring Provider (OT): Dr. Alphonzo Severance   Progress Note Reporting Period 07/25/2019 to 08/27/2019  See note below for Objective Data and Assessment of Progress/Goals.       Encounter Date: 08/27/2019  OT End of Session - 08/27/19 0926    Visit Number  15    Number of Visits  16    Date for OT Re-Evaluation  08/23/19    Authorization Type  Medicare and [employer]    Authorization Time Period  10th visit progress note due on visit 17    Authorization - Visit Number  15    Authorization - Number of Visits  17    OT Start Time  [phone]    OT Stop Time  0915    OT Time Calculation (min)  23 min    Activity Tolerance  Patient tolerated treatment well    Behavior During Therapy  WFL for tasks assessed/performed       Past Medical History:  Diagnosis Date  . Allergic rhinitis   . Anxiety   . Arthritis   . Bipolar 1 disorder ([place])   . Breast cancer ([place]) 2004  . Carpal tunnel syndrome of left wrist   . Chronic neck pain   . Chronic shoulder pain    L>R  . Depression   . GERD (gastroesophageal reflux disease)   . Headache(784.0)   . History of lung surgery   . Hyperlipidemia   . Hypertension   . Lung cancer ([place])   . Lung nodule 2009  . Osteoarthritis of right shoulder   . Pain in both feet   . Personal history of radiation therapy 2004  . PONV (postoperative nausea and vomiting)   . Type 2 diabetes mellitus ([place])     Past Surgical History:  Procedure Laterality Date  . BREAST SURGERY  2005   lumpectomy - left  . carpel tunnel Bilateral   . CESAREAN SECTION  1971  . COLONOSCOPY WITH PROPOFOL N/A 07/25/2016   Procedure: COLONOSCOPY WITH PROPOFOL;  Surgeon: Danie Binder, MD;  [facility];  Service: Endoscopy;  Laterality: N/A;  8:15 AM  . EYE SURGERY    . LUNG CANCER SURGERY Right 05/23/2012   reportedly clear  . TONSILLECTOMY    . TOTAL SHOULDER ARTHROPLASTY Left 08/27/2018   Procedure: LEFT TOTAL SHOULDER ARTHROPLASTY;  Surgeon: Meredith Pel, MD;  Location: [facility];  Service: Orthopedics;  Laterality: Left;  . TOTAL SHOULDER ARTHROPLASTY Right 04/15/2019   Procedure: RIGHT TOTAL SHOULDER ARTHROPLASTY;  Surgeon: Meredith Pel, MD;  Location: [facility];  Service: Orthopedics;  Laterality: Right;  . TUBAL LIGATION      There were no vitals filed for this visit.  Subjective Assessment - 08/27/19 0850    Subjective   S: I'm doing good except for my back.    Currently in Pain?  Yes    Pain Score  8     Pain Location  Back    Pain Orientation  Right    Pain Descriptors / Indicators  Shooting    Pain Type  Acute pain    Pain Radiating Towards  up the back    Pain Onset  1 to 4 weeks ago    Pain Frequency  Constant    Aggravating Factors   movement    Pain Relieving Factors  rest    Effect of Pain on Daily Activities  mod effect on ADLs    Multiple Pain Sites  No         [facility] OT Assessment - 08/27/19 0850      Assessment   Medical Diagnosis  S/P Right Reverse Total Shoulder Replacement      Precautions   Precautions  Shoulder    Type of Shoulder Precautions  progress as tolerated       Observation/Other Assessments   Focus on Therapeutic Outcomes (FOTO)   59/100   63/100 previous     Palpation   Palpation comment  min restrictions in right shoulder region       AROM   Overall AROM Comments  assess in seated position, external and internal rotation with shoulder adducted    AROM Assessment Site  Shoulder    Right/Left Shoulder  Right    Right Shoulder Flexion  142 Degrees   135 previous   Right Shoulder ABduction  140 Degrees   100 previous   Right Shoulder Internal Rotation  90 Degrees   same as previous   Right Shoulder External  Rotation  60 Degrees   58 previous     PROM   Overall PROM Comments  assessed in supine, external and internal rotation with shoulder adducted    PROM Assessment Site  Shoulder    Right/Left Shoulder  Right    Right Shoulder Flexion  150 Degrees   141 previous   Right Shoulder ABduction  170 Degrees   155 previous   Right Shoulder Internal Rotation  90 Degrees   same as previous   Right Shoulder External Rotation  54 Degrees   52 previous     Strength   Overall Strength Comments  assessed in seated, external and internal rotation with shoulder adducted    Strength Assessment Site  Shoulder    Right/Left Shoulder  Right    Right Shoulder Flexion  4+/5   4/5 previous   Right Shoulder ABduction  4+/5   4/5 previous   Right Shoulder Internal Rotation  5/5   4-/5 previous   Right Shoulder External Rotation  5/5   same as previous              OT Treatments/Exercises (OP) - 08/27/19 0925      Exercises   Exercises  Shoulder      Shoulder Exercises: Supine   Protraction  PROM;5 reps    Horizontal ABduction  PROM;5 reps    External Rotation  PROM;5 reps    Internal Rotation  PROM;5 reps    Flexion  PROM;5 reps    ABduction  PROM;5 reps             OT Education - 08/27/19 0910    Education Details  red theraband strengthening    Person(s) Educated  Patient    Methods  Explanation;Demonstration;Handout    Comprehension  Verbalized understanding;Returned demonstration       OT Short Term Goals - 08/27/19 0908      OT SHORT TERM GOAL #1   Title  patient will be educated and independent with a HEP for improved right shoulder mobility.    Time  4    Period  Weeks    Target Date  07/22/19      OT SHORT TERM GOAL #2   Title  Patient will improve  right shoulder P/ROM to [facility] for improved ability to don and doff shirts.    Time  4    Period  Weeks      OT SHORT TERM GOAL #3   Title  Patient will improve right shoulder strength to 4/5 or stronger for  improved ability to lift bags of groceries and laundry baskets.    Time  4    Period  Weeks      OT SHORT TERM GOAL #4   Title  Patient will decrease pain in her right shoulder to 5/10 or better during adl completion.    Time  4    Period  Weeks    Status  Achieved      OT SHORT TERM GOAL #5   Title  Patient will decrease right shoulder fascial restrictions from mod to min for improved mobility needed for adl completion.    Time  4    Period  Weeks        OT Long Term Goals - 08/27/19 0908      OT LONG TERM GOAL #1   Title  Patient will return to prior level of independence with all b/iadls and leisure activities, using her right arm as dominant.    Time  8    Period  Weeks    Status  Achieved      OT LONG TERM GOAL #2   Title  Patient will imrpove right shoulder a/rom to WNL for improved ability to reach into overhead cabinets, comb her hair, and reach behind her back to pull up pants.    Time  8    Period  Weeks    Status  Partially Met      OT LONG TERM GOAL #3   Title  Patient will improve right shoulder strength to 4+/5 or better for improved ability to lift pots and pans when cooking.    Time  8    Period  Weeks    Status  Achieved      OT LONG TERM GOAL #4   Title  Patient will decrease pain in right shoulder to 3/10 or better when completing functional tasks.    Time  8    Period  Weeks    Status  Achieved      OT LONG TERM GOAL #5   Title  Patient will decrease fascial restrictions to min-trace or better for improved mobility needed for adl completion.    Time  8    Period  Weeks    Status  Achieved            Plan - 08/27/19 0926    Clinical Impression Statement  A: Reassessment completed this session, pt reports she is now using her RUE for all daily tasks including reaching up for items in cabinets, dressing, bathing, washing and drying hair. Pt has progressed with ROM and strength of RUE, meeting all STGs and 4/5 LTGs with remaining LTG partially  met. Pt educated on HEP for continued RUE strengthening and is agreeable to discharge today. Pt unable to participate in active exercises of RUE today due to severe back pain.    Body Structure / Function / Physical Skills  ADL;Muscle spasms;Scar mobility;UE functional use;Fascial restriction;Pain;Strength;ROM    Plan  P: Discharge pt       Patient will benefit from skilled therapeutic intervention in order to improve the following deficits and impairments:   Body Structure / Function / Physical Skills: ADL, Muscle spasms, Scar  mobility, UE functional use, Fascial restriction, Pain, Strength, ROM       Visit Diagnosis: Stiffness of right shoulder, not elsewhere classified  Other symptoms and signs involving the musculoskeletal system  Acute pain of right shoulder    Problem List Patient Active Problem List   Diagnosis Date Noted  . OA (osteoarthritis) of shoulder 04/15/2019  . Shoulder arthritis 08/27/2018  . Primary osteoarthritis, left shoulder 04/01/2018  . Primary osteoarthritis, right shoulder 04/01/2018  . Essential hypertension 01/22/2017  . Controlled type 2 diabetes mellitus without complication, without long-term current use of insulin ([place]) 11/23/2016  . Chronic pain of both shoulders 07/24/2016  . Prediabetes   . Anxiety   . Bipolar 1 disorder ([place])   . Depression   . Chronic back pain 11/07/2012  . Insomnia secondary to depression with anxiety 09/23/2012  . Unspecified vitamin D deficiency 09/23/2012  . COPD, mild ([place]) 07/01/2012  . Acute bronchitis 06/08/2012  . Adenocarcinoma of lung, stage 1 ([place]) 05/23/2012  . RECTAL BLEEDING 04/27/2010  . HEMORRHOIDS 03/11/2009  . ADENOCARCINOMA, BREAST 12/15/2008  . FIBROIDS, UTERUS 12/15/2008  . HYPERCHOLESTEROLEMIA 12/15/2008  . Hyperlipidemia 12/15/2008  . Anxiety and depression 12/15/2008  . GASTROESOPHAGEAL REFLUX DISEASE 12/15/2008  . GASTRITIS 12/15/2008  . CONSTIPATION 12/15/2008  . FLATULENCE ERUCTATION  AND GAS PAIN 12/15/2008  . ABDOMINAL PAIN 12/15/2008  . RECTAL BLEEDING, HX OF 12/15/2008   Guadelupe Sabin, OTR/L  [phone] 08/27/2019, 9:30 AM  [facility] [address] [place], [name], [address] Phone: [phone]   Fax:  [phone]  Name: Deborah Mullins MRN: [id number] Date of Birth: 06-Apr-1951   OCCUPATIONAL THERAPY DISCHARGE SUMMARY  Visits from Start of Care: 15  Current functional level related to goals / functional outcomes: See above. Pt using RUE as dominant for all ADLs, feels much better than prior to surgery and is agreeable to discharge today.    Remaining deficits: Decreased ROM and activity tolerance in RUE   Education / Equipment: HEP for continued RUE strengthening Plan: Patient agrees to discharge.  Patient goals were met. Patient is being discharged due to meeting the stated rehab goals.  ?????

## 2019-08-27 NOTE — Patient Instructions (Signed)
Strengthening: Chest Pull - Resisted   Hold Theraband in front of body with hands about shoulder width a part. Pull band a part and back together slowly. Repeat ___10_ times. Complete __1__ set(s) per session.. Repeat __1__ session(s) per day.  http://orth.exer.us/926   Copyright  VHI. All rights reserved.   PNF Strengthening: Resisted   Standing with resistive band around each hand, bring right arm up and away, thumb back. Repeat __10__ times per set. Do _1___ sets per session. Do _1___ sessions per day.                           Resisted External Rotation: in Neutral - Bilateral   Sit or stand, tubing in both hands, elbows at sides, bent to 90, forearms forward. Pinch shoulder blades together and rotate forearms out. Keep elbows at sides. Repeat _10___ times per set. Do _1___ sets per session. Do __1__ sessions per day.  http://orth.exer.us/966   Copyright  VHI. All rights reserved.   PNF Strengthening: Resisted   Standing, hold resistive band above head. Bring right arm down and out from side. Repeat _10___ times per set. Do __1__ sets per session. Do __1__ sessions per day.  http://orth.exer.us/922   Copyright  VHI. All rights reserved.

## 2019-08-28 ENCOUNTER — Other Ambulatory Visit (HOSPITAL_COMMUNITY): Payer: Self-pay | Admitting: Internal Medicine

## 2019-08-28 DIAGNOSIS — Z1231 Encounter for screening mammogram for malignant neoplasm of breast: Secondary | ICD-10-CM

## 2019-09-08 ENCOUNTER — Other Ambulatory Visit: Payer: Self-pay

## 2019-09-08 ENCOUNTER — Encounter: Payer: Self-pay | Admitting: Orthopedic Surgery

## 2019-09-08 ENCOUNTER — Ambulatory Visit (INDEPENDENT_AMBULATORY_CARE_PROVIDER_SITE_OTHER): Payer: Medicare Other | Admitting: Orthopedic Surgery

## 2019-09-08 DIAGNOSIS — Z96611 Presence of right artificial shoulder joint: Secondary | ICD-10-CM | POA: Diagnosis not present

## 2019-09-09 MED ORDER — HYDROCODONE-ACETAMINOPHEN 5-325 MG PO TABS: 1.0000 | ORAL_TABLET | Freq: Every day | ORAL | 0 refills | Status: DC | PRN

## 2019-09-11 ENCOUNTER — Encounter: Payer: Self-pay | Admitting: Orthopedic Surgery

## 2019-09-11 NOTE — Progress Notes (Signed)
Office Visit Note   Patient: Deborah Mullins           Date of Birth: [birth year]           MRN: [id number] Visit Date: 09/08/2019 Requested by: Susy Frizzle, MD [address] [place],  [place] [address] PCP: Susy Frizzle, MD  Subjective: Chief Complaint  Patient presents with  . Right Shoulder - Pain    HPI: Deborah Mullins is a 69 y.o. female who presents to the office complaining of right shoulder pain.  Patient is s/p right reverse shoulder arthroplasty on 04/26/2019.  Patient just finished physical therapy last week.  She notes that she is doing better in regards to pain and range of motion.  She is happy with the progress she has made with her shoulder.  She notes pain when raising her arm up and occasional waking up at night with uncomfortable right shoulder pain but overall continues to progress.  She does note some right-sided rib pain.  She has seen her primary care doctor for this and had a steroid Dosepak that helped with her symptoms.  This rib pain is improving.  Not associated with any shortness of breath.  This is her main concern at this time rather than the right shoulder..                ROS:  All systems reviewed are negative as they relate to the chief complaint within the history of present illness.  Patient denies fevers or chills.  Assessment & Plan: Visit Diagnoses: No diagnosis found.  Plan: Patient is a 69 year old female who presents following right reverse shoulder arthroplasty on 04/26/2019.  She continues to improve and has finished physical therapy.  She is up to 90 degrees of abduction, 60 degrees of external rotation, greater than 90 degrees of forward flexion.  She continues to progress well   We will refill her Norco 1 more time.  Patient will follow up as needed.  In regards to the rib pain she is following up with a pulmonologist for a recheck.  No significant concern for emergency at this time by my impression as she is tender to palpation  over the ribs without any shortness of breath.  Follow-Up Instructions: No follow-ups on file.   Orders:  No orders of the defined types were placed in this encounter.  Meds ordered this encounter  Medications  . HYDROcodone-acetaminophen (NORCO/VICODIN) 5-325 MG tablet    Sig: Take 1 tablet by mouth daily as needed for moderate pain.    Dispense:  25 tablet    Refill:  0      Procedures: No procedures performed   Clinical Data: No additional findings.  Objective: Vital Signs: There were no vitals taken for this visit.  Physical Exam:  Constitutional: Patient appears well-developed HEENT:  Head: Normocephalic Eyes:EOM are normal Neck: Normal range of motion Cardiovascular: Normal rate Pulmonary/chest: Effort normal Neurologic: Patient is alert Skin: Skin is warm Psychiatric: Patient has normal mood and affect  Ortho Exam:  Right shoulder Exam Forward flexion greater than 90 degrees Abduction to 90 degrees External rotation to 60 degrees Good strength of the subscapularis Incision is well-healed Tender to palpation over the right distal ribs  Specialty Comments:  No specialty comments available.  Imaging: No results found.   PMFS History: Patient Active Problem List   Diagnosis Date Noted  . OA (osteoarthritis) of shoulder 04/15/2019  . Shoulder arthritis 08/27/2018  . Primary  osteoarthritis, left shoulder 04/01/2018  . Primary osteoarthritis, right shoulder 04/01/2018  . Essential hypertension 01/22/2017  . Controlled type 2 diabetes mellitus without complication, without long-term current use of insulin ([place]) 11/23/2016  . Chronic pain of both shoulders 07/24/2016  . Prediabetes   . Anxiety   . Bipolar 1 disorder ([place])   . Depression   . Chronic back pain 11/07/2012  . Insomnia secondary to depression with anxiety 09/23/2012  . Unspecified vitamin D deficiency 09/23/2012  . COPD, mild ([place]) 07/01/2012  . Acute bronchitis 06/08/2012  .  Adenocarcinoma of lung, stage 1 ([place]) 05/23/2012  . RECTAL BLEEDING 04/27/2010  . HEMORRHOIDS 03/11/2009  . ADENOCARCINOMA, BREAST 12/15/2008  . FIBROIDS, UTERUS 12/15/2008  . HYPERCHOLESTEROLEMIA 12/15/2008  . Hyperlipidemia 12/15/2008  . Anxiety and depression 12/15/2008  . GASTROESOPHAGEAL REFLUX DISEASE 12/15/2008  . GASTRITIS 12/15/2008  . CONSTIPATION 12/15/2008  . FLATULENCE ERUCTATION AND GAS PAIN 12/15/2008  . ABDOMINAL PAIN 12/15/2008  . RECTAL BLEEDING, HX OF 12/15/2008   Past Medical History:  Diagnosis Date  . Allergic rhinitis   . Anxiety   . Arthritis   . Bipolar 1 disorder ([place])   . Breast cancer ([place]) 2004  . Carpal tunnel syndrome of left wrist   . Chronic neck pain   . Chronic shoulder pain    L>R  . Depression   . GERD (gastroesophageal reflux disease)   . Headache(784.0)   . History of lung surgery   . Hyperlipidemia   . Hypertension   . Lung cancer ([place])   . Lung nodule 2009  . Osteoarthritis of right shoulder   . Pain in both feet   . Personal history of radiation therapy 2004  . PONV (postoperative nausea and vomiting)   . Type 2 diabetes mellitus ([facility])     Family History  Problem Relation Age of Onset  . Heart disease Mother        CHF  . Physical abuse Mother   . Depression Mother   . Cancer Sister   . ADD / ADHD Sister   . Alcohol abuse Father   . Alcohol abuse Brother   . Mental illness Brother   . Cancer Sister   . Heart disease Sister   . Physical abuse Sister   . OCD Sister   . Mental illness Sister   . Anxiety disorder Neg Hx   . Bipolar disorder Neg Hx   . Dementia Neg Hx   . Drug abuse Neg Hx   . Paranoid behavior Neg Hx   . Schizophrenia Neg Hx   . Seizures Neg Hx   . Sexual abuse Neg Hx     Past Surgical History:  Procedure Laterality Date  . BREAST SURGERY  2005   lumpectomy - left  . carpel tunnel Bilateral   . CESAREAN SECTION  1971  . COLONOSCOPY WITH PROPOFOL N/A 07/25/2016   Procedure: COLONOSCOPY WITH  PROPOFOL;  Surgeon: Danie Binder, MD;  [facility];  Service: Endoscopy;  Laterality: N/A;  8:15 AM  . EYE SURGERY    . LUNG CANCER SURGERY Right 05/23/2012   reportedly clear  . TONSILLECTOMY    . TOTAL SHOULDER ARTHROPLASTY Left 08/27/2018   Procedure: LEFT TOTAL SHOULDER ARTHROPLASTY;  Surgeon: Meredith Pel, MD;  Location: [facility];  Service: Orthopedics;  Laterality: Left;  . TOTAL SHOULDER ARTHROPLASTY Right 04/15/2019   Procedure: RIGHT TOTAL SHOULDER ARTHROPLASTY;  Surgeon: Meredith Pel, MD;  Location: [facility];  Service: Orthopedics;  Laterality: Right;  . TUBAL LIGATION     Social History   Occupational History  . Occupation: retired    Comment: Location manager  Tobacco Use  . Smoking status: Current Every Day Smoker    Packs/day: 0.50    Years: 0.00    Pack years: 0.00    Types: Cigarettes  . Smokeless tobacco: Never Used  Substance and Sexual Activity  . Alcohol use: Yes    Comment: occasionally   . Drug use: Yes    Types: Marijuana    Comment: smoking daily  . Sexual activity: Never

## 2019-09-15 ENCOUNTER — Ambulatory Visit (HOSPITAL_COMMUNITY): Payer: Medicare Other

## 2019-10-11 ENCOUNTER — Ambulatory Visit (HOSPITAL_COMMUNITY)
Admission: RE | Admit: 2019-10-11 | Discharge: 2019-10-11 | Disposition: A | Discharge status: Home / Self Care | Payer: Medicare Other | Source: Ambulatory Visit | Attending: Nurse Practitioner | Admitting: Nurse Practitioner

## 2019-10-11 ENCOUNTER — Other Ambulatory Visit (HOSPITAL_COMMUNITY): Payer: Self-pay | Admitting: Nurse Practitioner

## 2019-10-11 ENCOUNTER — Other Ambulatory Visit: Payer: Self-pay

## 2019-10-11 DIAGNOSIS — M549 Dorsalgia, unspecified: Secondary | ICD-10-CM | POA: Diagnosis not present

## 2019-10-11 DIAGNOSIS — M5134 Other intervertebral disc degeneration, thoracic region: Secondary | ICD-10-CM | POA: Diagnosis not present

## 2019-10-11 DIAGNOSIS — M25511 Pain in right shoulder: Secondary | ICD-10-CM

## 2019-10-11 DIAGNOSIS — R062 Wheezing: Secondary | ICD-10-CM | POA: Diagnosis not present

## 2019-10-11 DIAGNOSIS — F17210 Nicotine dependence, cigarettes, uncomplicated: Secondary | ICD-10-CM | POA: Diagnosis not present

## 2019-10-11 DIAGNOSIS — R079 Chest pain, unspecified: Secondary | ICD-10-CM | POA: Diagnosis not present

## 2019-10-11 DIAGNOSIS — M62838 Other muscle spasm: Secondary | ICD-10-CM | POA: Diagnosis not present

## 2019-10-16 DIAGNOSIS — T63441A Toxic effect of venom of bees, accidental (unintentional), initial encounter: Secondary | ICD-10-CM | POA: Diagnosis not present

## 2019-10-16 DIAGNOSIS — G9009 Other idiopathic peripheral autonomic neuropathy: Secondary | ICD-10-CM | POA: Diagnosis not present

## 2019-10-16 DIAGNOSIS — F330 Major depressive disorder, recurrent, mild: Secondary | ICD-10-CM | POA: Diagnosis not present

## 2019-10-16 DIAGNOSIS — J449 Chronic obstructive pulmonary disease, unspecified: Secondary | ICD-10-CM | POA: Diagnosis not present

## 2019-10-16 DIAGNOSIS — K219 Gastro-esophageal reflux disease without esophagitis: Secondary | ICD-10-CM | POA: Diagnosis not present

## 2019-10-16 DIAGNOSIS — J302 Other seasonal allergic rhinitis: Secondary | ICD-10-CM | POA: Diagnosis not present

## 2019-10-16 DIAGNOSIS — M549 Dorsalgia, unspecified: Secondary | ICD-10-CM | POA: Diagnosis not present

## 2019-10-16 DIAGNOSIS — E785 Hyperlipidemia, unspecified: Secondary | ICD-10-CM | POA: Diagnosis not present

## 2019-10-16 DIAGNOSIS — E1140 Type 2 diabetes mellitus with diabetic neuropathy, unspecified: Secondary | ICD-10-CM | POA: Diagnosis not present

## 2019-10-16 DIAGNOSIS — I10 Essential (primary) hypertension: Secondary | ICD-10-CM | POA: Diagnosis not present

## 2019-10-16 DIAGNOSIS — M25511 Pain in right shoulder: Secondary | ICD-10-CM | POA: Diagnosis not present

## 2019-10-16 DIAGNOSIS — R600 Localized edema: Secondary | ICD-10-CM | POA: Diagnosis not present

## 2019-10-16 DIAGNOSIS — F17210 Nicotine dependence, cigarettes, uncomplicated: Secondary | ICD-10-CM | POA: Diagnosis not present

## 2019-10-20 DIAGNOSIS — E1140 Type 2 diabetes mellitus with diabetic neuropathy, unspecified: Secondary | ICD-10-CM | POA: Diagnosis not present

## 2019-10-20 DIAGNOSIS — I10 Essential (primary) hypertension: Secondary | ICD-10-CM | POA: Diagnosis not present

## 2019-10-20 DIAGNOSIS — E785 Hyperlipidemia, unspecified: Secondary | ICD-10-CM | POA: Diagnosis not present

## 2019-10-20 DIAGNOSIS — Z0001 Encounter for general adult medical examination with abnormal findings: Secondary | ICD-10-CM | POA: Diagnosis not present

## 2019-10-20 DIAGNOSIS — G9009 Other idiopathic peripheral autonomic neuropathy: Secondary | ICD-10-CM | POA: Diagnosis not present

## 2019-10-20 DIAGNOSIS — K219 Gastro-esophageal reflux disease without esophagitis: Secondary | ICD-10-CM | POA: Diagnosis not present

## 2019-10-20 DIAGNOSIS — M25511 Pain in right shoulder: Secondary | ICD-10-CM | POA: Diagnosis not present

## 2019-10-20 DIAGNOSIS — R05 Cough: Secondary | ICD-10-CM | POA: Diagnosis not present

## 2019-10-20 DIAGNOSIS — K5900 Constipation, unspecified: Secondary | ICD-10-CM | POA: Diagnosis not present

## 2019-10-20 DIAGNOSIS — J449 Chronic obstructive pulmonary disease, unspecified: Secondary | ICD-10-CM | POA: Diagnosis not present

## 2019-10-20 DIAGNOSIS — F330 Major depressive disorder, recurrent, mild: Secondary | ICD-10-CM | POA: Diagnosis not present

## 2019-10-20 DIAGNOSIS — J302 Other seasonal allergic rhinitis: Secondary | ICD-10-CM | POA: Diagnosis not present

## 2019-10-29 ENCOUNTER — Ambulatory Visit (INDEPENDENT_AMBULATORY_CARE_PROVIDER_SITE_OTHER): Payer: Medicare Other

## 2019-10-29 ENCOUNTER — Ambulatory Visit (INDEPENDENT_AMBULATORY_CARE_PROVIDER_SITE_OTHER): Payer: Medicare Other | Admitting: Orthopedic Surgery

## 2019-10-29 ENCOUNTER — Ambulatory Visit: Payer: Self-pay

## 2019-10-29 ENCOUNTER — Other Ambulatory Visit: Payer: Self-pay

## 2019-10-29 DIAGNOSIS — M545 Low back pain, unspecified: Secondary | ICD-10-CM

## 2019-10-29 DIAGNOSIS — M25511 Pain in right shoulder: Secondary | ICD-10-CM

## 2019-10-29 DIAGNOSIS — Z96611 Presence of right artificial shoulder joint: Secondary | ICD-10-CM | POA: Diagnosis not present

## 2019-10-29 DIAGNOSIS — Z96619 Presence of unspecified artificial shoulder joint: Secondary | ICD-10-CM

## 2019-10-29 DIAGNOSIS — M25512 Pain in left shoulder: Secondary | ICD-10-CM | POA: Diagnosis not present

## 2019-10-29 MED ORDER — HYDROCODONE-ACETAMINOPHEN 5-325 MG PO TABS: 1.0000 | ORAL_TABLET | Freq: Every evening | ORAL | 0 refills | Status: DC | PRN

## 2019-10-31 ENCOUNTER — Encounter: Payer: Self-pay | Admitting: Orthopedic Surgery

## 2019-10-31 NOTE — Progress Notes (Signed)
Office Visit Note   Patient: Deborah Mullins           Date of Birth: [birth year]           MRN: [id number] Visit Date: 10/29/2019 Requested by: Noreene Larsson, NP [address] [place],  [place] [address] PCP: Noreene Larsson, NP  Subjective: Chief Complaint  Patient presents with  . Right Shoulder - Pain  . Left Shoulder - Pain    HPI: Deborah Mullins is a 69 y.o. female who presents to the office complaining of back pain.  Patient notes that she slipped on Frost on her porch, falling flat on her back several weeks ago.  She notes that she was unable to get around without severe pain in her T-spine and L-spine initially.  She stayed in bed for about 1 week before being able to move around for any significant amount of time.  She denied any radicular symptoms.  Her pain is slowly trending better.  She did have a prescription for pain medicine a week ago.  She localizes the majority of her pain to the axial low thoracic and lumbar spine.  She denies any leg pain, numbness/tingling, leg weakness.  Coughing and sneezing causes pain in her low back but no symptoms in her legs.  Patient went to an urgent care after injury who prescribed a prednisone taper that provided no relief.  She had x-rays that her PCP read as negative.  Her PCP tried another prednisone course but provided no relief.  She has been taking Tylenol as well as Robaxin with some relief.  She denies taking any narcotic medications.  She cannot take anti-inflammatories.  She has a history of bilateral shoulder replacements and denies any shoulder pain or dysfunction.                ROS:  All systems reviewed are negative as they relate to the chief complaint within the history of present illness.  Patient denies fevers or chills.  Assessment & Plan: Visit Diagnoses:  1. Low back pain, unspecified back pain laterality, unspecified chronicity, unspecified whether sciatica present   2. S/P reverse total shoulder arthroplasty,  right   3. Status post total shoulder replacement, unspecified laterality     Plan: Patient is a 69 year old female who presents complaining of low back pain.  Onset of pain after a fall onto her back in late February.  Her pain is steadily improving but she is making slow progress.  She has no nerve root tension signs on exam.  Radiographs are negative for any acute fracture.  Radiographs of the bilateral shoulder show the bilateral prostheses are in good position with no complicating features.  Prescribed a one-time prescription of hydrocodone No. 15.  Discussed with patient that this is the only prescription for pain medication we can provide for her.  Plan for patient to follow-up as needed.  She has continued pain in the neck step would likely be MRI scan.  Patient agreed with this plan and will follow up with the office as needed.  Follow-Up Instructions: No follow-ups on file.   Orders:  Orders Placed This Encounter  Procedures  . XR Lumbar Spine 2-3 Views  . XR Shoulder Right  . XR Shoulder Left   Meds ordered this encounter  Medications  . HYDROcodone-acetaminophen (NORCO/VICODIN) 5-325 MG tablet    Sig: Take 1 tablet by mouth at bedtime as needed for moderate pain.    Dispense:  15 tablet    Refill:  0      Procedures: No procedures performed   Clinical Data: No additional findings.  Objective: Vital Signs: There were no vitals taken for this visit.  Physical Exam:  Constitutional: Patient appears well-developed HEENT:  Head: Normocephalic Eyes:EOM are normal Neck: Normal range of motion Cardiovascular: Normal rate Pulmonary/chest: Effort normal Neurologic: Patient is alert Skin: Skin is warm Psychiatric: Patient has normal mood and affect  Ortho Exam:  Bilateral shoulder Exam Well-healed incision from shoulder replacements Able to forward flex and abduct shoulders overhead without pain No tenderness palpation throughout the bilateral humeral shafts  Back  exam Moderate tenderness palpation throughout the axial lumbar spine, most severe around L2. No tenderness palpation throughout the paraspinal musculature. 5/5 motor strength of the bilateral hip flexors, quadriceps, hamstring, dorsiflexion, plantarflexion.  Sensation intact through all the dermatomes of the lower extremities except for the toes bilaterally.  No evidence of hypo or hyperreflexia   Specialty Comments:  No specialty comments available.  Imaging: No results found.   PMFS History: Patient Active Problem List   Diagnosis Date Noted  . OA (osteoarthritis) of shoulder 04/15/2019  . Shoulder arthritis 08/27/2018  . Primary osteoarthritis, left shoulder 04/01/2018  . Primary osteoarthritis, right shoulder 04/01/2018  . Essential hypertension 01/22/2017  . Controlled type 2 diabetes mellitus without complication, without long-term current use of insulin ([facility]) 11/23/2016  . Chronic pain of both shoulders 07/24/2016  . Prediabetes   . Anxiety   . Bipolar 1 disorder ([place] [facility])   . Depression   . Chronic back pain 11/07/2012  . Insomnia secondary to depression with anxiety 09/23/2012  . Unspecified vitamin D deficiency 09/23/2012  . COPD, mild ([facility]) 07/01/2012  . Acute bronchitis 06/08/2012  . Adenocarcinoma of lung, stage 1 ([facility]) 05/23/2012  . RECTAL BLEEDING 04/27/2010  . HEMORRHOIDS 03/11/2009  . ADENOCARCINOMA, BREAST 12/15/2008  . FIBROIDS, UTERUS 12/15/2008  . HYPERCHOLESTEROLEMIA 12/15/2008  . Hyperlipidemia 12/15/2008  . Anxiety and depression 12/15/2008  . GASTROESOPHAGEAL REFLUX DISEASE 12/15/2008  . GASTRITIS 12/15/2008  . CONSTIPATION 12/15/2008  . FLATULENCE ERUCTATION AND GAS PAIN 12/15/2008  . ABDOMINAL PAIN 12/15/2008  . RECTAL BLEEDING, HX OF 12/15/2008   Past Medical History:  Diagnosis Date  . Allergic rhinitis   . Anxiety   . Arthritis   . Bipolar 1 disorder ([facility])   . Breast cancer ([facility]) 2004  . Carpal tunnel syndrome of left wrist   . Chronic  neck pain   . Chronic shoulder pain    L>R  . Depression   . GERD (gastroesophageal reflux disease)   . Headache(784.0)   . History of lung surgery   . Hyperlipidemia   . Hypertension   . Lung cancer ([facility])   . Lung nodule 2009  . Osteoarthritis of right shoulder   . Pain in both feet   . Personal history of radiation therapy 2004  . PONV (postoperative nausea and vomiting)   . Type 2 diabetes mellitus ([facility])     Family History  Problem Relation Age of Onset  . Heart disease Mother        CHF  . Physical abuse Mother   . Depression Mother   . Cancer Sister   . ADD / ADHD Sister   . Alcohol abuse Father   . Alcohol abuse Brother   . Mental illness Brother   . Cancer Sister   . Heart disease Sister   . Physical abuse Sister   . OCD Sister   .  Mental illness Sister   . Anxiety disorder Neg Hx   . Bipolar disorder Neg Hx   . Dementia Neg Hx   . Drug abuse Neg Hx   . Paranoid behavior Neg Hx   . Schizophrenia Neg Hx   . Seizures Neg Hx   . Sexual abuse Neg Hx     Past Surgical History:  Procedure Laterality Date  . BREAST SURGERY  2005   lumpectomy - left  . carpel tunnel Bilateral   . CESAREAN SECTION  1971  . COLONOSCOPY WITH PROPOFOL N/A 07/25/2016   Procedure: COLONOSCOPY WITH PROPOFOL;  Surgeon: Danie Binder, MD;  Location[facility];  Service: Endoscopy;  Laterality: N/A;  8:15 AM  . EYE SURGERY    . LUNG CANCER SURGERY Right 05/23/2012   reportedly clear  . TONSILLECTOMY    . TOTAL SHOULDER ARTHROPLASTY Left 08/27/2018   Procedure: LEFT TOTAL SHOULDER ARTHROPLASTY;  Surgeon: Meredith Pel, MD;  Location: [facility];  Service: Orthopedics;  Laterality: Left;  . TOTAL SHOULDER ARTHROPLASTY Right 04/15/2019   Procedure: RIGHT TOTAL SHOULDER ARTHROPLASTY;  Surgeon: Meredith Pel, MD;  Location: [facility];  Service: Orthopedics;  Laterality: Right;  . TUBAL LIGATION     Social History   Occupational History  . Occupation: retired    Comment: Engineer, water  Tobacco Use  . Smoking status: Current Every Day Smoker    Packs/day: 0.50    Years: 0.00    Pack years: 0.00    Types: Cigarettes  . Smokeless tobacco: Never Used  Substance and Sexual Activity  . Alcohol use: Yes    Comment: occasionally   . Drug use: Yes    Types: Marijuana    Comment: smoking daily  . Sexual activity: Never

## 2019-11-25 ENCOUNTER — Other Ambulatory Visit: Payer: Self-pay

## 2019-11-25 MED ORDER — METHOCARBAMOL 750 MG PO TABS: ORAL_TABLET | ORAL | 0 refills | Status: DC

## 2019-11-25 NOTE — Progress Notes (Signed)
Refill for robaxin submitted to pharmacy per [facility]

## 2020-01-09 DIAGNOSIS — M7061 Trochanteric bursitis, right hip: Secondary | ICD-10-CM | POA: Diagnosis not present

## 2020-02-16 ENCOUNTER — Other Ambulatory Visit: Payer: Self-pay | Admitting: Family Medicine

## 2020-03-02 NOTE — Telephone Encounter (Signed)
error 

## 2020-03-08 DIAGNOSIS — J441 Chronic obstructive pulmonary disease with (acute) exacerbation: Secondary | ICD-10-CM | POA: Diagnosis not present

## 2020-07-27 DIAGNOSIS — J069 Acute upper respiratory infection, unspecified: Secondary | ICD-10-CM | POA: Diagnosis not present

## 2020-10-28 ENCOUNTER — Other Ambulatory Visit (HOSPITAL_COMMUNITY): Payer: Self-pay | Admitting: Internal Medicine

## 2020-10-28 DIAGNOSIS — Z1231 Encounter for screening mammogram for malignant neoplasm of breast: Secondary | ICD-10-CM

## 2020-11-02 ENCOUNTER — Other Ambulatory Visit (HOSPITAL_COMMUNITY): Payer: Self-pay | Admitting: Family Medicine

## 2020-11-02 DIAGNOSIS — M25521 Pain in right elbow: Secondary | ICD-10-CM

## 2020-11-19 ENCOUNTER — Other Ambulatory Visit (HOSPITAL_COMMUNITY): Payer: Self-pay | Admitting: Family Medicine

## 2020-11-19 ENCOUNTER — Ambulatory Visit (HOSPITAL_COMMUNITY)
Admission: RE | Admit: 2020-11-19 | Discharge: 2020-11-19 | Disposition: A | Discharge status: Home / Self Care | Payer: Medicare Other | Source: Ambulatory Visit | Attending: Family Medicine | Admitting: Family Medicine

## 2020-11-19 ENCOUNTER — Other Ambulatory Visit (HOSPITAL_COMMUNITY): Payer: Self-pay | Admitting: Internal Medicine

## 2020-11-19 ENCOUNTER — Other Ambulatory Visit: Payer: Self-pay

## 2020-11-19 DIAGNOSIS — M25562 Pain in left knee: Secondary | ICD-10-CM | POA: Insufficient documentation

## 2020-12-08 ENCOUNTER — Ambulatory Visit (HOSPITAL_COMMUNITY)
Admission: RE | Admit: 2020-12-08 | Discharge: 2020-12-08 | Disposition: A | Discharge status: Home / Self Care | Payer: Medicare Other | Source: Ambulatory Visit | Attending: Internal Medicine | Admitting: Internal Medicine

## 2020-12-08 DIAGNOSIS — Z1231 Encounter for screening mammogram for malignant neoplasm of breast: Secondary | ICD-10-CM | POA: Insufficient documentation

## 2021-08-18 ENCOUNTER — Other Ambulatory Visit: Payer: Self-pay

## 2021-08-18 ENCOUNTER — Encounter (HOSPITAL_COMMUNITY): Payer: Self-pay | Admitting: Emergency Medicine

## 2021-08-18 ENCOUNTER — Emergency Department (HOSPITAL_COMMUNITY): Payer: Medicare Other

## 2021-08-18 ENCOUNTER — Emergency Department (HOSPITAL_COMMUNITY)
Admission: EM | Admit: 2021-08-18 | Discharge: 2021-08-18 | Disposition: A | Discharge status: Home / Self Care | Payer: Medicare Other | Attending: Emergency Medicine | Admitting: Emergency Medicine

## 2021-08-18 DIAGNOSIS — J449 Chronic obstructive pulmonary disease, unspecified: Secondary | ICD-10-CM | POA: Diagnosis not present

## 2021-08-18 DIAGNOSIS — W009XXA Unspecified fall due to ice and snow, initial encounter: Secondary | ICD-10-CM | POA: Diagnosis not present

## 2021-08-18 DIAGNOSIS — G8911 Acute pain due to trauma: Secondary | ICD-10-CM | POA: Diagnosis not present

## 2021-08-18 DIAGNOSIS — Z85118 Personal history of other malignant neoplasm of bronchus and lung: Secondary | ICD-10-CM | POA: Diagnosis not present

## 2021-08-18 DIAGNOSIS — Z7982 Long term (current) use of aspirin: Secondary | ICD-10-CM | POA: Insufficient documentation

## 2021-08-18 DIAGNOSIS — M25512 Pain in left shoulder: Secondary | ICD-10-CM | POA: Insufficient documentation

## 2021-08-18 DIAGNOSIS — Z7984 Long term (current) use of oral hypoglycemic drugs: Secondary | ICD-10-CM | POA: Diagnosis not present

## 2021-08-18 DIAGNOSIS — E119 Type 2 diabetes mellitus without complications: Secondary | ICD-10-CM | POA: Insufficient documentation

## 2021-08-18 MED ORDER — OXYCODONE-ACETAMINOPHEN 5-325 MG PO TABS: 1.0000 | ORAL_TABLET | Freq: Four times a day (QID) | ORAL | 0 refills | Status: AC | PRN

## 2021-08-18 MED ORDER — METHOCARBAMOL 500 MG PO TABS: 750.0000 mg | ORAL_TABLET | Freq: Two times a day (BID) | ORAL | 0 refills | Status: AC

## 2021-08-18 NOTE — ED Provider Notes (Signed)
[facility] EMERGENCY DEPARTMENT Provider Note   CSN: [id number] Arrival date & time: 08/18/21  1609     History  Chief Complaint  Patient presents with   Shoulder Injury    Deborah Mullins is a 71 y.o. female.  Medical history includes hyperlipidemia, stage I lung cancer, GERD, gastritis, COPD, bipolar, depression, diabetes type 2.  Patient presents after a fall 2 days ago where she slipped on ice and fell on her back left shoulder.  She did not hit her head or lose consciousness.  She is not on blood thinners.  She only complains of left shoulder pain.  She says the pain is very severe and feels like when she tore her rotator cuff previously.  She is unable to lift her arm above her head.  She is called her orthopedic doctor and they cannot get her in for 2 weeks.  She is currently on Norco at home for chronic pain and is not helping her symptoms at all.   Shoulder Injury      Home Medications Prior to Admission medications   Medication Sig Start Date End Date Taking? Authorizing Provider  methocarbamol (ROBAXIN) 500 MG tablet Take 1.5 tablets (750 mg total) by mouth 2 (two) times daily. 08/18/21 09/17/21 Yes Meridith Romick, Adora Fridge, PA-C  oxyCODONE-acetaminophen (PERCOCET/ROXICET) 5-325 MG tablet Take 1 tablet by mouth every 6 (six) hours as needed for severe pain. 08/18/21  Yes Mickael Mcnutt, Adora Fridge, PA-C  albuterol (PROVENTIL HFA;VENTOLIN HFA) 108 (90 Base) MCG/ACT inhaler Inhale 2 puffs into the lungs every 4 (four) hours as needed for wheezing or shortness of breath. 06/19/18   Delsa Grana, PA-C  aspirin 81 MG tablet Take 1 tablet (81 mg total) by mouth daily. 01/22/17   Dena Billet B, PA-C  blood glucose meter kit and supplies KIT Check up to twice a day before meals 04/18/17   Dixon, Lonie Peak, PA-C  buPROPion [facility] SR) 150 MG 12 hr tablet Take 1 tablet (150 mg total) by mouth 2 (two) times daily. 11/27/18   Delsa Grana, PA-C  cetirizine (ZYRTEC) 10 MG tablet Take 1 tablet (10 mg total) by  mouth daily. 11/27/18   Delsa Grana, PA-C  EPINEPHrine 0.3 mg/0.3 mL IJ SOAJ injection Inject 0.3 mLs (0.3 mg total) into the muscle as needed for anaphylaxis. 01/21/19   Delsa Grana, PA-C  FLUoxetine (PROZAC) 40 MG capsule Take 1 capsule (40 mg total) by mouth daily. 10/18/18   Delsa Grana, PA-C  fluticasone (FLONASE) 50 MCG/ACT nasal spray Place 2 sprays into both nostrils daily. Patient taking differently: Place 2 sprays into both nostrils daily as needed for allergies.  10/11/16   Eustaquio Maize, MD  gabapentin (NEURONTIN) 800 MG tablet Take 1 tablet (800 mg total) by mouth 2 (two) times daily. 11/27/18   Delsa Grana, PA-C  glucose blood test strip Check up to twice a day 01/23/17   Susy Frizzle, MD  Lancets [facility] ULTRASOFT) lancets Use as instructed 10/31/16   Eustaquio Maize, MD  losartan (COZAAR) 25 MG tablet Take 25 mg by mouth 2 (two) times daily.    [provider]  metFORMIN (GLUCOPHAGE) 500 MG tablet Take 1 tablet (500 mg total) by mouth 2 (two) times daily with a meal. 10/18/18   Delsa Grana, PA-C  NONFORMULARY OR COMPOUNDED ITEM Apply 1 application topically 2 (two) times daily as needed (foot pain). Ibuprofen 15%, Baclofen 2%-3%, Lidocaine Gaba Cream Base    [provider]  pantoprazole (PROTONIX) 40  MG tablet Take 1 tablet (40 mg total) by mouth daily. 11/27/18   Delsa Grana, PA-C  rosuvastatin (CRESTOR) 40 MG tablet Take 1 tablet (40 mg total) by mouth daily. 10/08/18   Susy Frizzle, MD      Allergies    Bee venom and Penicillins    Review of Systems   Review of Systems  Musculoskeletal:  Positive for arthralgias.  All other systems reviewed and are negative.  Physical Exam Updated Vital Signs BP (!) 152/98 (BP Location: Left Arm)    Pulse 88    Temp 98 F (36.7 C) (Oral)    Resp 20    Ht $R'5\' 3"'Mq$  (1.6 m)    Wt 68 kg    SpO2 100%    BMI 26.57 kg/m  Physical Exam Vitals and nursing note reviewed.  Constitutional:      General: She is not in  acute distress.    Appearance: Normal appearance. She is well-developed. She is not ill-appearing, toxic-appearing or diaphoretic.  HENT:     Head: Normocephalic and atraumatic.     Nose: No nasal deformity.     Mouth/Throat:     Lips: Pink. No lesions.  Eyes:     General: Gaze aligned appropriately. No scleral icterus.       Right eye: No discharge.        Left eye: No discharge.     Conjunctiva/sclera: Conjunctivae normal.     Right eye: Right conjunctiva is not injected. No exudate or hemorrhage.    Left eye: Left conjunctiva is not injected. No exudate or hemorrhage. Pulmonary:     Effort: Pulmonary effort is normal. No respiratory distress.  Musculoskeletal:     Right shoulder: Normal.     Left shoulder: Tenderness present. No swelling or deformity. Decreased range of motion.     Comments: There is no obvious swelling or deformity of left shoulder.  Patient does have decreased range of motion and cannot lift her left arm above her head.  She has normal grip strength and elbow strength.  Radial pulses 2+ bilaterally.  Sensation grossly intact distal to the injury.  She does have paraspinal tenderness palpation overlying the scapula.  No C-spine TTP or step-offs noted.  Skin:    General: Skin is warm and dry.  Neurological:     Mental Status: She is alert and oriented to person, place, and time.  Psychiatric:        Mood and Affect: Mood normal.        Speech: Speech normal.        Behavior: Behavior normal. Behavior is cooperative.    ED Results / Procedures / Treatments   Labs (all labs ordered are listed, but only abnormal results are displayed) Labs Reviewed - No data to display  EKG None  Radiology DG Shoulder Left  Result Date: 08/18/2021 CLINICAL DATA:  Fall left shoulder pain after fall. EXAM: LEFT SHOULDER - 2+ VIEW COMPARISON:  Radiograph dated October 29, 2019 FINDINGS: There is no evidence of fracture or dislocation. Status post reverse shoulder arthroplasty. No  perihardware loosening or evidence of hardware failure. There is no evidence of arthropathy or other focal bone abnormality. Soft tissues are unremarkable. IMPRESSION: Status post reverse shoulder arthroplasty without evidence of fracture or dislocation. Electronically Signed   By: Keane Police D.O.   On: 08/18/2021 17:17    Procedures Procedures   Medications Ordered in ED Medications - No data to display  ED Course/ Medical Decision Making/  A&P                           Medical Decision Making Amount and/or Complexity of Data Reviewed Radiology: ordered and independent interpretation performed. Decision-making details documented in ED Course.  Risk OTC drugs. Prescription drug management. Parenteral controlled substances.   This is a 71 y.o. female with a PMH of hyperlipidemia, stage I lung cancer, GERD, gastritis, COPD, bipolar, depression, diabetes type 2 who presents to the ED with left shoulder pain after a mechanical fall.   XR imaging negative. I suspect soft tissue injury of the rotator cuff. Antiinflammatories are contraindicated in this patient. I changed her home Norco to Percocet and provided with Robaxin prescription. Needs to f/u with ortho.  Portions of this note were generated with [employer]. Dictation errors may occur despite best attempts at proofreading.   Final Clinical Impression(s) / ED Diagnoses Final diagnoses:  Acute pain of left shoulder    Rx / DC Orders ED Discharge Orders          Ordered    methocarbamol (ROBAXIN) 500 MG tablet  2 times daily        08/18/21 1841    oxyCODONE-acetaminophen (PERCOCET/ROXICET) 5-325 MG tablet  Every 6 hours PRN        08/18/21 1841              Sheila Oats 08/18/21 2325    Milton Ferguson, MD 08/19/21 [phone]

## 2021-08-18 NOTE — ED Triage Notes (Signed)
Pt c/o left shoulder pain after falling x 2 days ago.

## 2021-08-18 NOTE — Discharge Instructions (Addendum)
Your Xray was negative for shoulder fracture or dislocation.  I have provided you with an orthopedic referral. Please schedule an appointment tomorrow.  You were given a prescription for Robaxin which is a muscle relaxer.  You should not drive, work, consume alcohol, or operate machinery while taking this medication as it can make you very drowsy.   I have changed your Norco prescription to Percocet which should be stronger than the prior medication. Please DO NOT take this along with the Norco. Please discontinue the Norco until otherwise directed from your PCP.

## 2021-11-08 ENCOUNTER — Other Ambulatory Visit (HOSPITAL_COMMUNITY): Payer: Self-pay | Admitting: Internal Medicine

## 2021-11-08 DIAGNOSIS — Z1231 Encounter for screening mammogram for malignant neoplasm of breast: Secondary | ICD-10-CM

## 2021-12-12 ENCOUNTER — Ambulatory Visit (HOSPITAL_COMMUNITY): Payer: Medicare Other

## 2021-12-26 ENCOUNTER — Ambulatory Visit (HOSPITAL_COMMUNITY)
Admission: RE | Admit: 2021-12-26 | Discharge: 2021-12-26 | Disposition: A | Discharge status: Home / Self Care | Payer: Medicare Other | Source: Ambulatory Visit | Attending: Internal Medicine | Admitting: Internal Medicine

## 2021-12-26 DIAGNOSIS — Z1231 Encounter for screening mammogram for malignant neoplasm of breast: Secondary | ICD-10-CM | POA: Insufficient documentation

## 2022-03-08 ENCOUNTER — Other Ambulatory Visit (HOSPITAL_COMMUNITY): Payer: Self-pay | Admitting: Internal Medicine

## 2022-03-08 ENCOUNTER — Other Ambulatory Visit: Payer: Self-pay | Admitting: Internal Medicine

## 2022-03-08 DIAGNOSIS — Z85118 Personal history of other malignant neoplasm of bronchus and lung: Secondary | ICD-10-CM

## 2022-04-19 ENCOUNTER — Ambulatory Visit (HOSPITAL_COMMUNITY)
Admission: RE | Admit: 2022-04-19 | Discharge: 2022-04-19 | Disposition: A | Discharge status: Home / Self Care | Payer: Medicare Other | Source: Ambulatory Visit | Attending: Internal Medicine | Admitting: Internal Medicine

## 2022-04-19 DIAGNOSIS — Z85118 Personal history of other malignant neoplasm of bronchus and lung: Secondary | ICD-10-CM | POA: Diagnosis not present

## 2022-08-17 ENCOUNTER — Other Ambulatory Visit: Payer: Self-pay

## 2022-08-17 ENCOUNTER — Emergency Department (HOSPITAL_COMMUNITY): Payer: Medicare Other

## 2022-08-17 ENCOUNTER — Emergency Department (HOSPITAL_COMMUNITY)
Admission: EM | Admit: 2022-08-17 | Discharge: 2022-08-17 | Disposition: A | Discharge status: Home / Self Care | Payer: Medicare Other | Attending: Emergency Medicine | Admitting: Emergency Medicine

## 2022-08-17 ENCOUNTER — Encounter (HOSPITAL_COMMUNITY): Payer: Self-pay | Admitting: Emergency Medicine

## 2022-08-17 DIAGNOSIS — M25521 Pain in right elbow: Secondary | ICD-10-CM | POA: Diagnosis not present

## 2022-08-17 DIAGNOSIS — M79601 Pain in right arm: Secondary | ICD-10-CM

## 2022-08-17 DIAGNOSIS — Z7982 Long term (current) use of aspirin: Secondary | ICD-10-CM | POA: Insufficient documentation

## 2022-08-17 DIAGNOSIS — Y92481 Parking lot as the place of occurrence of the external cause: Secondary | ICD-10-CM | POA: Diagnosis not present

## 2022-08-17 DIAGNOSIS — M25531 Pain in right wrist: Secondary | ICD-10-CM | POA: Insufficient documentation

## 2022-08-17 LAB — CBG MONITORING, ED: Glucose-Capillary: 123 mg/dL — ABNORMAL HIGH (ref 70–99)
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
## 2022-08-17 NOTE — Discharge Instructions (Signed)
Your xrays do not show any broken bones. Recommend alternating warm and cold compresses.  Take Tylenol as needed as directed. Recheck with your doctor if not improving in 3-5 days.
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
## 2022-08-18 ENCOUNTER — Ambulatory Visit
Admission: EM | Admit: 2022-08-18 | Discharge: 2022-08-18 | Discharge status: Against Medical Advice | Payer: Medicare Other

## 2022-08-18 ENCOUNTER — Other Ambulatory Visit: Payer: Self-pay

## 2022-08-18 ENCOUNTER — Encounter: Payer: Self-pay | Admitting: Emergency Medicine

## 2022-08-18 DIAGNOSIS — M25511 Pain in right shoulder: Secondary | ICD-10-CM | POA: Diagnosis not present
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
## 2022-08-18 NOTE — Discharge Instructions (Addendum)
Patient walked out of her appointment prior to exam being completed.
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
## 2022-08-18 NOTE — ED Notes (Signed)
Pt left during NP assessment.

## 2022-12-26 DIAGNOSIS — R062 Wheezing: Secondary | ICD-10-CM | POA: Diagnosis not present

## 2022-12-26 DIAGNOSIS — N3281 Overactive bladder: Secondary | ICD-10-CM | POA: Diagnosis not present

## 2022-12-26 DIAGNOSIS — T7840XS Allergy, unspecified, sequela: Secondary | ICD-10-CM | POA: Diagnosis not present

## 2023-01-23 DIAGNOSIS — E1122 Type 2 diabetes mellitus with diabetic chronic kidney disease: Secondary | ICD-10-CM | POA: Diagnosis not present

## 2023-01-23 DIAGNOSIS — N1831 Chronic kidney disease, stage 3a: Secondary | ICD-10-CM | POA: Diagnosis not present

## 2023-01-23 DIAGNOSIS — E782 Mixed hyperlipidemia: Secondary | ICD-10-CM | POA: Diagnosis not present

## 2023-01-28 ENCOUNTER — Other Ambulatory Visit: Payer: Self-pay

## 2023-01-28 ENCOUNTER — Emergency Department (HOSPITAL_COMMUNITY): Payer: Medicare Other

## 2023-01-28 ENCOUNTER — Emergency Department (HOSPITAL_COMMUNITY)
Admission: EM | Admit: 2023-01-28 | Discharge: 2023-01-28 | Disposition: A | Discharge status: Home / Self Care | Payer: Medicare Other | Attending: Emergency Medicine | Admitting: Emergency Medicine

## 2023-01-28 ENCOUNTER — Encounter (HOSPITAL_COMMUNITY): Payer: Self-pay

## 2023-01-28 DIAGNOSIS — Z853 Personal history of malignant neoplasm of breast: Secondary | ICD-10-CM | POA: Diagnosis not present

## 2023-01-28 DIAGNOSIS — Z79899 Other long term (current) drug therapy: Secondary | ICD-10-CM | POA: Insufficient documentation

## 2023-01-28 DIAGNOSIS — S20212A Contusion of left front wall of thorax, initial encounter: Secondary | ICD-10-CM | POA: Diagnosis not present

## 2023-01-28 DIAGNOSIS — I10 Essential (primary) hypertension: Secondary | ICD-10-CM | POA: Insufficient documentation

## 2023-01-28 DIAGNOSIS — Z85118 Personal history of other malignant neoplasm of bronchus and lung: Secondary | ICD-10-CM | POA: Diagnosis not present

## 2023-01-28 DIAGNOSIS — S2232XA Fracture of one rib, left side, initial encounter for closed fracture: Secondary | ICD-10-CM | POA: Diagnosis not present

## 2023-01-28 DIAGNOSIS — I700 Atherosclerosis of aorta: Secondary | ICD-10-CM | POA: Insufficient documentation

## 2023-01-28 DIAGNOSIS — Z7984 Long term (current) use of oral hypoglycemic drugs: Secondary | ICD-10-CM | POA: Insufficient documentation

## 2023-01-28 DIAGNOSIS — R079 Chest pain, unspecified: Secondary | ICD-10-CM | POA: Diagnosis not present

## 2023-01-28 DIAGNOSIS — W19XXXA Unspecified fall, initial encounter: Secondary | ICD-10-CM

## 2023-01-28 DIAGNOSIS — S0990XA Unspecified injury of head, initial encounter: Secondary | ICD-10-CM | POA: Diagnosis not present

## 2023-01-28 DIAGNOSIS — J449 Chronic obstructive pulmonary disease, unspecified: Secondary | ICD-10-CM | POA: Insufficient documentation

## 2023-01-28 DIAGNOSIS — Z96612 Presence of left artificial shoulder joint: Secondary | ICD-10-CM | POA: Diagnosis not present

## 2023-01-28 DIAGNOSIS — W010XXA Fall on same level from slipping, tripping and stumbling without subsequent striking against object, initial encounter: Secondary | ICD-10-CM | POA: Insufficient documentation

## 2023-01-28 DIAGNOSIS — M47812 Spondylosis without myelopathy or radiculopathy, cervical region: Secondary | ICD-10-CM | POA: Diagnosis not present

## 2023-01-28 DIAGNOSIS — M25512 Pain in left shoulder: Secondary | ICD-10-CM | POA: Diagnosis present

## 2023-01-28 DIAGNOSIS — R1012 Left upper quadrant pain: Secondary | ICD-10-CM | POA: Diagnosis not present

## 2023-01-28 DIAGNOSIS — E119 Type 2 diabetes mellitus without complications: Secondary | ICD-10-CM | POA: Insufficient documentation

## 2023-01-28 DIAGNOSIS — Z794 Long term (current) use of insulin: Secondary | ICD-10-CM | POA: Diagnosis not present

## 2023-01-28 DIAGNOSIS — Z471 Aftercare following joint replacement surgery: Secondary | ICD-10-CM | POA: Diagnosis not present

## 2023-01-28 LAB — CBC
HCT: 39.9 % (ref 36.0–46.0)
Hemoglobin: 12.3 g/dL (ref 12.0–15.0)
MCH: 29.9 pg (ref 26.0–34.0)
MCHC: 30.8 g/dL (ref 30.0–36.0)
MCV: 96.8 fL (ref 80.0–100.0)
Platelets: 205 10*3/uL (ref 150–400)
RBC: 4.12 MIL/uL (ref 3.87–5.11)
RDW: 13.5 % (ref 11.5–15.5)
WBC: 6.4 10*3/uL (ref 4.0–10.5)
nRBC: 0 % (ref 0.0–0.2)

## 2023-01-28 LAB — URINALYSIS, W/ REFLEX TO CULTURE (INFECTION SUSPECTED)
Bacteria, UA: NONE SEEN
Bilirubin Urine: NEGATIVE
Glucose, UA: NEGATIVE mg/dL
Ketones, ur: NEGATIVE mg/dL
Leukocytes,Ua: NEGATIVE
Nitrite: NEGATIVE
Protein, ur: 30 mg/dL — AB
Specific Gravity, Urine: 1.043 — ABNORMAL HIGH (ref 1.005–1.030)
pH: 6 (ref 5.0–8.0)

## 2023-01-28 LAB — BASIC METABOLIC PANEL
Anion gap: 7 (ref 5–15)
BUN: 22 mg/dL (ref 8–23)
CO2: 26 mmol/L (ref 22–32)
Calcium: 9.3 mg/dL (ref 8.9–10.3)
Chloride: 103 mmol/L (ref 98–111)
Creatinine, Ser: 1.23 mg/dL — ABNORMAL HIGH (ref 0.44–1.00)
GFR, Estimated: 47 mL/min — ABNORMAL LOW (ref >60)
Glucose, Bld: 167 mg/dL — ABNORMAL HIGH (ref 70–99)
Potassium: 3.9 mmol/L (ref 3.5–5.1)
Sodium: 136 mmol/L (ref 135–145)

## 2023-01-28 LAB — TROPONIN I (HIGH SENSITIVITY)
Troponin I (High Sensitivity): 2 ng/L (ref <18)
Troponin I (High Sensitivity): 2 ng/L (ref <18)

## 2023-01-28 MED ORDER — FENTANYL CITRATE PF 50 MCG/ML IJ SOSY
50.0000 ug | PREFILLED_SYRINGE | Freq: Once | INTRAMUSCULAR | Status: AC
  Administered 2023-01-28: 50 ug via INTRAVENOUS
  Filled 2023-01-28: qty 1

## 2023-01-28 MED ORDER — IOHEXOL 300 MG/ML  SOLN
80.0000 mL | Freq: Once | INTRAMUSCULAR | Status: AC | PRN
  Administered 2023-01-28: 80 mL via INTRAVENOUS

## 2023-01-28 MED ORDER — LIDOCAINE 5 % EX PTCH
1.0000 | MEDICATED_PATCH | CUTANEOUS | Status: DC
  Administered 2023-01-28: 1 via TRANSDERMAL
  Filled 2023-01-28: qty 1

## 2023-01-28 MED ORDER — METHYLPREDNISOLONE SODIUM SUCC 125 MG IJ SOLR
125.0000 mg | Freq: Once | INTRAMUSCULAR | Status: AC
  Administered 2023-01-28: 125 mg via INTRAVENOUS
  Filled 2023-01-28: qty 2

## 2023-01-28 MED ORDER — IPRATROPIUM-ALBUTEROL 0.5-2.5 (3) MG/3ML IN SOLN
3.0000 mL | Freq: Once | RESPIRATORY_TRACT | Status: AC
  Administered 2023-01-28: 3 mL via RESPIRATORY_TRACT
  Filled 2023-01-28: qty 3

## 2023-01-28 NOTE — ED Provider Notes (Signed)
[place] EMERGENCY DEPARTMENT AT [facility] Provider Note   CSN: [id number] Arrival date & time: 01/28/23  1159     History Chief Complaint  Patient presents with   Deborah Mullins    AMI Deborah Mullins is a 72 y.o. female with history of COPD, breast and lung cancer in remission, hypertension, diabetes, bipolar presents the emergency department today for evaluation of left-sided flank pain and left shoulder pain after the fall.  Patient reports that yesterday she was trying to put a leash on her dog when the leash was taken up and her feet and when the dog ran 1 way and made her fall.  She thought that she fell onto her left knee but started having pain in her left side of her flank last night.  She does not think she hit her head but also reports that she does not remember much of the fall so it thinks that she may have lost consciousness however she is not for sure.  She does not take any blood thinners.  She reports the pain is worse with movement and worse with taking deep breaths in.  No pain medication taken prior to arrival.   Fall Associated symptoms include chest pain and shortness of breath (At baseline). Pertinent negatives include no headaches.       Home Medications Prior to Admission medications   Medication Sig Start Date End Date Taking? Authorizing Provider  albuterol (PROVENTIL HFA;VENTOLIN HFA) 108 (90 Base) MCG/ACT inhaler Inhale 2 puffs into the lungs every 4 (four) hours as needed for wheezing or shortness of breath. 06/19/18   Danelle Berry, PA-C  aspirin 81 MG tablet Take 1 tablet (81 mg total) by mouth daily. 01/22/17   Allayne Butcher B, PA-C  blood glucose meter kit and supplies KIT Check up to twice a day before meals 04/18/17   Dixon, Patriciaann Clan, PA-C  buPROPion [facility] SR) 150 MG 12 hr tablet Take 1 tablet (150 mg total) by mouth 2 (two) times daily. 11/27/18   Danelle Berry, PA-C  cetirizine (ZYRTEC) 10 MG tablet Take 1 tablet (10 mg total) by mouth daily. 11/27/18    Danelle Berry, PA-C  EPINEPHrine 0.3 mg/0.3 mL IJ SOAJ injection Inject 0.3 mLs (0.3 mg total) into the muscle as needed for anaphylaxis. 01/21/19   Danelle Berry, PA-C  FLUoxetine (PROZAC) 40 MG capsule Take 1 capsule (40 mg total) by mouth daily. 10/18/18   Danelle Berry, PA-C  fluticasone (FLONASE) 50 MCG/ACT nasal spray Place 2 sprays into both nostrils daily. Patient taking differently: Place 2 sprays into both nostrils daily as needed for allergies.  10/11/16   Johna Sheriff, MD  gabapentin (NEURONTIN) 800 MG tablet Take 1 tablet (800 mg total) by mouth 2 (two) times daily. 11/27/18   Danelle Berry, PA-C  glucose blood test strip Check up to twice a day 01/23/17   Donita Brooks, MD  Lancets [facility] ULTRASOFT) lancets Use as instructed 10/31/16   Johna Sheriff, MD  losartan (COZAAR) 25 MG tablet Take 25 mg by mouth 2 (two) times daily.    [provider]  metFORMIN (GLUCOPHAGE) 500 MG tablet Take 1 tablet (500 mg total) by mouth 2 (two) times daily with a meal. 10/18/18   Danelle Berry, PA-C  NONFORMULARY OR COMPOUNDED ITEM Apply 1 application topically 2 (two) times daily as needed (foot pain). Ibuprofen 15%, Baclofen 2%-3%, Lidocaine Gaba Cream Base    [provider]  oxyCODONE-acetaminophen (PERCOCET/ROXICET) 5-325 MG tablet Take 1  tablet by mouth every 6 (six) hours as needed for severe pain. 08/18/21   Loeffler, Finis Bud, PA-C  pantoprazole (PROTONIX) 40 MG tablet Take 1 tablet (40 mg total) by mouth daily. 11/27/18   Danelle Berry, PA-C  rosuvastatin (CRESTOR) 40 MG tablet Take 1 tablet (40 mg total) by mouth daily. 10/08/18   Donita Brooks, MD      Allergies    Bee venom and Penicillins    Review of Systems   Review of Systems  Constitutional:  Negative for chills and fever.  Eyes:  Negative for visual disturbance.  Respiratory:  Positive for shortness of breath (At baseline). Negative for cough.   Cardiovascular:  Positive for chest pain.  Musculoskeletal:   Positive for arthralgias. Negative for back pain and neck pain.  Neurological:  Negative for headaches.    Physical Exam Updated Vital Signs BP (!) 136/93 (BP Location: Right Arm)   Pulse 88   Temp 97.8 F (36.6 C) (Oral)   Resp 16   Ht 5\' 3"  (1.6 m)   Wt 68 kg   SpO2 96%   BMI 26.57 kg/m  Physical Exam Vitals and nursing note reviewed.  Constitutional:      General: She is not in acute distress.    Appearance: She is not ill-appearing or toxic-appearing.  HENT:     Head: Normocephalic and atraumatic.     Mouth/Throat:     Mouth: Mucous membranes are moist.  Eyes:     General: No scleral icterus.    Extraocular Movements: Extraocular movements intact.     Pupils: Pupils are equal, round, and reactive to light.  Cardiovascular:     Rate and Rhythm: Normal rate.     Pulses:          Radial pulses are 2+ on the right side and 2+ on the left side.       Dorsalis pedis pulses are 2+ on the right side and 2+ on the left side.       Posterior tibial pulses are 2+ on the right side and 2+ on the left side.  Pulmonary:     Effort: Pulmonary effort is normal. No respiratory distress.     Comments: Significant expiratory wheezing heard throughout all lung fields. Chest:       Comments: Tenderness to the marked area above.  No obvious step-off or deformity.  No overlying skin changes or bruising noted.  No rash.  No induration or fluctuance.  No increased erythema or warmth. Abdominal:     Palpations: Abdomen is soft.     Tenderness: There is no guarding or rebound.  Musculoskeletal:        General: Tenderness present.     Cervical back: Normal range of motion. No rigidity or tenderness.     Right lower leg: No edema.     Left lower leg: No edema.     Comments: Tenderness to the more anterior portion of the left shoulder.  No obvious deformities noted.  No overlying skin changes noted.  Compartments are soft.  Neurovascular intact distally.  Palpable pulses.  Good grip strength.   Strength is intact in bilateral upper and lower extremities.  Skin:    General: Skin is warm and dry.  Neurological:     General: No focal deficit present.     Mental Status: She is alert. Mental status is at baseline.     Cranial Nerves: No cranial nerve deficit.     Sensory: No sensory  deficit.     Motor: No weakness.     ED Results / Procedures / Treatments   Labs (all labs ordered are listed, but only abnormal results are displayed) Labs Reviewed  BASIC METABOLIC PANEL - Abnormal; Notable for the following components:      Result Value   Glucose, Bld 167 (*)    Creatinine, Ser 1.23 (*)    GFR, Estimated 47 (*)    All other components within normal limits  CBC  TROPONIN I (HIGH SENSITIVITY)  TROPONIN I (HIGH SENSITIVITY)    EKG EKG Interpretation  Date/Time:  Sunday January 28 2023 12:11:28 EDT Ventricular Rate:  83 PR Interval:  152 QRS Duration: 76 QT Interval:  370 QTC Calculation: 434 R Axis:   19 Text Interpretation: Normal sinus rhythm Normal ECG When compared with ECG of 18-Aug-2021 16:36, No significant change was found No significant change since last tracing Confirmed by Jacalyn Lefevre [id number]) on 01/28/2023 1:36:08 PM  Radiology CT Cervical Spine Wo Contrast  Result Date: 01/28/2023 CLINICAL DATA:  Trauma, fall EXAM: CT CERVICAL SPINE WITHOUT CONTRAST TECHNIQUE: Multidetector CT imaging of the cervical spine was performed without intravenous contrast. Multiplanar CT image reconstructions were also generated. RADIATION DOSE REDUCTION: This exam was performed according to the departmental dose-optimization program which includes automated exposure control, adjustment of the mA and/or kV according to patient size and/or use of iterative reconstruction technique. COMPARISON:  Previous MR cervical spine done on 08/18/2015 FINDINGS: Alignment: There is minimal 1-2 mm anterolisthesis at C3-C4 level which may be due to previous ligament injury and facet degeneration.  There is minimal anterolisthesis at T1-T2 level. Skull base and vertebrae: No recent fracture is seen. There are smooth marginated calcifications posterior to the spinous processes of C6 and C7 vertebrae suggesting ligament calcifications from previous injury. Soft tissues and spinal canal: There is no central spinal stenosis. Disc levels: There is encroachment of neural foramina by bony spurs and facet hypertrophy from C2 to C7 levels. Upper chest: No acute findings are seen. Other: None. IMPRESSION: No recent fracture is seen in cervical spine. Cervical spondylosis with encroachment of neural foramina from C2-C7 levels. Electronically Signed   By: Ernie Avena M.D.   On: 01/28/2023 16:55   CT Head Wo Contrast  Result Date: 01/28/2023 CLINICAL DATA:  Trauma, fall EXAM: CT HEAD WITHOUT CONTRAST TECHNIQUE: Contiguous axial images were obtained from the base of the skull through the vertex without intravenous contrast. RADIATION DOSE REDUCTION: This exam was performed according to the departmental dose-optimization program which includes automated exposure control, adjustment of the mA and/or kV according to patient size and/or use of iterative reconstruction technique. COMPARISON:  08/14/2014 FINDINGS: Brain: No acute intracranial findings are seen. There are no signs of bleeding within the cranium. Ventricles are not dilated. There is no focal edema or mass effect. Vascular: Scattered arterial calcifications are seen. Skull: No fracture is seen in calvarium. Sinuses/Orbits: There is mucosal thickening in ethmoid sinus. Other: None. IMPRESSION: No acute intracranial findings are seen in noncontrast CT brain. Electronically Signed   By: Ernie Avena M.D.   On: 01/28/2023 16:48   DG Shoulder Left  Result Date: 01/28/2023 CLINICAL DATA:  Trauma, fall EXAM: LEFT SHOULDER - 2+ VIEW COMPARISON:  08/18/2021 FINDINGS: There is previous reverse arthroplasty in left shoulder. No recent fracture or  dislocation is seen. IMPRESSION: Previous left shoulder arthroplasty. No recent fracture or dislocation is seen. Electronically Signed   By: Ernie Avena M.D.   On: 01/28/2023 15:10  DG Chest 2 View  Result Date: 01/28/2023 CLINICAL DATA:  Fall on left side yesterday.  Left-sided pain. EXAM: CHEST - 2 VIEW COMPARISON:  Two-view chest x-ray 10/11/2019 FINDINGS: The heart size is normal. Lung volumes are low. No focal airspace disease is present. No edema effusion is present. Bilateral shoulder arthroplasty is noted. IMPRESSION: 1. Low lung volumes. 2. No acute cardiopulmonary disease. Electronically Signed   By: Marin Roberts M.D.   On: 01/28/2023 12:39    Procedures Procedures    Medications Ordered in ED Medications  lidocaine (LIDODERM) 5 % 1 patch (1 patch Transdermal Patch Applied 01/28/23 1510)  ipratropium-albuterol (DUONEB) 0.5-2.5 (3) MG/3ML nebulizer solution 3 mL (3 mLs Nebulization Given 01/28/23 1454)  methylPREDNISolone sodium succinate (SOLU-MEDROL) 125 mg/2 mL injection 125 mg (125 mg Intravenous Given 01/28/23 1502)  fentaNYL (SUBLIMAZE) injection 50 mcg (50 mcg Intravenous Given 01/28/23 1503)  iohexol (OMNIPAQUE) 300 MG/ML solution 80 mL (80 mLs Intravenous Contrast Given 01/28/23 1609)    ED Course/ Medical Decision Making/ A&P                         Medical Decision Making Amount and/or Complexity of Data Reviewed Labs: ordered. Radiology: ordered.  Risk Prescription drug management.   72 y.o. female presents to the ER for evaluation of left flank/chest pain after fall. Differential diagnosis includes but is not limited to intracranial trauma, intrathoracic trauma, intra-abdominal trauma. Vital signs show elevated blood pressure 136/93 otherwise unremarkable. Physical exam as noted above.   I ordered the patient a DuoNeb and Solu-Medrol given her expiratory wheezing however she reports her shortness of breath is at its baseline.  Did give her some  fentanyl and lidocaine patches for pain.  I independently reviewed and interpreted the patient's labs.  BMP shows slightly elevated glucose at 167.  Creatinine at 1.23.  CBC without cytosis or anemia.  Troponin at 2.  No other electrolyte abnormalities. Creatinine appears to be around 1.07, creatinine today slightly more elevated but not by much.  X-ray of the left shoulder shows Previous left shoulder arthroplasty. No recent fracture or dislocation is seen.   Chest x-ray shows 1. Low lung volumes. 2. No acute cardiopulmonary disease.  Given the patient's age as well as history of cancers, will order CT imaging of the chest abdomen pelvis to rule out any intrathoracic or intra-abdominal trauma.  I suspect is likely rib contusions or fractures causing her pain.  CT head and neck show No acute intracranial findings are seen in noncontrast CT brain. No recent fracture is seen in cervical spine. Cervical spondylosis with encroachment of neural foramina from C2-C7 levels.  CT imaging of the chest abdomen pelvis shows***.  We discussed the results of the labs/imaging. The plan is ***. We discussed strict return precautions and red flag symptoms. The patient verbalized their understanding and agrees to the plan. The patient is stable and being discharged home in good condition.  Portions of this report may have been transcribed using voice recognition software. Every effort was made to ensure accuracy; however, inadvertent computerized transcription errors may be present.   Final Clinical Impression(s) / ED Diagnoses Final diagnoses:  None    Rx / DC Orders ED Discharge Orders     None

## 2023-01-28 NOTE — ED Notes (Signed)
Respiratory to bring incentive spirometer.

## 2023-01-28 NOTE — ED Triage Notes (Signed)
Pt fell on her left side yesterday. Pt got tangled in her dogs leash and fell on her back/left side. Pain to her left side, under breast, ribs, left shoulder. Pt states she is not on blood thinners.

## 2023-01-28 NOTE — Discharge Instructions (Addendum)
You were seen in the ER today for evaluation of you side pain after your fall. You have broken one of your ribs. Rib fractures increase your risk for pneumonia or atelectasis. Because of this, you will need to use your incentive spirometer once and hour every hour while awake at least for the next two weeks. I recommend lidocaine patches over the area. You can try 500mg  of Tylenol and/or 600mg  of ibuprofen every 6 hours as needed for pain. Please take your narcotic pain medication as prescribed. Please do not driver or operate any heavy machinery as it will make you sleepy. Please make sure to not do any strenuous lifting or activity. Please follow up with your PCP regarding your COPD and rib fractures. If you have any concerns, new or worsening symptoms, please return to the nearest ER for re-evaluation.   Contact a doctor if: You have a fever. Get help right away if: You have trouble breathing. You are short of breath. You cannot stop coughing. You cough up thick or bloody spit. You feel like you may vomit (nauseous), vomit, or have belly (abdominal) pain. Your pain gets worse and medicine does not help. These symptoms may be an emergency. Get help right away. Call your local emergency services (911 in the [place] Do not wait to see if the symptoms will go away. Do not drive yourself to the hospital.

## 2023-01-30 DIAGNOSIS — E1122 Type 2 diabetes mellitus with diabetic chronic kidney disease: Secondary | ICD-10-CM | POA: Diagnosis not present

## 2023-01-30 DIAGNOSIS — S2232XD Fracture of one rib, left side, subsequent encounter for fracture with routine healing: Secondary | ICD-10-CM | POA: Diagnosis not present

## 2023-01-30 DIAGNOSIS — K219 Gastro-esophageal reflux disease without esophagitis: Secondary | ICD-10-CM | POA: Diagnosis not present

## 2023-01-30 DIAGNOSIS — T7840XD Allergy, unspecified, subsequent encounter: Secondary | ICD-10-CM | POA: Diagnosis not present

## 2023-01-30 DIAGNOSIS — N1831 Chronic kidney disease, stage 3a: Secondary | ICD-10-CM | POA: Diagnosis not present

## 2023-01-30 DIAGNOSIS — W19XXXD Unspecified fall, subsequent encounter: Secondary | ICD-10-CM | POA: Diagnosis not present

## 2023-01-30 DIAGNOSIS — R809 Proteinuria, unspecified: Secondary | ICD-10-CM | POA: Diagnosis not present

## 2023-01-30 DIAGNOSIS — T7840XS Allergy, unspecified, sequela: Secondary | ICD-10-CM | POA: Diagnosis not present

## 2023-01-30 DIAGNOSIS — E1142 Type 2 diabetes mellitus with diabetic polyneuropathy: Secondary | ICD-10-CM | POA: Diagnosis not present

## 2023-01-30 DIAGNOSIS — R062 Wheezing: Secondary | ICD-10-CM | POA: Diagnosis not present

## 2023-01-30 DIAGNOSIS — N3281 Overactive bladder: Secondary | ICD-10-CM | POA: Diagnosis not present

## 2023-01-30 DIAGNOSIS — E782 Mixed hyperlipidemia: Secondary | ICD-10-CM | POA: Diagnosis not present

## 2023-01-30 DIAGNOSIS — J449 Chronic obstructive pulmonary disease, unspecified: Secondary | ICD-10-CM | POA: Diagnosis not present

## 2023-01-30 DIAGNOSIS — I10 Essential (primary) hypertension: Secondary | ICD-10-CM | POA: Diagnosis not present

## 2023-01-30 DIAGNOSIS — I129 Hypertensive chronic kidney disease with stage 1 through stage 4 chronic kidney disease, or unspecified chronic kidney disease: Secondary | ICD-10-CM | POA: Diagnosis not present

## 2023-01-30 DIAGNOSIS — E1140 Type 2 diabetes mellitus with diabetic neuropathy, unspecified: Secondary | ICD-10-CM | POA: Diagnosis not present

## 2023-02-13 ENCOUNTER — Other Ambulatory Visit (HOSPITAL_COMMUNITY): Payer: Self-pay | Admitting: Internal Medicine

## 2023-02-13 DIAGNOSIS — Z1231 Encounter for screening mammogram for malignant neoplasm of breast: Secondary | ICD-10-CM

## 2023-04-23 ENCOUNTER — Ambulatory Visit (HOSPITAL_COMMUNITY)
Admission: RE | Admit: 2023-04-23 | Discharge: 2023-04-23 | Disposition: A | Discharge status: Home / Self Care | Payer: Medicare Other | Source: Ambulatory Visit | Attending: Internal Medicine | Admitting: Internal Medicine

## 2023-04-23 DIAGNOSIS — Z1231 Encounter for screening mammogram for malignant neoplasm of breast: Secondary | ICD-10-CM | POA: Diagnosis not present

## 2023-06-06 DIAGNOSIS — N1831 Chronic kidney disease, stage 3a: Secondary | ICD-10-CM | POA: Diagnosis not present

## 2023-06-06 DIAGNOSIS — E782 Mixed hyperlipidemia: Secondary | ICD-10-CM | POA: Diagnosis not present

## 2023-06-06 DIAGNOSIS — E1122 Type 2 diabetes mellitus with diabetic chronic kidney disease: Secondary | ICD-10-CM | POA: Diagnosis not present

## 2023-06-12 DIAGNOSIS — R062 Wheezing: Secondary | ICD-10-CM | POA: Diagnosis not present

## 2023-06-12 DIAGNOSIS — E1122 Type 2 diabetes mellitus with diabetic chronic kidney disease: Secondary | ICD-10-CM | POA: Diagnosis not present

## 2023-06-12 DIAGNOSIS — T7840XS Allergy, unspecified, sequela: Secondary | ICD-10-CM | POA: Diagnosis not present

## 2023-06-12 DIAGNOSIS — N3281 Overactive bladder: Secondary | ICD-10-CM | POA: Diagnosis not present

## 2023-06-12 DIAGNOSIS — E782 Mixed hyperlipidemia: Secondary | ICD-10-CM | POA: Diagnosis not present

## 2023-06-12 DIAGNOSIS — J302 Other seasonal allergic rhinitis: Secondary | ICD-10-CM | POA: Diagnosis not present

## 2023-06-12 DIAGNOSIS — R809 Proteinuria, unspecified: Secondary | ICD-10-CM | POA: Diagnosis not present

## 2023-06-12 DIAGNOSIS — K219 Gastro-esophageal reflux disease without esophagitis: Secondary | ICD-10-CM | POA: Diagnosis not present

## 2023-06-12 DIAGNOSIS — K5909 Other constipation: Secondary | ICD-10-CM | POA: Diagnosis not present

## 2023-06-12 DIAGNOSIS — N189 Chronic kidney disease, unspecified: Secondary | ICD-10-CM | POA: Diagnosis not present

## 2023-06-12 DIAGNOSIS — I10 Essential (primary) hypertension: Secondary | ICD-10-CM | POA: Diagnosis not present

## 2023-10-12 DIAGNOSIS — E1122 Type 2 diabetes mellitus with diabetic chronic kidney disease: Secondary | ICD-10-CM | POA: Diagnosis not present

## 2023-10-12 DIAGNOSIS — E782 Mixed hyperlipidemia: Secondary | ICD-10-CM | POA: Diagnosis not present

## 2023-11-09 DIAGNOSIS — I10 Essential (primary) hypertension: Secondary | ICD-10-CM | POA: Diagnosis not present

## 2023-11-09 DIAGNOSIS — N3281 Overactive bladder: Secondary | ICD-10-CM | POA: Diagnosis not present

## 2023-11-09 DIAGNOSIS — K5909 Other constipation: Secondary | ICD-10-CM | POA: Diagnosis not present

## 2023-11-09 DIAGNOSIS — E1140 Type 2 diabetes mellitus with diabetic neuropathy, unspecified: Secondary | ICD-10-CM | POA: Diagnosis not present

## 2023-11-09 DIAGNOSIS — J449 Chronic obstructive pulmonary disease, unspecified: Secondary | ICD-10-CM | POA: Diagnosis not present

## 2023-11-09 DIAGNOSIS — R296 Repeated falls: Secondary | ICD-10-CM | POA: Diagnosis not present

## 2023-11-09 DIAGNOSIS — R809 Proteinuria, unspecified: Secondary | ICD-10-CM | POA: Diagnosis not present

## 2023-11-09 DIAGNOSIS — E1122 Type 2 diabetes mellitus with diabetic chronic kidney disease: Secondary | ICD-10-CM | POA: Diagnosis not present

## 2023-11-09 DIAGNOSIS — J019 Acute sinusitis, unspecified: Secondary | ICD-10-CM | POA: Diagnosis not present

## 2023-11-09 DIAGNOSIS — E782 Mixed hyperlipidemia: Secondary | ICD-10-CM | POA: Diagnosis not present

## 2023-11-09 DIAGNOSIS — K219 Gastro-esophageal reflux disease without esophagitis: Secondary | ICD-10-CM | POA: Diagnosis not present

## 2023-11-09 DIAGNOSIS — M545 Low back pain, unspecified: Secondary | ICD-10-CM | POA: Diagnosis not present

## 2023-12-12 ENCOUNTER — Other Ambulatory Visit: Payer: Self-pay

## 2023-12-12 ENCOUNTER — Ambulatory Visit (HOSPITAL_COMMUNITY): Attending: Internal Medicine

## 2023-12-12 ENCOUNTER — Encounter (HOSPITAL_COMMUNITY): Payer: Self-pay

## 2023-12-12 DIAGNOSIS — M6281 Muscle weakness (generalized): Secondary | ICD-10-CM | POA: Insufficient documentation

## 2023-12-12 DIAGNOSIS — Z7409 Other reduced mobility: Secondary | ICD-10-CM | POA: Insufficient documentation

## 2023-12-12 NOTE — Therapy (Signed)
 OUTPATIENT PHYSICAL THERAPY LOWER EXTREMITY EVALUATION   Patient Name: Deborah Mullins MRN: [id number] DOB:1951-05-31, 73 y.o., female Today's Date: 12/12/2023  END OF SESSION:  PT End of Session - 12/12/23 1258     Visit Number 1    Number of Visits 6    Date for PT Re-Evaluation 01/23/24    Authorization Type [employer] Medicare    Authorization Time Period Auth requested    Progress Note Due on Visit 6    PT Start Time 1309    PT Stop Time 1346    PT Time Calculation (min) 37 min    Activity Tolerance Patient tolerated treatment well    Behavior During Therapy WFL for tasks assessed/performed             Past Medical History:  Diagnosis Date   Allergic rhinitis    Anxiety    Arthritis    Bipolar 1 disorder (HCC)    Breast cancer (HCC) 2004   Carpal tunnel syndrome of left wrist    Chronic neck pain    Chronic shoulder pain    L>R   Depression    GERD (gastroesophageal reflux disease)    Headache(784.0)    History of lung surgery    Hyperlipidemia    Hypertension    Lung cancer (HCC)    Lung nodule 2009   Osteoarthritis of right shoulder    Pain in both feet    Personal history of radiation therapy 2004   PONV (postoperative nausea and vomiting)    Type 2 diabetes mellitus (HCC)    Past Surgical History:  Procedure Laterality Date   BREAST SURGERY  2005   lumpectomy - left   carpel tunnel Bilateral    CESAREAN SECTION  1971   COLONOSCOPY WITH PROPOFOL  N/A 07/25/2016   Procedure: COLONOSCOPY WITH PROPOFOL ;  Surgeon: Alyce Jubilee, MD;  Location: [facility];  Service: Endoscopy;  Laterality: N/A;  8:15 AM   EYE SURGERY     LUNG CANCER SURGERY Right 05/23/2012   reportedly clear   TONSILLECTOMY     TOTAL SHOULDER ARTHROPLASTY Left 08/27/2018   Procedure: LEFT TOTAL SHOULDER ARTHROPLASTY;  Surgeon: Jasmine Mesi, MD;  Location[facility];  Service: Orthopedics;  Laterality: Left;   TOTAL SHOULDER ARTHROPLASTY Right 04/15/2019   Procedure: RIGHT TOTAL  SHOULDER ARTHROPLASTY;  Surgeon: Jasmine Mesi, MD;  Location[facility] OR;  Service: Orthopedics;  Laterality: Right;   TUBAL LIGATION     Patient Active Problem List   Diagnosis Date Noted   OA (osteoarthritis) of shoulder 04/15/2019   Shoulder arthritis 08/27/2018   Primary osteoarthritis, left shoulder 04/01/2018   Primary osteoarthritis, right shoulder 04/01/2018   Essential hypertension 01/22/2017   Controlled type 2 diabetes mellitus without complication, without long-term current use of insulin (HCC) 11/23/2016   Chronic pain of both shoulders 07/24/2016   Prediabetes    Anxiety    Bipolar 1 disorder (HCC)    Depression    Chronic back pain 11/07/2012   Insomnia secondary to depression with anxiety 09/23/2012   Vitamin D  deficiency 09/23/2012   COPD, mild (HCC) 07/01/2012   Acute bronchitis 06/08/2012   Adenocarcinoma of lung, stage 1 (HCC) 05/23/2012   RECTAL BLEEDING 04/27/2010   Hemorrhoids 03/11/2009   Malignant neoplasm of female breast (HCC) 12/15/2008   FIBROIDS, UTERUS 12/15/2008   HYPERCHOLESTEROLEMIA 12/15/2008   Hyperlipidemia 12/15/2008   Anxiety and depression 12/15/2008   GASTROESOPHAGEAL REFLUX DISEASE 12/15/2008   Gastritis and gastroduodenitis 12/15/2008  Constipation 12/15/2008   FLATULENCE ERUCTATION AND GAS PAIN 12/15/2008   Abdominal pain 12/15/2008   RECTAL BLEEDING, HX OF 12/15/2008    PCP: Omie Bickers, MD  REFERRING PROVIDER: Omie Bickers, MD  REFERRING DIAG: G62.9 (ICD-10-CM) - Polyneuropathy, unspecified  Rationale for Evaluation and Treatment: Rehabilitation  THERAPY DIAG:  Muscle weakness (generalized)  Impaired functional mobility, balance, gait, and endurance  ONSET DATE: Been having falls a year or so  SUBJECTIVE:                                                                                                                                                                                           SUBJECTIVE STATEMENT: Pt  unsure why she was referred for PT. Pt reports she falls a lot. Reports she just fell yesterday. Reports her dogs make her fall sometimes. She reports she has arthritis really bad so she has some pain all over. Reports shoulders as worst pain. But really at night after putting pressure on her shoulders. She reports intermittent shaking of her hands sometimes and she is fearful that they are mini strokes. Reports she sometimes gets dry mouth, goes to the bathroom a lot and feels that it may be due to her medication. Reports no use of RW and SPC.  PERTINENT HISTORY:  Rotator Cuff Repair  PAIN:  Are you having pain? No  PRECAUTIONS: None  RED FLAGS: None   WEIGHT BEARING RESTRICTIONS: No  FALLS:  Has patient fallen in last 6 months? Yes. Number of falls Around 10  OCCUPATION: Retired  PLOF: Independent  PATIENT GOALS: "Unsure, not sure why she's in PT but probably because she falls"  NEXT MD VISIT: 6 months  OBJECTIVE:  Note: Objective measures were completed at Evaluation unless otherwise noted.  DIAGNOSTIC FINDINGS:  N/A  PATIENT SURVEYS:  ABC scale Total ABC score: 1110 / 1600 = 69.4 %  COGNITION: Overall cognitive status: Within functional limits for tasks assessed     SENSATION: Light touch: Impaired  Dec sens. on R L5   POSTURE: rounded shoulders, forward head, and increased thoracic kyphosis  PALPATION:   LUMBAR ROM:   AROM eval  Flexion   Extension   Right lateral flexion   Left lateral flexion   Right rotation   Left rotation    (Blank rows = not tested)  LOWER EXTREMITY ROM:       Right eval Left eval  Hip flexion    Hip extension    Hip abduction    Hip adduction    Hip internal rotation    Hip external rotation    Knee flexion  Knee extension    Ankle dorsiflexion    Ankle plantarflexion    Ankle inversion    Ankle eversion     (Blank rows = not tested)  LOWER EXTREMITY MMT:    MMT Right eval Left eval  Hip flexion 4- 4-   Hip extension    Hip abduction    Hip adduction    Hip internal rotation    Hip external rotation    Knee flexion 4- 4-  Knee extension 4- 4-  Ankle dorsiflexion 5 5  Ankle plantarflexion    Ankle inversion    Ankle eversion     (Blank rows = not tested)  SPECIAL TESTS:  DF Clonus Reflex: Mild   FUNCTIONAL TESTS:  30 seconds chair stand test 7 STS Tandem Stance: RLE leading: 2"        LLE leading: 10" SLS: 15" bilat.    GAIT: Distance walked: 100 Assistive device utilized: None Level of assistance: Complete Independence Comments: Pt demo forward posture, w/ rounded shoulders, dec velocity, and mild drifting during ambulation. No overt LOB  TREATMENT DATE:  12/12/23:  PT Evaluation and HEP                                                                                                                                 PATIENT EDUCATION:  Education details: PT evaluation, objective findings, POC, Importance of HEP, Precautions, Clinic policies  Person educated: Patient Education method: Explanation and Demonstration Education comprehension: verbalized understanding and returned demonstration  HOME EXERCISE PROGRAM: Access Code: [email or web address] URL: [email or web address] Date: 12/12/2023 Prepared by: Virgia Griffins Powell-Butler  Exercises - Supine Bridge  - 2 x daily - 7 x weekly - 2 sets - 10 reps - 5 hold - Sit to Stand  - 2 x daily - 7 x weekly - 2 sets - 10 reps - Seated Long Arc Quad  - 2 x daily - 7 x weekly - 3 sets - 10 reps - Standing Tandem Balance with Counter Support  - 2 x daily - 7 x weekly - 3 sets - 30 hold - Single Leg Stance with Support  - 2 x daily - 7 x weekly - 3 sets - 30 hold  ASSESSMENT:  CLINICAL IMPRESSION: Patient is a 73 y.o. female who was seen today for physical therapy evaluation and treatment for G62.9 (ICD-10-CM) - Polyneuropathy, unspecified. Some time missed during session due to patient on a call with insurance at the start.  Patient was unsure of why she was referred to PT but later revealed she was experiencing some falls at home. On this date, patient demonstrates some general LE weakness, mildly impaired sensation in LE, poor posture, and impaired balance, which all may be contributing to her recurrent falls at home. During MMT, pt demonstrates some mild-mod jerky movements at times. Reports she had been experiencing this for some time now. Patient will benefit from skilled physical therapy in order to address  the above to improve her function, decrease her fall risk, and improve her QOL.    OBJECTIVE IMPAIRMENTS: Abnormal gait, decreased balance, decreased coordination, decreased endurance, difficulty walking, decreased strength, decreased safety awareness, impaired sensation, and pain.   ACTIVITY LIMITATIONS: carrying, lifting, bending, standing, squatting, stairs, and transfers  PARTICIPATION LIMITATIONS: cleaning, shopping, community activity, and yard work  [facility] POTENTIAL: Good  CLINICAL DECISION MAKING: Stable/uncomplicated  EVALUATION COMPLEXITY: Low   GOALS: Goals reviewed with patient? No  SHORT TERM GOALS: Target date: 12/26/23  Patient will be independent with performance of HEP to demonstrate adequate self management of symptoms.  Baseline:  Goal status: INITIAL  2.   Patient will report at least a 25% improvement with function or pain overall since beginning PT. Baseline:  Goal status: INITIAL   LONG TERM GOALS: Target date: 01/23/24  Patient will improve ABC Scale score by 10% to demonstrate improved perceived function of balance.  Baseline: Goal status: INITIAL 2.  Patient will score at least a  4+ on  all LE MMT to show increased LE strength and/or power and improve ambulation/gait mechanics.  Baseline:  Goal status: INITIAL 3. Patient will be able to maintain SLS on each leg for 30" in order to demonstrate improved balance for completing functional tasks such as stairs.   Baseline: Goal status: INITIAL  PLAN:  PT FREQUENCY: 1-2x/week  PT DURATION: 6 weeks  PLANNED INTERVENTIONS: 97164- PT Re-evaluation, 97110-Therapeutic exercises, 97530- Therapeutic activity, [id number]- Neuromuscular re-education, 97535- Self Care, 16109- Manual therapy, [phone]- Gait training, Patient/Family education, Balance training, and Stair training.  PLAN FOR NEXT SESSION: test, TUG, hip ext and abd test MMT, Berg or DGI, progress LE strengthening, Core strengthening, balance activities    5:53 PM, 12/12/23 Marysue Sola, PT, DPT [facility] Medicare Auth Request Information  Date of referral: 11/09/23 Referring provider: Omie Bickers, MD Referring diagnosis (ICD 10)? G62.9 (ICD-10-CM) - Polyneuropathy, unspecified Treatment diagnosis (ICD 10)? (if different than referring diagnosis)  Z74.09 M62.81  Functional Tool Score:  ABC scale Total ABC score: 1110 / 1600 = 69.4 %  What was this (referring dx) caused by? Hercules Lombard of Condition: Chronic (continuous duration > 3 months)   Laterality: Both  Current Functional Measure Score:  Other ABC scale Total ABC score: 1110 / 1600 = 69.4 % 30 seconds chair stand test 7 STS Tandem Stance: RLE leading: 2"        LLE leading: 10" SLS: 15" bilat.  Objective measurements identify impairments when they are compared to normal values, the uninvolved extremity, and prior level of function.  [x]  Yes  []  No  Objective assessment of functional ability: Moderate functional limitations   Briefly describe symptoms: See above  How did symptoms start: Insidious  Average pain intensity:  Last 24 hours: N/A, pt with intermittent pain but recurrent falls  Past week: N/A pt with intermittent pain but recurrent falls  How often does the pt experience symptoms? Frequently  How much have the symptoms interfered with usual daily activities? Quite a bit  How has condition changed since care  began at this facility? NA - initial visit  In general, how is the patients overall health? Fair   BACK PAIN (STarT Back Screening Tool) No

## 2024-01-03 ENCOUNTER — Encounter (HOSPITAL_COMMUNITY)

## 2024-01-04 ENCOUNTER — Telehealth (HOSPITAL_COMMUNITY): Payer: Self-pay

## 2024-01-04 NOTE — Telephone Encounter (Signed)
 Called patient concerning missed appointment yesterday, 01/03/24. Spoke with patient who requested to cancel remaining appointments. Patient reports not having the monetary funds to attend PT at this time and wasn't sure why MD referred her to PT. Patient reminded of recent falls and impaired balance being referral reason but patient doesn't believe her balance is impaired enough to warrant PT services at this time. Remaining appointments cancelled.   3:04 PM, 01/04/24 Marysue Sola, PT, DPT [facility] [place]

## 2024-01-09 ENCOUNTER — Encounter (HOSPITAL_COMMUNITY)

## 2024-01-16 ENCOUNTER — Encounter (HOSPITAL_COMMUNITY)

## 2024-01-21 ENCOUNTER — Encounter (HOSPITAL_COMMUNITY): Admitting: Physical Therapy

## 2024-01-28 ENCOUNTER — Encounter (HOSPITAL_COMMUNITY)

## 2024-02-01 ENCOUNTER — Emergency Department (HOSPITAL_COMMUNITY)

## 2024-02-01 ENCOUNTER — Other Ambulatory Visit: Payer: Self-pay

## 2024-02-01 ENCOUNTER — Encounter (HOSPITAL_COMMUNITY): Payer: Self-pay | Admitting: Emergency Medicine

## 2024-02-01 ENCOUNTER — Emergency Department (HOSPITAL_COMMUNITY)
Admission: EM | Admit: 2024-02-01 | Discharge: 2024-02-01 | Disposition: A | Discharge status: Home / Self Care | Attending: Student | Admitting: Student

## 2024-02-01 DIAGNOSIS — J449 Chronic obstructive pulmonary disease, unspecified: Secondary | ICD-10-CM | POA: Diagnosis not present

## 2024-02-01 DIAGNOSIS — W010XXA Fall on same level from slipping, tripping and stumbling without subsequent striking against object, initial encounter: Secondary | ICD-10-CM | POA: Diagnosis not present

## 2024-02-01 DIAGNOSIS — Z7951 Long term (current) use of inhaled steroids: Secondary | ICD-10-CM | POA: Diagnosis not present

## 2024-02-01 DIAGNOSIS — Z853 Personal history of malignant neoplasm of breast: Secondary | ICD-10-CM | POA: Insufficient documentation

## 2024-02-01 DIAGNOSIS — Z79899 Other long term (current) drug therapy: Secondary | ICD-10-CM | POA: Insufficient documentation

## 2024-02-01 DIAGNOSIS — Z85118 Personal history of other malignant neoplasm of bronchus and lung: Secondary | ICD-10-CM | POA: Diagnosis not present

## 2024-02-01 DIAGNOSIS — E119 Type 2 diabetes mellitus without complications: Secondary | ICD-10-CM | POA: Insufficient documentation

## 2024-02-01 DIAGNOSIS — I10 Essential (primary) hypertension: Secondary | ICD-10-CM | POA: Insufficient documentation

## 2024-02-01 DIAGNOSIS — F17210 Nicotine dependence, cigarettes, uncomplicated: Secondary | ICD-10-CM | POA: Insufficient documentation

## 2024-02-01 DIAGNOSIS — S6992XA Unspecified injury of left wrist, hand and finger(s), initial encounter: Secondary | ICD-10-CM | POA: Diagnosis present

## 2024-02-01 DIAGNOSIS — W19XXXA Unspecified fall, initial encounter: Secondary | ICD-10-CM

## 2024-02-01 DIAGNOSIS — S63502A Unspecified sprain of left wrist, initial encounter: Secondary | ICD-10-CM | POA: Diagnosis not present

## 2024-02-01 DIAGNOSIS — Z7984 Long term (current) use of oral hypoglycemic drugs: Secondary | ICD-10-CM | POA: Insufficient documentation

## 2024-02-01 DIAGNOSIS — Z7982 Long term (current) use of aspirin: Secondary | ICD-10-CM | POA: Insufficient documentation

## 2024-02-01 MED ORDER — HYDROCODONE-ACETAMINOPHEN 5-325 MG PO TABS: 1.0000 | ORAL_TABLET | Freq: Four times a day (QID) | ORAL | 0 refills | Status: AC | PRN

## 2024-02-01 MED ORDER — HYDROCODONE-ACETAMINOPHEN 5-325 MG PO TABS
1.0000 | ORAL_TABLET | Freq: Once | ORAL | Status: AC
  Administered 2024-02-01: 1 via ORAL
  Filled 2024-02-01: qty 1

## 2024-02-01 MED ORDER — NAPROXEN 375 MG PO TABS: 375.0000 mg | ORAL_TABLET | Freq: Two times a day (BID) | ORAL | 0 refills | Status: AC

## 2024-02-01 NOTE — ED Triage Notes (Signed)
 Pt c/o pain to left arm from forearm to hand after a fall last night

## 2024-02-01 NOTE — ED Provider Notes (Signed)
 [place] [facility]ERGENCY DE[facility]MENT AT [facility] Provider Note  CSN: [id number] Arrival date & time: 02/01/24 0730  Chief Complaint(s) Arm Injury  HPI HOLIDAY MCMENAMIN is a 73 y.o. female with PMH bipolar 1, breast cancer, lung cancer, T2DM, osteoarthritis who presents emerged part for evaluation of arm and hand pain after a fall.  Patient states that 1 AM this morning she slipped in the kitchen and landed on outstretched wrist and elbow.  Pain worse in the mid forearm on the left that extends into the hand and fingers.  Pulses intact on arrival.  Denies numbness, tingling, weakness or other neurologic complaints.   Past Medical History Past Medical History:  Diagnosis Date   Allergic rhinitis    Anxiety    Arthritis    Bipolar 1 disorder (HCC)    Breast cancer (HCC) 2004   Carpal tunnel syndrome of left wrist    Chronic neck pain    Chronic shoulder pain    L>R   Depression    GERD (gastroesophageal reflux disease)    Headache(784.0)    History of lung surgery    Hyperlipidemia    Hypertension    Lung cancer (HCC)    Lung nodule 2009   Osteoarthritis of right shoulder    Pain in both feet    Personal history of radiation therapy 2004   PONV (postoperative nausea and vomiting)    Type 2 diabetes mellitus (HCC)    Patient Active Problem List   Diagnosis Date Noted   OA (osteoarthritis) of shoulder 04/15/2019   Shoulder arthritis 08/27/2018   Primary osteoarthritis, left shoulder 04/01/2018   Primary osteoarthritis, right shoulder 04/01/2018   Essential hypertension 01/22/2017   Controlled type 2 diabetes mellitus without complication, without long-term current use of insulin (HCC) 11/23/2016   Chronic pain of both shoulders 07/24/2016   Prediabetes    Anxiety    Bipolar 1 disorder (HCC)    Depression    Chronic back pain 11/07/2012   Insomnia secondary to depression with anxiety 09/23/2012   Vitamin D  deficiency 09/23/2012   COPD, mild (HCC) 07/01/2012    Acute bronchitis 06/08/2012   Adenocarcinoma of lung, stage 1 (HCC) 05/23/2012   RECTAL BLEEDING 04/27/2010   Hemorrhoids 03/11/2009   Malignant neoplasm of female breast (HCC) 12/15/2008   FIBROIDS, UTERUS 12/15/2008   HYPERCHOLESTEROLEMIA 12/15/2008   Hyperlipidemia 12/15/2008   Anxiety and depression 12/15/2008   GASTROESOPHAGEAL REFLUX DISEASE 12/15/2008   Gastritis and gastroduodenitis 12/15/2008   Constipation 12/15/2008   FLATULENCE ERUCTATION AND GAS PAIN 12/15/2008   Abdominal pain 12/15/2008   RECTAL BLEEDING, HX OF 12/15/2008   Home Medication(s) Prior to Admission medications   Medication Sig Start Date End Date Taking? Authorizing Provider  HYDROcodone -acetaminophen  (NORCO/VICODIN) 5-325 MG tablet Take 1 tablet by mouth every 6 (six) hours as needed for severe pain (pain score 7-10). 02/01/24  Yes Sissy Goetzke, MD  naproxen  (NAPROSYN ) 375 MG tablet Take 1 tablet (375 mg total) by mouth 2 (two) times daily. 02/01/24  Yes Smita Lesh, MD  albuterol  (PROVENTIL  HFA;VENTOLIN  HFA) 108 (90 Base) MCG/ACT inhaler Inhale 2 puffs into the lungs every 4 (four) hours as needed for wheezing or shortness of breath. 06/19/18   Tapia, Leisa, PA-C  aspirin  81 MG tablet Take 1 tablet (81 mg total) by mouth daily. 01/22/17   Loralie Rocher B, PA-C  blood glucose meter kit and supplies KIT Check up to twice a day before meals 04/18/17   Debbe Fail, PA-C  buPROPion  (WELLBUTRIN  SR) 150 MG 12 hr tablet Take 1 tablet (150 mg total) by mouth 2 (two) times daily. 11/27/18   Tapia, Leisa, PA-C  cetirizine  (ZYRTEC ) 10 MG tablet Take 1 tablet (10 mg total) by mouth daily. 11/27/18   Tapia, Leisa, PA-C  EPINEPHrine  0.3 mg/0.3 mL IJ SOAJ injection Inject 0.3 mLs (0.3 mg total) into the muscle as needed for anaphylaxis. 01/21/19   Tapia, Leisa, PA-C  FLUoxetine  (PROZAC ) 40 MG capsule Take 1 capsule (40 mg total) by mouth daily. 10/18/18   Tapia, Leisa, PA-C  fluticasone  (FLONASE ) 50 MCG/ACT nasal spray Place 2  sprays into both nostrils daily. Patient taking differently: Place 2 sprays into both nostrils daily as needed for allergies. 10/11/16   Domenic Fridge, MD  gabapentin  (NEURONTIN ) 800 MG tablet Take 1 tablet (800 mg total) by mouth 2 (two) times daily. 11/27/18   Adeline Hone, PA-C  glucose blood test strip Check up to twice a day 01/23/17   Austine Lefort, MD  HYDROcodone -acetaminophen  (NORCO/VICODIN) 5-325 MG tablet Take 1 tablet by mouth 2 (two) times daily as needed. 01/12/23   [provider]  Lancets [facility] ULTRASOFT) lancets Use as instructed 10/31/16   Domenic Fridge, MD  losartan  (COZAAR ) 50 MG tablet Take 50 mg by mouth daily. 11/27/22   [provider]  metFORMIN  (GLUCOPHAGE ) 500 MG tablet Take 1 tablet (500 mg total) by mouth 2 (two) times daily with a meal. 10/18/18   Adeline Hone, PA-C  MYRBETRIQ 25 MG TB24 tablet Take 25 mg by mouth daily. 01/20/23   [provider]  NONFORMULARY OR COMPOUNDED ITEM Apply 1 application topically 2 (two) times daily as needed (foot pain). Ibuprofen  15%, Baclofen 2%-3%, Lidocaine  Gaba Cream Base    [provider]  oxybutynin (DITROPAN) 5 MG tablet Take 5 mg by mouth 2 (two) times daily. 11/09/22   [provider]  oxyCODONE -acetaminophen  (PERCOCET/ROXICET) 5-325 MG tablet Take 1 tablet by mouth every 6 (six) hours as needed for severe pain. Patient not taking: Reported on 01/28/2023 08/18/21   Loeffler, Egbert Grass, PA-C  pantoprazole  (PROTONIX ) 40 MG tablet Take 1 tablet (40 mg total) by mouth daily. 11/27/18   Tapia, Leisa, PA-C  rosuvastatin  (CRESTOR ) 40 MG tablet Take 1 tablet (40 mg total) by mouth daily. 10/08/18   Austine Lefort, MD                                                                                                                                    Past Surgical History Past Surgical History:  Procedure Laterality Date   BREAST SURGERY  2005   lumpectomy - left   carpel tunnel Bilateral     CESAREAN SECTION  1971   COLONOSCOPY WITH PROPOFOL  N/A 07/25/2016   Procedure: COLONOSCOPY WITH PROPOFOL ;  Surgeon: Alyce Jubilee, MD;  Location: [facility];  Service: Endoscopy;  Laterality: N/A;  8:15 AM  EYE SURGERY     LUNG CANCER SURGERY Right 05/23/2012   reportedly clear   TONSILLECTOMY     TOTAL SHOULDER ARTHROPLASTY Left 08/27/2018   Procedure: LEFT TOTAL SHOULDER ARTHROPLASTY;  Surgeon: Jasmine Mesi, MD;  Location: [facility] OR;  Service: Orthopedics;  Laterality: Left;   TOTAL SHOULDER ARTHROPLASTY Right 04/15/2019   Procedure: RIGHT TOTAL SHOULDER ARTHROPLASTY;  Surgeon: Jasmine Mesi, MD;  Location: [facility] OR;  Service: Orthopedics;  Laterality: Right;   TUBAL LIGATION     Family History Family History  Problem Relation Age of Onset   Heart disease Mother        CHF   Physical abuse Mother    Depression Mother    Cancer Sister    ADD / ADHD Sister    Alcohol abuse Father    Alcohol abuse Brother    Mental illness Brother    Cancer Sister    Heart disease Sister    Physical abuse Sister    OCD Sister    Mental illness Sister    Anxiety disorder Neg Hx    Bipolar disorder Neg Hx    Dementia Neg Hx    Drug abuse Neg Hx    Paranoid behavior Neg Hx    Schizophrenia Neg Hx    Seizures Neg Hx    Sexual abuse Neg Hx     Social History Social History   Tobacco Use   Smoking status: Every Day    Current packs/day: 0.50    Types: Cigarettes   Smokeless tobacco: Never  Vaping Use   Vaping status: Never Used  Substance Use Topics   Alcohol use: Never    Comment: occasionally    Drug use: Never   Allergies Bee venom and Penicillins  Review of Systems Review of Systems  Musculoskeletal:  Positive for arthralgias and myalgias.    Physical Exam Vital Signs  I have reviewed the triage vital signs BP (!) 157/93   Pulse 86   Temp 97.6 F (36.4 C) (Oral)   Resp 17   Ht 5' 3 (1.6 m)   Wt 63.5 kg   SpO2 97%   BMI 24.80 kg/m   Physical  Exam Vitals and nursing note reviewed.  Constitutional:      General: She is not in acute distress.    Appearance: She is well-developed.  HENT:     Head: Normocephalic and atraumatic.   Eyes:     Conjunctiva/sclera: Conjunctivae normal.    Cardiovascular:     Rate and Rhythm: Normal rate and regular rhythm.     Heart sounds: No murmur heard. Pulmonary:     Effort: Pulmonary effort is normal. No respiratory distress.     Breath sounds: Normal breath sounds.  Abdominal:     Palpations: Abdomen is soft.     Tenderness: There is no abdominal tenderness.   Musculoskeletal:        General: Swelling and tenderness present.     Cervical back: Neck supple.   Skin:    General: Skin is warm and dry.     Capillary Refill: Capillary refill takes less than 2 seconds.   Neurological:     Mental Status: She is alert.   Psychiatric:        Mood and Affect: Mood normal.     ED Results and Treatments Labs (all labs ordered are listed, but only abnormal results are displayed) Labs Reviewed - No data to display  Radiology DG Hand Complete Left Result Date: 02/01/2024 CLINICAL DATA:  fall r/o fx, left hand pain EXAM: LEFT HAND - COMPLETE 3+ VIEW COMPARISON:  None Available. FINDINGS: Osteopenia.No acute fracture or dislocation. Mild-to-moderate joint space loss of multiple distal interphalangeal joints, most notably in the thumb. Joint space loss also noted at the first carpometacarpal joint. Soft tissues are unremarkable. IMPRESSION: 1. No acute fracture or dislocation. 2. Osteoarthritis within the fingers and at the base of the thumb. Electronically Signed   By: Rance Burrows M.D.   On: 02/01/2024 08:37   DG Forearm Left Result Date: 02/01/2024 CLINICAL DATA:  fall r/o fx, left arm pain EXAM: LEFT FOREARM - 2 VIEW COMPARISON:  None Available. FINDINGS: No acute fracture  or dislocation. There is no evidence of arthropathy or other focal bone abnormality. Soft tissues are unremarkable. IMPRESSION: No acute fracture or dislocation. Electronically Signed   By: Rance Burrows M.D.   On: 02/01/2024 08:34    Pertinent labs & imaging results that were available during my care of the patient were reviewed by me and considered in my medical decision making (see MDM for details).  Medications Ordered in ED Medications  HYDROcodone -acetaminophen  (NORCO/VICODIN) 5-325 MG per tablet 1 tablet (1 tablet Oral Given 02/01/24 0757)                                                                                                                                     Procedures Procedures  (including critical care time)  Medical Decision Making / ED Course   This patient presents to the ED for concern of fall, wrist and arm pain, this involves an extensive number of treatment options, and is a complaint that carries with it a high risk of complications and morbidity.  The differential diagnosis includes fracture, ligamentous injury, sprain, dislocation, contusion, hematoma  MDM: Patient seen emergency room for evaluation of a fall.  Physical exam with tenderness along the forearm on the left, wrist and hand.  Compartments are soft and pulses are intact.  Neuroexam unremarkable patient does have full range of motion but limited by pain.  Extremity and reassuringly negative for fracture.  Patient be placed into wrist brace for comfort and she already follows outpatient with an orthopedic physician in [place] who she will contact for follow-up.  At this time she does not meet inpatient criteria for admission will be discharged with outpatient follow-up   Additional history obtained: -Additional history obtained from husband -External records from outside source obtained and reviewed including: Chart review including previous notes, labs, imaging, consultation notes  Imaging  Studies ordered: I ordered imaging studies including x-ray wrist and forearm I independently visualized and interpreted imaging. I agree with the radiologist interpretation   Medicines ordered and prescription drug management: Meds ordered this encounter  Medications   HYDROcodone -acetaminophen  (NORCO/VICODIN) 5-325 MG per tablet 1 tablet    Refill:  0   HYDROcodone -acetaminophen  (NORCO/VICODIN)  5-325 MG tablet    Sig: Take 1 tablet by mouth every 6 (six) hours as needed for severe pain (pain score 7-10).    Dispense:  10 tablet    Refill:  0   naproxen  (NAPROSYN ) 375 MG tablet    Sig: Take 1 tablet (375 mg total) by mouth 2 (two) times daily.    Dispense:  10 tablet    Refill:  0    -I have reviewed the patients home medicines and have made adjustments as needed  Critical interventions none    Social Determinants of Health:  Factors impacting patients care include: none   Reevaluation: After the interventions noted above, I reevaluated the patient and found that they have :improved  Co morbidities that complicate the patient evaluation  Past Medical History:  Diagnosis Date   Allergic rhinitis    Anxiety    Arthritis    Bipolar 1 disorder (HCC)    Breast cancer (HCC) 2004   Carpal tunnel syndrome of left wrist    Chronic neck pain    Chronic shoulder pain    L>R   Depression    GERD (gastroesophageal reflux disease)    Headache(784.0)    History of lung surgery    Hyperlipidemia    Hypertension    Lung cancer (HCC)    Lung nodule 2009   Osteoarthritis of right shoulder    Pain in both feet    Personal history of radiation therapy 2004   PONV (postoperative nausea and vomiting)    Type 2 diabetes mellitus (HCC)       Dispostion: I considered admission for this patient, but at this time she does not meet inpatient criteria for admission will be discharged outpatient follow-up     Final Clinical Impression(s) / ED Diagnoses Final diagnoses:  Fall,  initial encounter  Sprain of left wrist, initial encounter     @PCDICTATION @    Alyss Granato, Alyse July, MD 02/01/24 0900

## 2024-02-25 ENCOUNTER — Other Ambulatory Visit (HOSPITAL_COMMUNITY): Payer: Self-pay | Admitting: Nurse Practitioner

## 2024-02-25 DIAGNOSIS — Z122 Encounter for screening for malignant neoplasm of respiratory organs: Secondary | ICD-10-CM

## 2024-02-25 DIAGNOSIS — Z13820 Encounter for screening for osteoporosis: Secondary | ICD-10-CM

## 2024-02-25 DIAGNOSIS — Z1231 Encounter for screening mammogram for malignant neoplasm of breast: Secondary | ICD-10-CM

## 2024-04-28 ENCOUNTER — Other Ambulatory Visit (HOSPITAL_COMMUNITY)

## 2024-04-28 ENCOUNTER — Ambulatory Visit (HOSPITAL_COMMUNITY)

## 2024-05-01 ENCOUNTER — Ambulatory Visit (HOSPITAL_COMMUNITY)
Admission: RE | Admit: 2024-05-01 | Discharge: 2024-05-01 | Disposition: A | Discharge status: Home / Self Care | Source: Ambulatory Visit | Attending: Nurse Practitioner | Admitting: Nurse Practitioner

## 2024-05-01 DIAGNOSIS — Z13820 Encounter for screening for osteoporosis: Secondary | ICD-10-CM | POA: Insufficient documentation

## 2024-05-01 DIAGNOSIS — Z1231 Encounter for screening mammogram for malignant neoplasm of breast: Secondary | ICD-10-CM | POA: Diagnosis present

## 2024-05-01 DIAGNOSIS — M8589 Other specified disorders of bone density and structure, multiple sites: Secondary | ICD-10-CM | POA: Diagnosis not present

## 2024-05-26 ENCOUNTER — Other Ambulatory Visit (HOSPITAL_COMMUNITY): Payer: Self-pay | Admitting: Nephrology

## 2024-05-26 DIAGNOSIS — D638 Anemia in other chronic diseases classified elsewhere: Secondary | ICD-10-CM

## 2024-05-26 DIAGNOSIS — N1832 Chronic kidney disease, stage 3b: Secondary | ICD-10-CM

## 2024-05-29 ENCOUNTER — Other Ambulatory Visit (HOSPITAL_COMMUNITY): Payer: Self-pay | Admitting: Nurse Practitioner

## 2024-05-29 DIAGNOSIS — Z122 Encounter for screening for malignant neoplasm of respiratory organs: Secondary | ICD-10-CM

## 2024-06-02 ENCOUNTER — Encounter (HOSPITAL_COMMUNITY): Payer: Self-pay

## 2024-06-02 ENCOUNTER — Ambulatory Visit (HOSPITAL_COMMUNITY)

## 2024-06-09 ENCOUNTER — Ambulatory Visit (HOSPITAL_COMMUNITY): Attending: Nephrology

## 2024-07-07 ENCOUNTER — Other Ambulatory Visit: Payer: Self-pay

## 2024-07-07 ENCOUNTER — Emergency Department (HOSPITAL_COMMUNITY)

## 2024-07-07 ENCOUNTER — Emergency Department (HOSPITAL_COMMUNITY)
Admission: EM | Admit: 2024-07-07 | Discharge: 2024-07-07 | Disposition: A | Discharge status: Home / Self Care | Attending: Emergency Medicine | Admitting: Emergency Medicine

## 2024-07-07 DIAGNOSIS — R519 Headache, unspecified: Secondary | ICD-10-CM | POA: Insufficient documentation

## 2024-07-07 DIAGNOSIS — Z7984 Long term (current) use of oral hypoglycemic drugs: Secondary | ICD-10-CM | POA: Insufficient documentation

## 2024-07-07 DIAGNOSIS — Y9301 Activity, walking, marching and hiking: Secondary | ICD-10-CM | POA: Insufficient documentation

## 2024-07-07 DIAGNOSIS — Z79899 Other long term (current) drug therapy: Secondary | ICD-10-CM | POA: Insufficient documentation

## 2024-07-07 DIAGNOSIS — Z7982 Long term (current) use of aspirin: Secondary | ICD-10-CM | POA: Diagnosis not present

## 2024-07-07 DIAGNOSIS — W19XXXA Unspecified fall, initial encounter: Secondary | ICD-10-CM

## 2024-07-07 DIAGNOSIS — R269 Unspecified abnormalities of gait and mobility: Secondary | ICD-10-CM

## 2024-07-07 DIAGNOSIS — I10 Essential (primary) hypertension: Secondary | ICD-10-CM | POA: Insufficient documentation

## 2024-07-07 DIAGNOSIS — R2689 Other abnormalities of gait and mobility: Secondary | ICD-10-CM | POA: Diagnosis not present

## 2024-07-07 DIAGNOSIS — S63502A Unspecified sprain of left wrist, initial encounter: Secondary | ICD-10-CM | POA: Insufficient documentation

## 2024-07-07 DIAGNOSIS — W01198A Fall on same level from slipping, tripping and stumbling with subsequent striking against other object, initial encounter: Secondary | ICD-10-CM | POA: Diagnosis not present

## 2024-07-07 DIAGNOSIS — E119 Type 2 diabetes mellitus without complications: Secondary | ICD-10-CM | POA: Insufficient documentation

## 2024-07-07 DIAGNOSIS — S6992XA Unspecified injury of left wrist, hand and finger(s), initial encounter: Secondary | ICD-10-CM | POA: Diagnosis present

## 2024-07-07 LAB — CBC WITH DIFFERENTIAL/PLATELET
Abs Immature Granulocytes: 0.01 K/uL (ref 0.00–0.07)
Basophils Absolute: 0 K/uL (ref 0.0–0.1)
Basophils Relative: 0 %
Eosinophils Absolute: 0.1 K/uL (ref 0.0–0.5)
Eosinophils Relative: 1 %
HCT: 37.6 % (ref 36.0–46.0)
Hemoglobin: 11.7 g/dL — ABNORMAL LOW (ref 12.0–15.0)
Immature Granulocytes: 0 %
Lymphocytes Relative: 23 %
Lymphs Abs: 1.3 K/uL (ref 0.7–4.0)
MCH: 29.8 pg (ref 26.0–34.0)
MCHC: 31.1 g/dL (ref 30.0–36.0)
MCV: 95.9 fL (ref 80.0–100.0)
Monocytes Absolute: 0.6 K/uL (ref 0.1–1.0)
Monocytes Relative: 10 %
Neutro Abs: 3.8 K/uL (ref 1.7–7.7)
Neutrophils Relative %: 66 %
Platelets: 221 K/uL (ref 150–400)
RBC: 3.92 MIL/uL (ref 3.87–5.11)
RDW: 13.1 % (ref 11.5–15.5)
WBC: 5.8 K/uL (ref 4.0–10.5)
nRBC: 0 % (ref 0.0–0.2)

## 2024-07-07 LAB — BASIC METABOLIC PANEL WITH GFR
Anion gap: 9 (ref 5–15)
BUN: 18 mg/dL (ref 8–23)
CO2: 24 mmol/L (ref 22–32)
Calcium: 9.5 mg/dL (ref 8.9–10.3)
Chloride: 107 mmol/L (ref 98–111)
Creatinine, Ser: 1.42 mg/dL — ABNORMAL HIGH (ref 0.44–1.00)
GFR, Estimated: 39 mL/min — ABNORMAL LOW (ref >60)
Glucose, Bld: 91 mg/dL (ref 70–99)
Potassium: 4.2 mmol/L (ref 3.5–5.1)
Sodium: 141 mmol/L (ref 135–145)

## 2024-07-07 LAB — MAGNESIUM: Magnesium: 2 mg/dL (ref 1.7–2.4)

## 2024-07-07 NOTE — ED Provider Notes (Signed)
 Deborah Mullins Provider Note   CSN: [id number] Arrival date & time: 07/07/24  9258     Patient presents with: Deborah Mullins   ERLA Deborah Mullins is a 73 y.o. female.   HPI     73 year old female comes in with chief complaint of fall, wrist pain. Patient has history of hypertension, hyperlipidemia, diabetes.  Deborah Mullins does not have any history of heart attack or stroke.  Deborah Mullins denies any history of alcohol use disorder.  Over the last 2 months, patient has had frequent falls.  Deborah Mullins feels that Deborah Mullins would fall daily, due to balance issues, that come out intermittently while Deborah Mullins is walking.  Patient has no dizziness or spinning sensation, just balance issues leading to falls.  Patient takes pain medication, but it is not new.  Deborah Mullins has struck her head on few of those falls.  Deborah Mullins denies any severe headache.  Patient has seen her PCP about this matter, but patient alleges that Deborah Mullins was asked just to be careful.  Patient denies any difficulty in swallowing, with speech or with vision.  Prior to Admission medications   Medication Sig Start Date End Date Taking? Authorizing Provider  albuterol  (PROVENTIL  HFA;VENTOLIN  HFA) 108 (90 Base) MCG/ACT inhaler Inhale 2 puffs into the lungs every 4 (four) hours as needed for wheezing or shortness of breath. 06/19/18   Tapia, Leisa, PA-C  aspirin  81 MG tablet Take 1 tablet (81 mg total) by mouth daily. 01/22/17   Melvenia Shuck B, PA-C  blood glucose meter kit and supplies KIT Check up to twice a day before meals 04/18/17   Dixon, Shuck NOVAK, PA-C  buPROPion  (WELLBUTRIN  SR) 150 MG 12 hr tablet Take 1 tablet (150 mg total) by mouth 2 (two) times daily. 11/27/18   Tapia, Leisa, PA-C  cetirizine  (ZYRTEC ) 10 MG tablet Take 1 tablet (10 mg total) by mouth daily. 11/27/18   Tapia, Leisa, PA-C  EPINEPHrine  0.3 mg/0.3 mL IJ SOAJ injection Inject 0.3 mLs (0.3 mg total) into the muscle as needed for anaphylaxis. 01/21/19   Tapia, Leisa, PA-C  FLUoxetine   (PROZAC ) 40 MG capsule Take 1 capsule (40 mg total) by mouth daily. 10/18/18   Tapia, Leisa, PA-C  fluticasone  (FLONASE ) 50 MCG/ACT nasal spray Place 2 sprays into both nostrils daily. Patient taking differently: Place 2 sprays into both nostrils daily as needed for allergies. 10/11/16   Jerrell Niels CROME, MD  gabapentin  (NEURONTIN ) 800 MG tablet Take 1 tablet (800 mg total) by mouth 2 (two) times daily. 11/27/18   Leavy Mole, PA-C  glucose blood test strip Check up to twice a day 01/23/17   Duanne Butler DASEN, MD  HYDROcodone -acetaminophen  (NORCO/VICODIN) 5-325 MG tablet Take 1 tablet by mouth 2 (two) times daily as needed. 01/12/23   [provider]  HYDROcodone -acetaminophen  (NORCO/VICODIN) 5-325 MG tablet Take 1 tablet by mouth every 6 (six) hours as needed for severe pain (pain score 7-10). 02/01/24   Kommor, Madison, MD  Lancets [facility] ULTRASOFT) lancets Use as instructed 10/31/16   Jerrell Niels CROME, MD  losartan  (COZAAR ) 50 MG tablet Take 50 mg by mouth daily. 11/27/22   [provider]  metFORMIN  (GLUCOPHAGE ) 500 MG tablet Take 1 tablet (500 mg total) by mouth 2 (two) times daily with a meal. 10/18/18   Leavy Mole, PA-C  MYRBETRIQ 25 MG TB24 tablet Take 25 mg by mouth daily. 01/20/23   [provider]  naproxen  (NAPROSYN ) 375 MG tablet Take 1 tablet (375 mg total) by  mouth 2 (two) times daily. 02/01/24   Kommor, Madison, MD  NONFORMULARY OR COMPOUNDED ITEM Apply 1 application topically 2 (two) times daily as needed (foot pain). Ibuprofen  15%, Baclofen 2%-3%, Lidocaine  Gaba Cream Base    [provider]  oxybutynin (DITROPAN) 5 MG tablet Take 5 mg by mouth 2 (two) times daily. 11/09/22   [provider]  oxyCODONE -acetaminophen  (PERCOCET/ROXICET) 5-325 MG tablet Take 1 tablet by mouth every 6 (six) hours as needed for severe pain. Patient not taking: Reported on 01/28/2023 08/18/21   Loeffler, Ronnald BROCKS, PA-C  pantoprazole  (PROTONIX ) 40 MG tablet Take 1 tablet (40  mg total) by mouth daily. 11/27/18   Tapia, Leisa, PA-C  rosuvastatin  (CRESTOR ) 40 MG tablet Take 1 tablet (40 mg total) by mouth daily. 10/08/18   Duanne Butler DASEN, MD    Allergies: Bee venom and Penicillins    Review of Systems  All other systems reviewed and are negative.   Updated Vital Signs BP (!) 154/105   Pulse 91   Temp 98.7 F (37.1 C) (Oral)   Resp 16   Ht 5' 3 (1.6 m)   Wt 59 kg   SpO2 97%   BMI 23.03 kg/m   Physical Exam Vitals and nursing note reviewed.  Constitutional:      Appearance: Deborah Mullins is well-developed.  HENT:     Head: Atraumatic.  Eyes:     Extraocular Movements: Extraocular movements intact.     Pupils: Pupils are equal, round, and reactive to light.     Comments: No nystagmus  Cardiovascular:     Rate and Rhythm: Normal rate.  Pulmonary:     Effort: Pulmonary effort is normal.  Musculoskeletal:     Cervical back: Normal range of motion and neck supple.  Skin:    General: Skin is warm and dry.  Neurological:     Mental Status: Deborah Mullins is alert and oriented to person, place, and time.     Cranial Nerves: No cranial nerve deficit.     Sensory: No sensory deficit.     Motor: No weakness.     Coordination: Coordination normal.     Comments: Patient was able to ambulate without requiring support, but states that Deborah Mullins still felt off balance     (all labs ordered are listed, but only abnormal results are displayed) Labs Reviewed  BASIC METABOLIC PANEL WITH GFR - Abnormal; Notable for the following components:      Result Value   Creatinine, Ser 1.42 (*)    GFR, Estimated 39 (*)    All other components within normal limits  CBC WITH DIFFERENTIAL/PLATELET - Abnormal; Notable for the following components:   Hemoglobin 11.7 (*)    All other components within normal limits  MAGNESIUM     EKG: None  Radiology: CT Head Wo Contrast Result Date: 07/07/2024 EXAM: CT HEAD WITHOUT CONTRAST 07/07/2024 09:01:24 AM TECHNIQUE: CT of the head was  performed without the administration of intravenous contrast. Automated exposure control, iterative reconstruction, and/or weight based adjustment of the mA/kV was utilized to reduce the radiation dose to as low as reasonably achievable. COMPARISON: Head CT 01/28/2023. CLINICAL HISTORY: 73 year old female with minor head trauma, new intermittent ataxia, and multiple falls. FINDINGS: BRAIN AND VENTRICLES: No acute hemorrhage. No evidence of acute infarct. No hydrocephalus. No extra-axial collection. No mass effect or midline shift. Brain volume is normal for age. Mild for age patchy periventricular white matter hypodensity appears stable. No suspicious intracranial vascular hyperdensity. Calcified atherosclerosis at the skull base.  ORBITS: No acute abnormality. SINUSES: Visible paranasal sinuses, middle ears and mastoids remain well aerated. SOFT TISSUES AND SKULL: No acute soft tissue abnormality. No skull fracture. IMPRESSION: 1. No acute intracranial abnormality or acute traumatic injury identified. 2. Mild for age chronic white matter disease most commonly due to small vessel ischemia. Electronically signed by: Helayne Hurst MD 07/07/2024 09:08 AM EST RP Workstation: [id number]   DG Wrist Complete Left Result Date: 07/07/2024 CLINICAL DATA:  Fall and trauma to the left wrist. EXAM: LEFT WRIST - COMPLETE 3+ VIEW COMPARISON:  Left upper extremity radiograph dated 02/01/2024. FINDINGS: There is no acute fracture or dislocation. The bones are osteopenic. There is mild soft tissue swelling over the radial wrist. No radiopaque foreign object or soft tissue gas. IMPRESSION: 1. No acute fracture or dislocation. 2. Mild soft tissue swelling over the radial wrist. Electronically Signed   By: Vanetta Chou M.D.   On: 07/07/2024 08:35     Procedures   Medications Ordered in the ED - No data to display                                  Medical Decision Making Amount and/or Complexity of Data Reviewed Labs:  ordered. Radiology: ordered.   73 year old patient comes in after sustaining what appears to be a mechanical fall.  It appears patient has been having balance issues for the last several weeks and has had multiple falls.  Deborah Mullins is certain that Deborah Mullins must have struck her head at least on few occasions.  Patient does not have any vertigo.  Deborah Mullins has no other neurologic complaints associated with the intermittent ataxia.  Deborah Mullins does not have any alcohol use disorder or previous history of stroke.  Pertinent past medical includes hypertension, hyperlipidemia, diabetes. Collateral history provided by patient's husband who is at the bedside.  Based on my history and exam, differential diagnosis includes: - Traumatic brain injury including intracranial hemorrhage - Long bone fractures - Contusions - Soft tissue injury - Concussion - Subdural hematoma - Cerebellar stroke - Concussion  Based on the initial assessment, the following workup was initiated CT scan of the head, basic labs.  I discussed with the patient that Deborah Mullins likely needs to see PCP to discuss further her ataxia issues.  It does not appear that polypharmacy is the cause.  Patient indicates that Deborah Mullins has discussed this with PCP, and will be happy to see them again, but previously they were just asking her to be more careful.  Given that Deborah Mullins is having frequent falls, Deborah Mullins is at high risk for complications if Deborah Mullins ends up with traumatic injury, I will put in a neurology referral at this time for further workup to look into the ataxia.  Patient will still call her PCP.  I have independently interpreted the following imaging from the perspective of acute trauma: X-ray of the wrist and the results indicate no evidence of any fracture.  Patient already has a Velcro splint, that Deborah Mullins will continue to use.  Deborah Mullins will follow-up with orthopedist that Deborah Mullins has seen in the past if her symptoms do not improve in 2 weeks.     Final diagnoses:  Fall, initial  encounter  Progressive gait disorder  Wrist sprain, left, initial encounter    ED Discharge Orders          Ordered    Ambulatory referral to Neurology  Comments: An appointment is requested in approximately: 2 weeks   07/07/24 1019               Charlyn Sora, MD 07/07/24 1032

## 2024-07-07 NOTE — Discharge Instructions (Addendum)
 Please follow the RICE instructions provided for the wrist injury.  Given worsening balance issues and recurrent falls, we recommend that you follow-up with neurologist for additional workup.  We have put in a referral to neurology service.  Please call them on Wednesday if you do not hear from them.  Please take all necessary measures to prevent falls.

## 2024-07-07 NOTE — ED Triage Notes (Addendum)
 Pt arrived via pov to ED. Pt states over the past couple months she has been falling repeatedly. Pt states that she falls daily. Pt states that over the past couple of weeks her left wrist has been hurting worse and is requesting an EKG. Pt states PCP is aware of her condition and tells her to Just be careful. Pt states that she's tired of falling and just needs to figure out what's going on. husband at bedside.

## 2024-07-18 ENCOUNTER — Other Ambulatory Visit (HOSPITAL_COMMUNITY): Payer: Self-pay | Admitting: Nurse Practitioner

## 2024-07-18 DIAGNOSIS — S60212D Contusion of left wrist, subsequent encounter: Secondary | ICD-10-CM

## 2024-07-21 ENCOUNTER — Encounter: Payer: Self-pay | Admitting: Neurology

## 2024-09-01 ENCOUNTER — Ambulatory Visit: Admitting: Urology

## 2024-10-31 ENCOUNTER — Ambulatory Visit: Admitting: Neurology
